# Patient Record
Sex: Male | Born: 1935
Health system: Southern US, Community
[De-identification: ages and names within clinical notes are randomized; demographics above are authoritative.]

## PROBLEM LIST (undated history)

## (undated) DIAGNOSIS — K259 Gastric ulcer, unspecified as acute or chronic, without hemorrhage or perforation: Secondary | ICD-10-CM

## (undated) DIAGNOSIS — K2210 Ulcer of esophagus without bleeding: Secondary | ICD-10-CM

## (undated) DIAGNOSIS — T39395A Adverse effect of other nonsteroidal anti-inflammatory drugs [NSAID], initial encounter: Secondary | ICD-10-CM

## (undated) DIAGNOSIS — K922 Gastrointestinal hemorrhage, unspecified: Secondary | ICD-10-CM

## (undated) DIAGNOSIS — N323 Diverticulum of bladder: Secondary | ICD-10-CM

## (undated) DIAGNOSIS — Z9289 Personal history of other medical treatment: Secondary | ICD-10-CM

## (undated) DIAGNOSIS — K2960 Other gastritis without bleeding: Secondary | ICD-10-CM

## (undated) DIAGNOSIS — D649 Anemia, unspecified: Secondary | ICD-10-CM

## (undated) HISTORY — PX: COLONOSCOPY: SHX174

---

## 2012-03-25 ENCOUNTER — Emergency Department (HOSPITAL_COMMUNITY): Payer: Medicare Other

## 2012-03-25 ENCOUNTER — Emergency Department (HOSPITAL_COMMUNITY)
Admission: EM | Admit: 2012-03-25 | Discharge: 2012-03-25 | Disposition: A | Discharge status: Home / Self Care | Payer: Medicare Other | Attending: Emergency Medicine | Admitting: Emergency Medicine

## 2012-03-25 ENCOUNTER — Encounter (HOSPITAL_COMMUNITY): Payer: Self-pay | Admitting: *Deleted

## 2012-03-25 DIAGNOSIS — R05 Cough: Secondary | ICD-10-CM | POA: Insufficient documentation

## 2012-03-25 DIAGNOSIS — J3489 Other specified disorders of nose and nasal sinuses: Secondary | ICD-10-CM | POA: Insufficient documentation

## 2012-03-25 DIAGNOSIS — J069 Acute upper respiratory infection, unspecified: Secondary | ICD-10-CM

## 2012-03-25 DIAGNOSIS — R059 Cough, unspecified: Secondary | ICD-10-CM | POA: Insufficient documentation

## 2012-03-25 NOTE — ED Provider Notes (Signed)
History     CSN: [id number]  Arrival date & time 03/25/12  1327   First MD Initiated Contact with Patient 03/25/12 1420      Chief Complaint  Patient presents with  . Cough  . Nasal Congestion    (Consider location/radiation/quality/duration/timing/severity/associated sxs/prior treatment) HPI Comments: Patient comes in today with a chief complaint of nasal congestion and productive cough for the past two days.  Patient denies any shortness of breath.  Denies DOE.  He reports that he recently walked all over [place] and did not become SOB.  He denies any CP, PND, orthopnea, or peripheral edema.  No history of CHF.  Patient is a 77 y.o. male presenting with cough. The history is provided by the patient.  Cough This is a new problem. Episode onset: two days ago. The problem has been gradually worsening. The cough is productive of sputum. There has been no fever. Associated symptoms include rhinorrhea. Pertinent negatives include no chest pain, no chills, no sore throat, no shortness of breath and no wheezing. He has tried nothing for the symptoms. He is not a smoker.    History reviewed. No pertinent past medical history.  History reviewed. No pertinent past surgical history.  History reviewed. No pertinent family history.  History  Substance Use Topics  . Smoking status: Never Smoker   . Smokeless tobacco: Not on file  . Alcohol Use: No      Review of Systems  Constitutional: Negative for fever and chills.  HENT: Positive for congestion, rhinorrhea and postnasal drip. Negative for sore throat and sinus pressure.   Respiratory: Positive for cough. Negative for shortness of breath and wheezing.   Cardiovascular: Negative for chest pain, palpitations and leg swelling.  Gastrointestinal: Negative for vomiting.  Neurological: Negative for syncope.    Allergies  Review of patient's allergies indicates no known allergies.  Home Medications   Current Outpatient Rx  Name Route Sig  Dispense Refill  . BC HEADACHE POWDER PO Oral Take 1 packet by mouth daily as needed. For pain.    Marland Kitchen ALKA-SELTZER PLUS COLD PO Oral Take 1 tablet by mouth daily as needed. For cold symptoms.      BP 147/83  Pulse 89  Temp(Src) 98.6 F (37 C) (Oral)  Resp 16  SpO2 96%  Physical Exam  Nursing note and vitals reviewed. Constitutional: He appears well-developed and well-nourished. No distress.  HENT:  Head: Normocephalic and atraumatic.  Right Ear: Hearing, tympanic membrane, external ear and ear canal normal.  Left Ear: Hearing, tympanic membrane, external ear and ear canal normal.  Nose: Rhinorrhea present. Right sinus exhibits no maxillary sinus tenderness and no frontal sinus tenderness. Left sinus exhibits no maxillary sinus tenderness and no frontal sinus tenderness.  Mouth/Throat: Uvula is midline, oropharynx is clear and moist and mucous membranes are normal.  Cardiovascular: Normal rate, regular rhythm and normal heart sounds.        No lower extremity edema  Pulmonary/Chest: Effort normal and breath sounds normal. No respiratory distress. He has no wheezes. He has no rales. He exhibits no tenderness.  Neurological: He is alert.  Skin: Skin is warm and dry. He is not diaphoretic.  Psychiatric: He has a normal mood and affect.    ED Course  Procedures (including critical care time)  Labs Reviewed - No data to display Dg Chest 2 View  03/25/2012  *RADIOLOGY REPORT*  Clinical Data: Cough, nasal congestion  CHEST - 2 VIEW  Comparison: None.  Findings: The lungs  are clear.  Mediastinal contours appear normal. There is mild cardiomegaly present.  There are diffuse degenerative changes throughout the thoracic spine.  IMPRESSION: Cardiomegaly.  No active lung disease.  Original Report Authenticated By: Juline Patch, M.D.     No diagnosis found.  Discussed patient with Dr. Rosalia Hammers who also saw the patient.  MDM  Patient with productive cough and nasal congestion.  Denies CP or SOB.   CXR does not show any active lung disease.  Mild cardiomegaly.  No DOE, PND, orthopnea, peripheral edema.  Lungs CTAB.  Pulse ox 96 on RA.  Therefore, feel that patient can be discharged home with PCP follow up.        Pascal Lux Fowler, PA-C 03/26/12 0210

## 2012-03-25 NOTE — Discharge Instructions (Signed)

## 2012-03-25 NOTE — ED Notes (Signed)
To ed for eval of cough and congestion. Denies cp or sob.

## 2012-03-28 NOTE — ED Provider Notes (Signed)
Patient is 77 y.o. Male with c.o. Uri symptoms and cough.  He does not have fever or chills or dyspnea or chest pain.  NO fever and cxr clear.  Patient with lungs cta.  I agree with Ms. Zenaida Niece Wingen's assessment and plan and consulted and reviewed all informationg with her.   Hilario Quarry, MD 03/28/12 1329

## 2016-03-20 DIAGNOSIS — D649 Anemia, unspecified: Secondary | ICD-10-CM | POA: Diagnosis not present

## 2016-03-20 DIAGNOSIS — N323 Diverticulum of bladder: Secondary | ICD-10-CM | POA: Diagnosis present

## 2016-03-20 DIAGNOSIS — N390 Urinary tract infection, site not specified: Secondary | ICD-10-CM | POA: Diagnosis not present

## 2016-03-20 DIAGNOSIS — N12 Tubulo-interstitial nephritis, not specified as acute or chronic: Secondary | ICD-10-CM | POA: Diagnosis not present

## 2016-03-20 DIAGNOSIS — D62 Acute posthemorrhagic anemia: Secondary | ICD-10-CM | POA: Diagnosis not present

## 2016-03-20 DIAGNOSIS — K5790 Diverticulosis of intestine, part unspecified, without perforation or abscess without bleeding: Secondary | ICD-10-CM | POA: Diagnosis not present

## 2016-03-20 DIAGNOSIS — N329 Bladder disorder, unspecified: Secondary | ICD-10-CM | POA: Diagnosis not present

## 2016-03-20 DIAGNOSIS — K639 Disease of intestine, unspecified: Secondary | ICD-10-CM | POA: Diagnosis present

## 2016-03-20 DIAGNOSIS — R319 Hematuria, unspecified: Secondary | ICD-10-CM | POA: Diagnosis not present

## 2016-03-20 DIAGNOSIS — R309 Painful micturition, unspecified: Secondary | ICD-10-CM | POA: Diagnosis not present

## 2016-03-20 DIAGNOSIS — R195 Other fecal abnormalities: Secondary | ICD-10-CM | POA: Diagnosis not present

## 2016-03-20 DIAGNOSIS — N3289 Other specified disorders of bladder: Secondary | ICD-10-CM | POA: Diagnosis not present

## 2016-03-20 DIAGNOSIS — R310 Gross hematuria: Secondary | ICD-10-CM | POA: Diagnosis not present

## 2016-03-20 DIAGNOSIS — R030 Elevated blood-pressure reading, without diagnosis of hypertension: Secondary | ICD-10-CM | POA: Diagnosis not present

## 2016-03-20 DIAGNOSIS — B964 Proteus (mirabilis) (morganii) as the cause of diseases classified elsewhere: Secondary | ICD-10-CM | POA: Diagnosis not present

## 2016-04-01 DIAGNOSIS — N390 Urinary tract infection, site not specified: Secondary | ICD-10-CM | POA: Diagnosis not present

## 2016-04-01 DIAGNOSIS — N320 Bladder-neck obstruction: Secondary | ICD-10-CM | POA: Diagnosis not present

## 2016-04-01 DIAGNOSIS — N3090 Cystitis, unspecified without hematuria: Secondary | ICD-10-CM | POA: Diagnosis not present

## 2017-07-21 DIAGNOSIS — Z9289 Personal history of other medical treatment: Secondary | ICD-10-CM

## 2017-07-21 HISTORY — DX: Personal history of other medical treatment: Z92.89

## 2017-07-23 ENCOUNTER — Encounter (HOSPITAL_COMMUNITY): Payer: Self-pay | Admitting: Emergency Medicine

## 2017-07-23 ENCOUNTER — Inpatient Hospital Stay (HOSPITAL_COMMUNITY)
Admission: EM | Admit: 2017-07-23 | Discharge: 2017-07-26 | DRG: 378 | Disposition: A | Discharge status: Home / Self Care | Payer: Medicare Other | Attending: Oncology | Admitting: Oncology

## 2017-07-23 DIAGNOSIS — E876 Hypokalemia: Secondary | ICD-10-CM | POA: Diagnosis present

## 2017-07-23 DIAGNOSIS — K2210 Ulcer of esophagus without bleeding: Secondary | ICD-10-CM | POA: Diagnosis not present

## 2017-07-23 DIAGNOSIS — T39015A Adverse effect of aspirin, initial encounter: Secondary | ICD-10-CM | POA: Diagnosis not present

## 2017-07-23 DIAGNOSIS — Z87440 Personal history of urinary (tract) infections: Secondary | ICD-10-CM

## 2017-07-23 DIAGNOSIS — R55 Syncope and collapse: Secondary | ICD-10-CM | POA: Diagnosis not present

## 2017-07-23 DIAGNOSIS — D509 Iron deficiency anemia, unspecified: Secondary | ICD-10-CM

## 2017-07-23 DIAGNOSIS — K259 Gastric ulcer, unspecified as acute or chronic, without hemorrhage or perforation: Secondary | ICD-10-CM

## 2017-07-23 DIAGNOSIS — K250 Acute gastric ulcer with hemorrhage: Secondary | ICD-10-CM | POA: Diagnosis not present

## 2017-07-23 DIAGNOSIS — Z7982 Long term (current) use of aspirin: Secondary | ICD-10-CM

## 2017-07-23 DIAGNOSIS — R404 Transient alteration of awareness: Secondary | ICD-10-CM | POA: Diagnosis not present

## 2017-07-23 DIAGNOSIS — K922 Gastrointestinal hemorrhage, unspecified: Secondary | ICD-10-CM | POA: Diagnosis not present

## 2017-07-23 DIAGNOSIS — D500 Iron deficiency anemia secondary to blood loss (chronic): Secondary | ICD-10-CM | POA: Diagnosis not present

## 2017-07-23 DIAGNOSIS — R531 Weakness: Secondary | ICD-10-CM | POA: Diagnosis not present

## 2017-07-23 HISTORY — DX: Anemia, unspecified: D64.9

## 2017-07-23 LAB — HEPATIC FUNCTION PANEL
ALT: 8 U/L — AB (ref 17–63)
AST: 19 U/L (ref 15–41)
Albumin: 3 g/dL — ABNORMAL LOW (ref 3.5–5.0)
Alkaline Phosphatase: 64 U/L (ref 38–126)
BILIRUBIN INDIRECT: 0.5 mg/dL (ref 0.3–0.9)
BILIRUBIN TOTAL: 0.6 mg/dL (ref 0.3–1.2)
Bilirubin, Direct: 0.1 mg/dL (ref 0.1–0.5)
Total Protein: 7.2 g/dL (ref 6.5–8.1)

## 2017-07-23 LAB — IRON AND TIBC
IRON: 10 ug/dL — AB (ref 45–182)
SATURATION RATIOS: 3 % — AB (ref 17.9–39.5)
TIBC: 326 ug/dL (ref 250–450)
UIBC: 316 ug/dL

## 2017-07-23 LAB — BASIC METABOLIC PANEL
ANION GAP: 7 (ref 5–15)
BUN: 7 mg/dL (ref 6–20)
CHLORIDE: 108 mmol/L (ref 101–111)
CO2: 25 mmol/L (ref 22–32)
Calcium: 8.7 mg/dL — ABNORMAL LOW (ref 8.9–10.3)
Creatinine, Ser: 1.12 mg/dL (ref 0.61–1.24)
GFR calc Af Amer: >60 mL/min (ref >60)
GFR calc non Af Amer: >60 mL/min (ref >60)
GLUCOSE: 125 mg/dL — AB (ref 65–99)
POTASSIUM: 3.3 mmol/L — AB (ref 3.5–5.1)
Sodium: 140 mmol/L (ref 135–145)

## 2017-07-23 LAB — URINALYSIS, ROUTINE W REFLEX MICROSCOPIC
Bilirubin Urine: NEGATIVE
Glucose, UA: NEGATIVE mg/dL
Hgb urine dipstick: NEGATIVE
KETONES UR: NEGATIVE mg/dL
NITRITE: NEGATIVE
PROTEIN: NEGATIVE mg/dL
Specific Gravity, Urine: 1.017 (ref 1.005–1.030)
pH: 5 (ref 5.0–8.0)

## 2017-07-23 LAB — CBC
HEMATOCRIT: 23.1 % — AB (ref 39.0–52.0)
HEMOGLOBIN: 6.2 g/dL — AB (ref 13.0–17.0)
MCH: 16.1 pg — AB (ref 26.0–34.0)
MCHC: 26.8 g/dL — AB (ref 30.0–36.0)
MCV: 59.8 fL — AB (ref 78.0–100.0)
Platelets: 343 10*3/uL (ref 150–400)
RBC: 3.86 MIL/uL — ABNORMAL LOW (ref 4.22–5.81)
RDW: 19.3 % — ABNORMAL HIGH (ref 11.5–15.5)
WBC: 7.6 10*3/uL (ref 4.0–10.5)

## 2017-07-23 LAB — PROTIME-INR
INR: 1.09
Prothrombin Time: 14.1 seconds (ref 11.4–15.2)

## 2017-07-23 LAB — POC OCCULT BLOOD, ED: FECAL OCCULT BLD: POSITIVE — AB

## 2017-07-23 LAB — CBG MONITORING, ED: GLUCOSE-CAPILLARY: 117 mg/dL — AB (ref 65–99)

## 2017-07-23 LAB — PREPARE RBC (CROSSMATCH)

## 2017-07-23 LAB — FERRITIN: FERRITIN: 4 ng/mL — AB (ref 24–336)

## 2017-07-23 LAB — ABO/RH: ABO/RH(D): O POS

## 2017-07-23 LAB — I-STAT TROPONIN, ED: Troponin i, poc: 0.01 ng/mL (ref 0.00–0.08)

## 2017-07-23 LAB — LIPASE, BLOOD: Lipase: 22 U/L (ref 11–51)

## 2017-07-23 LAB — APTT: aPTT: 27 seconds (ref 24–36)

## 2017-07-23 MED ORDER — SODIUM CHLORIDE 0.9 % IV BOLUS (SEPSIS)
1000.0000 mL | Freq: Once | INTRAVENOUS | Status: AC
  Administered 2017-07-23: 1000 mL via INTRAVENOUS

## 2017-07-23 MED ORDER — SODIUM CHLORIDE 0.9% FLUSH
3.0000 mL | Freq: Two times a day (BID) | INTRAVENOUS | Status: DC
  Administered 2017-07-24 – 2017-07-26 (×4): 3 mL via INTRAVENOUS

## 2017-07-23 MED ORDER — SODIUM CHLORIDE 0.9 % IV SOLN
INTRAVENOUS | Status: DC
  Administered 2017-07-24 – 2017-07-25 (×2): via INTRAVENOUS

## 2017-07-23 MED ORDER — SODIUM CHLORIDE 0.9 % IV SOLN
8.0000 mg/h | INTRAVENOUS | Status: DC
  Administered 2017-07-23: 8 mg/h via INTRAVENOUS
  Filled 2017-07-23: qty 80

## 2017-07-23 MED ORDER — PANTOPRAZOLE SODIUM 40 MG IV SOLR
40.0000 mg | Freq: Two times a day (BID) | INTRAVENOUS | Status: AC
  Administered 2017-07-23 – 2017-07-25 (×4): 40 mg via INTRAVENOUS
  Filled 2017-07-23 (×4): qty 40

## 2017-07-23 MED ORDER — SODIUM CHLORIDE 0.9 % IV SOLN: Freq: Once | INTRAVENOUS | Status: DC

## 2017-07-23 MED ORDER — PANTOPRAZOLE SODIUM 40 MG IV SOLR
40.0000 mg | Freq: Once | INTRAVENOUS | Status: AC
  Administered 2017-07-23: 40 mg via INTRAVENOUS
  Filled 2017-07-23: qty 40

## 2017-07-23 NOTE — ED Notes (Signed)
Attempted to call report

## 2017-07-23 NOTE — ED Provider Notes (Signed)
[facility] DEPT Provider Note   CSN: [id number] Arrival date & time: 07/23/17  1317     History   Chief Complaint Chief Complaint  Patient presents with  . Loss of Consciousness    HPI Roberto Owen is a 82 y.o. male who presents emergency Department with chief complaint syncope. The patient states that he was eating with a friend at [employer] today when he started to feel very dizzy. He states that he put his head down on the table and the next thing he knows he ended up on the floor with 3 of the managers standing around him in a crowd of people. He states he has had a syncopal event in the past. He denies racing or skipping in his heart, chest pain, shortness of breath. He is otherwise extremely active, he walks every day to his favorite restaurant for lunch and does yard work and housework. The patient also states that he has a previous history of a GI bleed about 6 years ago in [place]. He was admitted and transfused and had a workup that revealed no area of bleeding in the cut. He states that he took iron and has been well since that time. Patient states he takes no medicine except for Goody's powders which she takes 2 daily every day. He states that his parents lived into their 23s and they did the same and he continues to plan on taking it because it makes him feel better.  HPI  History reviewed. No pertinent past medical history.  There are no active problems to display for this patient.   History reviewed. No pertinent surgical history.     Home Medications    Prior to Admission medications   Medication Sig Start Date End Date Taking? Authorizing Provider  Aspirin-Salicylamide-Caffeine (BC HEADACHE POWDER PO) Take 1 packet by mouth daily as needed. For pain.    [provider]  Chlorphen-Phenyleph-ASA (ALKA-SELTZER PLUS COLD PO) Take 1 tablet by mouth daily as needed. For cold symptoms.    [provider]    Family History No family history  on file.  Social History Social History  Substance Use Topics  . Smoking status: Never Smoker  . Smokeless tobacco: Not on file  . Alcohol use No     Allergies   Patient has no known allergies.   Review of Systems Review of Systems  Ten systems reviewed and are negative for acute change, except as noted in the HPI.   Physical Exam Updated Vital Signs BP 140/73   Pulse 69   Resp 20   SpO2 100%   Physical Exam  Constitutional: He is oriented to person, place, and time. He appears well-developed and well-nourished. No distress.  HENT:  Head: Normocephalic and atraumatic.  Pale conjunctiva  Eyes: Conjunctivae are normal. No scleral icterus.  Neck: Normal range of motion. Neck supple.  Cardiovascular: Normal rate, regular rhythm and normal heart sounds.   Pulmonary/Chest: Effort normal and breath sounds normal. No respiratory distress.  Abdominal: Soft. He exhibits no distension. There is no tenderness.  Musculoskeletal: He exhibits no edema.  Neurological: He is alert and oriented to person, place, and time.  Skin: Skin is warm and dry. He is not diaphoretic.  Psychiatric: His behavior is normal.  Nursing note and vitals reviewed.    ED Treatments / Results  Labs (all labs ordered are listed, but only abnormal results are displayed) Labs Reviewed  BASIC METABOLIC PANEL - Abnormal; Notable for the following:  Result Value   Potassium 3.3 (*)    Glucose, Bld 125 (*)    Calcium 8.7 (*)    All other components within normal limits  CBC - Abnormal; Notable for the following:    RBC 3.86 (*)    Hemoglobin 6.2 (*)    HCT 23.1 (*)    MCV 59.8 (*)    MCH 16.1 (*)    MCHC 26.8 (*)    RDW 19.3 (*)    All other components within normal limits  HEPATIC FUNCTION PANEL - Abnormal; Notable for the following:    Albumin 3.0 (*)    ALT 8 (*)    All other components within normal limits  CBG MONITORING, ED - Abnormal; Notable for the following:    Glucose-Capillary  117 (*)    All other components within normal limits  LIPASE, BLOOD  URINALYSIS, ROUTINE W REFLEX MICROSCOPIC  CBG MONITORING, ED  I-STAT TROPONIN, ED    EKG  EKG Interpretation  Date/Time:  Friday July 23 2017 13:26:47 EDT Ventricular Rate:  80 PR Interval:    QRS Duration: 87 QT Interval:  440 QTC Calculation: 447 R Axis:   13 Text Interpretation:  Atrial-paced complexes Ventricular bigeminy Borderline T abnormalities, inferior leads No STEMI.  Confirmed by Nanda Quinton [phone]) on 07/23/2017 1:32:35 PM Also confirmed by Nanda Quinton [phone]), editor Laurena Spies [phone])  on 07/23/2017 1:42:57 PM       Radiology No results found.  Procedures .Critical Care Performed by: Margarita Mail Authorized by: Margarita Mail   Critical care provider statement:    Critical care time (minutes):  60   Critical care was necessary to treat or prevent imminent or life-threatening deterioration of the following conditions:  Circulatory failure   Critical care was time spent personally by me on the following activities:  Development of treatment plan with patient or surrogate, discussions with consultants, evaluation of patient's response to treatment, examination of patient, interpretation of cardiac output measurements, obtaining history from patient or surrogate, ordering and performing treatments and interventions, ordering and review of laboratory studies, ordering and review of radiographic studies, pulse oximetry and re-evaluation of patient's condition   (including critical care time)  Medications Ordered in ED Medications - No data to display   Initial Impression / Assessment and Plan / ED Course  I have reviewed the triage vital signs and the nursing notes.  Pertinent labs & imaging results that were available during my care of the patient were reviewed by me and considered in my medical decision making (see chart for details).     Patient with active GI bleed, fecal  occult test is positive. Patient begun on Protonix bolus and drip, made nothing by mouth. Type and screen with transfusion ordered. I have placed a consult to gi and patient will need admission. Pt stable in ED with no significant deterioration in condition.   Final Clinical Impressions(s) / ED Diagnoses   Final diagnoses:  Gastrointestinal hemorrhage, unspecified gastrointestinal hemorrhage type  Syncope and collapse    New Prescriptions New Prescriptions   No medications on file     Margarita Mail, PA-C 07/23/17 1716    Margette Fast, MD 07/23/17 [phone]

## 2017-07-23 NOTE — H&P (Signed)
Date: 07/23/2017               Patient Name:  Roberto Owen MRN: [id number]  DOB: 21-May-1935 Age / Sex: 82 y.o., male   PCP: System, Pcp Not In         Medical Service: Internal Medicine Teaching Service         Attending Physician: Dr. Oval Linsey, MD    First Contact: Dr. Johny Chess Pager: [phone]  Second Contact: Dr. Juleen China Pager: [phone]       After Hours (After 5p/  First Contact Pager: [phone]  weekends / holidays): Second Contact Pager: [phone]   Chief Complaint: Syncopal episode   History of Present Illness: Roberto Owen is an 82 yo M with history of anemia who presented to the ED after a syncopal event and found to be anemic.   He was eating at a large meal at a restaurant when, while sitting, he became "woozy and tired", felt light-headed and briefly lost consciousness. He reports waking up without any confusion, had one episode of non-bloody, non-bilious emesis and had resolution of his symptoms. Denies palpitations leading to event, denies sx beginning with a postural change. There were no reported jerking movements by witnesses. Overall he reports being in good health except when he does not eat regularly- he typically only eats out at restaurants or when someone cooks for him. He has not had dark or bloody bowel movements, no hematuria. He states he tries to walk regularly and exercise. Prior to moving to [facility] around Oct/Nov 2017, he was walking around 2 miles per day and playing golf and he notes he has not been as active since his move but attributes this to only when he skips a meal. He takes [employer] Earing Powders twice a day starting around 25 years ago.   Of note, he states around 1 yr ago while living in [place] he was hospitalized for a UTI and had an episode of gross hematuria. He was found to be anemic at that time, did not require transfusion. Per the pt and daughter, he had a negative workup including colonoscopy and other testing that was negative. He was  discharged on a course of Fe and states he was told his anemia had resolved on follow up.   In the ED, HR 83, BP 156/73, 99% on RA. Labs were remarkable for Hgb 6.2, MCV 59.8, WBC 7.6, Plts 343. Na 140, K 3.3, Cl 108, CO2 125, BUN 7, Cr 1.12. Alb 3.0, TP 7.2, AST/ALT wnl. Fecal occult blood test was positive. He was type and screened and transfused 2 U pRBCs. He was started on IV PPI and IVF, GI was consulted and he was admitted for further management.      Meds:  Current Meds  Medication Sig  . Aspirin-Salicylamide-Caffeine (BC HEADACHE POWDER PO) Take 1 packet by mouth 2 (two) times daily. For pain.   . Multiple Vitamins-Minerals (ADULT ONE DAILY GUMMIES) CHEW Chew 2 tablets by mouth daily.     Allergies: Allergies as of 07/23/2017  . (No Known Allergies)   Past Medical History:  Diagnosis Date  . Anemia     Family History:  Family History  Problem Relation Age of Onset  . Arthritis Mother   . Arthritis Father   . Heart disease Neg Hx   . Kidney disease Neg Hx   . Diabetes Neg Hx      Social History:  Social History  Substance Use Topics  . Smoking status:  Never Smoker  . Smokeless tobacco: Not on file  . Alcohol use No     Review of Systems: A complete ROS was negative except as per HPI.   Physical Exam: Blood pressure (!) 140/95, pulse 80, resp. rate (!) 25, SpO2 99 %. Physical Exam  Constitutional: He is oriented to person, place, and time. He appears well-developed and well-nourished.  Elderly gentleman resting in bed in no acute distress   HENT:  Head: Normocephalic and atraumatic.  Mouth/Throat: Oropharynx is clear and moist.  Poor dentition   Eyes: Pupils are equal, round, and reactive to light. EOM are normal.  Pale conjuctiva  Neck: Neck supple. No tracheal deviation present.  Cardiovascular: Normal rate, regular rhythm, normal heart sounds and intact distal pulses.   Pulmonary/Chest: Effort normal and breath sounds normal. No respiratory distress.  He has no wheezes.  Abdominal: Soft. Bowel sounds are normal. He exhibits no distension. There is no tenderness. There is no guarding.  Musculoskeletal: He exhibits no edema.  Lymphadenopathy:    He has no cervical adenopathy.  Neurological: He is alert and oriented to person, place, and time. No cranial nerve deficit. He exhibits normal muscle tone.     EKG: personally reviewed my interpretation is ventricular bigeminy without evidence of ischemia.    Assessment & Plan by Problem:  1.Suspected GI bleed of unknown source with severe anemia Syncopal episode   Pt presenting s/p syncopal episode and found to have severe anemia, Hgb 6.2, and positive occult blood test. He is hemodynamically stable with no specific complaints. He has had a workup for anemia in the past, though details unavailable pt reports normal colonoscopy. At this time, syncopal episode and anemia due to blood loss from a GI source is most likely explanation for his current presentation, especially with hx of significant use of Goody powder. Syncope may have also been vasovagal with preceding prodrome. GI consulted, appreciate their recommendations. Received 2 U pRBCs in ED.  --2 IVs, Type and Cross, transfuse for Hgb <7, monitor vital signs  --IV Pantoprazole 40 mg BID  --IVF --Iron Studies --H&H post transfusion  --NPO midnight, EGD tomorrow  --Orthostatic vitals       Dispo: Admit patient to Observation with expected length of stay less than 2 midnights.  Signed: Tawny Asal, MD 07/23/2017, 6:17 PM  Pager: [phone]

## 2017-07-23 NOTE — ED Triage Notes (Signed)
Pt BIB EMS from [facility] for syncopal episode. Per EMS pt became weak, diaphoretic, and had LOC while sitting at the table; when pt came to, had an episode of vomit and reported lower abd pain. Pt A&Ox4; resp e/u; No injuries and nad at this time.   Pt states he hasn't been eating well or having an appetite.

## 2017-07-24 ENCOUNTER — Encounter (HOSPITAL_COMMUNITY): Payer: Self-pay | Admitting: *Deleted

## 2017-07-24 DIAGNOSIS — K250 Acute gastric ulcer with hemorrhage: Secondary | ICD-10-CM | POA: Diagnosis not present

## 2017-07-24 DIAGNOSIS — D500 Iron deficiency anemia secondary to blood loss (chronic): Secondary | ICD-10-CM | POA: Diagnosis not present

## 2017-07-24 DIAGNOSIS — Z791 Long term (current) use of non-steroidal anti-inflammatories (NSAID): Secondary | ICD-10-CM

## 2017-07-24 DIAGNOSIS — T39015A Adverse effect of aspirin, initial encounter: Secondary | ICD-10-CM | POA: Diagnosis present

## 2017-07-24 DIAGNOSIS — T39395S Adverse effect of other nonsteroidal anti-inflammatory drugs [NSAID], sequela: Secondary | ICD-10-CM | POA: Diagnosis not present

## 2017-07-24 DIAGNOSIS — K228 Other specified diseases of esophagus: Secondary | ICD-10-CM | POA: Diagnosis not present

## 2017-07-24 DIAGNOSIS — R55 Syncope and collapse: Secondary | ICD-10-CM | POA: Diagnosis not present

## 2017-07-24 DIAGNOSIS — Z9889 Other specified postprocedural states: Secondary | ICD-10-CM | POA: Diagnosis not present

## 2017-07-24 DIAGNOSIS — K219 Gastro-esophageal reflux disease without esophagitis: Secondary | ICD-10-CM | POA: Diagnosis not present

## 2017-07-24 DIAGNOSIS — K2951 Unspecified chronic gastritis with bleeding: Secondary | ICD-10-CM | POA: Diagnosis not present

## 2017-07-24 DIAGNOSIS — B9681 Helicobacter pylori [H. pylori] as the cause of diseases classified elsewhere: Secondary | ICD-10-CM | POA: Diagnosis not present

## 2017-07-24 DIAGNOSIS — R195 Other fecal abnormalities: Secondary | ICD-10-CM | POA: Diagnosis not present

## 2017-07-24 DIAGNOSIS — Z87440 Personal history of urinary (tract) infections: Secondary | ICD-10-CM | POA: Diagnosis not present

## 2017-07-24 DIAGNOSIS — K2210 Ulcer of esophagus without bleeding: Secondary | ICD-10-CM | POA: Diagnosis not present

## 2017-07-24 DIAGNOSIS — E876 Hypokalemia: Secondary | ICD-10-CM | POA: Diagnosis not present

## 2017-07-24 DIAGNOSIS — Z7982 Long term (current) use of aspirin: Secondary | ICD-10-CM | POA: Diagnosis not present

## 2017-07-24 DIAGNOSIS — D649 Anemia, unspecified: Secondary | ICD-10-CM | POA: Diagnosis not present

## 2017-07-24 DIAGNOSIS — K259 Gastric ulcer, unspecified as acute or chronic, without hemorrhage or perforation: Secondary | ICD-10-CM | POA: Diagnosis not present

## 2017-07-24 DIAGNOSIS — K922 Gastrointestinal hemorrhage, unspecified: Secondary | ICD-10-CM | POA: Diagnosis not present

## 2017-07-24 LAB — CBC
HCT: 24.3 % — ABNORMAL LOW (ref 39.0–52.0)
HCT: 27.5 % — ABNORMAL LOW (ref 39.0–52.0)
HCT: 25.3 % — ABNORMAL LOW (ref 39.0–52.0)
HEMOGLOBIN: 7.3 g/dL — AB (ref 13.0–17.0)
Hemoglobin: 7 g/dL — ABNORMAL LOW (ref 13.0–17.0)
Hemoglobin: 7.9 g/dL — ABNORMAL LOW (ref 13.0–17.0)
MCH: 18.4 pg — AB (ref 26.0–34.0)
MCH: 18.5 pg — ABNORMAL LOW (ref 26.0–34.0)
MCH: 18.5 pg — AB (ref 26.0–34.0)
MCHC: 28.8 g/dL — AB (ref 30.0–36.0)
MCHC: 28.7 g/dL — AB (ref 30.0–36.0)
MCHC: 28.9 g/dL — ABNORMAL LOW (ref 30.0–36.0)
MCV: 63.9 fL — AB (ref 78.0–100.0)
MCV: 64.3 fL — ABNORMAL LOW (ref 78.0–100.0)
MCV: 64.2 fL — ABNORMAL LOW (ref 78.0–100.0)
PLATELETS: 241 10*3/uL (ref 150–400)
PLATELETS: 258 10*3/uL (ref 150–400)
PLATELETS: 271 10*3/uL (ref 150–400)
RBC: 3.8 MIL/uL — ABNORMAL LOW (ref 4.22–5.81)
RBC: 4.28 MIL/uL (ref 4.22–5.81)
RBC: 3.94 MIL/uL — AB (ref 4.22–5.81)
RDW: 22.8 % — AB (ref 11.5–15.5)
RDW: 22.8 % — AB (ref 11.5–15.5)
RDW: 22.6 % — ABNORMAL HIGH (ref 11.5–15.5)
WBC: 5 10*3/uL (ref 4.0–10.5)
WBC: 4.8 10*3/uL (ref 4.0–10.5)
WBC: 5.2 10*3/uL (ref 4.0–10.5)

## 2017-07-24 LAB — BASIC METABOLIC PANEL
Anion gap: 4 — ABNORMAL LOW (ref 5–15)
BUN: 6 mg/dL (ref 6–20)
CALCIUM: 8 mg/dL — AB (ref 8.9–10.3)
CHLORIDE: 109 mmol/L (ref 101–111)
CO2: 27 mmol/L (ref 22–32)
CREATININE: 0.84 mg/dL (ref 0.61–1.24)
GFR calc non Af Amer: >60 mL/min (ref >60)
Glucose, Bld: 90 mg/dL (ref 65–99)
Potassium: 3.4 mmol/L — ABNORMAL LOW (ref 3.5–5.1)
Sodium: 140 mmol/L (ref 135–145)

## 2017-07-24 LAB — PREPARE RBC (CROSSMATCH)

## 2017-07-24 MED ORDER — SODIUM CHLORIDE 0.9 % IV SOLN: Freq: Once | INTRAVENOUS | Status: DC

## 2017-07-24 MED ORDER — POLYVINYL ALCOHOL 1.4 % OP SOLN
1.0000 [drp] | OPHTHALMIC | Status: DC | PRN
  Administered 2017-07-24 – 2017-07-26 (×3): 1 [drp] via OPHTHALMIC
  Filled 2017-07-24: qty 15

## 2017-07-24 NOTE — Consult Note (Signed)
Attending physician's note   I have taken a history, examined the patient and reviewed the chart. I agree with the Advanced Practitioner's note, impression and recommendations.  82 year old male with history of iron deficiency anemia admitted with presyncope, worsening anemia and heme positive stool. Per patient he had EGD, colonoscopy and extensive evaluation including urologic for iron deficiency anemia. Await reports from [facility].  Patient denies any overt GI bleed. He has history of chronic NSAID use. We will plan for EGD tomorrow morning to exclude gastric/peptic ulcer disease Continue to monitor hemoglobin every 12 hours and transfuse to maintain hemoglobin greater than 7 Clear liquids and nothing by mouth after midnight Continue PPI twice daily  K Denzil Magnuson, MD [phone] Mon-Fri 8a-5p [phone] after 5p, weekends, holidays  Referring Provider: Internal Medicine Teaching Service  Primary Care Physician:  System, Pcp Not In Primary Gastroenterologist:   Unassigned  Reason for Consultation:  Anemia, heme positive stools  ASSESSMENT AND PLAN:    1. 82 yo male with symptomatic iron deficiency anemia, heme positive stools in setting of chronic BID NSAIDs. Hgb 6.2. No overt GI bleeding. No GI symptoms. Sounds like he underwent inpatient anemia workup in [place] 11 months ago. He describes negative EGD and colonoscopy. Records requested. -discussed potential side effects of BC powders with patient and daughter.  -EGD tomorrow. The risks and benefits of EGD were discussed and the patient agrees to proceed.  -Await records from [place]. If colonoscopy was complete with well prepped colon then repeat colonoscopy shouldn't be necessary. He may need small bowel evaluation at some point.  -hgb up less than a gram after 2 units of blood. He may need another unit.    HPI: Roberto Owen is a 82 y.o. male from [place] in [place] visiting daughter. who presented to ED  yesterday afternoon after ? syncopal episode. Patient had eaten a large meal, subsequently felt weak. He put his head down on the table, EMS was called. Patient says he remembers everything and didn't lose consciousness. No SOB or chest pain. In ED he was found to have a hgb of 6.2. FOBT +. Given 2 units of blood.   Patient [place]op[place] in [place]/ hematuria. Found to be anemic at the time. Apparently had a negative colonoscopy and EGD in [place] 11 months while hospitalized with urinary sx. Apparently anemic as well given endoscopic procedures. Patient says he was treated with some anemia pills and at time of check up a few weeks later everything was fine. He has taken [facility] powders twice daily for 25 years. He has not had any overt GI bleeding. No abdominal pain. No nausea / no vomiting. No weight loss.    Past Medical History:  Diagnosis Date  . Anemia     History reviewed. No pertinent surgical history.  Prior to Admission medications   Medication Sig Start Date End Date Taking? Authorizing Provider  Aspirin-Salicylamide-Caffeine (BC HEADACHE POWDER PO) Take 1 packet by mouth 2 (two) times daily. For pain.    Yes [provider]  Multiple Vitamins-Minerals (ADULT ONE DAILY GUMMIES) CHEW Chew 2 tablets by mouth daily.   Yes [provider]    Current Facility-Administered Medications  Medication Dose Route Frequency Provider Last Rate Last Dose  . 0.9 %  sodium chloride infusion   Intravenous Continuous Jule Ser, DO      . 0.9 %  sodium chloride infusion   Intravenous Once Margarita Mail, PA-C      .  pantoprazole (PROTONIX) injection 40 mg  40 mg Intravenous Q12H Jule Ser, DO   40 mg at 07/23/17 2030  . polyvinyl alcohol (LIQUIFILM TEARS) 1.4 % ophthalmic solution 1 drop  1 drop Both Eyes PRN Oval Linsey, MD   1 drop at 07/24/17 0509  . sodium chloride flush (NS) 0.9 % injection 3 mL  3 mL Intravenous Q12H Jule Ser, DO         Allergies as of 07/23/2017  . (No Known Allergies)    Family History  Problem Relation Age of Onset  . Arthritis Mother   . Arthritis Father   . Heart disease Neg Hx   . Kidney disease Neg Hx   . Diabetes Neg Hx     Social History   Social History  . Marital status: Divorced    Spouse name: N/A  . Number of children: N/A  . Years of education: N/A   Occupational History  . Not on file.   Social History Main Topics  . Smoking status: Never Smoker  . Smokeless tobacco: Never Used  . Alcohol use No  . Drug use: No  . Sexual activity: Not on file   Other Topics Concern  . Not on file   Social History Narrative  . No narrative on file    Review of Systems: All systems reviewed and negative except where noted in HPI.  Physical Exam: Vital signs in last 24 hours: Temp:  [98 F (36.7 C)-98.6 F (37 C)] 98.6 F (37 C) (08/04 0506) Pulse Rate:  [37-102] 77 (08/04 0506) Resp:  [15-25] 20 (08/04 0506) BP: (111-150)/(48-95) 134/83 (08/04 0506) SpO2:  [97 %-100 %] 100 % (08/04 0506) Weight:  [223 lb 4.8 oz (101.3 kg)] 223 lb 4.8 oz (101.3 kg) (08/03 1847) Last BM Date: 07/23/17 General:   Alert, well-developed,  Black male in NAD Psych:  Pleasant, cooperative. Normal mood and affect. Eyes:  Pupils equal, sclera clear, no icterus.   Conjunctiva pink. Ears:  Normal auditory acuity. Nose:  No deformity, discharge,  or lesions. Neck:  Supple; no masses Lungs:  Clear throughout to auscultation.   No wheezes, crackles, or rhonchi.  Heart:  Regular rate, irreg rhythm; no murmurs Abdomen:  Soft, non-distended, nontender, BS active, no palp mass    Rectal:  Deferred  Msk:  Symmetrical without gross deformities. . Pulses:  Normal pulses noted. Neurologic:  Alert and  oriented x4;  grossly normal neurologically. Skin:  Intact without significant lesions or rashes..   Intake/Output from previous day: 08/03 0701 - 08/04 0700 In: 2981.7 [I.V.:58.3; Blood:690; IV  Piggyback:2233.3] Out: 400 [Urine:400] Intake/Output this shift: No intake/output data recorded.  Lab Results:  Recent Labs  07/23/17 1328 07/24/17 0711  WBC 7.6 5.0  HGB 6.2* 7.0*  HCT 23.1* 24.3*  PLT 343 241   BMET  Recent Labs  07/23/17 1328 07/24/17 0711  NA 140 140  K 3.3* 3.4*  CL 108 109  CO2 25 27  GLUCOSE 125* 90  BUN 7 6  CREATININE 1.12 0.84  CALCIUM 8.7* 8.0*   LFT  Recent Labs  07/23/17 1356  PROT 7.2  ALBUMIN 3.0*  AST 19  ALT 8*  ALKPHOS 64  BILITOT 0.6  BILIDIR 0.1  IBILI 0.5   PT/INR  Recent Labs  07/23/17 1328  LABPROT 14.1  INR 1.09    Tye Savoy, NP-C @  07/24/2017, 9:29 AM  Pager number [phone]

## 2017-07-24 NOTE — Progress Notes (Signed)
   Subjective: Roberto Owen was admitted yesterday, received 2 U pRBCs overnight, no acute events. He reports feeling well overall with no complaints this morning and notes he has enjoyed the friendly staff.   Objective:  Vital signs in last 24 hours: Vitals:   07/23/17 2138 07/24/17 0109 07/24/17 0143 07/24/17 0506  BP: (!) 139/92 (!) 138/52 (!) 150/61 134/83  Pulse: 80 69 82 77  Resp: 16 18 18 20   Temp: 98.4 F (36.9 C) 98.4 F (36.9 C) 98.1 F (36.7 C) 98.6 F (37 C)  TempSrc: Oral Oral Oral Oral  SpO2: 100% 100% 100% 100%  Weight:      Height:       Physical Exam  Constitutional: He is oriented to person, place, and time. He appears well-developed and well-nourished.  Elderly gentleman comfortably resting in bed in no acute distress   HENT:  Head: Normocephalic and atraumatic.  Cardiovascular: Normal rate and regular rhythm.   Pulmonary/Chest: Effort normal. No respiratory distress.  Abdominal: Soft. He exhibits no distension. There is no tenderness. There is no guarding.  Musculoskeletal: Normal range of motion.  Neurological: He is alert and oriented to person, place, and time.  Skin: Skin is warm and dry.     Assessment/Plan:  1.Suspected GI bleed of unknown source with severe anemia Syncopal episode   Pt presenting s/p syncopal episode and found to have severe anemia, Hgb 6.2, and positive occult blood test with significant Goody powder use. He is hemodynamically stable with no specific complaints. He has had a workup for anemia in the past, though details unavailable pt reports normal colonoscopy. Syncopal episode may have been vasovagal in nature with reported prodrome, following large meal. GI consulted, appreciate their recommendations. Received 2 U pRBCs with increase in Hgb to 7.0. Iron studies consistent with IDA. This is likely a chronic process rather than acute.   --2 IVs, Type and Cross, transfuse for Hgb <7, monitor vital signs  --CBC q8hr  --Continue IV  Pantoprazole 40 mg BID  --Continue IVF 75 ml/hr --F/u EGD results and GI recommendations --Attempt to obtain outside records of prior workup   Dispo: Anticipated discharge in approximately 1-2 day(s).   Tawny Asal, MD 07/24/2017, 10:34 AM Pager: [phone]

## 2017-07-24 NOTE — H&P (Signed)
Internal Medicine Attending Admission Note Date: 07/24/2017  Patient name: Roberto Owen Medical record number: [id number] Date of birth: [birth year] Age: 82 y.o. Gender: male  I saw and evaluated the patient. I reviewed the resident's note and I agree with the resident's findings and plan as documented in the resident's note.  Chief Complaint(s): Syncope.  History - key components related to admission:  Mr. Roberto Owen is an 82 year old man with a previous history of anemia diagnosed one year ago who was in his usual state of health until the evening of admission when, while sitting after eating a big meal at [facility], he felt fatigued, lightheadedness and briefly lost consciousness. Upon awakening he was not confused but did have an episode of nonbloody and non-bilious emesis. Just prior to the event he denies feeling any chest pain, palpitations, shortness of breath, or headaches. In the emergency department he was noted to have a hemoglobin of 6.2 with an MCV of 59.8 and a fecal occult blood test card that was positive. He was therefore admitted to the internal medicine teaching service for further evaluation and care.  When seen on rounds the morning after admission he had absolutely no complaints and stated he felt fine. He did fill in some prior history that approximately one year ago he had a similar episode and was found to be anemic. He apparently underwent a colonoscopy and a cystoscopy, both of which he tells [place] were unremarkable. He was placed on iron supplementation and one month later was told he was fine. Also of historical importance is the fact that he's been taking Goody powders twice daily for nearly 25 years.  Physical Exam - key components related to admission:  Vitals:   07/24/17 0109 07/24/17 0143 07/24/17 0506 07/24/17 1353  BP: (!) 138/52 (!) 150/61 134/83 95/72  Pulse: 69 82 77 66  Resp: 18 18 20 14   Temp: 98.4 F (36.9 C) 98.1 F (36.7 C) 98.6 F (37 C) 97.9 F  (36.6 C)  TempSrc: Oral Oral Oral Oral  SpO2: 100% 100% 100% 99%  Weight:      Height:       Gen.: Well-developed, well-nourished, man lying comfortably in bed in no acute distress. Abdomen: Soft, nontender  Lab results:  Basic Metabolic Panel:  Recent Labs  07/23/17 1328 07/24/17 0711  NA 140 140  K 3.3* 3.4*  CL 108 109  CO2 25 27  GLUCOSE 125* 90  BUN 7 6  CREATININE 1.12 0.84  CALCIUM 8.7* 8.0*   Liver Function Tests:  Recent Labs  07/23/17 1356  AST 19  ALT 8*  ALKPHOS 64  BILITOT 0.6  PROT 7.2  ALBUMIN 3.0*    Recent Labs  07/23/17 1356  LIPASE 22   CBC:  Recent Labs  07/24/17 0711 07/24/17 1310  WBC 5.0 4.8  HGB 7.0* 7.9*  HCT 24.3* 27.5*  MCV 63.9* 64.3*  PLT 241 258   CBG:  Recent Labs  07/23/17 1333  GLUCAP 117*   Anemia Panel:  Recent Labs  07/23/17 1807  FERRITIN 4*  TIBC 326  IRON 10*   Coagulation:  Recent Labs  07/23/17 1328  INR 1.09   Urinalysis:  Clear, yellow, specific gravity 1.017, pH 5.0, negative protein, negative nitrate, large leukocytes, 0-5 red blood cells per high-power field, 6-30 white blood cells per high-power field.  Misc. Labs:  Fecal occult blood test positive  Other results:  PIR:JJOACZYSAY reviewed. Normal sinus rhythm at 80 bpm with ventricular bigeminy, normal axis,  normal intervals, no significant Q waves, no LVH by voltage, good R wave progression, inferior T wave flattening, no comparisons immediately available.  Assessment & Plan by Problem:  Roberto Owen is an 82 year old man with a previous history of anemia diagnosed one year ago who was in his usual state of health until the evening of admission when, while sitting after eating a big meal at [facility], he felt fatigued, lightheadedness and briefly lost consciousness. When seen in the emergency department he was noted to be significantly anemic with a hemoglobin of 6.2 and a positive fecal occult blood test card. He has not  had any signs or symptoms of an acute GI bleed or blood loss elsewhere. Given the iron deficiency, very low MCV and history of anemia approximately one year ago, I suspect this is a chronic process most likely related to a gastritis from the continuous Goody powder use.  1) Symptomatic anemia likely related to aspirin use: He has been transfused and will be followed with serial hemoglobins. The target is to keep the hemoglobin above 7. He is nothing by mouth in preparation for an EGD. He has an active type and cross in the lab and has 2 wide bore IVs. We will continue with the IV PPI therapy.  2) Syncope: Likely related to symptomatic anemia rather than a cardiac or neurologic cause. Therefore, we are pursuing a GI workup at this time.  3) Disposition: I agree he will be ready for discharge home after his EGD in the morning assuming his hemoglobin remains stable. I would not be surprised if the EGD was not necessarily diagnostic and he would therefore require further evaluation which could include a capsule endoscopy if we can get the records of the colonoscopy he had done one year ago to assure it was negative as he says.

## 2017-07-24 NOTE — Progress Notes (Signed)
Internal Medicine Attending  Date: 07/24/2017  Patient name: Roberto Owen Medical record number: [id number] Date of birth: 1935-12-03 Age: 82 y.o. Gender: male  I saw and evaluated the patient. I reviewed the resident's note by Dr. Johny Chess and I agree with the resident's findings and plans as documented in his progress note.  Please see my H&P dated 07/24/2017 for the specifics of my evaluation, assessment, plan from earlier in the day.

## 2017-07-24 NOTE — Progress Notes (Signed)
Nutrition Brief Note  Patient identified on the Malnutrition Screening Tool (MST) Report. Had reported wt loss and decreased appetite.  Wt Readings from Last 15 Encounters:  07/23/17 223 lb 4.8 oz (101.3 kg)   Pt says that a couple weeks back he was not eating well, more so due to a disordered eating pattern than lack of appetite, but this last week he has been eating extremely well. He says he has lost weight, but that was intentional; he wants to fit into clothes better.   At this time, he has no complaints whatsoever and says the only reason he is here is to "see where my blood went"  At baseline he is very active and has no major medical problems.   Body mass index is 25.8 kg/m. Patient meets criteria for overweight based on current BMI.   Current diet order is NPO as he is scheduled to undergo an endoscopy.  No nutrition interventions warranted at this time. If nutrition issues arise, please consult RD.   Burtis Junes RD, LDN, CNSC Clinical Nutrition Pager: [phone] 07/24/2017 12:16 PM

## 2017-07-25 ENCOUNTER — Encounter (HOSPITAL_COMMUNITY): Payer: Self-pay | Admitting: Gastroenterology

## 2017-07-25 ENCOUNTER — Inpatient Hospital Stay (HOSPITAL_COMMUNITY): Payer: Medicare Other | Admitting: Anesthesiology

## 2017-07-25 ENCOUNTER — Encounter (HOSPITAL_COMMUNITY)
Admission: EM | Disposition: A | Discharge status: Home / Self Care | Payer: Self-pay | Source: Home / Self Care | Attending: Internal Medicine

## 2017-07-25 DIAGNOSIS — T39395S Adverse effect of other nonsteroidal anti-inflammatory drugs [NSAID], sequela: Secondary | ICD-10-CM

## 2017-07-25 DIAGNOSIS — B9681 Helicobacter pylori [H. pylori] as the cause of diseases classified elsewhere: Secondary | ICD-10-CM

## 2017-07-25 DIAGNOSIS — K259 Gastric ulcer, unspecified as acute or chronic, without hemorrhage or perforation: Secondary | ICD-10-CM

## 2017-07-25 DIAGNOSIS — K250 Acute gastric ulcer with hemorrhage: Secondary | ICD-10-CM

## 2017-07-25 DIAGNOSIS — K228 Other specified diseases of esophagus: Secondary | ICD-10-CM

## 2017-07-25 DIAGNOSIS — K2210 Ulcer of esophagus without bleeding: Secondary | ICD-10-CM

## 2017-07-25 HISTORY — PX: ESOPHAGOGASTRODUODENOSCOPY (EGD) WITH PROPOFOL: SHX5813

## 2017-07-25 LAB — BASIC METABOLIC PANEL
ANION GAP: 10 (ref 5–15)
BUN: 7 mg/dL (ref 6–20)
CO2: 23 mmol/L (ref 22–32)
Calcium: 8.3 mg/dL — ABNORMAL LOW (ref 8.9–10.3)
Chloride: 106 mmol/L (ref 101–111)
Creatinine, Ser: 0.82 mg/dL (ref 0.61–1.24)
GFR calc non Af Amer: >60 mL/min (ref >60)
GLUCOSE: 89 mg/dL (ref 65–99)
POTASSIUM: 3.4 mmol/L — AB (ref 3.5–5.1)
Sodium: 139 mmol/L (ref 135–145)

## 2017-07-25 LAB — CBC
HEMATOCRIT: 24.8 % — AB (ref 39.0–52.0)
HEMATOCRIT: 26.5 % — AB (ref 39.0–52.0)
HEMOGLOBIN: 7 g/dL — AB (ref 13.0–17.0)
HEMOGLOBIN: 7.5 g/dL — AB (ref 13.0–17.0)
MCH: 17.9 pg — AB (ref 26.0–34.0)
MCH: 18 pg — ABNORMAL LOW (ref 26.0–34.0)
MCHC: 28.2 g/dL — AB (ref 30.0–36.0)
MCHC: 28.3 g/dL — ABNORMAL LOW (ref 30.0–36.0)
MCV: 63.6 fL — ABNORMAL LOW (ref 78.0–100.0)
MCV: 63.7 fL — AB (ref 78.0–100.0)
Platelets: 269 10*3/uL (ref 150–400)
Platelets: 284 10*3/uL (ref 150–400)
RBC: 3.9 MIL/uL — ABNORMAL LOW (ref 4.22–5.81)
RBC: 4.16 MIL/uL — AB (ref 4.22–5.81)
RDW: 22.6 % — ABNORMAL HIGH (ref 11.5–15.5)
RDW: 22.5 % — ABNORMAL HIGH (ref 11.5–15.5)
WBC: 5 10*3/uL (ref 4.0–10.5)
WBC: 5.4 10*3/uL (ref 4.0–10.5)

## 2017-07-25 SURGERY — ESOPHAGOGASTRODUODENOSCOPY (EGD) WITH PROPOFOL
Anesthesia: Monitor Anesthesia Care

## 2017-07-25 MED ORDER — PROPOFOL 10 MG/ML IV BOLUS
INTRAVENOUS | Status: DC | PRN
  Administered 2017-07-25: 120 mg via INTRAVENOUS

## 2017-07-25 MED ORDER — LACTATED RINGERS IV SOLN
INTRAVENOUS | Status: DC | PRN
  Administered 2017-07-25: 11:00:00 via INTRAVENOUS

## 2017-07-25 MED ORDER — POTASSIUM CHLORIDE CRYS ER 20 MEQ PO TBCR
40.0000 meq | EXTENDED_RELEASE_TABLET | Freq: Once | ORAL | Status: AC
  Administered 2017-07-25: 40 meq via ORAL
  Filled 2017-07-25: qty 2

## 2017-07-25 MED ORDER — FERUMOXYTOL INJECTION 510 MG/17 ML
510.0000 mg | Freq: Once | INTRAVENOUS | Status: AC
  Administered 2017-07-25: 510 mg via INTRAVENOUS
  Filled 2017-07-25: qty 17

## 2017-07-25 MED ORDER — SUCRALFATE 1 GM/10ML PO SUSP
1.0000 g | Freq: Three times a day (TID) | ORAL | Status: DC
  Administered 2017-07-25 – 2017-07-26 (×4): 1 g via ORAL
  Filled 2017-07-25 (×4): qty 10

## 2017-07-25 MED ORDER — FERROUS SULFATE 325 (65 FE) MG PO TABS
325.0000 mg | ORAL_TABLET | Freq: Every day | ORAL | Status: DC
  Administered 2017-07-25 – 2017-07-26 (×2): 325 mg via ORAL
  Filled 2017-07-25 (×2): qty 1

## 2017-07-25 MED ORDER — PANTOPRAZOLE SODIUM 40 MG PO TBEC
40.0000 mg | DELAYED_RELEASE_TABLET | Freq: Two times a day (BID) | ORAL | Status: DC
  Administered 2017-07-26: 40 mg via ORAL
  Filled 2017-07-25 (×2): qty 1

## 2017-07-25 SURGICAL SUPPLY — 14 items

## 2017-07-25 NOTE — Transfer of Care (Signed)
Immediate Anesthesia Transfer of Care Note  Patient: Roberto Owen  Procedure(s) Performed: Procedure(s): ESOPHAGOGASTRODUODENOSCOPY (EGD) WITH PROPOFOL (N/A)  Patient Location: PACU  Anesthesia Type:MAC  Level of Consciousness: awake, alert , oriented and patient cooperative  Airway & Oxygen Therapy: Patient Spontanous Breathing and Patient connected to nasal cannula oxygen  Post-op Assessment: Report given to RN, Post -op Vital signs reviewed and stable and Patient moving all extremities  Post vital signs: Reviewed and stable  Last Vitals:  Vitals:   07/25/17 1107 07/25/17 1140  BP: (!) 170/77 139/74  Pulse: 86 71  Resp: 19 20  Temp: 36.5 C     Last Pain:  Vitals:   07/25/17 1107  TempSrc: Oral  PainSc:          Complications: No apparent anesthesia complications

## 2017-07-25 NOTE — H&P (View-Only) (Signed)
Attending physician's note   I have taken a history, examined the patient and reviewed the chart. I agree with the Advanced Practitioner's note, impression and recommendations.  82 year old male with history of iron deficiency anemia admitted with presyncope, worsening anemia and heme positive stool. Per patient he had EGD, colonoscopy and extensive evaluation including urologic for iron deficiency anemia. Await reports from [facility].  Patient denies any overt GI bleed. He has history of chronic NSAID use. We will plan for EGD tomorrow morning to exclude gastric/peptic ulcer disease Continue to monitor hemoglobin every 12 hours and transfuse to maintain hemoglobin greater than 7 Clear liquids and nothing by mouth after midnight Continue PPI twice daily  K Denzil Magnuson, MD [phone] Mon-Fri 8a-5p [phone] after 5p, weekends, holidays  Referring Provider: Internal Medicine Teaching Service  Primary Care Physician:  System, Pcp Not In Primary Gastroenterologist:   Unassigned  Reason for Consultation:  Anemia, heme positive stools  ASSESSMENT AND PLAN:    1. 82 yo male with symptomatic iron deficiency anemia, heme positive stools in setting of chronic BID NSAIDs. Hgb 6.2. No overt GI bleeding. No GI symptoms. Sounds like he underwent inpatient anemia workup in [place] 11 months ago. He describes negative EGD and colonoscopy. Records requested. -discussed potential side effects of BC powders with patient and daughter.  -EGD tomorrow. The risks and benefits of EGD were discussed and the patient agrees to proceed.  -Await records from [place]. If colonoscopy was complete with well prepped colon then repeat colonoscopy shouldn't be necessary. He may need small bowel evaluation at some point.  -hgb up less than a gram after 2 units of blood. He may need another unit.    HPI: Roberto Owen is a 82 y.o. male from [place] in [place] visiting daughter. who presented to ED  yesterday afternoon after ? syncopal episode. Patient had eaten a large meal, subsequently felt weak. He put his head down on the table, EMS was called. Patient says he remembers everything and didn't lose consciousness. No SOB or chest pain. In ED he was found to have a hgb of 6.2. FOBT +. Given 2 units of blood.   Patient hosopitalized a [place] ago in [place] for UTI/ hematuria. Found to be anemic at the time. Apparently had a negative colonoscopy and EGD in [place] 11 months while hospitalized with urinary sx. Apparently anemic as well given endoscopic procedures. Patient says he was treated with some anemia pills and at time of check up a few weeks later everything was fine. He has taken [facility] powders twice daily for 25 years. He has not had any overt GI bleeding. No abdominal pain. No nausea / no vomiting. No weight loss.    Past Medical History:  Diagnosis Date  . Anemia     History reviewed. No pertinent surgical history.  Prior to Admission medications   Medication Sig Start Date End Date Taking? Authorizing Provider  Aspirin-Salicylamide-Caffeine (BC HEADACHE POWDER PO) Take 1 packet by mouth 2 (two) times daily. For pain.    Yes [provider]  Multiple Vitamins-Minerals (ADULT ONE DAILY GUMMIES) CHEW Chew 2 tablets by mouth daily.   Yes [provider]    Current Facility-Administered Medications  Medication Dose Route Frequency Provider Last Rate Last Dose  . 0.9 %  sodium chloride infusion   Intravenous Continuous Jule Ser, DO      . 0.9 %  sodium chloride infusion   Intravenous Once Margarita Mail, PA-C      .  pantoprazole (PROTONIX) injection 40 mg  40 mg Intravenous Q12H Jule Ser, DO   40 mg at 07/23/17 2030  . polyvinyl alcohol (LIQUIFILM TEARS) 1.4 % ophthalmic solution 1 drop  1 drop Both Eyes PRN Oval Linsey, MD   1 drop at 07/24/17 0509  . sodium chloride flush (NS) 0.9 % injection 3 mL  3 mL Intravenous Q12H Jule Ser, DO         Allergies as of 07/23/2017  . (No Known Allergies)    Family History  Problem Relation Age of Onset  . Arthritis Mother   . Arthritis Father   . Heart disease Neg Hx   . Kidney disease Neg Hx   . Diabetes Neg Hx     Social History   Social History  . Marital status: Divorced    Spouse name: N/A  . Number of children: N/A  . Years of education: N/A   Occupational History  . Not on file.   Social History Main Topics  . Smoking status: Never Smoker  . Smokeless tobacco: Never Used  . Alcohol use No  . Drug use: No  . Sexual activity: Not on file   Other Topics Concern  . Not on file   Social History Narrative  . No narrative on file    Review of Systems: All systems reviewed and negative except where noted in HPI.  Physical Exam: Vital signs in last 24 hours: Temp:  [98 F (36.7 C)-98.6 F (37 C)] 98.6 F (37 C) (08/04 0506) Pulse Rate:  [37-102] 77 (08/04 0506) Resp:  [15-25] 20 (08/04 0506) BP: (111-150)/(48-95) 134/83 (08/04 0506) SpO2:  [97 %-100 %] 100 % (08/04 0506) Weight:  [223 lb 4.8 oz (101.3 kg)] 223 lb 4.8 oz (101.3 kg) (08/03 1847) Last BM Date: 07/23/17 General:   Alert, well-developed,  [name] in NAD Psych:  Pleasant, cooperative. Normal mood and affect. Eyes:  Pupils equal, sclera clear, no icterus.   Conjunctiva pink. Ears:  Normal auditory acuity. Nose:  No deformity, discharge,  or lesions. Neck:  Supple; no masses Lungs:  Clear throughout to auscultation.   No wheezes, crackles, or rhonchi.  Heart:  Regular rate, irreg rhythm; no murmurs Abdomen:  Soft, non-distended, nontender, BS active, no palp mass    Rectal:  Deferred  Msk:  Symmetrical without gross deformities. . Pulses:  Normal pulses noted. Neurologic:  Alert and  oriented x4;  grossly normal neurologically. Skin:  Intact without significant lesions or rashes..   Intake/Output from previous day: 08/03 0701 - 08/04 0700 In: 2981.7 [I.V.:58.3; Blood:690; IV  Piggyback:2233.3] Out: 400 [Urine:400] Intake/Output this shift: No intake/output data recorded.  Lab Results:  Recent Labs  07/23/17 1328 07/24/17 0711  WBC 7.6 5.0  HGB 6.2* 7.0*  HCT 23.1* 24.3*  PLT 343 241   BMET  Recent Labs  07/23/17 1328 07/24/17 0711  NA 140 140  K 3.3* 3.4*  CL 108 109  CO2 25 27  GLUCOSE 125* 90  BUN 7 6  CREATININE 1.12 0.84  CALCIUM 8.7* 8.0*   LFT  Recent Labs  07/23/17 1356  PROT 7.2  ALBUMIN 3.0*  AST 19  ALT 8*  ALKPHOS 64  BILITOT 0.6  BILIDIR 0.1  IBILI 0.5   PT/INR  Recent Labs  07/23/17 1328  LABPROT 14.1  INR 1.09    Tye Savoy, NP-C @  07/24/2017, 9:29 AM  Pager number [phone]

## 2017-07-25 NOTE — Progress Notes (Signed)
   Subjective:  Patient seen and examined.  No new complaints.  States he continues to feel great.  Ready for EGD today.  Objective:  Vital signs in last 24 hours: Vitals:   07/24/17 0506 07/24/17 1353 07/24/17 2112 07/25/17 0500  BP: 134/83 95/72 (!) 130/58 135/75  Pulse: 77 66 68 67  Resp: 20 14 18 18   Temp: 98.6 F (37 C) 97.9 F (36.6 C) 98 F (36.7 C) 99.2 F (37.3 C)  TempSrc: Oral Oral Oral Oral  SpO2: 100% 99% 100% 100%  Weight:    219 lb 14.4 oz (99.7 kg)  Height:       General: resting in bed, no distress HEENT: [place]/AT, EOMI, no scleral icterus Cardiac: RRR Pulm: clear to auscultation bilaterally, moving normal volumes of air Abd: soft, nontender, nondistended, BS present Neuro: alert and oriented, cranial nerves II-XII grossly intact   Assessment/Plan:  Severe Anemia with Suspected GI Bleed of Unknown Source Syncopal Event No further bleeding, serial CBCs have resulted in hemoglobin ranging from 7-8 with most recent being 7.  Patient continues to be asymptomatic.  Iron studies consistent with iron deficiency anemia.  Syncopal event felt to be related to his anemia or vagally mediated. - CBC every 12 hours with transfusion threshold of less than 7 - Continue Protonix IV 40mg  BID - IVF at 75 cc/hr - F/u EGD results, if unrevealing he may need capsule study - Awaiting records from [place] assistance with this patient. - Counseled on abstaining from [place] powders - Consider Feraheme infusion prior to DC and oral iron supplementation after  - Has been NPO since [place].  Advance diet pending GI recs from EGD  Dispo: Anticipated discharge in approximately 1-2 day(s).   Jule Ser, DO 07/25/2017, 8:49 AM Pager: [phone]

## 2017-07-25 NOTE — Anesthesia Preprocedure Evaluation (Addendum)
Anesthesia Evaluation  Patient identified by MRN, date of birth, ID band Patient awake    Reviewed: Allergy & Precautions, NPO status , Patient's Chart, lab work & pertinent test results  Airway Mallampati: II  TM Distance: >3 FB     Dental   Pulmonary neg pulmonary ROS,    breath sounds clear to auscultation       Cardiovascular negative cardio ROS   Rhythm:Regular Rate:Normal     Neuro/Psych negative neurological ROS     GI/Hepatic negative GI ROS, Neg liver ROS, PUD,   Endo/Other  negative endocrine ROS  Renal/GU negative Renal ROS     Musculoskeletal   Abdominal   Peds  Hematology  (+) anemia ,   Anesthesia Other Findings   Reproductive/Obstetrics                            Anesthesia Physical Anesthesia Plan  ASA: III  Anesthesia Plan: MAC   Post-op Pain Management:    Induction:   PONV Risk Score and Plan: 1 and Ondansetron and Dexamethasone  Airway Management Planned: Simple Face Mask  Additional Equipment:   Intra-op Plan:   Post-operative Plan:   Informed Consent: I have reviewed the patients History and Physical, chart, labs and discussed the procedure including the risks, benefits and alternatives for the proposed anesthesia with the patient or authorized representative who has indicated his/her understanding and acceptance.   Dental advisory given  Plan Discussed with: CRNA and Anesthesiologist  Anesthesia Plan Comments:         Anesthesia Quick Evaluation

## 2017-07-25 NOTE — Interval H&P Note (Signed)
History and Physical Interval Note:  07/25/2017 9:34 AM  Roberto Owen  has presented today for surgery, with the diagnosis of Anemia, heme positive stool  The various methods of treatment have been discussed with the patient and family. After consideration of risks, benefits and other options for treatment, the patient has consented to  Procedure(s): ESOPHAGOGASTRODUODENOSCOPY (EGD) WITH PROPOFOL (N/A) as a surgical intervention .  The patient's history has been reviewed, patient examined, no change in status, stable for surgery.  I have reviewed the patient's chart and labs.  Questions were answered to the patient's satisfaction.     Kavitha Nandigam

## 2017-07-25 NOTE — Anesthesia Procedure Notes (Signed)
Procedure Name: MAC Date/Time: 07/25/2017 11:25 AM Performed by: Izora Gala Pre-anesthesia Checklist: Patient identified, Emergency Drugs available, Suction available and Patient being monitored Patient Re-evaluated:Patient Re-evaluated prior to induction Oxygen Delivery Method: Nasal cannula Preoxygenation: Pre-oxygenation with 100% oxygen Induction Type: IV induction Placement Confirmation: positive ETCO2

## 2017-07-25 NOTE — Progress Notes (Signed)
Called the team taking care of Roberto Owen and told them that pt's hemoglobin dropped from 7.9 to 7.0 and I was told they will see patient later today. I mistaken ly paged [facility] and Dr Georges Mouse ordered CBC the group taking care of him said they will see him later

## 2017-07-25 NOTE — Op Note (Signed)
[facility] Patient Name: Roberto Owen Procedure Date : 07/25/2017 MRN: [id number] Attending MD: Mauri Pole , MD Date of Birth: March 27, 1935 CSN: [id number] Age: 82 Admit Type: Inpatient Procedure:                Upper GI endoscopy Indications:              Active gastrointestinal bleeding, Suspected upper                            gastrointestinal bleeding Providers:                Mauri Pole, MD, Carolynn Comment, RN,                            Corliss Parish, Technician Referring MD:              Medicines:                Monitored Anesthesia Care Complications:            No immediate complications. Estimated Blood Loss:     Estimated blood loss was minimal. Procedure:                Pre-Anesthesia Assessment:                           - Prior to the procedure, a History and Physical                            was performed, and patient medications and                            allergies were reviewed. The patient's tolerance of                            previous anesthesia was also reviewed. The risks                            and benefits of the procedure and the sedation                            options and risks were discussed with the patient.                            All questions were answered, and informed consent                            was obtained. Prior Anticoagulants: The patient                            last took previous NSAID medication 1 day prior to                            the procedure. ASA Grade Assessment: III - A  patient with severe systemic disease. After                            reviewing the risks and benefits, the patient was                            deemed in satisfactory condition to undergo the                            procedure.                           After obtaining informed consent, the endoscope was                            passed under direct vision. Throughout  the                            procedure, the patient's blood pressure, pulse, and                            oxygen saturations were monitored continuously. The                            EG-[id number] ([id number]) scope was introduced through the                            mouth, and advanced to the second part of duodenum.                            The upper GI endoscopy was accomplished without                            difficulty. The patient tolerated the procedure                            well. Scope In: Scope Out: Findings:      White nummular lesions were noted in the lower third of the esophagus.       Biopsies were taken with a cold forceps for histology.      One linear esophageal ulcer at EG junction extending into gastric cardia       with no bleeding and no stigmata of recent bleeding was found 38 to 40       cm from the incisors. The lesion was one mm by eighteen mm in largest       dimension.      Five non-obstructing non-bleeding cratered gastric ulcers with no       stigmata of bleeding were found at the incisura, in the gastric antrum,       in the prepyloric region of the stomach and at the pylorus. The largest       lesion was 7 mm in largest dimension. There is no evidence of       perforation. Biopsies were taken with a cold forceps for Helicobacter       pylori testing.      The  examined duodenum was normal. Impression:               - White nummular lesions in esophageal mucosa.                            Biopsied.                           - Non-bleeding esophageal ulcer.                           - Non-obstructing non-bleeding gastric ulcers with                            no stigmata of bleeding. NSAID induced etiology.                            There is no evidence of perforation. Biopsied.                           - Normal examined duodenum. Moderate Sedation:      N/A Recommendation:           - Patient has a contact number available for                             emergencies. The signs and symptoms of potential                            delayed complications were discussed with the                            patient. Return to normal activities tomorrow.                            Written discharge instructions were provided to the                            patient.                           - Resume previous diet.                           - Continue present medications.                           - Protonix 40mg  BID X 3 months                           - Carafate 1 gm suspension before meals and at                            bedtime X2 weeks                           - Await pathology results. If posiitve for candida  for esophageal biopsies Fluconazole 100mg  daily X 7                            days and treat H.pylori if positive                           - Avoid NSAID's                           - Monitor Hgb and transfuse as needed Procedure Code(s):        --- Professional ---                           [phone], Esophagogastroduodenoscopy, flexible,                            transoral; with biopsy, single or multiple Diagnosis Code(s):        --- Professional ---                           K22.8, Other specified diseases of esophagus                           K22.10, Ulcer of esophagus without bleeding                           T39.395S, Adverse effect of other nonsteroidal                            anti-inflammatory drugs [NSAID], sequela                           K25.9, Gastric ulcer, unspecified as acute or                            chronic, without hemorrhage or perforation                           K92.2, Gastrointestinal hemorrhage, unspecified CPT copyright 2016 American Medical Association. All rights reserved. The codes documented in this report are preliminary and upon coder review may  be revised to meet current compliance requirements. Mauri Pole, MD 07/25/2017 11:40:48 AM This  report has been signed electronically. Number of Addenda: 0

## 2017-07-26 ENCOUNTER — Telehealth: Payer: Self-pay

## 2017-07-26 ENCOUNTER — Encounter (HOSPITAL_COMMUNITY): Payer: Self-pay | Admitting: Gastroenterology

## 2017-07-26 DIAGNOSIS — R55 Syncope and collapse: Secondary | ICD-10-CM

## 2017-07-26 DIAGNOSIS — E876 Hypokalemia: Secondary | ICD-10-CM

## 2017-07-26 DIAGNOSIS — Z9889 Other specified postprocedural states: Secondary | ICD-10-CM

## 2017-07-26 DIAGNOSIS — K250 Acute gastric ulcer with hemorrhage: Principal | ICD-10-CM

## 2017-07-26 LAB — CBC
HEMATOCRIT: 25.6 % — AB (ref 39.0–52.0)
HEMOGLOBIN: 7.3 g/dL — AB (ref 13.0–17.0)
MCH: 18.3 pg — AB (ref 26.0–34.0)
MCHC: 28.5 g/dL — AB (ref 30.0–36.0)
MCV: 64.3 fL — AB (ref 78.0–100.0)
Platelets: 246 10*3/uL (ref 150–400)
RBC: 3.98 MIL/uL — ABNORMAL LOW (ref 4.22–5.81)
RDW: 23.5 % — AB (ref 11.5–15.5)
WBC: 5.1 10*3/uL (ref 4.0–10.5)

## 2017-07-26 LAB — BASIC METABOLIC PANEL
Anion gap: 7 (ref 5–15)
BUN: 5 mg/dL — ABNORMAL LOW (ref 6–20)
CALCIUM: 8.1 mg/dL — AB (ref 8.9–10.3)
CHLORIDE: 108 mmol/L (ref 101–111)
CO2: 27 mmol/L (ref 22–32)
CREATININE: 0.92 mg/dL (ref 0.61–1.24)
GFR calc non Af Amer: >60 mL/min (ref >60)
GLUCOSE: 95 mg/dL (ref 65–99)
Potassium: 3.9 mmol/L (ref 3.5–5.1)
Sodium: 142 mmol/L (ref 135–145)

## 2017-07-26 MED ORDER — FENTANYL CITRATE (PF) 100 MCG/2ML IJ SOLN: 25.0000 ug | INTRAMUSCULAR | Status: DC | PRN

## 2017-07-26 MED ORDER — FERROUS SULFATE 325 (65 FE) MG PO TABS: 325.0000 mg | ORAL_TABLET | Freq: Every day | ORAL | 5 refills | Status: DC

## 2017-07-26 MED ORDER — PANTOPRAZOLE SODIUM 40 MG PO TBEC: 40.0000 mg | DELAYED_RELEASE_TABLET | Freq: Two times a day (BID) | ORAL | 2 refills | Status: DC

## 2017-07-26 MED ORDER — SUCRALFATE 1 GM/10ML PO SUSP: 1.0000 g | Freq: Three times a day (TID) | ORAL | 0 refills | Status: DC

## 2017-07-26 NOTE — Discharge Summary (Signed)
Name: Roberto Owen MRN: [id number] DOB: [birth year] 82 y.o. PCP: System, Pcp Not In  Date of Admission: 07/23/2017  1:17 PM Date of Discharge: 07/26/2017 Attending Physician: Annia Belt, MD  Discharge Diagnosis: Principal Problem:   GI bleed Active Problems:   Hypokalemia   Syncope   Microcytic anemia   Anemia due to chronic blood loss   Acute gastric ulcer with hemorrhage   Multiple gastric ulcers   Discharge Medications: Allergies as of 07/26/2017   No Known Allergies     Medication List    STOP taking these medications   BC HEADACHE POWDER PO     TAKE these medications   ADULT ONE DAILY GUMMIES Chew Chew 2 tablets by mouth daily.   ferrous sulfate 325 (65 FE) MG tablet Take 1 tablet (325 mg total) by mouth daily with breakfast.   pantoprazole 40 MG tablet Commonly known as:  PROTONIX Take 1 tablet (40 mg total) by mouth 2 (two) times daily.   sucralfate 1 GM/10ML suspension Commonly known as:  CARAFATE Take 10 mLs (1 g total) by mouth 4 (four) times daily -  with meals and at bedtime.       Disposition and follow-up:   [name] was discharged from [facility] in Good condition.  At the hospital follow up visit please address:  1.  --Ensure he is taking Pantoprazole and Carafate and has discontinued using Goody powder  --Re-check CBC  --Has continued to take oral iron   2.  Labs / imaging needed at time of follow-up: CBC  3.  Pending labs/ test needing follow-up: Ulcer biopsy results  Follow-up Appointments: Follow-up Information    [facility] Follow up.   Why:  Appointment made for 08/09/2017 at 10:15 am. It's here in the hospital on the ground floor  Contact information: [address] [place] [facility] Course by problem list:  GI bleed with severe anemia Syncopal episode  Pt presented s/p syncopal episode and found to have severe  anemia with Hgb 6.2, and positive occult blood test in the setting of significant Goody powder use. He was hemodynamically stable and had no complaints during his admission. He has had a workup for anemia in the past including colonoscopy, however we were unable to obtain medical records after contacting the reported outside hospital. His syncopal episode may have been vasovagal in nature with reported prodrome, following large meal with vomiting afterward. He received 2 U pRBCs with increase in Hgb which stayed stable above 7, IV PPI. EGD revealed multiple non-bleeding ulcers, biopsies taken-these may be a source of chronic bleeding. He was discharged on oral PPI BID x 3 months and Carafate with meals and at bedtime x 2 weeks. He will follow up with [facility].     Chronic Iron Deficiency Anemia secondary to blood loss Pt has likely chronically lost blood via GI source as above. His Hgb improved post-transfusion and remained stable. Iron studies revealed Fe 10, Ferritin 4. He received IV Iron and was discharged on Iron sulfate 325 mg daily.    Discharge Vitals:   BP (!) 133/55 (BP Location: Left Arm)   Pulse 73   Temp 98.4 F (36.9 C) (Oral)   Resp 17   Ht 6\' 6"  (1.981 m)   Wt 223 lb 3.2 oz (101.2 kg)   SpO2 99%   BMI 25.79 kg/m   Pertinent Labs,  Studies, and Procedures:  CBC    Component Value Date/Time   WBC 5.1 07/26/2017 0631   RBC 3.98 (L) 07/26/2017 0631   HGB 7.3 (L) 07/26/2017 0631   HCT 25.6 (L) 07/26/2017 0631   PLT 246 07/26/2017 0631   MCV 64.3 (L) 07/26/2017 0631   MCH 18.3 (L) 07/26/2017 0631   MCHC 28.5 (L) 07/26/2017 0631   RDW 23.5 (H) 07/26/2017 0631     Discharge Instructions: Discharge Instructions    Diet - low sodium heart healthy    Complete by:  As directed    Discharge instructions    Complete by:  As directed    --Take Pantoprazole/Protonix twice a day for 3 months  --Take the Sucralfate/Carafate before meals and before bed for 2 weeks --Follow up  with the GI doctors who saw you in the hospital  --Take Iron Pills once a day with breakfast to help build your blood counts back up  --Follow up in the [facility] Center on 08/09/2017 at 10: 15 am to check on your blood count and make sure you're still doing well. They should contact you to make an appointment time. You can also establish care there with a PCP if you'd like to.   --DON'T take any more BC Goody Powders!!      Signed: Tawny Asal, MD 07/26/2017, 12:58 PM   Pager: [phone]

## 2017-07-26 NOTE — Telephone Encounter (Signed)
Hospital TOC per Dr Johny Chess, discharge 07/26/2017, appt 08/09/2017 @ 10:15.

## 2017-07-26 NOTE — Plan of Care (Signed)
Problem: Bowel/Gastric: Goal: Will show no signs and symptoms of gastrointestinal bleeding Outcome: Progressing Pt's signs and symptoms will began to resolve prior to discharge.  Problem: Safety: Goal: Ability to remain free from injury will improve Outcome: Progressing Pt will be free from falls and injuries during this hospitalization.

## 2017-07-26 NOTE — Progress Notes (Signed)
   Subjective: Pt continues to have no complaints and reports he is feeling great. He was counseled on the need to discontinue BC Goody powders and his treatment plan based on EGD results.   Objective:  Vital signs in last 24 hours: Vitals:   07/25/17 1800 07/25/17 2202 07/26/17 0517 07/26/17 0519  BP: (!) 142/67 129/60  (!) 133/55  Pulse: 72 (!) 52  73  Resp: 17 17  17   Temp: 98.1 F (36.7 C) 98.2 F (36.8 C)  98.4 F (36.9 C)  TempSrc: Oral Oral  Oral  SpO2: 100% 95%  99%  Weight:   223 lb 3.2 oz (101.2 kg)   Height:       Physical Exam  Constitutional: He is oriented to person, place, and time. He appears well-developed and well-nourished.  Elderly gentleman comfortably resting in bed in no acute distress   HENT:  Head: Normocephalic and atraumatic.  Cardiovascular: Normal rate and regular rhythm.   Pulmonary/Chest: Effort normal. No respiratory distress.  Abdominal: Soft. He exhibits no distension. There is no tenderness. There is no guarding.  Musculoskeletal: Normal range of motion.  Neurological: He is alert and oriented to person, place, and time.  Skin: Skin is warm and dry.     Assessment/Plan:  1.Suspected GI bleed of unknown source with severe anemia Syncopal episode   Pt presenting s/p syncopal episode and found to have severe anemia, Hgb 6.2, and positive occult blood test with significant Goody powder use. He is hemodynamically stable with no specific complaints. He has had a workup for anemia in the past, though details unavailable pt reports normal colonoscopy. Syncopal episode may have been vasovagal in nature with reported prodrome, following large meal. GI consulted, appreciate their recommendations. Received 2 U pRBCs with increase in Hgb to 7.0. Iron studies consistent with IDA. This is likely a chronic process rather than acute. EGD showed multiple non-bleeding ulcers. Received IV Fe. Hgb currently 7.3  --CBC q12 hrs, transfuse <7 --PO Pantoprazole 40 mg  BID --Sucralfate 1 g TID with meals and bedtime --Fe Sulfate 325 mg daily  --F/u further GI recommendations --Full diet      Dispo: Anticipated discharge today.   Tawny Asal, MD 07/26/2017, 8:23 AM Pager: [phone]

## 2017-07-27 LAB — TYPE AND SCREEN
ABO/RH(D): O POS
Antibody Screen: NEGATIVE
UNIT DIVISION: 0
Unit division: 0
Unit division: 0

## 2017-07-27 LAB — BPAM RBC
BLOOD PRODUCT EXPIRATION DATE: 201809042359
BLOOD PRODUCT EXPIRATION DATE: 201809052359
Blood Product Expiration Date: 201809042359
ISSUE DATE / TIME: 201808032114
ISSUE DATE / TIME: 201808040117
UNIT TYPE AND RH: 5100
Unit Type and Rh: 5100
Unit Type and Rh: 5100

## 2017-07-27 NOTE — Anesthesia Postprocedure Evaluation (Signed)
Anesthesia Post Note  Patient: Roberto Owen  Procedure(s) Performed: Procedure(s) (LRB): ESOPHAGOGASTRODUODENOSCOPY (EGD) WITH PROPOFOL (N/A)     Patient location during evaluation: PACU Anesthesia Type: MAC Level of consciousness: awake Pain management: pain level controlled Vital Signs Assessment: post-procedure vital signs reviewed and stable Respiratory status: spontaneous breathing Cardiovascular status: stable Postop Assessment: no signs of nausea or vomiting Anesthetic complications: no    Last Vitals:  Vitals:   07/25/17 2202 07/26/17 0519  BP: 129/60 (!) 133/55  Pulse: (!) 52 73  Resp: 17 17  Temp: 36.8 C 36.9 C    Last Pain:  Vitals:   07/26/17 0936  TempSrc:   PainSc: 0-No pain                 Shernita Rabinovich

## 2017-07-28 ENCOUNTER — Other Ambulatory Visit: Payer: Self-pay

## 2017-07-28 DIAGNOSIS — K922 Gastrointestinal hemorrhage, unspecified: Secondary | ICD-10-CM

## 2017-07-28 MED ORDER — BIS SUBCIT-METRONID-TETRACYC 140-125-125 MG PO CAPS: 3.0000 | ORAL_CAPSULE | Freq: Three times a day (TID) | ORAL | 0 refills | Status: DC

## 2017-07-29 ENCOUNTER — Telehealth: Payer: Self-pay

## 2017-07-29 ENCOUNTER — Other Ambulatory Visit: Payer: Self-pay

## 2017-07-29 DIAGNOSIS — D62 Acute posthemorrhagic anemia: Secondary | ICD-10-CM

## 2017-07-29 NOTE — Telephone Encounter (Signed)
Pt scheduled to see Tye Savoy NP 08/12/17@1 :30pm. Order in for CBC. Unable to reach pt regarding appt. Spoke with Internal med clinic and they will notify pt of appt when he is seen on 08/09/17.

## 2017-07-29 NOTE — Telephone Encounter (Signed)
Pt needs to start on pylera and we have been unable to get in touch with the patient. Please notify him of his OV with our office when he is seen in the clinic on 08/09/17. He also needs to pick up the script for pylera from our office.

## 2017-08-09 ENCOUNTER — Ambulatory Visit (INDEPENDENT_AMBULATORY_CARE_PROVIDER_SITE_OTHER): Payer: Medicare Other | Admitting: Internal Medicine

## 2017-08-09 VITALS — BP 147/79 | HR 79 | Temp 97.5°F | Ht 78.0 in | Wt 209.7 lb

## 2017-08-09 DIAGNOSIS — K254 Chronic or unspecified gastric ulcer with hemorrhage: Secondary | ICD-10-CM

## 2017-08-09 DIAGNOSIS — Z8719 Personal history of other diseases of the digestive system: Secondary | ICD-10-CM | POA: Diagnosis not present

## 2017-08-09 DIAGNOSIS — Z9889 Other specified postprocedural states: Secondary | ICD-10-CM

## 2017-08-09 DIAGNOSIS — K259 Gastric ulcer, unspecified as acute or chronic, without hemorrhage or perforation: Secondary | ICD-10-CM

## 2017-08-09 DIAGNOSIS — D500 Iron deficiency anemia secondary to blood loss (chronic): Secondary | ICD-10-CM | POA: Diagnosis not present

## 2017-08-09 DIAGNOSIS — K2210 Ulcer of esophagus without bleeding: Secondary | ICD-10-CM | POA: Diagnosis not present

## 2017-08-09 DIAGNOSIS — B9681 Helicobacter pylori [H. pylori] as the cause of diseases classified elsewhere: Secondary | ICD-10-CM

## 2017-08-09 DIAGNOSIS — Z5189 Encounter for other specified aftercare: Secondary | ICD-10-CM | POA: Diagnosis present

## 2017-08-09 NOTE — Progress Notes (Signed)
   CC: For hospital follow-up-recent admission due to upper GI bleed.  HPI:  Roberto Owen is a 82 y.o.gentleman with no significant past medical history came to the clinic for his hospital follow-up.  He was recently discharged from [facility] on 07/26/2017 where he was admitted because of symptomatic anemia due to upper GI bleed secondary to excessive NSAID use. He was found to have multiple non bleeding linear gastric and esophageal ulcer.his biopsy was positive for H. Pylori, he was given a prescription of Pylera, according to patient he was compliant and only a few pills left to complete his 10 day course. He also has a follow-up appointment with [facility] gastroenterology on 08/12/2017. Since discharge patient was feeling better, stating that he is now able to walk long distance without any dyspnea. He stopped taking Goody powder. He just took 1 naproxen for his back pain since his discharge. He do have dark colored stools but he is also on iron supplement.He denies any blood in his stool. His appetite is normal. He has no other complaints.  Past Medical History:  Diagnosis Date  . Anemia    Review of Systems:  As per HPI.  Physical Exam:  Vitals:   08/09/17 1041  BP: (!) 147/79  Pulse: 79  Temp: (!) 97.5 F (36.4 C)  TempSrc: Oral  SpO2: 100%  Weight: 209 lb 11.2 oz (95.1 kg)  Height: 6\' 6"  (1.981 m)    General: Vital signs reviewed.  Patient is well-developed and well-nourished, in no acute distress and cooperative with exam.  Cardiovascular: RRR, S1 normal, S2 normal, no murmurs, gallops, or rubs. Pulmonary/Chest: Clear to auscultation bilaterally, no wheezes, rales, or rhonchi. Abdominal: Soft, non-tender, non-distended, BS +, no masses, organomegaly, or guarding present.  Extremities: No lower extremity edema bilaterally,  pulses symmetric and intact bilaterally. No cyanosis or clubbing. Neurological: A&O x3, Strength is normal and symmetric bilaterally,  cranial nerve II-XII are grossly intact, no focal motor deficit, sensory intact to light touch bilaterally.  Skin: Warm, dry and intact. No rashes or erythema. Psychiatric: Normal mood and affect. speech and behavior is normal. Cognition and memory are normal.  Assessment & Plan:   See Encounters Tab for problem based charting.  Patient discussed with Dr. Angelia Mould.

## 2017-08-09 NOTE — Assessment & Plan Note (Signed)
He came with dizziness due to hemoglobin of 6.2 secondary to upper GI blood loss. He was transfused with 2 units of RBCs in the hospital.  His symptoms of exertional dyspnea and dizziness has been improved since his discharge. According to patient he is able to resume his walk without any difficulty. He was instructed not to take NSAIDs anymore and patient is compliant with that.  -Continue iron supplement. -Follow-up CBC-to be done by gastroenterology on 08/12/2017.

## 2017-08-09 NOTE — Patient Instructions (Signed)
Thank you for visiting clinic today. I'm glad that you are doing very well and taking care of yourself. Please keep up the good work. Please finish all of your medicine called Pylera. You have an appointment with your stomach Dr. On Thursday at 1:30 PM, they will check your blood count in their office. Please follow-up with them as directed.

## 2017-08-09 NOTE — Assessment & Plan Note (Signed)
He was found to have multiple gastric ulcers on endoscopy done because of symptomatic anemia and positive FOBT. Most likely secondary to his excessive NSAID use which include Goody powder with high content of aspirin.  Patient was also found to have positive for H. Pylori. Currently completing his 10 day course of Pylera. He will follow-up with gastroenterology. Repeat CBC was not done today as it was ordered by his gastroenterologist for 08/12/2017. There is a plan to re-check his stool for H. Pylori after 6 weeks.  -Continue taking iron supplement. -Follow up with gastroenterology as scheduled.

## 2017-08-12 ENCOUNTER — Encounter: Payer: Self-pay | Admitting: Nurse Practitioner

## 2017-08-12 ENCOUNTER — Other Ambulatory Visit (INDEPENDENT_AMBULATORY_CARE_PROVIDER_SITE_OTHER): Payer: Medicare Other

## 2017-08-12 ENCOUNTER — Ambulatory Visit (INDEPENDENT_AMBULATORY_CARE_PROVIDER_SITE_OTHER): Payer: Medicare Other | Admitting: Nurse Practitioner

## 2017-08-12 VITALS — BP 120/80 | HR 49 | Ht 78.0 in | Wt 208.0 lb

## 2017-08-12 DIAGNOSIS — K279 Peptic ulcer, site unspecified, unspecified as acute or chronic, without hemorrhage or perforation: Secondary | ICD-10-CM | POA: Diagnosis not present

## 2017-08-12 DIAGNOSIS — B9681 Helicobacter pylori [H. pylori] as the cause of diseases classified elsewhere: Secondary | ICD-10-CM

## 2017-08-12 DIAGNOSIS — D509 Iron deficiency anemia, unspecified: Secondary | ICD-10-CM

## 2017-08-12 DIAGNOSIS — D62 Acute posthemorrhagic anemia: Secondary | ICD-10-CM

## 2017-08-12 LAB — CBC WITH DIFFERENTIAL/PLATELET
BASOS ABS: 0.1 10*3/uL (ref 0.0–0.1)
Basophils Relative: 2.5 % (ref 0.0–3.0)
EOS PCT: 2 % (ref 0.0–5.0)
Eosinophils Absolute: 0.1 10*3/uL (ref 0.0–0.7)
HCT: 30.6 % — ABNORMAL LOW (ref 39.0–52.0)
HEMOGLOBIN: 9 g/dL — AB (ref 13.0–17.0)
LYMPHS ABS: 1.6 10*3/uL (ref 0.7–4.0)
Lymphocytes Relative: 28.2 % (ref 12.0–46.0)
MCV: 70.5 fl — AB (ref 78.0–100.0)
MONO ABS: 0.4 10*3/uL (ref 0.1–1.0)
MONOS PCT: 6.9 % (ref 3.0–12.0)
NEUTROS PCT: 60.4 % (ref 43.0–77.0)
Neutro Abs: 3.4 10*3/uL (ref 1.4–7.7)
Platelets: 573 10*3/uL — ABNORMAL HIGH (ref 150.0–400.0)
RBC: 4.35 Mil/uL (ref 4.22–5.81)
RDW: 32.1 % — ABNORMAL HIGH (ref 11.5–15.5)
WBC: 5.6 10*3/uL (ref 4.0–10.5)

## 2017-08-12 NOTE — Patient Instructions (Signed)
If you are age 80 or older, your body mass index should be between 23-30. Your Body mass index is 24.04 kg/m. If this is out of the aforementioned range listed, please consider follow up with your Primary Care Provider.  If you are age 60 or younger, your body mass index should be between 19-25. Your Body mass index is 24.04 kg/m. If this is out of the aformentioned range listed, please consider follow up with your Primary Care Provider.   Your physician has requested that you go to the basement for the following lab work before leaving today: CBC  H Pylori Special Antigen on 09/09/17  - STOP Pantoprazole 14 days prior to lab (08/25/17)  Continue Pantoprazole twice daily until 08/25/17.  Then decrease to daily for 30 days then STOP.  I will call you with lab results.  Thank you for choosing me and [facility].   Tye Savoy, NP

## 2017-08-12 NOTE — Progress Notes (Signed)
     HPI: Patient is an 82 year old male who we recently saw in the hospital for iron deficiency anemia and Hemoccult-positive stools in the setting of NSAIDs. Patient is visiting from [place] so no baseline labs were available. He presented with a hemoglobin of 6.2. He was given 1 unit of blood with a rise in hemoglobin to 7.3. Discharged home on oral iron. Inpatient EGD revealed a nonbleeding esophageal ulcer and nonbleeding gastric ulcers. Patient had been taking BC powders but additionally, gastric biopsies revealed chronic active gastritis with H. Pylori. He completed pylera 2 days ago. Stools are dark on iron. Has not had BC powders since prior to recent admission. His energy level is great. Patient remains very active. He lives in [place] but will be staying here  through football season to see his grandchildren play high school football. No weakness, shortness of breath, chest pain.  Of note, patient apparently had a colonoscopy in [place] one year ago(possibly for anemia workup). Per patient exam was unremarkable.   Past Medical History:  Diagnosis Date  . Anemia     Patient's surgical history, family medical history, social history, medications and allergies were all reviewed in Epic    Physical Exam: BP 120/80 (BP Location: Left Arm, Patient Position: Sitting)   Pulse (!) 49   Ht 6\' 6"  (1.981 m)   Wt 208 lb (94.3 kg)   SpO2 96%   BMI 24.04 kg/m   GENERAL: tall thin black male in NAD PSYCH: :Pleasant, cooperative, normal affect EENT:  conjunctiva pink, mucous membranes moist, neck supple without masses CARDIAC:  RRR, no peripheral edema PULM: Normal respiratory effort ABDOMEN:  soft, nontender, nondistended, no obvious masses, no hepatomegaly,  normal bowel sounds SKIN:  turgor, no lesions seen Musculoskeletal:  Normal muscle tone, normal strength NEURO: Alert and oriented x 3, no focal neurologic deficits  ASSESSMENT and PLAN:  1. Pleasant 82 year old male with recent  admission for iron deficiency anemia and heme positive stools. Esophageal and gastric ulcer on EGD. Bx + for H.pylori. -he completed Pylera around 8/20. No longer taking BC powders -will need H.pylori stool antigen to check for eradication. This should be done early October (after holding PPI for 14 days) -continue bid ppi until end of month then decrease to daily dosing for another month.  -cbc today. Will call with results and further recommendations regarding oral iron  2. Colon cancer screening.  -Will obtain colonoscopy done a year ago in [place].   Tye Savoy , NP 08/12/2017, 1:40 PM

## 2017-08-15 NOTE — Progress Notes (Signed)
Internal Medicine Clinic Attending  Case discussed with Dr. Amin at the time of the visit.  We reviewed the resident's history and exam and pertinent patient test results.  I agree with the assessment, diagnosis, and plan of care documented in the resident's note.    

## 2017-08-16 NOTE — Progress Notes (Signed)
Reviewed and agree with documentation and assessment and plan. K. Veena Labrian Torregrossa , MD   

## 2017-08-17 LAB — DIFFERENTIAL
BASOS PCT: 1 %
Basophils Absolute: 55 cells/uL (ref 0–200)
EOS PCT: 3 %
Eosinophils Absolute: 165 cells/uL (ref 15–500)
LYMPHS PCT: 26 %
Lymphs Abs: 1430 cells/uL (ref 850–3900)
MONO ABS: 330 {cells}/uL (ref 200–950)
MONOS PCT: 6 %
NEUTROS ABS: 3520 {cells}/uL (ref 1500–7800)
NEUTROS PCT: 64 %

## 2017-09-10 ENCOUNTER — Other Ambulatory Visit: Payer: Self-pay | Admitting: Internal Medicine

## 2017-09-15 ENCOUNTER — Telehealth: Payer: Self-pay

## 2017-09-15 ENCOUNTER — Encounter (HOSPITAL_COMMUNITY): Payer: Self-pay | Admitting: *Deleted

## 2017-09-15 ENCOUNTER — Inpatient Hospital Stay (HOSPITAL_COMMUNITY)
Admission: EM | Admit: 2017-09-15 | Discharge: 2017-09-21 | DRG: 392 | Disposition: A | Discharge status: Home / Self Care | Payer: Medicare Other | Attending: Internal Medicine | Admitting: Internal Medicine

## 2017-09-15 ENCOUNTER — Ambulatory Visit (HOSPITAL_COMMUNITY)
Admission: EM | Admit: 2017-09-15 | Discharge: 2017-09-15 | Disposition: A | Discharge status: Home / Self Care | Payer: Medicare Other | Attending: Family Medicine | Admitting: Family Medicine

## 2017-09-15 ENCOUNTER — Encounter (HOSPITAL_COMMUNITY): Payer: Self-pay | Admitting: Emergency Medicine

## 2017-09-15 ENCOUNTER — Emergency Department (HOSPITAL_COMMUNITY): Payer: Medicare Other

## 2017-09-15 ENCOUNTER — Other Ambulatory Visit: Payer: Self-pay

## 2017-09-15 ENCOUNTER — Telehealth: Payer: Self-pay | Admitting: Nurse Practitioner

## 2017-09-15 DIAGNOSIS — E861 Hypovolemia: Secondary | ICD-10-CM | POA: Diagnosis present

## 2017-09-15 DIAGNOSIS — E876 Hypokalemia: Secondary | ICD-10-CM | POA: Diagnosis not present

## 2017-09-15 DIAGNOSIS — Z79899 Other long term (current) drug therapy: Secondary | ICD-10-CM

## 2017-09-15 DIAGNOSIS — K5720 Diverticulitis of large intestine with perforation and abscess without bleeding: Secondary | ICD-10-CM | POA: Diagnosis not present

## 2017-09-15 DIAGNOSIS — I951 Orthostatic hypotension: Secondary | ICD-10-CM | POA: Diagnosis present

## 2017-09-15 DIAGNOSIS — R1032 Left lower quadrant pain: Secondary | ICD-10-CM

## 2017-09-15 DIAGNOSIS — R10814 Left lower quadrant abdominal tenderness: Secondary | ICD-10-CM | POA: Diagnosis not present

## 2017-09-15 DIAGNOSIS — K5780 Diverticulitis of intestine, part unspecified, with perforation and abscess without bleeding: Secondary | ICD-10-CM | POA: Diagnosis not present

## 2017-09-15 DIAGNOSIS — E871 Hypo-osmolality and hyponatremia: Secondary | ICD-10-CM | POA: Diagnosis not present

## 2017-09-15 DIAGNOSIS — R109 Unspecified abdominal pain: Secondary | ICD-10-CM | POA: Diagnosis not present

## 2017-09-15 DIAGNOSIS — D500 Iron deficiency anemia secondary to blood loss (chronic): Secondary | ICD-10-CM | POA: Diagnosis not present

## 2017-09-15 DIAGNOSIS — N179 Acute kidney failure, unspecified: Secondary | ICD-10-CM | POA: Diagnosis present

## 2017-09-15 DIAGNOSIS — I493 Ventricular premature depolarization: Secondary | ICD-10-CM | POA: Diagnosis not present

## 2017-09-15 DIAGNOSIS — R7989 Other specified abnormal findings of blood chemistry: Secondary | ICD-10-CM | POA: Diagnosis present

## 2017-09-15 DIAGNOSIS — K279 Peptic ulcer, site unspecified, unspecified as acute or chronic, without hemorrhage or perforation: Secondary | ICD-10-CM | POA: Diagnosis present

## 2017-09-15 DIAGNOSIS — D509 Iron deficiency anemia, unspecified: Secondary | ICD-10-CM | POA: Diagnosis present

## 2017-09-15 DIAGNOSIS — Z8711 Personal history of peptic ulcer disease: Secondary | ICD-10-CM | POA: Diagnosis not present

## 2017-09-15 DIAGNOSIS — Z23 Encounter for immunization: Secondary | ICD-10-CM | POA: Diagnosis not present

## 2017-09-15 DIAGNOSIS — R195 Other fecal abnormalities: Secondary | ICD-10-CM

## 2017-09-15 DIAGNOSIS — R630 Anorexia: Secondary | ICD-10-CM

## 2017-09-15 DIAGNOSIS — R9431 Abnormal electrocardiogram [ECG] [EKG]: Secondary | ICD-10-CM | POA: Diagnosis not present

## 2017-09-15 HISTORY — DX: Adverse effect of other nonsteroidal anti-inflammatory drugs (NSAID), initial encounter: T39.395A

## 2017-09-15 HISTORY — DX: Gastrointestinal hemorrhage, unspecified: K92.2

## 2017-09-15 HISTORY — DX: Ulcer of esophagus without bleeding: K22.10

## 2017-09-15 HISTORY — DX: Personal history of other medical treatment: Z92.89

## 2017-09-15 HISTORY — DX: Other gastritis without bleeding: K29.60

## 2017-09-15 HISTORY — DX: Gastric ulcer, unspecified as acute or chronic, without hemorrhage or perforation: K25.9

## 2017-09-15 LAB — CBC
HEMATOCRIT: 29.3 % — AB (ref 39.0–52.0)
Hemoglobin: 9.3 g/dL — ABNORMAL LOW (ref 13.0–17.0)
MCH: 23.4 pg — ABNORMAL LOW (ref 26.0–34.0)
MCHC: 31.7 g/dL (ref 30.0–36.0)
MCV: 73.6 fL — ABNORMAL LOW (ref 78.0–100.0)
Platelets: 602 10*3/uL — ABNORMAL HIGH (ref 150–400)
RBC: 3.98 MIL/uL — ABNORMAL LOW (ref 4.22–5.81)
RDW: 20 % — AB (ref 11.5–15.5)
WBC: 21.3 10*3/uL — AB (ref 4.0–10.5)

## 2017-09-15 LAB — COMPREHENSIVE METABOLIC PANEL
ALK PHOS: 86 U/L (ref 38–126)
ALT: 13 U/L — ABNORMAL LOW (ref 17–63)
ANION GAP: 12 (ref 5–15)
AST: 18 U/L (ref 15–41)
Albumin: 2.5 g/dL — ABNORMAL LOW (ref 3.5–5.0)
BILIRUBIN TOTAL: 0.3 mg/dL (ref 0.3–1.2)
BUN: 11 mg/dL (ref 6–20)
CALCIUM: 8.5 mg/dL — AB (ref 8.9–10.3)
CO2: 23 mmol/L (ref 22–32)
Chloride: 96 mmol/L — ABNORMAL LOW (ref 101–111)
Creatinine, Ser: 1.3 mg/dL — ABNORMAL HIGH (ref 0.61–1.24)
GFR calc Af Amer: 58 mL/min — ABNORMAL LOW (ref >60)
GFR, EST NON AFRICAN AMERICAN: 50 mL/min — AB (ref >60)
Glucose, Bld: 115 mg/dL — ABNORMAL HIGH (ref 65–99)
POTASSIUM: 3.8 mmol/L (ref 3.5–5.1)
Sodium: 131 mmol/L — ABNORMAL LOW (ref 135–145)
TOTAL PROTEIN: 7.3 g/dL (ref 6.5–8.1)

## 2017-09-15 LAB — OCCULT BLOOD, POC DEVICE: Fecal Occult Bld: POSITIVE — AB

## 2017-09-15 LAB — I-STAT CG4 LACTIC ACID, ED: Lactic Acid, Venous: 1.05 mmol/L (ref 0.5–1.9)

## 2017-09-15 LAB — TYPE AND SCREEN
ABO/RH(D): O POS
ANTIBODY SCREEN: NEGATIVE

## 2017-09-15 MED ORDER — SODIUM CHLORIDE 0.9 % IV BOLUS (SEPSIS)
500.0000 mL | Freq: Once | INTRAVENOUS | Status: AC
  Administered 2017-09-15: 500 mL via INTRAVENOUS

## 2017-09-15 MED ORDER — METRONIDAZOLE IN NACL 5-0.79 MG/ML-% IV SOLN
500.0000 mg | Freq: Once | INTRAVENOUS | Status: AC
  Administered 2017-09-16: 500 mg via INTRAVENOUS
  Filled 2017-09-15: qty 100

## 2017-09-15 MED ORDER — IOPAMIDOL (ISOVUE-300) INJECTION 61%
INTRAVENOUS | Status: AC
  Administered 2017-09-15: 100 mL
  Filled 2017-09-15: qty 100

## 2017-09-15 MED ORDER — SODIUM CHLORIDE 0.9 % IV SOLN
Freq: Once | INTRAVENOUS | Status: AC
  Administered 2017-09-15: 22:00:00 via INTRAVENOUS

## 2017-09-15 MED ORDER — CIPROFLOXACIN IN D5W 400 MG/200ML IV SOLN
400.0000 mg | Freq: Once | INTRAVENOUS | Status: AC
  Administered 2017-09-16: 400 mg via INTRAVENOUS
  Filled 2017-09-15: qty 200

## 2017-09-15 NOTE — ED Provider Notes (Signed)
[facility] DEPT Provider Note   CSN: [id number] Arrival date & time: 09/15/17  1616     History   Chief Complaint Chief Complaint  Patient presents with  . Abdominal Pain    HPI Roberto Owen is a 82 y.o. male.  82 year old male history of gastric ulcers and GI bleed who presents with 1 week of worsening LLQ ab pain, decreased appetite, and hiccups. States declining appetite for unknown reasons. Endorses regular daily bowel movements. Denie hematemesis or hematochezia. Notes dark stools after started on iron. Admitted last month for syncope and found to have GI bleeding on EGD from gastric ulcers. Reportedly had colonoscopy in [place] over past year that was normal. Pt states he had improvement after recent discharge while on PPI and carafate before declining this week. Denies fevers, N/V/D.   The history is provided by the patient, medical records and a friend. No language interpreter was used.    Past Medical History:  Diagnosis Date  . Anemia   . GI bleed     Patient Active Problem List   Diagnosis Date Noted  . Acute gastric ulcer with hemorrhage   . Multiple gastric ulcers   . Anemia due to chronic blood loss   . GI bleed 07/23/2017  . Microcytic anemia 07/23/2017    Past Surgical History:  Procedure Laterality Date  . ESOPHAGOGASTRODUODENOSCOPY (EGD) WITH PROPOFOL N/A 07/25/2017   Procedure: ESOPHAGOGASTRODUODENOSCOPY (EGD) WITH PROPOFOL;  Surgeon: Mauri Pole, MD;  Location: [facility];  Service: Endoscopy;  Laterality: N/A;       Home Medications    Prior to Admission medications   Medication Sig Start Date End Date Taking? Authorizing Provider  ferrous sulfate 325 (65 FE) MG tablet Take 1 tablet (325 mg total) by mouth daily with breakfast. 07/27/17  Yes Tawny Asal, MD  Multiple Vitamins-Minerals (ADULT ONE DAILY GUMMIES) CHEW Chew 1 tablet by mouth daily.    Yes [provider]  pantoprazole (PROTONIX) 40 MG tablet TAKE 1 TABLET  BY MOUTH TWICE DAILY 09/10/17  Yes Lorella Nimrod, MD    Family History Family History  Problem Relation Age of Onset  . Arthritis Mother   . Arthritis Father   . Heart disease Neg Hx   . Kidney disease Neg Hx   . Diabetes Neg Hx     Social History Social History  Substance Use Topics  . Smoking status: Never Smoker  . Smokeless tobacco: Never Used  . Alcohol use No     Allergies   Patient has no known allergies.   Review of Systems Review of Systems  Constitutional: Positive for appetite change and fatigue. Negative for chills and fever.  HENT: Negative for ear pain and sore throat.   Eyes: Negative for pain and visual disturbance.  Respiratory: Negative for cough and shortness of breath.   Cardiovascular: Negative for chest pain and palpitations.  Gastrointestinal: Positive for abdominal pain. Negative for constipation and vomiting.  Genitourinary: Negative for dysuria and hematuria.  Musculoskeletal: Negative for arthralgias and back pain.  Skin: Negative for color change and rash.  Neurological: Negative for seizures and syncope.  All other systems reviewed and are negative.    Physical Exam Updated Vital Signs BP 140/82   Pulse 90   Temp 98.1 F (36.7 C) (Oral)   Resp 18   Ht 6\' 6"  (1.981 m)   Wt 95.3 kg (210 lb)   SpO2 (!) 73%   BMI 24.27 kg/m   Physical Exam  Constitutional: He appears  well-developed.  HENT:  Head: Normocephalic and atraumatic.  Eyes: Conjunctivae are normal.  Neck: Neck supple.  Cardiovascular: Normal rate and regular rhythm.   No murmur heard. Pulmonary/Chest: Effort normal and breath sounds normal. No respiratory distress.  Abdominal: Soft. There is tenderness (mild LLQ TTP (pt states improved from earlier)).  Musculoskeletal: He exhibits no edema.  Neurological: He is alert. No cranial nerve deficit. Coordination normal.  5/5 motor strength and intact sensation in all extremities. Intact bilateral finger-to-nose coordination   Skin: Skin is warm and dry.  Psychiatric: He has a normal mood and affect.  Nursing note and vitals reviewed.    ED Treatments / Results  Labs (all labs ordered are listed, but only abnormal results are displayed) Labs Reviewed  COMPREHENSIVE METABOLIC PANEL - Abnormal; Notable for the following:       Result Value   Sodium 131 (*)    Chloride 96 (*)    Glucose, Bld 115 (*)    Creatinine, Ser 1.30 (*)    Calcium 8.5 (*)    Albumin 2.5 (*)    ALT 13 (*)    GFR calc non Af Amer 50 (*)    GFR calc Af Amer 58 (*)    All other components within normal limits  CBC - Abnormal; Notable for the following:    WBC 21.3 (*)    RBC 3.98 (*)    Hemoglobin 9.3 (*)    HCT 29.3 (*)    MCV 73.6 (*)    MCH 23.4 (*)    RDW 20.0 (*)    Platelets 602 (*)    All other components within normal limits  CULTURE, BLOOD (ROUTINE X 2)  CULTURE, BLOOD (ROUTINE X 2)  URINALYSIS, ROUTINE W REFLEX MICROSCOPIC  I-STAT CG4 LACTIC ACID, ED  POC OCCULT BLOOD, ED  TYPE AND SCREEN    EKG  EKG Interpretation None       Radiology Ct Abdomen Pelvis W Contrast  Result Date: 09/15/2017 CLINICAL DATA:  Left-sided abdominal pain. Diverticulitis suspected. EXAM: CT ABDOMEN AND PELVIS WITH CONTRAST TECHNIQUE: Multidetector CT imaging of the abdomen and pelvis was performed using the standard protocol following bolus administration of intravenous contrast. CONTRAST:  135mL ISOVUE-300 IOPAMIDOL (ISOVUE-300) INJECTION 61% COMPARISON:  None. FINDINGS: Lower chest: The lung bases are clear. Small hiatal hernia. Pericardial calcification adjacent to the right atrium. Hepatobiliary: Gallbladder physiologically distended, no calcified stone. No biliary dilatation. Pancreas: No ductal dilatation or inflammation. Spleen: Normal in size without focal abnormality. Adrenals/Urinary Tract: No adrenal nodule. No hydronephrosis. Lobular renal contours with symmetric perinephric edema. Only minimal excretion on delayed phase  imaging. Urinary bladder is physiologically distended. There is a right posterior bladder diverticulum currently measuring 5 cm. Stomach/Bowel: Pericolonic inflammation adjacent to the distal descending colon. There is a large irregular extraluminal collection containing air, small amount of fluid and probable stool with surrounding soft tissue stranding suspicious for pericolonic abscess. The borders are ill-defined, however measures approximately 5.7 x 6.1 x 5.9 cm. Communication with this collection in the descending colon is suspected, best demonstrated on coronal reformat image 60. There is associated colonic wall thickening proximal and distal to the extra colonic collection. No significant diverticular disease is seen elsewhere. Moderate stool in the more proximal colon with liquid stool in the cecum and ascending colon. Normal appendix tentatively identified. There is no small bowel dilatation or inflammation. Small hiatal hernia, stomach is decompressed. Vascular/Lymphatic: No definite pericolonic adenopathy, degree of inflammation partial limits assessment. Normal caliber abdominal aorta  with mild atherosclerosis. No pelvic adenopathy. Reproductive: Normal sized prostate gland. Other: Inflammatory change in the left lower abdomen is soft tissue stranding and trace fluid, no ascites elsewhere. There is no tracking free air. Musculoskeletal: There are no acute or suspicious osseous abnormalities. Multilevel degenerative change in the lumbar spine, degenerative change in both hips. IMPRESSION: 1. Large inflammatory pericolonic ill-defined heterogeneous collection arising from the distal descending colon containing air, fluid and possible stool. Colonic wall thickening just proximal distal. Differential considerations include diverticulitis with abscess versus perforated colonic malignancy. No significant diverticular disease is seen elsewhere in the colon, which increases concern for malignancy. 2.  Perinephric edema and diminished renal excretion on delayed phase imaging suggesting underlying renal disease. 3. Hepatic granuloma.  No focal hepatic mass. 4.  Aortic Atherosclerosis (ICD10-I70.0). These results were called by telephone at the time of interpretation on 09/15/2017 at 11:25 pm to Dr. Thomasene Lot , who verbally acknowledged these results. Electronically Signed   By: Jeb Levering M.D.   On: 09/15/2017 23:26    Procedures Procedures (including critical care time)  Medications Ordered in ED Medications  ciprofloxacin (CIPRO) IVPB 400 mg (400 mg Intravenous New Bag/Given 09/16/17 0025)    And  metroNIDAZOLE (FLAGYL) IVPB 500 mg (500 mg Intravenous New Bag/Given 09/16/17 0030)  sodium chloride 0.9 % bolus 500 mL (0 mLs Intravenous Stopped 09/15/17 2223)  0.9 %  sodium chloride infusion ( Intravenous New Bag/Given 09/15/17 2139)  iopamidol (ISOVUE-300) 61 % injection (100 mLs  Contrast Given 09/15/17 2246)     Initial Impression / Assessment and Plan / ED Course  I have reviewed the triage vital signs and the nursing notes.  Pertinent labs & imaging results that were available during my care of the patient were reviewed by me and considered in my medical decision making (see chart for details).     37 yoM h/o gastric ulcers and anemia who p/w 1 week of worsening LLQ ab pain and decreased appetite. Mild TTP of LLQ (pt states severe TTP during earlier evaluation at [facility]). Lungs CTAB. Labs significant for WBC 21k. CMP notable for mild dehydration and AKI. Gentle IVF given.  CT A/P showing large inflammatory pericolonic ill-defined heterogeneous collection concerning for diverticulitis w/ abscess vs perforated colonic malignancy. Gen surgery consulted and evaluated pt. Cipro/flagyl given.  Pt states improved pain and sx on reassessment. Pt admitted for further management and evaluation. Pt stable at time of transfer.   Pt care d/w Dr. Thomasene Lot  Final Clinical Impressions(s) / ED Diagnoses    Final diagnoses:  Diverticular disease of intestine with perforation and abscess  Abdominal pain, acute, left lower quadrant    New Prescriptions New Prescriptions   No medications on file     Payton Emerald, MD 09/16/17 0153    Macarthur Critchley, MD 09/16/17 [phone]

## 2017-09-15 NOTE — ED Triage Notes (Signed)
Pt sent here from urgent care with c/o gi bleed-- guiac positive -- pt missed 3 days of protonix and carafate-- last week.

## 2017-09-15 NOTE — Telephone Encounter (Signed)
Pls call patient regarding medicine, stomach pain and not eating

## 2017-09-15 NOTE — Telephone Encounter (Signed)
Pls callback medicine, not eating and stomach pain

## 2017-09-15 NOTE — Discharge Instructions (Signed)
To go to the emergency department now for evaluation of abdominal pain, blood in the stool and moderate to severe left lower quadrant abdominal tenderness.

## 2017-09-15 NOTE — Telephone Encounter (Signed)
Called pt - no answer; left message to give [place] a call back if needs assistance.

## 2017-09-15 NOTE — ED Notes (Signed)
Patient transported to CT 

## 2017-09-15 NOTE — Telephone Encounter (Signed)
Left message to call.

## 2017-09-15 NOTE — ED Notes (Signed)
Spoke to son in lobby.  Instructed to take patient to ed now.  Adult son agreeable

## 2017-09-15 NOTE — ED Triage Notes (Signed)
abd  Pain  Nausea          And  Hiccoughs     X       5   Days    Denies           Any  Vomiting  No  Diarrhea

## 2017-09-15 NOTE — ED Provider Notes (Signed)
[facility]    CSN: [id number] Arrival date & time: 09/15/17  1352     History   Chief Complaint Chief Complaint  Patient presents with  . Abdominal Pain    HPI Rontavious Albright is a 82 y.o. male.   82 year old male states that 3-4 days ago he developed left lower abdominal pain that is been increasing in intensity, is constant. He said decrease oral also of appetite for the past 5 days. He is eating very little but is able to drink water. He is feeling fatigued and he has having hiccups. Last month he had an EGD and was found to have esophageal and gastric ulcers. He has a history of chronic gastritis and upper abdominal pain. He has had no bowel movement in the past 3-4 days.      Past Medical History:  Diagnosis Date  . Anemia     Patient Active Problem List   Diagnosis Date Noted  . Acute gastric ulcer with hemorrhage   . Multiple gastric ulcers   . Anemia due to chronic blood loss   . GI bleed 07/23/2017  . Microcytic anemia 07/23/2017    Past Surgical History:  Procedure Laterality Date  . ESOPHAGOGASTRODUODENOSCOPY (EGD) WITH PROPOFOL N/A 07/25/2017   Procedure: ESOPHAGOGASTRODUODENOSCOPY (EGD) WITH PROPOFOL;  Surgeon: Mauri Pole, MD;  Location: [facility];  Service: Endoscopy;  Laterality: N/A;       Home Medications    Prior to Admission medications   Medication Sig Start Date End Date Taking? Authorizing Provider  ferrous sulfate 325 (65 FE) MG tablet Take 1 tablet (325 mg total) by mouth daily with breakfast. 07/27/17   Tawny Asal, MD  Multiple Vitamins-Minerals (ADULT ONE DAILY GUMMIES) CHEW Chew 2 tablets by mouth daily.    [provider]  pantoprazole (PROTONIX) 40 MG tablet TAKE 1 TABLET BY MOUTH TWICE DAILY 09/10/17   Lorella Nimrod, MD  sucralfate (CARAFATE) 1 GM/10ML suspension Take 10 mLs (1 g total) by mouth 4 (four) times daily -  with meals and at bedtime. 07/26/17   Tawny Asal, MD    Family History Family  History  Problem Relation Age of Onset  . Arthritis Mother   . Arthritis Father   . Heart disease Neg Hx   . Kidney disease Neg Hx   . Diabetes Neg Hx     Social History Social History  Substance Use Topics  . Smoking status: Never Smoker  . Smokeless tobacco: Never Used  . Alcohol use No     Allergies   Patient has no known allergies.   Review of Systems Review of Systems  Constitutional: Positive for activity change, appetite change and fatigue. Negative for fever.  HENT: Negative.   Respiratory: Negative.   Cardiovascular: Negative.  Negative for chest pain.  Gastrointestinal: Positive for abdominal pain. Negative for abdominal distention, rectal pain and vomiting.  Genitourinary: Negative.   Musculoskeletal: Negative.   Skin: Negative.   Neurological: Negative.   All other systems reviewed and are negative.    Physical Exam Triage Vital Signs ED Triage Vitals  Enc Vitals Group     BP 09/15/17 1445 117/82     Pulse Rate 09/15/17 1445 78     Resp 09/15/17 1445 18     Temp 09/15/17 1445 98.6 F (37 C)     Temp Source 09/15/17 1445 Oral     SpO2 09/15/17 1445 99 %     Weight --      Height --  Head Circumference --      Peak Flow --      Pain Score 09/15/17 1447 8     Pain Loc --      Pain Edu? --      Excl. in [place]? --    No data found.   Updated Vital Signs BP 117/82 (BP Location: Right Arm)   Pulse 78   Temp 98.6 F (37 C) (Oral)   Resp 18   SpO2 99%   Visual Acuity Right Eye Distance:   Left Eye Distance:   Bilateral Distance:    Right Eye Near:   Left Eye Near:    Bilateral Near:     Physical Exam  Constitutional: He is oriented to person, place, and time. He appears well-developed and well-nourished. No distress.  HENT:  Head: Normocephalic and atraumatic.  Cardiovascular: Normal rate.   Initial cardiac exam while patient was sitting demonstrated a rapid  irregular heartbeat as well as premature beats. A few minutes later when  the patient was supine the heart rhythm was regular with an occasional premature beat.  Pulmonary/Chest: Effort normal and breath sounds normal. No respiratory distress.  Abdominal: Soft. Bowel sounds are normal. There is tenderness.  Positive for moderate to severe left lower quadrant abdominal tenderness with guarding. Remainder the abdomen is soft and nontender.  Genitourinary: Rectal exam shows guaiac positive stool.  Neurological: He is alert and oriented to person, place, and time.  Skin: Skin is warm and dry.  Psychiatric: He has a normal mood and affect.  Nursing note and vitals reviewed.    UC Treatments / Results  Labs (all labs ordered are listed, but only abnormal results are displayed) Labs Reviewed  OCCULT BLOOD, POC DEVICE - Abnormal; Notable for the following:       Result Value   Fecal Occult Bld POSITIVE (*)    All other components within normal limits    EKG  EKG Interpretation None       Radiology No results found.  Procedures Procedures (including critical care time)  Medications Ordered in UC Medications - No data to display   Initial Impression / Assessment and Plan / UC Course  I have reviewed the triage vital signs and the nursing notes.  Pertinent labs & imaging results that were available during my care of the patient were reviewed by me and considered in my medical decision making (see chart for details).    To go to the emergency department now for evaluation of abdominal pain, blood in the stool and moderate to severe left lower quadrant abdominal tenderness.     Final Clinical Impressions(s) / UC Diagnoses   Final diagnoses:  Left lower quadrant pain  Abdominal tenderness of left lower quadrant, rebound tenderness presence not specified  Loss of appetite  Heme positive stool    New Prescriptions New Prescriptions   No medications on file     Controlled Substance Prescriptions  Controlled Substance Registry consulted?  Not Applicable   Janne Napoleon, NP 09/15/17 1546

## 2017-09-16 ENCOUNTER — Encounter (HOSPITAL_COMMUNITY): Payer: Self-pay | Admitting: Family Medicine

## 2017-09-16 ENCOUNTER — Inpatient Hospital Stay (HOSPITAL_COMMUNITY): Payer: Medicare Other

## 2017-09-16 DIAGNOSIS — Z23 Encounter for immunization: Secondary | ICD-10-CM | POA: Diagnosis not present

## 2017-09-16 DIAGNOSIS — I493 Ventricular premature depolarization: Secondary | ICD-10-CM | POA: Diagnosis not present

## 2017-09-16 DIAGNOSIS — D500 Iron deficiency anemia secondary to blood loss (chronic): Secondary | ICD-10-CM | POA: Diagnosis not present

## 2017-09-16 DIAGNOSIS — K5720 Diverticulitis of large intestine with perforation and abscess without bleeding: Principal | ICD-10-CM | POA: Diagnosis present

## 2017-09-16 DIAGNOSIS — E871 Hypo-osmolality and hyponatremia: Secondary | ICD-10-CM | POA: Diagnosis present

## 2017-09-16 DIAGNOSIS — N179 Acute kidney failure, unspecified: Secondary | ICD-10-CM | POA: Diagnosis not present

## 2017-09-16 DIAGNOSIS — K5780 Diverticulitis of intestine, part unspecified, with perforation and abscess without bleeding: Secondary | ICD-10-CM | POA: Diagnosis not present

## 2017-09-16 DIAGNOSIS — N400 Enlarged prostate without lower urinary tract symptoms: Secondary | ICD-10-CM | POA: Diagnosis not present

## 2017-09-16 DIAGNOSIS — K279 Peptic ulcer, site unspecified, unspecified as acute or chronic, without hemorrhage or perforation: Secondary | ICD-10-CM | POA: Diagnosis present

## 2017-09-16 DIAGNOSIS — E861 Hypovolemia: Secondary | ICD-10-CM | POA: Diagnosis present

## 2017-09-16 DIAGNOSIS — R1032 Left lower quadrant pain: Secondary | ICD-10-CM | POA: Diagnosis present

## 2017-09-16 DIAGNOSIS — K5721 Diverticulitis of large intestine with perforation and abscess with bleeding: Secondary | ICD-10-CM

## 2017-09-16 DIAGNOSIS — Z79899 Other long term (current) drug therapy: Secondary | ICD-10-CM | POA: Diagnosis not present

## 2017-09-16 DIAGNOSIS — D509 Iron deficiency anemia, unspecified: Secondary | ICD-10-CM | POA: Diagnosis not present

## 2017-09-16 DIAGNOSIS — Z8711 Personal history of peptic ulcer disease: Secondary | ICD-10-CM | POA: Diagnosis not present

## 2017-09-16 DIAGNOSIS — R7989 Other specified abnormal findings of blood chemistry: Secondary | ICD-10-CM | POA: Diagnosis present

## 2017-09-16 DIAGNOSIS — I951 Orthostatic hypotension: Secondary | ICD-10-CM | POA: Diagnosis present

## 2017-09-16 DIAGNOSIS — E876 Hypokalemia: Secondary | ICD-10-CM | POA: Diagnosis not present

## 2017-09-16 LAB — URINALYSIS, ROUTINE W REFLEX MICROSCOPIC
Bilirubin Urine: NEGATIVE
GLUCOSE, UA: NEGATIVE mg/dL
HGB URINE DIPSTICK: NEGATIVE
KETONES UR: 5 mg/dL — AB
Nitrite: NEGATIVE
PROTEIN: NEGATIVE mg/dL
Specific Gravity, Urine: >1.046 — ABNORMAL HIGH (ref 1.005–1.030)
pH: 5 (ref 5.0–8.0)

## 2017-09-16 LAB — BASIC METABOLIC PANEL
ANION GAP: 9 (ref 5–15)
BUN: 11 mg/dL (ref 6–20)
CALCIUM: 7.8 mg/dL — AB (ref 8.9–10.3)
CO2: 25 mmol/L (ref 22–32)
Chloride: 98 mmol/L — ABNORMAL LOW (ref 101–111)
Creatinine, Ser: 1.02 mg/dL (ref 0.61–1.24)
Glucose, Bld: 103 mg/dL — ABNORMAL HIGH (ref 65–99)
Potassium: 3.5 mmol/L (ref 3.5–5.1)
SODIUM: 132 mmol/L — AB (ref 135–145)

## 2017-09-16 LAB — CBC WITH DIFFERENTIAL/PLATELET
BASOS ABS: 0 10*3/uL (ref 0.0–0.1)
BASOS PCT: 0 %
EOS ABS: 0.1 10*3/uL (ref 0.0–0.7)
Eosinophils Relative: 0 %
HEMATOCRIT: 26.3 % — AB (ref 39.0–52.0)
HEMOGLOBIN: 8.2 g/dL — AB (ref 13.0–17.0)
Lymphocytes Relative: 5 %
Lymphs Abs: 1 10*3/uL (ref 0.7–4.0)
MCH: 22.9 pg — ABNORMAL LOW (ref 26.0–34.0)
MCHC: 31.2 g/dL (ref 30.0–36.0)
MCV: 73.5 fL — ABNORMAL LOW (ref 78.0–100.0)
Monocytes Absolute: 1.2 10*3/uL — ABNORMAL HIGH (ref 0.1–1.0)
Monocytes Relative: 6 %
NEUTROS ABS: 17.4 10*3/uL — AB (ref 1.7–7.7)
Neutrophils Relative %: 89 %
Platelets: 523 10*3/uL — ABNORMAL HIGH (ref 150–400)
RBC: 3.58 MIL/uL — AB (ref 4.22–5.81)
RDW: 19.4 % — AB (ref 11.5–15.5)
WBC: 19.6 10*3/uL — AB (ref 4.0–10.5)

## 2017-09-16 LAB — PROTIME-INR
INR: 1.18
Prothrombin Time: 14.9 seconds (ref 11.4–15.2)

## 2017-09-16 MED ORDER — LIDOCAINE HCL 1 % IJ SOLN
INTRAMUSCULAR | Status: AC
  Filled 2017-09-16: qty 20

## 2017-09-16 MED ORDER — FENTANYL CITRATE (PF) 100 MCG/2ML IJ SOLN
INTRAMUSCULAR | Status: AC
  Filled 2017-09-16: qty 2

## 2017-09-16 MED ORDER — ONDANSETRON HCL 4 MG/2ML IJ SOLN
4.0000 mg | Freq: Four times a day (QID) | INTRAMUSCULAR | Status: DC | PRN
  Administered 2017-09-16: 4 mg via INTRAVENOUS
  Filled 2017-09-16: qty 2

## 2017-09-16 MED ORDER — SODIUM CHLORIDE 0.9 % IV SOLN: INTRAVENOUS | Status: DC

## 2017-09-16 MED ORDER — ACETAMINOPHEN 325 MG PO TABS
650.0000 mg | ORAL_TABLET | Freq: Four times a day (QID) | ORAL | Status: DC | PRN
  Administered 2017-09-16 – 2017-09-21 (×4): 650 mg via ORAL
  Filled 2017-09-16 (×5): qty 2

## 2017-09-16 MED ORDER — MIDAZOLAM HCL 2 MG/2ML IJ SOLN
INTRAMUSCULAR | Status: AC
  Filled 2017-09-16: qty 2

## 2017-09-16 MED ORDER — FENTANYL CITRATE (PF) 100 MCG/2ML IJ SOLN: 25.0000 ug | INTRAMUSCULAR | Status: DC | PRN

## 2017-09-16 MED ORDER — INFLUENZA VAC SPLIT HIGH-DOSE 0.5 ML IM SUSY
0.5000 mL | PREFILLED_SYRINGE | INTRAMUSCULAR | Status: AC
  Administered 2017-09-18: 0.5 mL via INTRAMUSCULAR
  Filled 2017-09-16: qty 0.5

## 2017-09-16 MED ORDER — SODIUM CHLORIDE 0.9 % IV SOLN
INTRAVENOUS | Status: DC
  Administered 2017-09-16 – 2017-09-17 (×2): via INTRAVENOUS

## 2017-09-16 MED ORDER — SODIUM CHLORIDE 0.9 % IV SOLN
INTRAVENOUS | Status: DC
  Administered 2017-09-16: 04:00:00 via INTRAVENOUS

## 2017-09-16 MED ORDER — FENTANYL CITRATE (PF) 100 MCG/2ML IJ SOLN
INTRAMUSCULAR | Status: AC | PRN
  Administered 2017-09-16: 50 ug via INTRAVENOUS

## 2017-09-16 MED ORDER — PANTOPRAZOLE SODIUM 40 MG IV SOLR
40.0000 mg | Freq: Two times a day (BID) | INTRAVENOUS | Status: DC
  Administered 2017-09-16 – 2017-09-18 (×5): 40 mg via INTRAVENOUS
  Filled 2017-09-16 (×5): qty 40

## 2017-09-16 MED ORDER — SODIUM CHLORIDE 0.9% FLUSH
5.0000 mL | Freq: Three times a day (TID) | INTRAVENOUS | Status: DC
  Administered 2017-09-18 – 2017-09-21 (×9): 5 mL via INTRAVENOUS

## 2017-09-16 MED ORDER — ACETAMINOPHEN 650 MG RE SUPP: 650.0000 mg | Freq: Four times a day (QID) | RECTAL | Status: DC | PRN

## 2017-09-16 MED ORDER — MIDAZOLAM HCL 2 MG/2ML IJ SOLN
INTRAMUSCULAR | Status: AC | PRN
  Administered 2017-09-16: 1 mg via INTRAVENOUS

## 2017-09-16 MED ORDER — PIPERACILLIN-TAZOBACTAM 3.375 G IVPB
3.3750 g | Freq: Three times a day (TID) | INTRAVENOUS | Status: DC
  Administered 2017-09-16 – 2017-09-20 (×13): 3.375 g via INTRAVENOUS
  Filled 2017-09-16 (×14): qty 50

## 2017-09-16 MED ORDER — ONDANSETRON HCL 4 MG PO TABS: 4.0000 mg | ORAL_TABLET | Freq: Four times a day (QID) | ORAL | Status: DC | PRN

## 2017-09-16 NOTE — Progress Notes (Signed)
Patient ID: Roberto Owen, male   DOB: 1935-06-10, 82 y.o.   MRN: [id number] Patient was admitted early this morning for diverticulitis with contained perforation and abscess and started on broad-spectrum antibiotics. Surgery has evaluated the patient and has requested IR guided drainage. I reviewed patient's prior medical records and history and physical from this morning. I had seen the patient at bedside and examined him and explained the plan of care. Continue IV antibiotics for now. Await IR guided drainage. Repeat a.m. labs.

## 2017-09-16 NOTE — Progress Notes (Signed)
1715 Received pt back from CT, A&O x4, denies pain. Post CT guided drainage by percutaneous catheter. Cath site with dressing dry and intact, connected to bag placed to gravity.

## 2017-09-16 NOTE — Progress Notes (Signed)
Pharmacy Antibiotic Note  Roberto Owen is a 82 y.o. male admitted on 09/15/2017 with intra-abdominal infection.  Pharmacy has been consulted for Zosyn dosing. WBC elevated. CrCl ~55-60. CT abdomen with the following: Large inflammatory pericolonic ill-defined heterogeneous collection arising from the distal descending colon containing air, fluid and possible stool  Plan: Zosyn 3.375G IV q8h to be infused over 4 hours Trend WBC, temp, renal function  F/U infectious work-up  Height: 6\' 6"  (198.1 cm) Weight: 210 lb (95.3 kg) IBW/kg (Calculated) : 91.4  Temp (24hrs), Avg:98.4 F (36.9 C), Min:98.1 F (36.7 C), Max:98.6 F (37 C)   Recent Labs Lab 09/15/17 1801 09/15/17 2352  WBC 21.3*  --   CREATININE 1.30*  --   LATICACIDVEN  --  1.05    Estimated Creatinine Clearance: 58.6 mL/min (A) (by C-G formula based on SCr of 1.3 mg/dL (H)).    No Known Allergies  Roberto Owen 09/16/2017 2:12 AM

## 2017-09-16 NOTE — Consult Note (Signed)
Chief Complaint: Patient was seen in consultation today for abdominal abscess  Referring Physician(s):  Dr. Fanny Skates  Supervising Physician: Corrie Mckusick  Patient Status: [facility] - In-pt  History of Present Illness: Roberto Owen is a 82 y.o. male with past medical history of peptic ulcer, GI bleed, and anemia presented to [facility] ED with abdominal pain, nausea, and vomiting.    CT Abdomen 09/15/17 showed; 1. Large inflammatory pericolonic ill-defined heterogeneous collection arising from the distal descending colon containing air, fluid and possible stool. Colonic wall thickening just proximal distal. Differential considerations include diverticulitis with abscess versus perforated colonic malignancy. No significant diverticular disease is seen elsewhere in the colon, which increases concern for malignancy. 2. Perinephric edema and diminished renal excretion on delayed phase imaging suggesting underlying renal disease. 3. Hepatic granuloma.  No focal hepatic mass. 4.  Aortic Atherosclerosis (ICD10-I70.0). These results were called by telephone at the time of interpretation on 09/15/2017 at 11:25 pm to Dr. Thomasene Lot , who verbally acknowledged these results.  IR consulted for aspiration and drainage of possible abscess.  Case reviewed and approved by Dr. Earleen Newport.   Patient has been NPO.  He does not take blood thinners.   Past Medical History:  Diagnosis Date  . Anemia   . GI bleed     Past Surgical History:  Procedure Laterality Date  . ESOPHAGOGASTRODUODENOSCOPY (EGD) WITH PROPOFOL N/A 07/25/2017   Procedure: ESOPHAGOGASTRODUODENOSCOPY (EGD) WITH PROPOFOL;  Surgeon: Mauri Pole, MD;  Location: [facility];  Service: Endoscopy;  Laterality: N/A;    Allergies: Patient has no known allergies.  Medications: Prior to Admission medications   Medication Sig Start Date End Date Taking? Authorizing Provider  ferrous sulfate 325 (65 FE) MG tablet Take 1 tablet (325  mg total) by mouth daily with breakfast. 07/27/17  Yes Tawny Asal, MD  Multiple Vitamins-Minerals (ADULT ONE DAILY GUMMIES) CHEW Chew 1 tablet by mouth daily.    Yes [provider]  pantoprazole (PROTONIX) 40 MG tablet TAKE 1 TABLET BY MOUTH TWICE DAILY 09/10/17  Yes Lorella Nimrod, MD     Family History  Problem Relation Age of Onset  . Arthritis Mother   . Arthritis Father   . Heart disease Neg Hx   . Kidney disease Neg Hx   . Diabetes Neg Hx     Social History   Social History  . Marital status: Divorced    Spouse name: N/A  . Number of children: N/A  . Years of education: N/A   Social History Main Topics  . Smoking status: Never Smoker  . Smokeless tobacco: Never Used  . Alcohol use No  . Drug use: No  . Sexual activity: Not Asked   Other Topics Concern  . None   Social History Narrative  . None    Review of Systems  Constitutional: Negative for fatigue and fever.  Respiratory: Negative for cough and shortness of breath.   Cardiovascular: Negative for chest pain.  Gastrointestinal: Positive for abdominal pain and nausea.  Psychiatric/Behavioral: Negative for behavioral problems and confusion.    Vital Signs: BP 103/68 (BP Location: Left Arm)   Pulse 81   Temp 99.6 F (37.6 C) (Oral)   Resp 19   Ht 6\' 6"  (1.981 m)   Wt 202 lb 4.8 oz (91.8 kg)   SpO2 98%   BMI 23.38 kg/m   Physical Exam  Constitutional: He is oriented to person, place, and time. He appears well-developed.  Cardiovascular: Normal rate, regular rhythm and normal  heart sounds.   Pulmonary/Chest: Effort normal and breath sounds normal. No respiratory distress.  Abdominal: Soft. He exhibits no distension. There is tenderness.  Neurological: He is alert and oriented to person, place, and time.  Skin: Skin is warm and dry.  Psychiatric: He has a normal mood and affect. His behavior is normal. Judgment and thought content normal.  Nursing note and vitals reviewed.   Imaging: Ct  Abdomen Pelvis W Contrast  Result Date: 09/15/2017 CLINICAL DATA:  Left-sided abdominal pain. Diverticulitis suspected. EXAM: CT ABDOMEN AND PELVIS WITH CONTRAST TECHNIQUE: Multidetector CT imaging of the abdomen and pelvis was performed using the standard protocol following bolus administration of intravenous contrast. CONTRAST:  170mL ISOVUE-300 IOPAMIDOL (ISOVUE-300) INJECTION 61% COMPARISON:  None. FINDINGS: Lower chest: The lung bases are clear. Small hiatal hernia. Pericardial calcification adjacent to the right atrium. Hepatobiliary: Gallbladder physiologically distended, no calcified stone. No biliary dilatation. Pancreas: No ductal dilatation or inflammation. Spleen: Normal in size without focal abnormality. Adrenals/Urinary Tract: No adrenal nodule. No hydronephrosis. Lobular renal contours with symmetric perinephric edema. Only minimal excretion on delayed phase imaging. Urinary bladder is physiologically distended. There is a right posterior bladder diverticulum currently measuring 5 cm. Stomach/Bowel: Pericolonic inflammation adjacent to the distal descending colon. There is a large irregular extraluminal collection containing air, small amount of fluid and probable stool with surrounding soft tissue stranding suspicious for pericolonic abscess. The borders are ill-defined, however measures approximately 5.7 x 6.1 x 5.9 cm. Communication with this collection in the descending colon is suspected, best demonstrated on coronal reformat image 60. There is associated colonic wall thickening proximal and distal to the extra colonic collection. No significant diverticular disease is seen elsewhere. Moderate stool in the more proximal colon with liquid stool in the cecum and ascending colon. Normal appendix tentatively identified. There is no small bowel dilatation or inflammation. Small hiatal hernia, stomach is decompressed. Vascular/Lymphatic: No definite pericolonic adenopathy, degree of inflammation  partial limits assessment. Normal caliber abdominal aorta with mild atherosclerosis. No pelvic adenopathy. Reproductive: Normal sized prostate gland. Other: Inflammatory change in the left lower abdomen is soft tissue stranding and trace fluid, no ascites elsewhere. There is no tracking free air. Musculoskeletal: There are no acute or suspicious osseous abnormalities. Multilevel degenerative change in the lumbar spine, degenerative change in both hips. IMPRESSION: 1. Large inflammatory pericolonic ill-defined heterogeneous collection arising from the distal descending colon containing air, fluid and possible stool. Colonic wall thickening just proximal distal. Differential considerations include diverticulitis with abscess versus perforated colonic malignancy. No significant diverticular disease is seen elsewhere in the colon, which increases concern for malignancy. 2. Perinephric edema and diminished renal excretion on delayed phase imaging suggesting underlying renal disease. 3. Hepatic granuloma.  No focal hepatic mass. 4.  Aortic Atherosclerosis (ICD10-I70.0). These results were called by telephone at the time of interpretation on 09/15/2017 at 11:25 pm to Dr. Thomasene Lot , who verbally acknowledged these results. Electronically Signed   By: Jeb Levering M.D.   On: 09/15/2017 23:26    Labs:  CBC:  Recent Labs  07/26/17 0631 08/12/17 1427 09/15/17 1801 09/16/17 0509  WBC 5.1 5.6 21.3* 19.6*  HGB 7.3* 9.0* 9.3* 8.2*  HCT 25.6* 30.6* 29.3* 26.3*  PLT 246 573.0* 602* 523*    COAGS:  Recent Labs  07/23/17 1328 09/16/17 0736  INR 1.09 1.18  APTT 27  --     BMP:  Recent Labs  07/25/17 1040 07/26/17 0631 09/15/17 1801 09/16/17 0509  NA 139 142 131* 132*  K 3.4* 3.9 3.8 3.5  CL 106 108 96* 98*  CO2 23 27 23 25   GLUCOSE 89 95 115* 103*  BUN 7 5* 11 11  CALCIUM 8.3* 8.1* 8.5* 7.8*  CREATININE 0.82 0.92 1.30* 1.02  GFRNONAA >60 >60 50* >60  GFRAA >60 >60 58* >60    LIVER  FUNCTION TESTS:  Recent Labs  07/23/17 1356 09/15/17 1801  BILITOT 0.6 0.3  AST 19 18  ALT 8* 13*  ALKPHOS 64 86  PROT 7.2 7.3  ALBUMIN 3.0* 2.5*    TUMOR MARKERS: No results for input(s): AFPTM, CEA, CA199, CHROMGRNA in the last 8760 hours.  Assessment and Plan: Abdominal fluid collection Patient admitted with abdominal pain, nausea, and vomiting.  CT imaging show possible abscess.  IR consulted for aspiration and drainage of fluid collection.  Case reviewed and approved by Dr. Earleen Newport.  Patient currently NPO.  He does not take blood thinners.  Risks and benefits discussed with the patient including bleeding, infection, damage to adjacent structures, bowel perforation/fistula connection, and sepsis. All of the patient's questions were answered, patient is agreeable to proceed. Consent signed and in chart.  Thank you for this interesting consult.  I greatly enjoyed meeting Roberto Owen and look forward to participating in their care.  A copy of this report was sent to the requesting provider on this date.  Electronically Signed: Docia Barrier, PA 09/16/2017, 9:33 AM   I spent a total of 40 Minutes    in face to face in clinical consultation, greater than 50% of which was counseling/coordinating care for abdominal fluid collection.

## 2017-09-16 NOTE — Sedation Documentation (Signed)
Patient is resting comfortably. 

## 2017-09-16 NOTE — H&P (Signed)
History and Physical    Roberto Owen RCV:[id number] DOB: [birth year] DOA: 09/15/2017  PCP: System, Pcp Not In; PCP in [place], pt is here visiting   Patient coming from: Home  Chief Complaint: Abd pain, nausea, loss of appetite  HPI: Roberto Owen is a 82 y.o. male with medical history significant for microcytic anemia and peptic ulcer disease, now presented to the emergency department for evaluation of 5 days of abdominal pain with nausea and loss of appetite. Patient was admitted to the hospital early last month with heme positive stool and microcytic anemia. He underwent EGD and was found to have nonbleeding esophageal ulcer and nonbleeding gastric ulcers. This was attributed to NSAID use. He has since stopped the NSAIDs, started Protonix, Carafate, and iron, and his hemoglobin has improved. He was doing well until 5 days ago when he noted the insidious development of severe pain in the left lower quadrant. There was associated nausea, but no vomiting or diarrhea. He has had loss of appetite over the same interval. Has not had a bowel movement in the last 3-4 days. He went to an urgent care for evaluation of this and was directed to the ED. He reports undergoing a colonoscopy 1 year ago and was told that it was normal.  ED Course: Upon arrival to the ED, patient is found to be afebrile, saturating well on room air, and with vitals otherwise stable. There was a 31 mmHg drop in systolic blood pressure from lying to standing. EKG features a sinus rhythm with PVCs. Chemistry panel reveals a sodium of 131 and creatinine 1.30, up from 0.9 last month. CBC is notable for a new leukocytosis to 21,300 and a stable microcytic anemia with hemoglobin of 9.3. Fecal occult blood testing is positive. Lactic acid is reassuring at 1.05. CT of the abdomen and pelvis was performed and reveals a large inflammatory pericolonic ill-defined heterogenous collection arising from the distal descending colon and containing  air, fluid, and possible stool. Colonic wall thickening is noted just proximal and distal to this lesion. Surgery was consulted by the ED physician, evaluated patient in the emergency department, and recommended a medical admission with IV antibiotics and consultation with IR for percutaneous drainage. Patient remained hemodynamically stable in the ED, has not been in any apparent respiratory distress, and will be admitted to the medical surgical unit for ongoing evaluation and management of complicated diverticulitis.  Review of Systems:  All other systems reviewed and apart from HPI, are negative.  Past Medical History:  Diagnosis Date  . Anemia   . GI bleed     Past Surgical History:  Procedure Laterality Date  . ESOPHAGOGASTRODUODENOSCOPY (EGD) WITH PROPOFOL N/A 07/25/2017   Procedure: ESOPHAGOGASTRODUODENOSCOPY (EGD) WITH PROPOFOL;  Surgeon: Mauri Pole, MD;  Location: [facility];  Service: Endoscopy;  Laterality: N/A;     reports that he has never smoked. He has never used smokeless tobacco. He reports that he does not drink alcohol or use drugs.  No Known Allergies  Family History  Problem Relation Age of Onset  . Arthritis Mother   . Arthritis Father   . Heart disease Neg Hx   . Kidney disease Neg Hx   . Diabetes Neg Hx      Prior to Admission medications   Medication Sig Start Date End Date Taking? Authorizing Provider  ferrous sulfate 325 (65 FE) MG tablet Take 1 tablet (325 mg total) by mouth daily with breakfast. 07/27/17  Yes Tawny Asal, MD  Multiple Vitamins-Minerals (ADULT  ONE DAILY GUMMIES) CHEW Chew 1 tablet by mouth daily.    Yes [provider]  pantoprazole (PROTONIX) 40 MG tablet TAKE 1 TABLET BY MOUTH TWICE DAILY 09/10/17  Yes Lorella Nimrod, MD    Physical Exam: Vitals:   09/15/17 1734 09/15/17 1735 09/15/17 2118 09/16/17 0027  BP: 138/63  (!) 144/82 140/82  Pulse: 84  92 90  Resp: 18  18 18   Temp: 98.1 F (36.7 C)     TempSrc: Oral      SpO2: 100%  96% (!) 73%  Weight:  95.3 kg (210 lb)    Height:  6\' 6"  (1.981 m)        Constitutional: NAD, calm, appears uncomfortable Eyes: PERTLA, lids and conjunctivae normal ENMT: Mucous membranes are dry. Posterior pharynx clear of any exudate or lesions.   Neck: normal, supple, no masses, no thyromegaly Respiratory: clear to auscultation bilaterally, no wheezing, no crackles. Normal respiratory effort.  Cardiovascular: S1 & S2 heard, regular rate and rhythm. No extremity edema. No significant JVD. Abdomen: No distension, soft, tender in lower quadrants without rebound pain or guarding. Bowel sounds appreciated.  Musculoskeletal: no clubbing / cyanosis. No joint deformity upper and lower extremities.   Skin: no significant rashes, lesions, ulcers. Poor turgor. Neurologic: CN 2-12 grossly intact. Sensation intact. Strength 5/5 in all 4 limbs.  Psychiatric: Alert and oriented x 3. Pleasant, cooperative.     Labs on Admission: I have personally reviewed following labs and imaging studies  CBC:  Recent Labs Lab 09/15/17 1801  WBC 21.3*  HGB 9.3*  HCT 29.3*  MCV 73.6*  PLT 174*   Basic Metabolic Panel:  Recent Labs Lab 09/15/17 1801  NA 131*  K 3.8  CL 96*  CO2 23  GLUCOSE 115*  BUN 11  CREATININE 1.30*  CALCIUM 8.5*   GFR: Estimated Creatinine Clearance: 58.6 mL/min (A) (by C-G formula based on SCr of 1.3 mg/dL (H)). Liver Function Tests:  Recent Labs Lab 09/15/17 1801  AST 18  ALT 13*  ALKPHOS 86  BILITOT 0.3  PROT 7.3  ALBUMIN 2.5*   No results for input(s): LIPASE, AMYLASE in the last 168 hours. No results for input(s): AMMONIA in the last 168 hours. Coagulation Profile: No results for input(s): INR, PROTIME in the last 168 hours. Cardiac Enzymes: No results for input(s): CKTOTAL, CKMB, CKMBINDEX, TROPONINI in the last 168 hours. BNP (last 3 results) No results for input(s): PROBNP in the last 8760 hours. HbA1C: No results for input(s):  HGBA1C in the last 72 hours. CBG: No results for input(s): GLUCAP in the last 168 hours. Lipid Profile: No results for input(s): CHOL, HDL, LDLCALC, TRIG, CHOLHDL, LDLDIRECT in the last 72 hours. Thyroid Function Tests: No results for input(s): TSH, T4TOTAL, FREET4, T3FREE, THYROIDAB in the last 72 hours. Anemia Panel: No results for input(s): VITAMINB12, FOLATE, FERRITIN, TIBC, IRON, RETICCTPCT in the last 72 hours. Urine analysis:    Component Value Date/Time   COLORURINE YELLOW 07/23/2017 2236   APPEARANCEUR CLEAR 07/23/2017 2236   LABSPEC 1.017 07/23/2017 2236   PHURINE 5.0 07/23/2017 2236   GLUCOSEU NEGATIVE 07/23/2017 2236   HGBUR NEGATIVE 07/23/2017 2236   BILIRUBINUR NEGATIVE 07/23/2017 2236   KETONESUR NEGATIVE 07/23/2017 2236   PROTEINUR NEGATIVE 07/23/2017 2236   NITRITE NEGATIVE 07/23/2017 2236   LEUKOCYTESUR LARGE (A) 07/23/2017 2236   Sepsis Labs: @LABRCNTIP (procalcitonin:4,lacticidven:4) )No results found for this or any previous visit (from the past 240 hour(s)).   Radiological Exams on Admission: Ct Abdomen  Pelvis W Contrast  Result Date: 09/15/2017 CLINICAL DATA:  Left-sided abdominal pain. Diverticulitis suspected. EXAM: CT ABDOMEN AND PELVIS WITH CONTRAST TECHNIQUE: Multidetector CT imaging of the abdomen and pelvis was performed using the standard protocol following bolus administration of intravenous contrast. CONTRAST:  151mL ISOVUE-300 IOPAMIDOL (ISOVUE-300) INJECTION 61% COMPARISON:  None. FINDINGS: Lower chest: The lung bases are clear. Small hiatal hernia. Pericardial calcification adjacent to the right atrium. Hepatobiliary: Gallbladder physiologically distended, no calcified stone. No biliary dilatation. Pancreas: No ductal dilatation or inflammation. Spleen: Normal in size without focal abnormality. Adrenals/Urinary Tract: No adrenal nodule. No hydronephrosis. Lobular renal contours with symmetric perinephric edema. Only minimal excretion on delayed phase  imaging. Urinary bladder is physiologically distended. There is a right posterior bladder diverticulum currently measuring 5 cm. Stomach/Bowel: Pericolonic inflammation adjacent to the distal descending colon. There is a large irregular extraluminal collection containing air, small amount of fluid and probable stool with surrounding soft tissue stranding suspicious for pericolonic abscess. The borders are ill-defined, however measures approximately 5.7 x 6.1 x 5.9 cm. Communication with this collection in the descending colon is suspected, best demonstrated on coronal reformat image 60. There is associated colonic wall thickening proximal and distal to the extra colonic collection. No significant diverticular disease is seen elsewhere. Moderate stool in the more proximal colon with liquid stool in the cecum and ascending colon. Normal appendix tentatively identified. There is no small bowel dilatation or inflammation. Small hiatal hernia, stomach is decompressed. Vascular/Lymphatic: No definite pericolonic adenopathy, degree of inflammation partial limits assessment. Normal caliber abdominal aorta with mild atherosclerosis. No pelvic adenopathy. Reproductive: Normal sized prostate gland. Other: Inflammatory change in the left lower abdomen is soft tissue stranding and trace fluid, no ascites elsewhere. There is no tracking free air. Musculoskeletal: There are no acute or suspicious osseous abnormalities. Multilevel degenerative change in the lumbar spine, degenerative change in both hips. IMPRESSION: 1. Large inflammatory pericolonic ill-defined heterogeneous collection arising from the distal descending colon containing air, fluid and possible stool. Colonic wall thickening just proximal distal. Differential considerations include diverticulitis with abscess versus perforated colonic malignancy. No significant diverticular disease is seen elsewhere in the colon, which increases concern for malignancy. 2.  Perinephric edema and diminished renal excretion on delayed phase imaging suggesting underlying renal disease. 3. Hepatic granuloma.  No focal hepatic mass. 4.  Aortic Atherosclerosis (ICD10-I70.0). These results were called by telephone at the time of interpretation on 09/15/2017 at 11:25 pm to Dr. Thomasene Lot , who verbally acknowledged these results. Electronically Signed   By: Jeb Levering M.D.   On: 09/15/2017 23:26    EKG: Independently reviewed. Sinus rhythm, PVC's.   Assessment/Plan  1. Diverticulitis with contained perforation and abscess  - Pt presents with abdominal pain, loss of appetite, and nausea without vomiting  - Noted to have marked leukocytosis with normal lactate, no fever, and hemodynamic stability  - CT reveals inflammatory collection at the distal descending colon with air and fluid, concerning for diverticulitis with contained perforation and abscess, or possibly a perforated malignancy - Pt reports a normal colonoscopy about 1 year ago, done out of town and without records immediately available, but would seem to make a malignancy highly unlikely  - Gen surgery evaluated the patient in ED and is much appreciated, recommending IV abx and consultation with IR for percutaneous drainage  - Continue empiric abx with Zosyn monotherapy, consult IR for drainage, continue supportive care with IVF, prn analgesia    2. Acute kidney injury  - SCr is  1.30 on admission, up from 0.9 last month  - Likely a prerenal azotemia given his recent anorexia and hypovolemia  - He was given 500 cc NS in ED and will be continued on NS infusion  - Repeat chem panel in am   3. PUD  - Pt admitted last month with microcytic anemia and heme-positive stools  - EGD revealed non-bleeding ulcers in esophagus and stomach  - He was discharged with instructions to stop NSAID, start BID Protonix   - Continue PPI, will use IV while on bowel-rest   4. Microcytic anemia  - Hgb is 9.3 on admission with MCV  of 73.6  - Secondary to chronic GI blood-loss, has since stopped NSAID's and started PPI and iron  - Both Hgb and MCV improving with treatment   5. Hyponatremia - Serum sodium is 131 on admission in setting of hypovolemia  - Treated with 500 cc NS in ED and will be continued on NS infusion  - Repeat chem panel in am    6. Orthostasis  - Pt had a 31 mmHg drop in SBP going from lying to standing  - Likely secondary to hypovolemia in setting of 5 days of anorexia  - Continue IVF hydration    DVT prophylaxis: SCD's  Code Status: Full  Family Communication: Discussed with patient Disposition Plan: Admit to med-surg Consults called: Surgery Admission status: Inpatient    Vianne Bulls, MD [facility] Pager [phone]  If 7PM-7AM, please contact night-coverage [email or web address] Password [email or web address]  09/16/2017, 1:41 AM

## 2017-09-16 NOTE — Progress Notes (Signed)
Subjective: Alert.  Stable.  Minimal distress.  Good historian. NPO On Cipro and Flagyl Waiting IR drainage of pericolonic abscess left lower quadrant  Interestingly, he reports normal colonoscopy in [place] one year ago. He was admitted 1 month ago for anemia and blood transfusion.  Upper endoscopy  By Dr. Silverio Decamp was remarkable for superficial, nonbleeding gastric ulcers without any stigmata of bleeding.Marland Kitchen He has lots of family here but his home is [place].  Objective: Vital signs in last 24 hours: Temp:  [98.1 F (36.7 C)-99.6 F (37.6 C)] 99.6 F (37.6 C) (09/27 0315) Pulse Rate:  [76-92] 81 (09/27 0315) Resp:  [18-24] 19 (09/27 0315) BP: (103-146)/(54-83) 103/68 (09/27 0315) SpO2:  [73 %-100 %] 98 % (09/27 0315) Weight:  [91.8 kg (202 lb 4.8 oz)-95.3 kg (210 lb)] 91.8 kg (202 lb 4.8 oz) (09/27 0315) Last BM Date: 09/15/17  Intake/Output from previous day: 09/26 0701 - 09/27 0700 In: 500 [IV Piggyback:500] Out: 200 [Urine:200] Intake/Output this shift: Total I/O In: 500 [IV Piggyback:500] Out: 200 [Urine:200]  General appearance: alert.  Cooperative.  Mental status normal.  No obvious distress Resp: clear to auscultation bilaterally GI: soft.  Nondistended.  Localized tenderness left lower quadrant.  No peritoneal signs.  Lab Results:   Recent Labs  09/15/17 1801  WBC 21.3*  HGB 9.3*  HCT 29.3*  PLT 602*   BMET  Recent Labs  09/15/17 1801  NA 131*  K 3.8  CL 96*  CO2 23  GLUCOSE 115*  BUN 11  CREATININE 1.30*  CALCIUM 8.5*   PT/INR No results for input(s): LABPROT, INR in the last 72 hours. ABG No results for input(s): PHART, HCO3 in the last 72 hours.  Invalid input(s): PCO2, PO2  Studies/Results: Ct Abdomen Pelvis W Contrast  Result Date: 09/15/2017 CLINICAL DATA:  Left-sided abdominal pain. Diverticulitis suspected. EXAM: CT ABDOMEN AND PELVIS WITH CONTRAST TECHNIQUE: Multidetector CT imaging of the abdomen and pelvis was  performed using the standard protocol following bolus administration of intravenous contrast. CONTRAST:  142mL ISOVUE-300 IOPAMIDOL (ISOVUE-300) INJECTION 61% COMPARISON:  None. FINDINGS: Lower chest: The lung bases are clear. Small hiatal hernia. Pericardial calcification adjacent to the right atrium. Hepatobiliary: Gallbladder physiologically distended, no calcified stone. No biliary dilatation. Pancreas: No ductal dilatation or inflammation. Spleen: Normal in size without focal abnormality. Adrenals/Urinary Tract: No adrenal nodule. No hydronephrosis. Lobular renal contours with symmetric perinephric edema. Only minimal excretion on delayed phase imaging. Urinary bladder is physiologically distended. There is a right posterior bladder diverticulum currently measuring 5 cm. Stomach/Bowel: Pericolonic inflammation adjacent to the distal descending colon. There is a large irregular extraluminal collection containing air, small amount of fluid and probable stool with surrounding soft tissue stranding suspicious for pericolonic abscess. The borders are ill-defined, however measures approximately 5.7 x 6.1 x 5.9 cm. Communication with this collection in the descending colon is suspected, best demonstrated on coronal reformat image 60. There is associated colonic wall thickening proximal and distal to the extra colonic collection. No significant diverticular disease is seen elsewhere. Moderate stool in the more proximal colon with liquid stool in the cecum and ascending colon. Normal appendix tentatively identified. There is no small bowel dilatation or inflammation. Small hiatal hernia, stomach is decompressed. Vascular/Lymphatic: No definite pericolonic adenopathy, degree of inflammation partial limits assessment. Normal caliber abdominal aorta with mild atherosclerosis. No pelvic adenopathy. Reproductive: Normal sized prostate gland. Other: Inflammatory change in the left lower abdomen is soft tissue stranding and  trace fluid, no ascites elsewhere. There  is no tracking free air. Musculoskeletal: There are no acute or suspicious osseous abnormalities. Multilevel degenerative change in the lumbar spine, degenerative change in both hips. IMPRESSION: 1. Large inflammatory pericolonic ill-defined heterogeneous collection arising from the distal descending colon containing air, fluid and possible stool. Colonic wall thickening just proximal distal. Differential considerations include diverticulitis with abscess versus perforated colonic malignancy. No significant diverticular disease is seen elsewhere in the colon, which increases concern for malignancy. 2. Perinephric edema and diminished renal excretion on delayed phase imaging suggesting underlying renal disease. 3. Hepatic granuloma.  No focal hepatic mass. 4.  Aortic Atherosclerosis (ICD10-I70.0). These results were called by telephone at the time of interpretation on 09/15/2017 at 11:25 pm to Dr. Thomasene Lot , who verbally acknowledged these results. Electronically Signed   By: Jeb Levering M.D.   On: 09/15/2017 23:26    Anti-infectives: Anti-infectives    Start     Dose/Rate Route Frequency Ordered Stop   09/16/17 0600  piperacillin-tazobactam (ZOSYN) IVPB 3.375 g     3.375 g 12.5 mL/hr over 240 Minutes Intravenous Every 8 hours 09/16/17 0215     09/15/17 2345  ciprofloxacin (CIPRO) IVPB 400 mg     400 mg 200 mL/hr over 60 Minutes Intravenous  Once 09/15/17 2336 09/16/17 0150   09/15/17 2345  metroNIDAZOLE (FLAGYL) IVPB 500 mg     500 mg 100 mL/hr over 60 Minutes Intravenous  Once 09/15/17 2336 09/16/17 0150      Assessment/Plan:   Acute diverticulitis with abscess.  Less likely perforated cancer    Proceed with IR drainage today    Continue broad-spectrum antibiotics     He is aware that if he does not respond percutaneous drainage she may need colectomy and colostomy, hopefully he will respond allowing elective outpatient workup including  colonoscopy  Recent EGD Recent blood transfusion for anemia Normal colonoscopy 1 year ago in [place] by history.    LOS: 0 days    Chessie Neuharth M 09/16/2017

## 2017-09-16 NOTE — Consult Note (Signed)
Reason for Consult:abdominal pain Referring Physician: Thomasene Lot MD  Roberto Owen is an 82 y.o. male.  HPI: Asked to see patient at the request of EDP for 5 day hx of left lower quadrant abdominal pain.  Sharp in nature with no radiation.  The pain is currently better.  Had EGD last month and showed PUD.  No report of colonoscopy.  CT shows diverticulitis with abscess.    Past Medical History:  Diagnosis Date  . Anemia   . GI bleed     Past Surgical History:  Procedure Laterality Date  . ESOPHAGOGASTRODUODENOSCOPY (EGD) WITH PROPOFOL N/A 07/25/2017   Procedure: ESOPHAGOGASTRODUODENOSCOPY (EGD) WITH PROPOFOL;  Surgeon: Roberto Pole, MD;  Location: [facility];  Service: Endoscopy;  Laterality: N/A;    Family History  Problem Relation Age of Onset  . Arthritis Mother   . Arthritis Father   . Heart disease Neg Hx   . Kidney disease Neg Hx   . Diabetes Neg Hx     Social History:  reports that he has never smoked. He has never used smokeless tobacco. He reports that he does not drink alcohol or use drugs.  Allergies: No Known Allergies  Medications: I have reviewed the patient's current medications.  Results for orders placed or performed during the hospital encounter of 09/15/17 (from the past 48 hour(s))  Type and screen [facility]     Status: None   Collection Time: 09/15/17  5:41 PM  Result Value Ref Range   ABO/RH(D) O POS    Antibody Screen NEG    Sample Expiration 09/18/2017   Comprehensive metabolic panel     Status: Abnormal   Collection Time: 09/15/17  6:01 PM  Result Value Ref Range   Sodium 131 (L) 135 - 145 mmol/L   Potassium 3.8 3.5 - 5.1 mmol/L   Chloride 96 (L) 101 - 111 mmol/L   CO2 23 22 - 32 mmol/L   Glucose, Bld 115 (H) 65 - 99 mg/dL   BUN 11 6 - 20 mg/dL   Creatinine, Ser 1.30 (H) 0.61 - 1.24 mg/dL   Calcium 8.5 (L) 8.9 - 10.3 mg/dL   Total Protein 7.3 6.5 - 8.1 g/dL   Albumin 2.5 (L) 3.5 - 5.0 g/dL   AST 18 15 - 41 U/L    ALT 13 (L) 17 - 63 U/L   Alkaline Phosphatase 86 38 - 126 U/L   Total Bilirubin 0.3 0.3 - 1.2 mg/dL   GFR calc non Af Amer 50 (L) >60 mL/min   GFR calc Af Amer 58 (L) >60 mL/min    Comment: (NOTE) The eGFR has been calculated using the CKD EPI equation. This calculation has not been validated in all clinical situations. eGFR's persistently <60 mL/min signify possible Chronic Kidney Disease.    Anion gap 12 5 - 15  CBC     Status: Abnormal   Collection Time: 09/15/17  6:01 PM  Result Value Ref Range   WBC 21.3 (H) 4.0 - 10.5 K/uL   RBC 3.98 (L) 4.22 - 5.81 MIL/uL   Hemoglobin 9.3 (L) 13.0 - 17.0 g/dL   HCT 29.3 (L) 39.0 - 52.0 %   MCV 73.6 (L) 78.0 - 100.0 fL   MCH 23.4 (L) 26.0 - 34.0 pg   MCHC 31.7 30.0 - 36.0 g/dL   RDW 20.0 (H) 11.5 - 15.5 %   Platelets 602 (H) 150 - 400 K/uL  I-Stat CG4 Lactic Acid, ED     Status: None  Collection Time: 09/15/17 11:52 PM  Result Value Ref Range   Lactic Acid, Venous 1.05 0.5 - 1.9 mmol/L    Ct Abdomen Pelvis W Contrast  Result Date: 09/15/2017 CLINICAL DATA:  Left-sided abdominal pain. Diverticulitis suspected. EXAM: CT ABDOMEN AND PELVIS WITH CONTRAST TECHNIQUE: Multidetector CT imaging of the abdomen and pelvis was performed using the standard protocol following bolus administration of intravenous contrast. CONTRAST:  135m ISOVUE-300 IOPAMIDOL (ISOVUE-300) INJECTION 61% COMPARISON:  None. FINDINGS: Lower chest: The lung bases are clear. Small hiatal hernia. Pericardial calcification adjacent to the right atrium. Hepatobiliary: Gallbladder physiologically distended, no calcified stone. No biliary dilatation. Pancreas: No ductal dilatation or inflammation. Spleen: Normal in size without focal abnormality. Adrenals/Urinary Tract: No adrenal nodule. No hydronephrosis. Lobular renal contours with symmetric perinephric edema. Only minimal excretion on delayed phase imaging. Urinary bladder is physiologically distended. There is a right posterior  bladder diverticulum currently measuring 5 cm. Stomach/Bowel: Pericolonic inflammation adjacent to the distal descending colon. There is a large irregular extraluminal collection containing air, small amount of fluid and probable stool with surrounding soft tissue stranding suspicious for pericolonic abscess. The borders are ill-defined, however measures approximately 5.7 x 6.1 x 5.9 cm. Communication with this collection in the descending colon is suspected, best demonstrated on coronal reformat image 60. There is associated colonic wall thickening proximal and distal to the extra colonic collection. No significant diverticular disease is seen elsewhere. Moderate stool in the more proximal colon with liquid stool in the cecum and ascending colon. Normal appendix tentatively identified. There is no small bowel dilatation or inflammation. Small hiatal hernia, stomach is decompressed. Vascular/Lymphatic: No definite pericolonic adenopathy, degree of inflammation partial limits assessment. Normal caliber abdominal aorta with mild atherosclerosis. No pelvic adenopathy. Reproductive: Normal sized prostate gland. Other: Inflammatory change in the left lower abdomen is soft tissue stranding and trace fluid, no ascites elsewhere. There is no tracking free air. Musculoskeletal: There are no acute or suspicious osseous abnormalities. Multilevel degenerative change in the lumbar spine, degenerative change in both hips. IMPRESSION: 1. Large inflammatory pericolonic ill-defined heterogeneous collection arising from the distal descending colon containing air, fluid and possible stool. Colonic wall thickening just proximal distal. Differential considerations include diverticulitis with abscess versus perforated colonic malignancy. No significant diverticular disease is seen elsewhere in the colon, which increases concern for malignancy. 2. Perinephric edema and diminished renal excretion on delayed phase imaging suggesting  underlying renal disease. 3. Hepatic granuloma.  No focal hepatic mass. 4.  Aortic Atherosclerosis (ICD10-I70.0). These results were called by telephone at the time of interpretation on 09/15/2017 at 11:25 pm to Dr. MThomasene Owen, who verbally acknowledged these results. Electronically Signed   By: MJeb LeveringM.D.   On: 09/15/2017 23:26    Review of Systems  Constitutional: Negative for chills and fever.  HENT: Negative for hearing loss and tinnitus.   Eyes: Negative for blurred vision and double vision.  Respiratory: Negative for cough and hemoptysis.   Cardiovascular: Negative for chest pain and palpitations.  Gastrointestinal: Positive for abdominal pain and blood in stool. Negative for heartburn.  Genitourinary: Negative for dysuria and urgency.  Skin: Negative for rash.  Neurological: Positive for dizziness. Negative for headaches.  Psychiatric/Behavioral: Negative for depression and suicidal ideas.   Blood pressure (!) 144/82, pulse 92, temperature 98.1 F (36.7 C), temperature source Oral, resp. rate 18, height '6\' 6"'  (1.981 m), weight 95.3 kg (210 lb), SpO2 96 %. Physical Exam  Constitutional: He is oriented to person, place, and time. He  appears well-developed and well-nourished.  HENT:  Head: Normocephalic and atraumatic.  Eyes: Pupils are equal, round, and reactive to light. EOM are normal.  Neck: Normal range of motion. Neck supple.  Cardiovascular: Normal rate.   Respiratory: Effort normal and breath sounds normal.  GI: Soft. He exhibits no mass. There is tenderness in the left lower quadrant. There is no rebound and no guarding.  Musculoskeletal: Normal range of motion.  Neurological: He is alert and oriented to person, place, and time.  Skin: Skin is warm and dry.    Assessment/Plan: Acute diverticulitis with abscess  IV ABX Recommend IR consult for drainge May need colonoscopy since malignancy cannot be excluded   Roberto Owen A. 09/16/2017, 12:09 AM

## 2017-09-16 NOTE — Care Management Note (Addendum)
Case Management Note  Patient Details  Name: Roberto Owen MRN: [id number] Date of Birth: 06-Mar-1935  Subjective/Objective:                    Action/Plan:  Will await PT recommendations   Update: no PT follow up , no DME recommendations. Bedside nurse teaching drain care.   No discharge needs identified at this time .  Patient here visiting from [place] Expected Discharge Date:                  Expected Discharge Plan:  [place]  In-House Referral:     Discharge planning Services  CM Consult  Post Acute Care Choice:  Home Health, Durable Medical Equipment Choice offered to:     DME Arranged:    DME Agency:     HH Arranged:    [place] Agency:     Status of Service:  In process, will continue to follow  If discussed at Long Length of Stay Meetings, dates discussed:    Additional Comments:  Roberto Favre, RN 09/16/2017, 3:34 PM

## 2017-09-16 NOTE — Procedures (Signed)
Interventional Radiology Procedure Note  Procedure:  CT guided abscess drain next to the sigmoid colon.  7F drain placed to gravity.  ~50cc drained.   Complications: None  Recommendations:  - Drain to gravity.  - Do not submerge  - Routine drain care - Routine wound care   Signed,  Dulcy Fanny. Earleen Newport, DO

## 2017-09-17 DIAGNOSIS — D509 Iron deficiency anemia, unspecified: Secondary | ICD-10-CM

## 2017-09-17 DIAGNOSIS — N179 Acute kidney failure, unspecified: Secondary | ICD-10-CM

## 2017-09-17 DIAGNOSIS — E871 Hypo-osmolality and hyponatremia: Secondary | ICD-10-CM

## 2017-09-17 LAB — CBC WITH DIFFERENTIAL/PLATELET
BASOS ABS: 0 10*3/uL (ref 0.0–0.1)
BASOS PCT: 0 %
EOS ABS: 0.2 10*3/uL (ref 0.0–0.7)
Eosinophils Relative: 2 %
HCT: 27.1 % — ABNORMAL LOW (ref 39.0–52.0)
HEMOGLOBIN: 8.3 g/dL — AB (ref 13.0–17.0)
LYMPHS PCT: 9 %
Lymphs Abs: 1.1 10*3/uL (ref 0.7–4.0)
MCH: 22.7 pg — ABNORMAL LOW (ref 26.0–34.0)
MCHC: 30.6 g/dL (ref 30.0–36.0)
MCV: 74.2 fL — ABNORMAL LOW (ref 78.0–100.0)
MONOS PCT: 7 %
Monocytes Absolute: 0.9 10*3/uL (ref 0.1–1.0)
NEUTROS PCT: 82 %
Neutro Abs: 10 10*3/uL — ABNORMAL HIGH (ref 1.7–7.7)
Platelets: 468 10*3/uL — ABNORMAL HIGH (ref 150–400)
RBC: 3.65 MIL/uL — ABNORMAL LOW (ref 4.22–5.81)
RDW: 19.5 % — ABNORMAL HIGH (ref 11.5–15.5)
WBC: 12.2 10*3/uL — ABNORMAL HIGH (ref 4.0–10.5)

## 2017-09-17 LAB — COMPREHENSIVE METABOLIC PANEL
ALBUMIN: 1.9 g/dL — AB (ref 3.5–5.0)
ALK PHOS: 61 U/L (ref 38–126)
ALT: 10 U/L — AB (ref 17–63)
AST: 19 U/L (ref 15–41)
Anion gap: 6 (ref 5–15)
BUN: 8 mg/dL (ref 6–20)
CALCIUM: 7.9 mg/dL — AB (ref 8.9–10.3)
CO2: 25 mmol/L (ref 22–32)
Chloride: 106 mmol/L (ref 101–111)
Creatinine, Ser: 0.86 mg/dL (ref 0.61–1.24)
GFR calc Af Amer: >60 mL/min (ref >60)
GFR calc non Af Amer: >60 mL/min (ref >60)
Glucose, Bld: 87 mg/dL (ref 65–99)
Potassium: 3.9 mmol/L (ref 3.5–5.1)
SODIUM: 137 mmol/L (ref 135–145)
Total Bilirubin: 0.6 mg/dL (ref 0.3–1.2)
Total Protein: 5.7 g/dL — ABNORMAL LOW (ref 6.5–8.1)

## 2017-09-17 LAB — MAGNESIUM: Magnesium: 1.8 mg/dL (ref 1.7–2.4)

## 2017-09-17 MED ORDER — DOCUSATE SODIUM 100 MG PO CAPS
200.0000 mg | ORAL_CAPSULE | Freq: Two times a day (BID) | ORAL | Status: DC
  Administered 2017-09-17 – 2017-09-21 (×6): 200 mg via ORAL
  Filled 2017-09-17 (×9): qty 2

## 2017-09-17 NOTE — Progress Notes (Signed)
Patient ID: Roberto Owen, male   DOB: 12/03/1935, 82 y.o.   MRN: [id number]  PROGRESS NOTE    Greg Cratty  WER:[id number] DOB: [birth year] DOA: 09/15/2017 PCP: System, Pcp Not In   Brief Narrative:  82 year old male with history of microcytic anemia and peptic ulcer disease presented on 09/15/2017 with abdominal pain and was found to have diverticulitis with probable abscess. IV antibiotics were started and general surgery was consulted. Patient underwent IR guided drain placement on 09/16/2017.   Assessment & Plan:   Principal Problem:   Diverticulitis of large intestine with abscess Active Problems:   Microcytic anemia   Anemia due to GI blood loss   Hyponatremia   PUD (peptic ulcer disease)   AKI (acute kidney injury) ([place])   Diverticulitis of intestine with abscess   Orthostasis   Diverticular disease of intestine with perforation and abscess   1. Acute diverticulitis with abscess with probable contained perforation - General surgery following. Status post IR guided drain placement on 09/16/2017. Follow cultures  - Continue Zosyn  - Patient might need outpatient GI evaluation for probable colonoscopy after completion of antibiotic treatment  - Diet advancement as per general surgery   2. Acute kidney injury  - Resolved   3. PUD  - Pt admitted last month with microcytic anemia and heme-positive stools  - EGD revealed non-bleeding ulcers in esophagus and stomach  -Continue Protonix twice a day  4. Microcytic anemia  - Hemoglobin stable. Outpatient follow-up with GI  5. Hyponatremia - Probably from hypovolemia. Resolved    6. Leukocytosis - Improving. Repeat a.m. Labs  7. Thrombocytosis - Probably reactive. Repeat a.m. labs   DVT prophylaxis: SCDs Code Status:  Full Family Communication: None at bedside Disposition Plan: Home in 2-3 days once cleared by surgery  Consultants: Gen. surgery and IR  Procedures: IR guided percutaneous drainage on  09/16/2017  Antimicrobials: Zosyn from 09/15/2017    Subjective: Patient seen and examined at bedside. He feels much better. His abdominal pain is improving. No overnight fever or vomiting  Objective: Vitals:   09/16/17 2032 09/17/17 0100 09/17/17 0440 09/17/17 1030  BP: (!) 111/56 (!) 118/58 120/63 127/63  Pulse: 73 74 66 94  Resp: 18 18  18   Temp: 98.8 F (37.1 C) 98.8 F (37.1 C) 98.2 F (36.8 C) 98.8 F (37.1 C)  TempSrc: Oral Oral Oral Oral  SpO2: 99% 97% 100% 100%  Weight:      Height:        Intake/Output Summary (Last 24 hours) at 09/17/17 1356 Last data filed at 09/17/17 0950  Gross per 24 hour  Intake          2018.75 ml  Output              950 ml  Net          1068.75 ml   Filed Weights   09/15/17 1735 09/16/17 0315  Weight: 95.3 kg (210 lb) 91.8 kg (202 lb 4.8 oz)    Examination:  General exam: Appears calm and comfortable  Respiratory system: Bilateral decreased breath sound at bases Cardiovascular system: S1 & S2 heard, Rate controlled.  Gastrointestinal system: Abdomen is nondistended, soft and mild left lower quadrant tenderness with drain present.  Extremities: No cyanosis, clubbing, edema    Data Reviewed: I have personally reviewed following labs and imaging studies  CBC:  Recent Labs Lab 09/15/17 1801 09/16/17 0509 09/17/17 0500  WBC 21.3* 19.6* 12.2*  NEUTROABS  --  17.4* 10.0*  HGB 9.3* 8.2* 8.3*  HCT 29.3* 26.3* 27.1*  MCV 73.6* 73.5* 74.2*  PLT 602* 523* 606*   Basic Metabolic Panel:  Recent Labs Lab 09/15/17 1801 09/16/17 0509 09/17/17 0500  NA 131* 132* 137  K 3.8 3.5 3.9  CL 96* 98* 106  CO2 23 25 25   GLUCOSE 115* 103* 87  BUN 11 11 8   CREATININE 1.30* 1.02 0.86  CALCIUM 8.5* 7.8* 7.9*  MG  --   --  1.8   GFR: Estimated Creatinine Clearance: 88.6 mL/min (by C-G formula based on SCr of 0.86 mg/dL). Liver Function Tests:  Recent Labs Lab 09/15/17 1801 09/17/17 0500  AST 18 19  ALT 13* 10*  ALKPHOS 86 61   BILITOT 0.3 0.6  PROT 7.3 5.7*  ALBUMIN 2.5* 1.9*   No results for input(s): LIPASE, AMYLASE in the last 168 hours. No results for input(s): AMMONIA in the last 168 hours. Coagulation Profile:  Recent Labs Lab 09/16/17 0736  INR 1.18   Cardiac Enzymes: No results for input(s): CKTOTAL, CKMB, CKMBINDEX, TROPONINI in the last 168 hours. BNP (last 3 results) No results for input(s): PROBNP in the last 8760 hours. HbA1C: No results for input(s): HGBA1C in the last 72 hours. CBG: No results for input(s): GLUCAP in the last 168 hours. Lipid Profile: No results for input(s): CHOL, HDL, LDLCALC, TRIG, CHOLHDL, LDLDIRECT in the last 72 hours. Thyroid Function Tests: No results for input(s): TSH, T4TOTAL, FREET4, T3FREE, THYROIDAB in the last 72 hours. Anemia Panel: No results for input(s): VITAMINB12, FOLATE, FERRITIN, TIBC, IRON, RETICCTPCT in the last 72 hours. Sepsis Labs:  Recent Labs Lab 09/15/17 2352  LATICACIDVEN 1.05    Recent Results (from the past 240 hour(s))  Blood culture (routine x 2)     Status: None (Preliminary result)   Collection Time: 09/15/17 11:40 PM  Result Value Ref Range Status   Specimen Description BLOOD RIGHT HAND  Final   Special Requests IN PEDIATRIC BOTTLE Blood Culture adequate volume  Final   Culture NO GROWTH 1 DAY  Final   Report Status PENDING  Incomplete  Blood culture (routine x 2)     Status: None (Preliminary result)   Collection Time: 09/15/17 11:45 PM  Result Value Ref Range Status   Specimen Description BLOOD LEFT ANTECUBITAL  Final   Special Requests   Final    BOTTLES DRAWN AEROBIC AND ANAEROBIC Blood Culture adequate volume   Culture NO GROWTH 1 DAY  Final   Report Status PENDING  Incomplete  Aerobic/Anaerobic Culture (surgical/deep wound)     Status: None (Preliminary result)   Collection Time: 09/16/17  5:16 PM  Result Value Ref Range Status   Specimen Description ABSCESS  Final   Special Requests COLONIC DIVERTICULAR   Final   Gram Stain   Final    ABUNDANT WBC PRESENT, PREDOMINANTLY PMN ABUNDANT GRAM NEGATIVE RODS ABUNDANT GRAM POSITIVE RODS FEW GRAM POSITIVE COCCI IN PAIRS IN CLUSTERS    Culture PENDING  Incomplete   Report Status PENDING  Incomplete         Radiology Studies: Ct Abdomen Pelvis W Contrast  Result Date: 09/15/2017 CLINICAL DATA:  Left-sided abdominal pain. Diverticulitis suspected. EXAM: CT ABDOMEN AND PELVIS WITH CONTRAST TECHNIQUE: Multidetector CT imaging of the abdomen and pelvis was performed using the standard protocol following bolus administration of intravenous contrast. CONTRAST:  175mL ISOVUE-300 IOPAMIDOL (ISOVUE-300) INJECTION 61% COMPARISON:  None. FINDINGS: Lower chest: The lung bases are clear. Small hiatal hernia. Pericardial calcification adjacent to  the right atrium. Hepatobiliary: Gallbladder physiologically distended, no calcified stone. No biliary dilatation. Pancreas: No ductal dilatation or inflammation. Spleen: Normal in size without focal abnormality. Adrenals/Urinary Tract: No adrenal nodule. No hydronephrosis. Lobular renal contours with symmetric perinephric edema. Only minimal excretion on delayed phase imaging. Urinary bladder is physiologically distended. There is a right posterior bladder diverticulum currently measuring 5 cm. Stomach/Bowel: Pericolonic inflammation adjacent to the distal descending colon. There is a large irregular extraluminal collection containing air, small amount of fluid and probable stool with surrounding soft tissue stranding suspicious for pericolonic abscess. The borders are ill-defined, however measures approximately 5.7 x 6.1 x 5.9 cm. Communication with this collection in the descending colon is suspected, best demonstrated on coronal reformat image 60. There is associated colonic wall thickening proximal and distal to the extra colonic collection. No significant diverticular disease is seen elsewhere. Moderate stool in the more  proximal colon with liquid stool in the cecum and ascending colon. Normal appendix tentatively identified. There is no small bowel dilatation or inflammation. Small hiatal hernia, stomach is decompressed. Vascular/Lymphatic: No definite pericolonic adenopathy, degree of inflammation partial limits assessment. Normal caliber abdominal aorta with mild atherosclerosis. No pelvic adenopathy. Reproductive: Normal sized prostate gland. Other: Inflammatory change in the left lower abdomen is soft tissue stranding and trace fluid, no ascites elsewhere. There is no tracking free air. Musculoskeletal: There are no acute or suspicious osseous abnormalities. Multilevel degenerative change in the lumbar spine, degenerative change in both hips. IMPRESSION: 1. Large inflammatory pericolonic ill-defined heterogeneous collection arising from the distal descending colon containing air, fluid and possible stool. Colonic wall thickening just proximal distal. Differential considerations include diverticulitis with abscess versus perforated colonic malignancy. No significant diverticular disease is seen elsewhere in the colon, which increases concern for malignancy. 2. Perinephric edema and diminished renal excretion on delayed phase imaging suggesting underlying renal disease. 3. Hepatic granuloma.  No focal hepatic mass. 4.  Aortic Atherosclerosis (ICD10-I70.0). These results were called by telephone at the time of interpretation on 09/15/2017 at 11:25 pm to Dr. Thomasene Lot , who verbally acknowledged these results. Electronically Signed   By: Jeb Levering M.D.   On: 09/15/2017 23:26   Ct Image Guided Drainage By Percutaneous Catheter  Result Date: 09/16/2017 INDICATION: 82 year old male with a history of diverticular abscess EXAM: CT GUIDED DRAINAGE OF ABDOMINAL ABSCESS MEDICATIONS: The patient is currently admitted to the hospital and receiving intravenous antibiotics. The antibiotics were administered within an appropriate time  frame prior to the initiation of the procedure. ANESTHESIA/SEDATION: 1.0 mg IV Versed 50 mcg IV Fentanyl Moderate Sedation Time:  10 minutes The patient was continuously monitored during the procedure by the interventional radiology nurse under my direct supervision. COMPLICATIONS: None TECHNIQUE: Informed written consent was obtained from the patient after a thorough discussion of the procedural risks, benefits and alternatives. All questions were addressed. Maximal Sterile Barrier Technique was utilized including caps, mask, sterile gowns, sterile gloves, sterile drape, hand hygiene and skin antiseptic. A timeout was performed prior to the initiation of the procedure. PROCEDURE: The left lower abdomen was prepped with chlorhexidine in a sterile fashion, and a sterile drape was applied covering the operative field. A sterile gown and sterile gloves were used for the procedure. Local anesthesia was provided with 1% Lidocaine. After a scout CT, the patient is prepped and draped in the usual sterile fashion. 1% lidocaine was used for local anesthesia. A small stab incision was made, and using CT guidance, trocar needle was advanced into the fluid  and gas collection in the left abdomen. Once we confirmed needle tip position, modified Seldinger technique was used to place a 12 [place] drain into the collection. Frankly purulent material was aspirated. Pigtail drain was sutured in position and attached to gravity drainage. Approximately 50 cc of fluid removed. Sample was sent to the lab for analysis. Patient tolerated the procedure well and remained hemodynamically stable throughout. No complications were encountered and no significant blood loss. FINDINGS: Fluid and gas collection the left lower quadrant adjacent to the sigmoid colon similar to the comparison CT. Final image demonstrates placement of pigtail catheter into the collection. Approximately 50 cc of frankly purulent material aspirated. IMPRESSION: Status post  CT-guided drainage of left lower quadrant diverticular abscess. Signed, Dulcy Fanny. Earleen Newport, DO [facility] Electronically Signed   By: Corrie Mckusick D.O.   On: 09/16/2017 17:27        Scheduled Meds: . docusate sodium  200 mg Oral BID  . Influenza vac split quadrivalent PF  0.5 mL Intramuscular Tomorrow-1000  . pantoprazole (PROTONIX) IV  40 mg Intravenous Q12H  . sodium chloride flush  5 mL Intravenous Q8H   Continuous Infusions: . sodium chloride 75 mL/hr at 09/17/17 0446  . piperacillin-tazobactam (ZOSYN)  IV Stopped (09/17/17 0840)     LOS: 1 day        Aline August, MD [facility] Pager [phone]  If 7PM-7AM, please contact night-coverage [email or web address] Password [email or web address] 09/17/2017, 1:56 PM

## 2017-09-17 NOTE — Evaluation (Signed)
Physical Therapy Evaluation Patient Details Name: Roberto Owen MRN: [id number] DOB: [birth year] Today's Date: 09/17/2017   History of Present Illness  Pt is a 82 y.o. male with PMH significant for microcytic anemia and peptic ulcer disease, now presented to ED on 09/15/17 for evaluation of 5 days of abdominal pain with nausea and loss of appetite; determined to have diverticulitis with contained perforation and abscess and started on broad-spectrum antibiotics. S/p image guided percutaneous drainage of L lower quadrant abscess on 9/27.     Clinical Impression  Patient evaluated by Physical Therapy with no further acute PT needs identified. PTA, pt mod indep with intermittent use of SPC, is very active and walks regularly for exercise; currently visiting family in the area, and plans to d/c home with them. Mod indep with SPC for amb 600' and ascend/descending steps with rail. All education has been completed and the patient/family has no further questions. PT is signing off. Thank you for this referral.   Follow Up Recommendations No PT follow up    Equipment Recommendations  None recommended by PT    Recommendations for Other Services       Precautions / Restrictions Precautions Precautions: None Restrictions Weight Bearing Restrictions: No      Mobility  Bed Mobility Overal bed mobility: Independent             General bed mobility comments: Received sitting in chair. Pt and family report indep with bed mob  Transfers Overall transfer level: Modified independent Equipment used: Straight cane;None                Ambulation/Gait Ambulation/Gait assistance: Modified independent (Device/Increase time) Ambulation Distance (Feet): 600 Feet Assistive device: Straight cane Gait Pattern/deviations: Step-through pattern;Decreased stride length;Trunk flexed Gait velocity: Decreased Gait velocity interpretation: <1.8 ft/sec, indicative of risk for recurrent falls General  Gait Details: Mod indep with SPC, switching from R to L hand intermittently. 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   Stairs Stairs: Yes Stairs assistance: Modified independent (Device/Increase time) Stair Management: One rail Left;Alternating pattern;Forwards Number of Stairs: 5 General stair comments: Educ on technique for safety  Wheelchair Mobility    Modified Rankin (Stroke Patients Only)       Balance Overall balance assessment: Needs assistance Sitting-balance support: No upper extremity supported;Feet supported Sitting balance-Leahy Scale: Good     Standing balance support: No upper extremity supported;Single extremity supported;During functional activity Standing balance-Leahy Scale: Good               High level balance activites: Side stepping;Backward walking;Direction changes;Turns;Sudden stops               Pertinent Vitals/Pain Pain Assessment: Faces Pain Score: 0-No pain    Home Living Family/patient expects to be discharged to:: Private residence Living Arrangements: Children Available Help at Discharge: Family;Available 24 hours/day Type of Home: House Home Access: Stairs to enter Entrance Stairs-Rails: Can reach both Entrance Stairs-Number of Steps: 5 Home Layout: One level Home Equipment: Cane - single point;Walker - 2 wheels Additional Comments: Pt from [place], but currently visiting family in the area. Plans to d/c home with family    Prior Function Level of Independence: Independent with assistive device(s)         Comments: Mod indep with intermittent use of SPC     Hand Dominance  Extremity/Trunk Assessment   Upper Extremity Assessment Upper Extremity Assessment: Overall WFL for tasks assessed    Lower Extremity Assessment Lower Extremity Assessment: Overall  WFL for tasks assessed    Cervical / Trunk Assessment Cervical / Trunk Assessment: Kyphotic  Communication   Communication: No difficulties  Cognition Arousal/Alertness: Awake/alert Behavior During Therapy: WFL for tasks assessed/performed Overall Cognitive Status: Within Functional Limits for tasks assessed                                        General Comments General comments (skin integrity, edema, etc.): Family present during session and very supportive    Exercises     Assessment/Plan    PT Assessment Patent does not need any further PT services  PT Problem List         PT Treatment Interventions      PT Goals (Current goals can be found in the Care Plan section)  Acute Rehab PT Goals Patient Stated Goal: Return home PT Goal Formulation: With patient Time For Goal Achievement: 10/01/17 Potential to Achieve Goals: Good    Frequency     Barriers to discharge        Co-evaluation               AM-PAC PT "6 Clicks" Daily Activity  Outcome Measure Difficulty turning over in bed (including adjusting bedclothes, sheets and blankets)?: None Difficulty moving from lying on back to sitting on the side of the bed? : None Difficulty sitting down on and standing up from a chair with arms (e.g., wheelchair, bedside commode, etc,.)?: None Help needed moving to and from a bed to chair (including a wheelchair)?: None Help needed walking in hospital room?: None Help needed climbing 3-5 steps with a railing? : None 6 Click Score: 24    End of Session Equipment Utilized During Treatment: Gait belt Activity Tolerance: Patient tolerated treatment well Patient left: in chair;with call bell/phone within reach;with family/visitor present Nurse Communication: Mobility status PT Visit Diagnosis: Other abnormalities of gait and mobility (R26.89)    Time: 4401-0272 PT Time Calculation (min) (ACUTE ONLY): 25 min   Charges:   PT Evaluation $PT Eval Low  Complexity: 1 Low PT Treatments $Gait Training: 8-22 mins   PT G Codes:       Mabeline Caras, PT, DPT Acute Rehab Services  Pager: [place] 09/17/2017, 1:32 PM

## 2017-09-17 NOTE — Telephone Encounter (Signed)
Patient presented to Urgent Care, sent to the ED and subsequently admitted

## 2017-09-17 NOTE — Progress Notes (Signed)
Subjective: Alert.  Stable.  Says he feels better and pain is less.  Wants to eat. Underwent image guided percutaneous drainage of his left lower quadrant abscess yesterday. Purulent fluid returned here he has drained 75 mL overnight. WBC down to 12,200.  Hemoglobin 8.3, stable. Cmet  looks good.  Albumin 1.9, however.  Objective: Vital signs in last 24 hours: Temp:  [98 F (36.7 C)-99.8 F (37.7 C)] 98.2 F (36.8 C) (09/28 0440) Pulse Rate:  [66-113] 66 (09/28 0440) Resp:  [16-24] 18 (09/28 0100) BP: (106-122)/(47-63) 120/63 (09/28 0440) SpO2:  [91 %-100 %] 100 % (09/28 0440) Last BM Date: 09/15/17  Intake/Output from previous day: 09/27 0701 - 09/28 0700 In: 1898.8 [P.O.:30; I.V.:1708.8; IV Piggyback:150] Out: 790 [Urine:800; Drains:75] Intake/Output this shift: Total I/O In: 998.8 [P.O.:30; I.V.:858.8; Other:10; IV Piggyback:100] Out: 625 [Urine:550; Drains:75]  General appearance: alert.  Talkative.  Friendly.  No distress. Resp: clear to auscultation bilaterally GI: soft.  Nondistended.  Minimal tenderness around left lower quadradrain.  A little drainage.  75 mL overnight no blood.  Does not look enteric Extremities: no edema, redness or tenderness in the calves or thighs  Lab Results:   Recent Labs  09/16/17 0509 09/17/17 0500  WBC 19.6* 12.2*  HGB 8.2* 8.3*  HCT 26.3* 27.1*  PLT 523* 468*   BMET  Recent Labs  09/16/17 0509 09/17/17 0500  NA 132* 137  K 3.5 3.9  CL 98* 106  CO2 25 25  GLUCOSE 103* 87  BUN 11 8  CREATININE 1.02 0.86  CALCIUM 7.8* 7.9*   PT/INR  Recent Labs  09/16/17 0736  LABPROT 14.9  INR 1.18   ABG No results for input(s): PHART, HCO3 in the last 72 hours.  Invalid input(s): PCO2, PO2  Studies/Results: Ct Abdomen Pelvis W Contrast  Result Date: 09/15/2017 CLINICAL DATA:  Left-sided abdominal pain. Diverticulitis suspected. EXAM: CT ABDOMEN AND PELVIS WITH CONTRAST TECHNIQUE: Multidetector CT imaging of the abdomen  and pelvis was performed using the standard protocol following bolus administration of intravenous contrast. CONTRAST:  167mL ISOVUE-300 IOPAMIDOL (ISOVUE-300) INJECTION 61% COMPARISON:  None. FINDINGS: Lower chest: The lung bases are clear. Small hiatal hernia. Pericardial calcification adjacent to the right atrium. Hepatobiliary: Gallbladder physiologically distended, no calcified stone. No biliary dilatation. Pancreas: No ductal dilatation or inflammation. Spleen: Normal in size without focal abnormality. Adrenals/Urinary Tract: No adrenal nodule. No hydronephrosis. Lobular renal contours with symmetric perinephric edema. Only minimal excretion on delayed phase imaging. Urinary bladder is physiologically distended. There is a right posterior bladder diverticulum currently measuring 5 cm. Stomach/Bowel: Pericolonic inflammation adjacent to the distal descending colon. There is a large irregular extraluminal collection containing air, small amount of fluid and probable stool with surrounding soft tissue stranding suspicious for pericolonic abscess. The borders are ill-defined, however measures approximately 5.7 x 6.1 x 5.9 cm. Communication with this collection in the descending colon is suspected, best demonstrated on coronal reformat image 60. There is associated colonic wall thickening proximal and distal to the extra colonic collection. No significant diverticular disease is seen elsewhere. Moderate stool in the more proximal colon with liquid stool in the cecum and ascending colon. Normal appendix tentatively identified. There is no small bowel dilatation or inflammation. Small hiatal hernia, stomach is decompressed. Vascular/Lymphatic: No definite pericolonic adenopathy, degree of inflammation partial limits assessment. Normal caliber abdominal aorta with mild atherosclerosis. No pelvic adenopathy. Reproductive: Normal sized prostate gland. Other: Inflammatory change in the left lower abdomen is soft tissue  stranding and trace  fluid, no ascites elsewhere. There is no tracking free air. Musculoskeletal: There are no acute or suspicious osseous abnormalities. Multilevel degenerative change in the lumbar spine, degenerative change in both hips. IMPRESSION: 1. Large inflammatory pericolonic ill-defined heterogeneous collection arising from the distal descending colon containing air, fluid and possible stool. Colonic wall thickening just proximal distal. Differential considerations include diverticulitis with abscess versus perforated colonic malignancy. No significant diverticular disease is seen elsewhere in the colon, which increases concern for malignancy. 2. Perinephric edema and diminished renal excretion on delayed phase imaging suggesting underlying renal disease. 3. Hepatic granuloma.  No focal hepatic mass. 4.  Aortic Atherosclerosis (ICD10-I70.0). These results were called by telephone at the time of interpretation on 09/15/2017 at 11:25 pm to Dr. Thomasene Lot , who verbally acknowledged these results. Electronically Signed   By: Jeb Levering M.D.   On: 09/15/2017 23:26   Ct Image Guided Drainage By Percutaneous Catheter  Result Date: 09/16/2017 INDICATION: 82 year old male with a history of diverticular abscess EXAM: CT GUIDED DRAINAGE OF ABDOMINAL ABSCESS MEDICATIONS: The patient is currently admitted to the hospital and receiving intravenous antibiotics. The antibiotics were administered within an appropriate time frame prior to the initiation of the procedure. ANESTHESIA/SEDATION: 1.0 mg IV Versed 50 mcg IV Fentanyl Moderate Sedation Time:  10 minutes The patient was continuously monitored during the procedure by the interventional radiology nurse under my direct supervision. COMPLICATIONS: None TECHNIQUE: Informed written consent was obtained from the patient after a thorough discussion of the procedural risks, benefits and alternatives. All questions were addressed. Maximal Sterile Barrier Technique was  utilized including caps, mask, sterile gowns, sterile gloves, sterile drape, hand hygiene and skin antiseptic. A timeout was performed prior to the initiation of the procedure. PROCEDURE: The left lower abdomen was prepped with chlorhexidine in a sterile fashion, and a sterile drape was applied covering the operative field. A sterile gown and sterile gloves were used for the procedure. Local anesthesia was provided with 1% Lidocaine. After a scout CT, the patient is prepped and draped in the usual sterile fashion. 1% lidocaine was used for local anesthesia. A small stab incision was made, and using CT guidance, trocar needle was advanced into the fluid and gas collection in the left abdomen. Once we confirmed needle tip position, modified Seldinger technique was used to place a 12 [place] drain into the collection. Frankly purulent material was aspirated. Pigtail drain was sutured in position and attached to gravity drainage. Approximately 50 cc of fluid removed. Sample was sent to the lab for analysis. Patient tolerated the procedure well and remained hemodynamically stable throughout. No complications were encountered and no significant blood loss. FINDINGS: Fluid and gas collection the left lower quadrant adjacent to the sigmoid colon similar to the comparison CT. Final image demonstrates placement of pigtail catheter into the collection. Approximately 50 cc of frankly purulent material aspirated. IMPRESSION: Status post CT-guided drainage of left lower quadrant diverticular abscess. Signed, Dulcy Fanny. Earleen Newport, DO [facility] Electronically Signed   By: Corrie Mckusick D.O.   On: 09/16/2017 17:27    Anti-infectives: Anti-infectives    Start     Dose/Rate Route Frequency Ordered Stop   09/16/17 0600  piperacillin-tazobactam (ZOSYN) IVPB 3.375 g     3.375 g 12.5 mL/hr over 240 Minutes Intravenous Every 8 hours 09/16/17 0215     09/15/17 2345   ciprofloxacin (CIPRO) IVPB 400 mg     400 mg 200 mL/hr over 60 Minutes Intravenous  Once 09/15/17  2336 09/16/17 0150   09/15/17 2345  metroNIDAZOLE (FLAGYL) IVPB 500 mg     500 mg 100 mL/hr over 60 Minutes Intravenous  Once 09/15/17 2336 09/16/17 0150      Assessment/Plan:   Acute diverticulitis with abscess.  Less likely perforated cancer    IR drainage successful 9/27    Continue broad-spectrum antibiotics     He is aware that if he does not respond percutaneous drainage he may need colectomy and colostomy, hopefully he will respond allowing elective outpatient workup including colonoscopy  Recent EGD Recent blood transfusion for anemia Normal colonoscopy 1 year ago in [place] by history. Anemia-suspect GI blood loss.    LOS: 1 day    Rogers Ditter M 09/17/2017

## 2017-09-18 DIAGNOSIS — K279 Peptic ulcer, site unspecified, unspecified as acute or chronic, without hemorrhage or perforation: Secondary | ICD-10-CM

## 2017-09-18 LAB — BASIC METABOLIC PANEL
Anion gap: 5 (ref 5–15)
BUN: 6 mg/dL (ref 6–20)
CALCIUM: 7.6 mg/dL — AB (ref 8.9–10.3)
CO2: 27 mmol/L (ref 22–32)
CREATININE: 0.78 mg/dL (ref 0.61–1.24)
Chloride: 104 mmol/L (ref 101–111)
GFR calc Af Amer: >60 mL/min (ref >60)
GLUCOSE: 96 mg/dL (ref 65–99)
POTASSIUM: 3.6 mmol/L (ref 3.5–5.1)
SODIUM: 136 mmol/L (ref 135–145)

## 2017-09-18 LAB — CBC
HCT: 26 % — ABNORMAL LOW (ref 39.0–52.0)
Hemoglobin: 8.1 g/dL — ABNORMAL LOW (ref 13.0–17.0)
MCH: 23 pg — AB (ref 26.0–34.0)
MCHC: 31.2 g/dL (ref 30.0–36.0)
MCV: 73.9 fL — AB (ref 78.0–100.0)
PLATELETS: 538 10*3/uL — AB (ref 150–400)
RBC: 3.52 MIL/uL — AB (ref 4.22–5.81)
RDW: 20 % — AB (ref 11.5–15.5)
WBC: 6.2 10*3/uL (ref 4.0–10.5)

## 2017-09-18 LAB — AEROBIC/ANAEROBIC CULTURE W GRAM STAIN (SURGICAL/DEEP WOUND)

## 2017-09-18 LAB — MAGNESIUM: MAGNESIUM: 1.8 mg/dL (ref 1.7–2.4)

## 2017-09-18 LAB — AEROBIC/ANAEROBIC CULTURE (SURGICAL/DEEP WOUND)

## 2017-09-18 MED ORDER — PANTOPRAZOLE SODIUM 40 MG PO TBEC
40.0000 mg | DELAYED_RELEASE_TABLET | Freq: Two times a day (BID) | ORAL | Status: DC
  Administered 2017-09-18 – 2017-09-21 (×7): 40 mg via ORAL
  Filled 2017-09-18 (×6): qty 1

## 2017-09-18 NOTE — Progress Notes (Signed)
Patient ID: Roberto Owen, male   DOB: 1935-10-11, 82 y.o.   MRN: [id number]  PROGRESS NOTE    Charli Liberatore  JOA:[id number] DOB: [birth year] DOA: 09/15/2017 PCP: System, Pcp Not In   Brief Narrative:  82 year old male with history of microcytic anemia and peptic ulcer disease presented on 09/15/2017 with abdominal pain and was found to have diverticulitis with probable abscess. IV antibiotics were started and general surgery was consulted. Patient underwent IR guided drain placement on 09/16/2017.   Assessment & Plan:   Principal Problem:   Diverticulitis of large intestine with abscess Active Problems:   Microcytic anemia   Anemia due to GI blood loss   Hyponatremia   PUD (peptic ulcer disease)   AKI (acute kidney injury) ([place])   Diverticulitis of intestine with abscess   Orthostasis   Diverticular disease of intestine with perforation and abscess   1. Acute diverticulitis with abscess with probable contained perforation - General surgery following. Status post IR guided drain placement on 09/16/2017. culture still pending  - Continue Zosyn - Patient might need outpatient GI evaluation for probable colonoscopy after completion of antibiotic treatment  - Diet advancement as per general surgery  - abdominal pain has much improved  2. Acute kidney injury  - Resolved   3. PUD  - Pt admitted last month with microcytic anemia and heme-positive stools  - EGD revealed non-bleeding ulcers in esophagus and stomach  -Continue Protonix twice a day  4. Microcytic anemia  - Hemoglobin stable. Outpatient follow-up with GI  5. Hyponatremia - Probably from hypovolemia. Resolved    6. Leukocytosis - resolved.   7. Thrombocytosis - Probably reactive. Repeat a.m. labs   DVT prophylaxis: SCDs Code Status:  Full Family Communication: None at bedside Disposition Plan: Home in 2-3 days once cleared by surgery  Consultants: Gen. surgery and IR  Procedures: IR guided  percutaneous drainage on 09/16/2017  Antimicrobials: Zosyn from 09/15/2017    Subjective: Patient seen and examined at bedside. He feels much better. His abdominal pain is improving. No overnight fever or vomiting. He is tolerating clear liquid diet  Objective: Vitals:   09/18/17 0200 09/18/17 0500 09/18/17 0553 09/18/17 0950  BP: (!) 119/54  (!) 153/82 139/68  Pulse: 72  85 82  Resp: 18  20 20   Temp: 98.5 F (36.9 C)  98 F (36.7 C) 97.6 F (36.4 C)  TempSrc: Oral  Oral Oral  SpO2: 99%  100% 100%  Weight:  91.5 kg (201 lb 11.5 oz)    Height:        Intake/Output Summary (Last 24 hours) at 09/18/17 1034 Last data filed at 09/18/17 0948  Gross per 24 hour  Intake             1010 ml  Output              825 ml  Net              185 ml   Filed Weights   09/15/17 1735 09/16/17 0315 09/18/17 0500  Weight: 95.3 kg (210 lb) 91.8 kg (202 lb 4.8 oz) 91.5 kg (201 lb 11.5 oz)    Examination:  General exam: Appears calm and comfortable  Respiratory system: Bilateral decreased breath sound at bases Cardiovascular system: S1 & S2 heard, Rate controlled.  Gastrointestinal system: Abdomen is nondistended, soft and nontender with left-sided dressing and left lower quadrant drain present Extremities: No cyanosis, clubbing, edema    Data Reviewed: I have personally reviewed following  labs and imaging studies  CBC:  Recent Labs Lab 09/15/17 1801 09/16/17 0509 09/17/17 0500 09/18/17 0422  WBC 21.3* 19.6* 12.2* 6.2  NEUTROABS  --  17.4* 10.0*  --   HGB 9.3* 8.2* 8.3* 8.1*  HCT 29.3* 26.3* 27.1* 26.0*  MCV 73.6* 73.5* 74.2* 73.9*  PLT 602* 523* 468* 448*   Basic Metabolic Panel:  Recent Labs Lab 09/15/17 1801 09/16/17 0509 09/17/17 0500 09/18/17 0422  NA 131* 132* 137 136  K 3.8 3.5 3.9 3.6  CL 96* 98* 106 104  CO2 23 25 25 27   GLUCOSE 115* 103* 87 96  BUN 11 11 8 6   CREATININE 1.30* 1.02 0.86 0.78  CALCIUM 8.5* 7.8* 7.9* 7.6*  MG  --   --  1.8 1.8    GFR: Estimated Creatinine Clearance: 95.2 mL/min (by C-G formula based on SCr of 0.78 mg/dL). Liver Function Tests:  Recent Labs Lab 09/15/17 1801 09/17/17 0500  AST 18 19  ALT 13* 10*  ALKPHOS 86 61  BILITOT 0.3 0.6  PROT 7.3 5.7*  ALBUMIN 2.5* 1.9*   No results for input(s): LIPASE, AMYLASE in the last 168 hours. No results for input(s): AMMONIA in the last 168 hours. Coagulation Profile:  Recent Labs Lab 09/16/17 0736  INR 1.18   Cardiac Enzymes: No results for input(s): CKTOTAL, CKMB, CKMBINDEX, TROPONINI in the last 168 hours. BNP (last 3 results) No results for input(s): PROBNP in the last 8760 hours. HbA1C: No results for input(s): HGBA1C in the last 72 hours. CBG: No results for input(s): GLUCAP in the last 168 hours. Lipid Profile: No results for input(s): CHOL, HDL, LDLCALC, TRIG, CHOLHDL, LDLDIRECT in the last 72 hours. Thyroid Function Tests: No results for input(s): TSH, T4TOTAL, FREET4, T3FREE, THYROIDAB in the last 72 hours. Anemia Panel: No results for input(s): VITAMINB12, FOLATE, FERRITIN, TIBC, IRON, RETICCTPCT in the last 72 hours. Sepsis Labs:  Recent Labs Lab 09/15/17 2352  LATICACIDVEN 1.05    Recent Results (from the past 240 hour(s))  Blood culture (routine x 2)     Status: None (Preliminary result)   Collection Time: 09/15/17 11:40 PM  Result Value Ref Range Status   Specimen Description BLOOD RIGHT HAND  Final   Special Requests IN PEDIATRIC BOTTLE Blood Culture adequate volume  Final   Culture NO GROWTH 1 DAY  Final   Report Status PENDING  Incomplete  Blood culture (routine x 2)     Status: None (Preliminary result)   Collection Time: 09/15/17 11:45 PM  Result Value Ref Range Status   Specimen Description BLOOD LEFT ANTECUBITAL  Final   Special Requests   Final    BOTTLES DRAWN AEROBIC AND ANAEROBIC Blood Culture adequate volume   Culture NO GROWTH 1 DAY  Final   Report Status PENDING  Incomplete  Aerobic/Anaerobic Culture  (surgical/deep wound)     Status: None (Preliminary result)   Collection Time: 09/16/17  5:16 PM  Result Value Ref Range Status   Specimen Description ABSCESS  Final   Special Requests COLONIC DIVERTICULAR  Final   Gram Stain   Final    ABUNDANT WBC PRESENT, PREDOMINANTLY PMN ABUNDANT GRAM NEGATIVE RODS ABUNDANT GRAM POSITIVE RODS FEW GRAM POSITIVE COCCI IN PAIRS IN CLUSTERS    Culture CULTURE REINCUBATED FOR BETTER GROWTH  Final   Report Status PENDING  Incomplete         Radiology Studies: Ct Image Guided Drainage By Percutaneous Catheter  Result Date: 09/16/2017 INDICATION: 82 year old male with a history  of diverticular abscess EXAM: CT GUIDED DRAINAGE OF ABDOMINAL ABSCESS MEDICATIONS: The patient is currently admitted to the hospital and receiving intravenous antibiotics. The antibiotics were administered within an appropriate time frame prior to the initiation of the procedure. ANESTHESIA/SEDATION: 1.0 mg IV Versed 50 mcg IV Fentanyl Moderate Sedation Time:  10 minutes The patient was continuously monitored during the procedure by the interventional radiology nurse under my direct supervision. COMPLICATIONS: None TECHNIQUE: Informed written consent was obtained from the patient after a thorough discussion of the procedural risks, benefits and alternatives. All questions were addressed. Maximal Sterile Barrier Technique was utilized including caps, mask, sterile gowns, sterile gloves, sterile drape, hand hygiene and skin antiseptic. A timeout was performed prior to the initiation of the procedure. PROCEDURE: The left lower abdomen was prepped with chlorhexidine in a sterile fashion, and a sterile drape was applied covering the operative field. A sterile gown and sterile gloves were used for the procedure. Local anesthesia was provided with 1% Lidocaine. After a scout CT, the patient is prepped and draped in the usual sterile fashion. 1% lidocaine was used for local anesthesia. A small stab  incision was made, and using CT guidance, trocar needle was advanced into the fluid and gas collection in the left abdomen. Once we confirmed needle tip position, modified Seldinger technique was used to place a 12 [place] drain into the collection. Frankly purulent material was aspirated. Pigtail drain was sutured in position and attached to gravity drainage. Approximately 50 cc of fluid removed. Sample was sent to the lab for analysis. Patient tolerated the procedure well and remained hemodynamically stable throughout. No complications were encountered and no significant blood loss. FINDINGS: Fluid and gas collection the left lower quadrant adjacent to the sigmoid colon similar to the comparison CT. Final image demonstrates placement of pigtail catheter into the collection. Approximately 50 cc of frankly purulent material aspirated. IMPRESSION: Status post CT-guided drainage of left lower quadrant diverticular abscess. Signed, Dulcy Fanny. Earleen Newport, DO [facility] Rad[facility] Electronically Signed   By: Corrie Mckusick D.O.   On: 09/16/2017 17:27        Scheduled Meds: . docusate sodium  200 mg Oral BID  . pantoprazole (PROTONIX) IV  40 mg Intravenous Q12H  . sodium chloride flush  5 mL Intravenous Q8H   Continuous Infusions: . sodium chloride 50 mL/hr at 09/17/17 1447  . piperacillin-tazobactam (ZOSYN)  IV Stopped (09/18/17 0900)     LOS: 2 days        Aline August, MD [facility] Pager [phone]  If 7PM-7AM, please contact night-coverage [email or web address] Password [facility] 09/18/2017, 10:34 AM

## 2017-09-18 NOTE — Progress Notes (Signed)
    IO:[id number] pain  Subjective: Making good progress, tolerating clears well. Drainage is still serous and slightly cloudy.  Objective: Vital signs in last 24 hours: Temp:  [98 F (36.7 C)-98.8 F (37.1 C)] 98 F (36.7 C) (09/29 0553) Pulse Rate:  [72-94] 85 (09/29 0553) Resp:  [18-20] 20 (09/29 0553) BP: (119-153)/(54-82) 153/82 (09/29 0553) SpO2:  [99 %-100 %] 100 % (09/29 0553) Weight:  [91.5 kg (201 lb 11.5 oz)] 91.5 kg (201 lb 11.5 oz) (09/29 0500) Last BM Date: 09/15/17 520PO Urine 700 Drain 100 Afebrile vital signs are stable BMP okay;  WBC 6.2   H/H:  8.1/26 CT 926/IR drain 9/27  Intake/Output from previous day: 09/28 0701 - 09/29 0700 In: 1130 [P.O.:520; I.V.:550; IV Piggyback:50] Out: 800 [Urine:700; Drains:100] Intake/Output this shift: No intake/output data recorded.  General appearance: alert, cooperative and no distress Resp: clear to auscultation bilaterally GI: soft, much less tender, drainage isa still somewhat cloudy serous fluid  Lab Results:   Recent Labs  09/17/17 0500 09/18/17 0422  WBC 12.2* 6.2  HGB 8.3* 8.1*  HCT 27.1* 26.0*  PLT 468* 538*    BMET  Recent Labs  09/17/17 0500 09/18/17 0422  NA 137 136  K 3.9 3.6  CL 106 104  CO2 25 27  GLUCOSE 87 96  BUN 8 6  CREATININE 0.86 0.78  CALCIUM 7.9* 7.6*   PT/INR  Recent Labs  09/16/17 0736  LABPROT 14.9  INR 1.18     Recent Labs Lab 09/15/17 1801 09/17/17 0500  AST 18 19  ALT 13* 10*  ALKPHOS 86 61  BILITOT 0.3 0.6  PROT 7.3 5.7*  ALBUMIN 2.5* 1.9*     Lipase     Component Value Date/Time   LIPASE 22 07/23/2017 1356     Medications: . docusate sodium  200 mg Oral BID  . Influenza vac split quadrivalent PF  0.5 mL Intramuscular Tomorrow-1000  . pantoprazole (PROTONIX) IV  40 mg Intravenous Q12H  . sodium chloride flush  5 mL Intravenous Q8H   . sodium chloride 50 mL/hr at 09/17/17 1447  . piperacillin-tazobactam (ZOSYN)  IV Stopped (09/18/17  0900)   Anti-infectives    Start     Dose/Rate Route Frequency Ordered Stop   09/16/17 0600  piperacillin-tazobactam (ZOSYN) IVPB 3.375 g     3.375 g 12.5 mL/hr over 240 Minutes Intravenous Every 8 hours 09/16/17 0215     09/15/17 2345  ciprofloxacin (CIPRO) IVPB 400 mg     400 mg 200 mL/hr over 60 Minutes Intravenous  Once 09/15/17 2336 09/16/17 0150   09/15/17 2345  metroNIDAZOLE (FLAGYL) IVPB 500 mg     500 mg 100 mL/hr over 60 Minutes Intravenous  Once 09/15/17 2336 09/16/17 0150      Assessment/Plan Acute diverticulitis with abscess. Less likely perforated cancer   IR drainage successful 9/27 Continue broad-spectrum antibiotics He is aware that if he does not respond percutaneous drainage he may need colectomy and colostomy, hopefully he will respond allowing elective outpatient workup including colonoscopy  Recent EGD Recent blood transfusion for anemia Normal colonoscopy 1 year ago in [place] by history. Anemia-suspect GI blood loss. AKI FEN:  IV fluids/clear liquids ID:  Cipro-Flagyl 9/26; Zosyn 09/16/17 =>> day 3 DVT:  SCD; he can have anticoagulants from our standpoint for DVT prophylaxis   plan: Advance to full liquids continue antibiotics   LOS: 2 days    Alycia Cooperwood 09/18/2017 [phone]

## 2017-09-19 DIAGNOSIS — E876 Hypokalemia: Secondary | ICD-10-CM

## 2017-09-19 LAB — CBC WITH DIFFERENTIAL/PLATELET
BASOS ABS: 0 10*3/uL (ref 0.0–0.1)
Basophils Relative: 1 %
EOS ABS: 0.4 10*3/uL (ref 0.0–0.7)
EOS PCT: 6 %
HCT: 28.4 % — ABNORMAL LOW (ref 39.0–52.0)
Hemoglobin: 8.6 g/dL — ABNORMAL LOW (ref 13.0–17.0)
LYMPHS PCT: 19 %
Lymphs Abs: 1.1 10*3/uL (ref 0.7–4.0)
MCH: 22.8 pg — ABNORMAL LOW (ref 26.0–34.0)
MCHC: 30.3 g/dL (ref 30.0–36.0)
MCV: 75.3 fL — ABNORMAL LOW (ref 78.0–100.0)
Monocytes Absolute: 0.5 10*3/uL (ref 0.1–1.0)
Monocytes Relative: 8 %
Neutro Abs: 3.7 10*3/uL (ref 1.7–7.7)
Neutrophils Relative %: 66 %
Platelets: 536 10*3/uL — ABNORMAL HIGH (ref 150–400)
RBC: 3.77 MIL/uL — AB (ref 4.22–5.81)
RDW: 20.1 % — ABNORMAL HIGH (ref 11.5–15.5)
WBC: 5.7 10*3/uL (ref 4.0–10.5)

## 2017-09-19 LAB — BASIC METABOLIC PANEL
ANION GAP: 3 — AB (ref 5–15)
BUN: <5 mg/dL — ABNORMAL LOW (ref 6–20)
CO2: 28 mmol/L (ref 22–32)
Calcium: 7.8 mg/dL — ABNORMAL LOW (ref 8.9–10.3)
Chloride: 105 mmol/L (ref 101–111)
Creatinine, Ser: 0.75 mg/dL (ref 0.61–1.24)
GFR calc Af Amer: >60 mL/min (ref >60)
GLUCOSE: 96 mg/dL (ref 65–99)
POTASSIUM: 3.3 mmol/L — AB (ref 3.5–5.1)
Sodium: 136 mmol/L (ref 135–145)

## 2017-09-19 LAB — MAGNESIUM: Magnesium: 1.6 mg/dL — ABNORMAL LOW (ref 1.7–2.4)

## 2017-09-19 MED ORDER — CHLORPROMAZINE HCL 25 MG PO TABS
25.0000 mg | ORAL_TABLET | Freq: Once | ORAL | Status: AC
  Administered 2017-09-19: 25 mg via ORAL
  Filled 2017-09-19: qty 1

## 2017-09-19 MED ORDER — MAGNESIUM SULFATE 2 GM/50ML IV SOLN
2.0000 g | Freq: Once | INTRAVENOUS | Status: AC
  Administered 2017-09-19: 2 g via INTRAVENOUS
  Filled 2017-09-19: qty 50

## 2017-09-19 MED ORDER — POTASSIUM CHLORIDE CRYS ER 20 MEQ PO TBCR
60.0000 meq | EXTENDED_RELEASE_TABLET | Freq: Once | ORAL | Status: AC
  Administered 2017-09-19: 60 meq via ORAL
  Filled 2017-09-19: qty 3

## 2017-09-19 NOTE — Progress Notes (Signed)
CC: abdominal pain, hiccups and unable to funciton  Subjective: He looks great his only complaint is he still has hiccups.he rates his pain is only one and points to his drain. Drainage is clear serosanguineous fluid.  Objective: Vital signs in last 24 hours: Temp:  [97.6 F (36.4 C)-98.6 F (37 C)] 98.6 F (37 C) (09/30 0615) Pulse Rate:  [64-82] 64 (09/30 0615) Resp:  [20] 20 (09/30 0615) BP: (132-139)/(62-75) 132/75 (09/30 0615) SpO2:  [98 %-100 %] 100 % (09/30 0615) Weight:  [91.5 kg (201 lb 11.5 oz)] 91.5 kg (201 lb 11.5 oz) (09/30 0500) Last BM Date: 09/15/17 240 PO 100 IV Urine 450 Drain 50 Stool x 2 Afebrile, VSS K+ 3.3/Mag 1.6 - being replaced Anemia stable WBC is normal  Intake/Output from previous day: 09/29 0701 - 09/30 0700 In: 345 [P.O.:240; IV Piggyback:100] Out: 500 [Urine:450; Drains:50] Intake/Output this shift: No intake/output data recorded.  General appearance: alert, cooperative and no distress GI: soft, non-tender; bowel sounds normal; no masses,  no organomegaly and 's little tender over the drain site. Drainage is serosanguineous mostly clear.  Lab Results:   Recent Labs  09/18/17 0422 09/19/17 0332  WBC 6.2 5.7  HGB 8.1* 8.6*  HCT 26.0* 28.4*  PLT 538* 536*    BMET  Recent Labs  09/18/17 0422 09/19/17 0332  NA 136 136  K 3.6 3.3*  CL 104 105  CO2 27 28  GLUCOSE 96 96  BUN 6 <5*  CREATININE 0.78 0.75  CALCIUM 7.6* 7.8*   PT/INR No results for input(s): LABPROT, INR in the last 72 hours.   Recent Labs Lab 09/15/17 1801 09/17/17 0500  AST 18 19  ALT 13* 10*  ALKPHOS 86 61  BILITOT 0.3 0.6  PROT 7.3 5.7*  ALBUMIN 2.5* 1.9*     Lipase     Component Value Date/Time   LIPASE 22 07/23/2017 1356     Medications: . docusate sodium  200 mg Oral BID  . pantoprazole  40 mg Oral BID AC  . potassium chloride  60 mEq Oral Once  . sodium chloride flush  5 mL Intravenous Q8H   . magnesium sulfate 1 - 4 g bolus  IVPB    . piperacillin-tazobactam (ZOSYN)  IV Stopped (09/19/17 0821)   Anti-infectives    Start     Dose/Rate Route Frequency Ordered Stop   09/16/17 0600  piperacillin-tazobactam (ZOSYN) IVPB 3.375 g     3.375 g 12.5 mL/hr over 240 Minutes Intravenous Every 8 hours 09/16/17 0215     09/15/17 2345  ciprofloxacin (CIPRO) IVPB 400 mg     400 mg 200 mL/hr over 60 Minutes Intravenous  Once 09/15/17 2336 09/16/17 0150   09/15/17 2345  metroNIDAZOLE (FLAGYL) IVPB 500 mg     500 mg 100 mL/hr over 60 Minutes Intravenous  Once 09/15/17 2336 09/16/17 0150      Assessment/Plan Acute diverticulitis with abscess. Less likely perforated cancer IR drainage successful 9/27 Continue broad-spectrum antibiotics He is aware that if he does not respond percutaneous drainage he may need colectomy and colostomy, hopefully he will respond allowing elective outpatient workup including colonoscopy  Recent EGD Recent blood transfusion for anemia Normal colonoscopy 1 year ago in [place] by history. Anemia-suspect GI blood loss. Hypokalemia/hypomagnesemia   - being replaced FEN:  IV fluids/full liquids ID:  Cipro-Flagyl 9/26; Zosyn 09/16/17 =>> day 4 DVT:  SCD; he can have anticoagulants from our standpoint for DVT prophylaxis  Plan: Advance his  diet. Discussed timing for repeat CT scan, and conversion to oral antibiotics.  CT 9/26 /Drain 9/27       LOS: 3 days    Roberto Owen 09/19/2017 [phone]

## 2017-09-19 NOTE — Progress Notes (Signed)
Patient ID: Roberto Owen, male   DOB: 1935/06/07, 82 y.o.   MRN: [id number]  PROGRESS NOTE    Roberto Owen  DPO:[id number] DOB: [birth year] DOA: 09/15/2017 PCP: System, Pcp Not In   Brief Narrative:  82 year old male with history of microcytic anemia and peptic ulcer disease presented on 09/15/2017 with abdominal pain and was found to have diverticulitis with probable abscess. IV antibiotics were started and general surgery was consulted. Patient underwent IR guided drain placement on 09/16/2017.   Assessment & Plan:   Principal Problem:   Diverticulitis of large intestine with abscess Active Problems:   Microcytic anemia   Anemia due to GI blood loss   Hyponatremia   PUD (peptic ulcer disease)   AKI (acute kidney injury) ([place])   Diverticulitis of intestine with abscess   Orthostasis   Diverticular disease of intestine with perforation and abscess   1. Acute diverticulitis with abscess with probable contained perforation - General surgery following. Status post IR guided drain placement on 09/16/2017. culture inconclusive. - Continue Zosyn - Patient might need outpatient GI evaluation for probable colonoscopy after completion of antibiotic treatment  - Diet advancement as per general surgery. Timing of repeat CAT scan will be decided by general surgery. - abdominal pain has much improved  2. Acute kidney injury  - Resolved   3. PUD  - Pt admitted last month with microcytic anemia and heme-positive stools  - EGD revealed non-bleeding ulcers in esophagus and stomach  -Continue Protonix twice a day  4. Microcytic anemia  - Hemoglobin stable. Outpatient follow-up with GI  5. Hyponatremia - Probably from hypovolemia. Resolved    6. Leukocytosis - resolved.   7. Thrombocytosis - Probably reactive. Repeat a.m. Labs  8. Hypokalemia - Replace, repeat a.m. Labs  9. Hypomagnesemia -Replace. Repeat a.m. labs   DVT prophylaxis: SCDs Code Status:  Full Family  Communication: None at bedside Disposition Plan: Home in 1-3 days once cleared by surgery  Consultants: Gen. surgery and IR  Procedures: IR guided percutaneous drainage on 09/16/2017  Antimicrobials: Zosyn from 09/15/2017    Subjective: Patient seen and examined at bedside. He feels much better. His abdominal pain is improving. No overnight fever or vomiting. He is tolerating diet  Objective: Vitals:   09/18/17 1807 09/18/17 2100 09/19/17 0500 09/19/17 0615  BP: 136/74 137/62  132/75  Pulse: 82 69  64  Resp: 20 20  20   Temp: 98.3 F (36.8 C) 98.1 F (36.7 C)  98.6 F (37 C)  TempSrc: Oral Oral  Oral  SpO2: 100% 98%  100%  Weight:   91.5 kg (201 lb 11.5 oz)   Height:        Intake/Output Summary (Last 24 hours) at 09/19/17 1358 Last data filed at 09/19/17 0615  Gross per 24 hour  Intake              345 ml  Output              300 ml  Net               45 ml   Filed Weights   09/16/17 0315 09/18/17 0500 09/19/17 0500  Weight: 91.8 kg (202 lb 4.8 oz) 91.5 kg (201 lb 11.5 oz) 91.5 kg (201 lb 11.5 oz)    Examination:  General exam: No acute distress Respiratory system: Bilateral decreased breath sound at bases Cardiovascular system: S1 & S2 heard, Rate controlled.  Gastrointestinal system: Abdomen is nondistended, soft and nontender with left-sided dressing  and left lower quadrant drain present Extremities: No cyanosis, clubbing, edema    Data Reviewed: I have personally reviewed following labs and imaging studies  CBC:  Recent Labs Lab 09/15/17 1801 09/16/17 0509 09/17/17 0500 09/18/17 0422 09/19/17 0332  WBC 21.3* 19.6* 12.2* 6.2 5.7  NEUTROABS  --  17.4* 10.0*  --  3.7  HGB 9.3* 8.2* 8.3* 8.1* 8.6*  HCT 29.3* 26.3* 27.1* 26.0* 28.4*  MCV 73.6* 73.5* 74.2* 73.9* 75.3*  PLT 602* 523* 468* 538* 836*   Basic Metabolic Panel:  Recent Labs Lab 09/15/17 1801 09/16/17 0509 09/17/17 0500 09/18/17 0422 09/19/17 0332  NA 131* 132* 137 136 136  K 3.8  3.5 3.9 3.6 3.3*  CL 96* 98* 106 104 105  CO2 23 25 25 27 28   GLUCOSE 115* 103* 87 96 96  BUN 11 11 8 6  <5*  CREATININE 1.30* 1.02 0.86 0.78 0.75  CALCIUM 8.5* 7.8* 7.9* 7.6* 7.8*  MG  --   --  1.8 1.8 1.6*   GFR: Estimated Creatinine Clearance: 95.2 mL/min (by C-G formula based on SCr of 0.75 mg/dL). Liver Function Tests:  Recent Labs Lab 09/15/17 1801 09/17/17 0500  AST 18 19  ALT 13* 10*  ALKPHOS 86 61  BILITOT 0.3 0.6  PROT 7.3 5.7*  ALBUMIN 2.5* 1.9*   No results for input(s): LIPASE, AMYLASE in the last 168 hours. No results for input(s): AMMONIA in the last 168 hours. Coagulation Profile:  Recent Labs Lab 09/16/17 0736  INR 1.18   Cardiac Enzymes: No results for input(s): CKTOTAL, CKMB, CKMBINDEX, TROPONINI in the last 168 hours. BNP (last 3 results) No results for input(s): PROBNP in the last 8760 hours. HbA1C: No results for input(s): HGBA1C in the last 72 hours. CBG: No results for input(s): GLUCAP in the last 168 hours. Lipid Profile: No results for input(s): CHOL, HDL, LDLCALC, TRIG, CHOLHDL, LDLDIRECT in the last 72 hours. Thyroid Function Tests: No results for input(s): TSH, T4TOTAL, FREET4, T3FREE, THYROIDAB in the last 72 hours. Anemia Panel: No results for input(s): VITAMINB12, FOLATE, FERRITIN, TIBC, IRON, RETICCTPCT in the last 72 hours. Sepsis Labs:  Recent Labs Lab 09/15/17 2352  LATICACIDVEN 1.05    Recent Results (from the past 240 hour(s))  Blood culture (routine x 2)     Status: None (Preliminary result)   Collection Time: 09/15/17 11:40 PM  Result Value Ref Range Status   Specimen Description BLOOD RIGHT HAND  Final   Special Requests IN PEDIATRIC BOTTLE Blood Culture adequate volume  Final   Culture NO GROWTH 2 DAYS  Final   Report Status PENDING  Incomplete  Blood culture (routine x 2)     Status: None (Preliminary result)   Collection Time: 09/15/17 11:45 PM  Result Value Ref Range Status   Specimen Description BLOOD LEFT  ANTECUBITAL  Final   Special Requests   Final    BOTTLES DRAWN AEROBIC AND ANAEROBIC Blood Culture adequate volume   Culture NO GROWTH 2 DAYS  Final   Report Status PENDING  Incomplete  Aerobic/Anaerobic Culture (surgical/deep wound)     Status: Abnormal   Collection Time: 09/16/17  5:16 PM  Result Value Ref Range Status   Specimen Description ABSCESS  Final   Special Requests COLONIC DIVERTICULAR  Final   Gram Stain   Final    ABUNDANT WBC PRESENT, PREDOMINANTLY PMN ABUNDANT GRAM NEGATIVE RODS ABUNDANT GRAM POSITIVE RODS FEW GRAM POSITIVE COCCI IN PAIRS IN CLUSTERS    Culture (A)  Final  MULTIPLE ORGANISMS PRESENT, NONE PREDOMINANT MIXED ANAEROBIC FLORA PRESENT.  CALL LAB IF FURTHER IID REQUIRED.    Report Status 09/18/2017 FINAL  Final         Radiology Studies: No results found.      Scheduled Meds: . docusate sodium  200 mg Oral BID  . pantoprazole  40 mg Oral BID AC  . sodium chloride flush  5 mL Intravenous Q8H   Continuous Infusions: . piperacillin-tazobactam (ZOSYN)  IV Stopped (09/19/17 6222)     LOS: 3 days        Aline August, MD [facility] Pager [phone]  If 7PM-7AM, please contact night-coverage [email or web address] Password [email or web address] 09/19/2017, 1:58 PM

## 2017-09-20 ENCOUNTER — Inpatient Hospital Stay (HOSPITAL_COMMUNITY): Payer: Medicare Other

## 2017-09-20 DIAGNOSIS — D500 Iron deficiency anemia secondary to blood loss (chronic): Secondary | ICD-10-CM

## 2017-09-20 LAB — CBC WITH DIFFERENTIAL/PLATELET
Basophils Absolute: 0 10*3/uL (ref 0.0–0.1)
Basophils Relative: 0 %
Eosinophils Absolute: 0.3 10*3/uL (ref 0.0–0.7)
Eosinophils Relative: 7 %
HCT: 27 % — ABNORMAL LOW (ref 39.0–52.0)
Hemoglobin: 8.2 g/dL — ABNORMAL LOW (ref 13.0–17.0)
Lymphocytes Relative: 21 %
Lymphs Abs: 0.9 10*3/uL (ref 0.7–4.0)
MCH: 22.7 pg — ABNORMAL LOW (ref 26.0–34.0)
MCHC: 30.4 g/dL (ref 30.0–36.0)
MCV: 74.6 fL — ABNORMAL LOW (ref 78.0–100.0)
Monocytes Absolute: 0.5 10*3/uL (ref 0.1–1.0)
Monocytes Relative: 10 %
Neutro Abs: 2.8 10*3/uL (ref 1.7–7.7)
Neutrophils Relative %: 62 %
Platelets: 502 10*3/uL — ABNORMAL HIGH (ref 150–400)
RBC: 3.62 MIL/uL — ABNORMAL LOW (ref 4.22–5.81)
RDW: 19.4 % — ABNORMAL HIGH (ref 11.5–15.5)
WBC: 4.5 10*3/uL (ref 4.0–10.5)

## 2017-09-20 LAB — BASIC METABOLIC PANEL
Anion gap: 4 — ABNORMAL LOW (ref 5–15)
BUN: <5 mg/dL — ABNORMAL LOW (ref 6–20)
CO2: 26 mmol/L (ref 22–32)
Calcium: 7.8 mg/dL — ABNORMAL LOW (ref 8.9–10.3)
Chloride: 108 mmol/L (ref 101–111)
Creatinine, Ser: 0.8 mg/dL (ref 0.61–1.24)
GFR calc Af Amer: >60 mL/min (ref >60)
GFR calc non Af Amer: >60 mL/min (ref >60)
Glucose, Bld: 93 mg/dL (ref 65–99)
Potassium: 3.8 mmol/L (ref 3.5–5.1)
Sodium: 138 mmol/L (ref 135–145)

## 2017-09-20 LAB — MAGNESIUM: MAGNESIUM: 1.8 mg/dL (ref 1.7–2.4)

## 2017-09-20 MED ORDER — AMOXICILLIN-POT CLAVULANATE 875-125 MG PO TABS
1.0000 | ORAL_TABLET | Freq: Two times a day (BID) | ORAL | Status: DC
  Administered 2017-09-20 – 2017-09-21 (×3): 1 via ORAL
  Filled 2017-09-20 (×3): qty 1

## 2017-09-20 MED ORDER — IOPAMIDOL (ISOVUE-300) INJECTION 61%
INTRAVENOUS | Status: AC
  Administered 2017-09-20: 100 mL via INTRAVENOUS
  Filled 2017-09-20: qty 100

## 2017-09-20 NOTE — Progress Notes (Signed)
Patient ID: Roberto Owen, male   DOB: Feb 03, 1935, 82 y.o.   MRN: [id number]  PROGRESS NOTE    Roberto Owen  LGX:[id number] DOB: 01/15/1935 DOA: 09/15/2017 PCP: System, Pcp Not In   Brief Narrative:  82 year old male with history of microcytic anemia and peptic ulcer disease presented on 09/15/2017 with abdominal pain and was found to have diverticulitis with probable abscess. IV antibiotics were started and general surgery was consulted. Patient underwent IR guided drain placement on 09/16/2017.   Assessment & Plan:   Principal Problem:   Diverticulitis of large intestine with abscess Active Problems:   Microcytic anemia   Anemia due to GI blood loss   Hyponatremia   PUD (peptic ulcer disease)   AKI (acute kidney injury) ([place])   Diverticulitis of intestine with abscess   Orthostasis   Diverticular disease of intestine with perforation and abscess   1. Acute diverticulitis with abscess with probable contained perforation - General surgery following. Status post IR guided drain placement on 09/16/2017. culture inconclusive. - Zosyn has been changed to Augmentin by general surgery today.We will follow up with further recommendations from general surgery and IR regarding the drain. Patient wants to go home without the drain - Patient might need outpatient GI evaluation for probable colonoscopy after completion of antibiotic treatment  - Diet as per general surgery. Timing of repeat CAT scan will be decided by general surgery/IR - abdominal pain has much improved  2. Acute kidney injury  - Resolved   3. PUD  - Pt admitted last month with microcytic anemia and heme-positive stools  - EGD revealed non-bleeding ulcers in esophagus and stomach  -Continue Protonix twice a day  4. Microcytic anemia  - Hemoglobin stable. Outpatient follow-up with GI  5. Hyponatremia - Probably from hypovolemia. Resolved    6. Leukocytosis - resolved.   7. Thrombocytosis - Probably  reactive. Repeat a.m. Labs  8. Hypokalemia -improved  9. Hypomagnesemia -Improved   DVT prophylaxis: SCDs Code Status:  Full Family Communication: None at bedside Disposition Plan: Home in 1-2 days once cleared by surgery/IR  Consultants: Gen. surgery and IR  Procedures: IR guided percutaneous drainage on 09/16/2017  Antimicrobials: Zosyn from 09/15/2017    Subjective: Patient seen and examined at bedside. He feels much better. No overnight fever or vomiting. He is tolerating diet  Objective: Vitals:   09/19/17 0615 09/19/17 1441 09/19/17 2030 09/20/17 0537  BP: 132/75 123/77 136/78 118/62  Pulse: 64 96 75 (!) 50  Resp: 20 20 20 20   Temp: 98.6 F (37 C) 98.1 F (36.7 C) 98.4 F (36.9 C) 98.2 F (36.8 C)  TempSrc: Oral Oral Oral Oral  SpO2: 100% 100% 100% 100%  Weight:      Height:        Intake/Output Summary (Last 24 hours) at 09/20/17 1200 Last data filed at 09/20/17 1028  Gross per 24 hour  Intake              595 ml  Output             1005 ml  Net             -410 ml   Filed Weights   09/16/17 0315 09/18/17 0500 09/19/17 0500  Weight: 91.8 kg (202 lb 4.8 oz) 91.5 kg (201 lb 11.5 oz) 91.5 kg (201 lb 11.5 oz)    Examination:  General exam: No acute distress Respiratory system: Bilateral decreased breath sound at bases Cardiovascular system: S1 & S2 heard, Rate  controlled.  Gastrointestinal system: Abdomen is nondistended, soft and nontender with left-sided dressing and left lower quadrant drain present Extremities: No cyanosis, clubbing, edema    Data Reviewed: I have personally reviewed following labs and imaging studies  CBC:  Recent Labs Lab 09/16/17 0509 09/17/17 0500 09/18/17 0422 09/19/17 0332 09/20/17 0440  WBC 19.6* 12.2* 6.2 5.7 4.5  NEUTROABS 17.4* 10.0*  --  3.7 2.8  HGB 8.2* 8.3* 8.1* 8.6* 8.2*  HCT 26.3* 27.1* 26.0* 28.4* 27.0*  MCV 73.5* 74.2* 73.9* 75.3* 74.6*  PLT 523* 468* 538* 536* 026*   Basic Metabolic  Panel:  Recent Labs Lab 09/16/17 0509 09/17/17 0500 09/18/17 0422 09/19/17 0332 09/20/17 0440  NA 132* 137 136 136 138  K 3.5 3.9 3.6 3.3* 3.8  CL 98* 106 104 105 108  CO2 25 25 27 28 26   GLUCOSE 103* 87 96 96 93  BUN 11 8 6  <5* <5*  CREATININE 1.02 0.86 0.78 0.75 0.80  CALCIUM 7.8* 7.9* 7.6* 7.8* 7.8*  MG  --  1.8 1.8 1.6* 1.8   GFR: Estimated Creatinine Clearance: 95.2 mL/min (by C-G formula based on SCr of 0.8 mg/dL). Liver Function Tests:  Recent Labs Lab 09/15/17 1801 09/17/17 0500  AST 18 19  ALT 13* 10*  ALKPHOS 86 61  BILITOT 0.3 0.6  PROT 7.3 5.7*  ALBUMIN 2.5* 1.9*   No results for input(s): LIPASE, AMYLASE in the last 168 hours. No results for input(s): AMMONIA in the last 168 hours. Coagulation Profile:  Recent Labs Lab 09/16/17 0736  INR 1.18   Cardiac Enzymes: No results for input(s): CKTOTAL, CKMB, CKMBINDEX, TROPONINI in the last 168 hours. BNP (last 3 results) No results for input(s): PROBNP in the last 8760 hours. HbA1C: No results for input(s): HGBA1C in the last 72 hours. CBG: No results for input(s): GLUCAP in the last 168 hours. Lipid Profile: No results for input(s): CHOL, HDL, LDLCALC, TRIG, CHOLHDL, LDLDIRECT in the last 72 hours. Thyroid Function Tests: No results for input(s): TSH, T4TOTAL, FREET4, T3FREE, THYROIDAB in the last 72 hours. Anemia Panel: No results for input(s): VITAMINB12, FOLATE, FERRITIN, TIBC, IRON, RETICCTPCT in the last 72 hours. Sepsis Labs:  Recent Labs Lab 09/15/17 2352  LATICACIDVEN 1.05    Recent Results (from the past 240 hour(s))  Blood culture (routine x 2)     Status: None (Preliminary result)   Collection Time: 09/15/17 11:40 PM  Result Value Ref Range Status   Specimen Description BLOOD RIGHT HAND  Final   Special Requests IN PEDIATRIC BOTTLE Blood Culture adequate volume  Final   Culture NO GROWTH 4 DAYS  Final   Report Status PENDING  Incomplete  Blood culture (routine x 2)     Status:  None (Preliminary result)   Collection Time: 09/15/17 11:45 PM  Result Value Ref Range Status   Specimen Description BLOOD LEFT ANTECUBITAL  Final   Special Requests   Final    BOTTLES DRAWN AEROBIC AND ANAEROBIC Blood Culture adequate volume   Culture NO GROWTH 4 DAYS  Final   Report Status PENDING  Incomplete  Aerobic/Anaerobic Culture (surgical/deep wound)     Status: Abnormal   Collection Time: 09/16/17  5:16 PM  Result Value Ref Range Status   Specimen Description ABSCESS  Final   Special Requests COLONIC DIVERTICULAR  Final   Gram Stain   Final    ABUNDANT WBC PRESENT, PREDOMINANTLY PMN ABUNDANT GRAM NEGATIVE RODS ABUNDANT GRAM POSITIVE RODS FEW GRAM POSITIVE COCCI IN PAIRS  IN CLUSTERS    Culture (A)  Final    MULTIPLE ORGANISMS PRESENT, NONE PREDOMINANT MIXED ANAEROBIC FLORA PRESENT.  CALL LAB IF FURTHER IID REQUIRED.    Report Status 09/18/2017 FINAL  Final         Radiology Studies: No results found.      Scheduled Meds: . amoxicillin-clavulanate  1 tablet Oral Q12H  . docusate sodium  200 mg Oral BID  . pantoprazole  40 mg Oral BID AC  . sodium chloride flush  5 mL Intravenous Q8H   Continuous Infusions:    LOS: 4 days        Aline August, MD [facility] Pager [phone]  If 7PM-7AM, please contact night-coverage [email or web address] Password [email or web address] 09/20/2017, 12:00 PM

## 2017-09-20 NOTE — Discharge Instructions (Signed)

## 2017-09-20 NOTE — Progress Notes (Signed)
Patient ID: Roberto Owen, male   DOB: 09/05/1935, 82 y.o.   MRN: [id number]    Referring Physician(s): Dr. Fanny Skates  Supervising Physician: Marybelle Killings  Patient Status: [facility] - In-pt  Chief Complaint: Diverticular abscess  Subjective: Patient wanting to go home, but really wants to go home without his drain in place.  He denies any pain.  Allergies: Patient has no known allergies.  Medications: Prior to Admission medications   Medication Sig Start Date End Date Taking? Authorizing Provider  ferrous sulfate 325 (65 FE) MG tablet Take 1 tablet (325 mg total) by mouth daily with breakfast. 07/27/17  Yes Tawny Asal, MD  Multiple Vitamins-Minerals (ADULT ONE DAILY GUMMIES) CHEW Chew 1 tablet by mouth daily.    Yes [provider]  pantoprazole (PROTONIX) 40 MG tablet TAKE 1 TABLET BY MOUTH TWICE DAILY 09/10/17  Yes Lorella Nimrod, MD    Vital Signs: BP 118/62 (BP Location: Left Arm)   Pulse (!) 50   Temp 98.2 F (36.8 C) (Oral)   Resp 20   Ht 6\' 6"  (1.981 m)   Wt 201 lb 11.5 oz (91.5 kg)   SpO2 100%   BMI 23.31 kg/m   Physical Exam: Abd: soft, NT, ND, +BS, drain with minimal output currently.  Flushes well.  30cc documented yesterday.  Imaging: Ct Image Guided Drainage By Percutaneous Catheter  Result Date: 09/16/2017 INDICATION: 82 year old male with a history of diverticular abscess EXAM: CT GUIDED DRAINAGE OF ABDOMINAL ABSCESS MEDICATIONS: The patient is currently admitted to the hospital and receiving intravenous antibiotics. The antibiotics were administered within an appropriate time frame prior to the initiation of the procedure. ANESTHESIA/SEDATION: 1.0 mg IV Versed 50 mcg IV Fentanyl Moderate Sedation Time:  10 minutes The patient was continuously monitored during the procedure by the interventional radiology nurse under my direct supervision. COMPLICATIONS: None TECHNIQUE: Informed written consent was obtained from the patient after a thorough  discussion of the procedural risks, benefits and alternatives. All questions were addressed. Maximal Sterile Barrier Technique was utilized including caps, mask, sterile gowns, sterile gloves, sterile drape, hand hygiene and skin antiseptic. A timeout was performed prior to the initiation of the procedure. PROCEDURE: The left lower abdomen was prepped with chlorhexidine in a sterile fashion, and a sterile drape was applied covering the operative field. A sterile gown and sterile gloves were used for the procedure. Local anesthesia was provided with 1% Lidocaine. After a scout CT, the patient is prepped and draped in the usual sterile fashion. 1% lidocaine was used for local anesthesia. A small stab incision was made, and using CT guidance, trocar needle was advanced into the fluid and gas collection in the left abdomen. Once we confirmed needle tip position, modified Seldinger technique was used to place a 12 [place] drain into the collection. Frankly purulent material was aspirated. Pigtail drain was sutured in position and attached to gravity drainage. Approximately 50 cc of fluid removed. Sample was sent to the lab for analysis. Patient tolerated the procedure well and remained hemodynamically stable throughout. No complications were encountered and no significant blood loss. FINDINGS: Fluid and gas collection the left lower quadrant adjacent to the sigmoid colon similar to the comparison CT. Final image demonstrates placement of pigtail catheter into the collection. Approximately 50 cc of frankly purulent material aspirated. IMPRESSION: Status post CT-guided drainage of left lower quadrant diverticular abscess. Signed, Dulcy Fanny. Earleen Newport, DO [facility] Electronically Signed   By: Corrie Mckusick D.O.  On: 09/16/2017 17:27    Labs:  CBC:  Recent Labs  09/17/17 0500 09/18/17 0422 09/19/17 0332 09/20/17 0440  WBC 12.2* 6.2 5.7 4.5  HGB 8.3* 8.1*  8.6* 8.2*  HCT 27.1* 26.0* 28.4* 27.0*  PLT 468* 538* 536* 502*    COAGS:  Recent Labs  07/23/17 1328 09/16/17 0736  INR 1.09 1.18  APTT 27  --     BMP:  Recent Labs  09/17/17 0500 09/18/17 0422 09/19/17 0332 09/20/17 0440  NA 137 136 136 138  K 3.9 3.6 3.3* 3.8  CL 106 104 105 108  CO2 25 27 28 26   GLUCOSE 87 96 96 93  BUN 8 6 <5* <5*  CALCIUM 7.9* 7.6* 7.8* 7.8*  CREATININE 0.86 0.78 0.75 0.80  GFRNONAA >60 >60 >60 >60  GFRAA >60 >60 >60 >60    LIVER FUNCTION TESTS:  Recent Labs  07/23/17 1356 09/15/17 1801 09/17/17 0500  BILITOT 0.6 0.3 0.6  AST 19 18 19   ALT 8* 13* 10*  ALKPHOS 64 86 61  PROT 7.2 7.3 5.7*  ALBUMIN 3.0* 2.5* 1.9*    Assessment and Plan: 1. Diverticular abscess, s/p perc drain  Patient doing well with his drain and having no pain. Drain with minimal output today.  30cc documented yesterday. Patient really wants drain out prior to being discharged, but he just had this placed 4 days ago.  I will d/w Dr. Barbie Banner to determine if we should rescan him prior to discharge.  Otherwise he can follow up in our drain clinic later this week for a repeat scan and drain injection.  Electronically Signed: Henreitta Cea 09/20/2017, 11:34 AM   I spent a total of 15 Minutes at the the patient's bedside AND on the patient's hospital floor or unit, greater than 50% of which was counseling/coordinating care for diverticular abscess

## 2017-09-20 NOTE — Progress Notes (Signed)
Patient ID: Roberto Owen, male   DOB: 1935-02-10, 82 y.o.   MRN: [id number]  [facility] Surgery Progress Note     Subjective: CC- diverticulitis Sitting up in chair. No complaints this morning. Tolerating soft diet. Denies any abdominal pain or n/v. Having loose/soft BMs.  WBC is WNL.  Objective: Vital signs in last 24 hours: Temp:  [98.1 F (36.7 C)-98.4 F (36.9 C)] 98.2 F (36.8 C) (10/01 0537) Pulse Rate:  [50-96] 50 (10/01 0537) Resp:  [20] 20 (10/01 0537) BP: (118-136)/(62-78) 118/62 (10/01 0537) SpO2:  [100 %] 100 % (10/01 0537) Last BM Date: 09/19/17  Intake/Output from previous day: 09/30 0701 - 10/01 0700 In: 355 [P.O.:240; I.V.:10; IV Piggyback:100] Out: 730 [Urine:700; Drains:30] Intake/Output this shift: No intake/output data recorded.  PE: Gen:  Alert, NAD, pleasant HEENT: EOM's intact, pupils equal and round Pulm:  CTAB, no W/R/R, effort normal Abd: Soft, ND, +BS, drain with no fluid in bag, no abdominal tenderness Psych: A&Ox3  Skin: no rashes noted, warm and dry  Lab Results:   Recent Labs  09/19/17 0332 09/20/17 0440  WBC 5.7 4.5  HGB 8.6* 8.2*  HCT 28.4* 27.0*  PLT 536* 502*   BMET  Recent Labs  09/19/17 0332 09/20/17 0440  NA 136 138  K 3.3* 3.8  CL 105 108  CO2 28 26  GLUCOSE 96 93  BUN <5* <5*  CREATININE 0.75 0.80  CALCIUM 7.8* 7.8*   PT/INR No results for input(s): LABPROT, INR in the last 72 hours. CMP     Component Value Date/Time   NA 138 09/20/2017 0440   K 3.8 09/20/2017 0440   CL 108 09/20/2017 0440   CO2 26 09/20/2017 0440   GLUCOSE 93 09/20/2017 0440   BUN <5 (L) 09/20/2017 0440   CREATININE 0.80 09/20/2017 0440   CALCIUM 7.8 (L) 09/20/2017 0440   PROT 5.7 (L) 09/17/2017 0500   ALBUMIN 1.9 (L) 09/17/2017 0500   AST 19 09/17/2017 0500   ALT 10 (L) 09/17/2017 0500   ALKPHOS 61 09/17/2017 0500   BILITOT 0.6 09/17/2017 0500   GFRNONAA >60 09/20/2017 0440   GFRAA >60 09/20/2017 0440   Lipase      Component Value Date/Time   LIPASE 22 07/23/2017 1356       Studies/Results: No results found.  Anti-infectives: Anti-infectives    Start     Dose/Rate Route Frequency Ordered Stop   09/16/17 0600  piperacillin-tazobactam (ZOSYN) IVPB 3.375 g     3.375 g 12.5 mL/hr over 240 Minutes Intravenous Every 8 hours 09/16/17 0215     09/15/17 2345  ciprofloxacin (CIPRO) IVPB 400 mg     400 mg 200 mL/hr over 60 Minutes Intravenous  Once 09/15/17 2336 09/16/17 0150   09/15/17 2345  metroNIDAZOLE (FLAGYL) IVPB 500 mg     500 mg 100 mL/hr over 60 Minutes Intravenous  Once 09/15/17 2336 09/16/17 0150       Assessment/Plan Recent EGD PUD - protonix BID Recent blood transfusion for anemia Normal colonoscopy 1 year ago in [place] by history. Anemia-suspect GI blood loss. Hg 8.2, stable AKI - resolved  Acute diverticulitis with abscess. Less likely perforated cancer - s/p IR drainage 9/27 - culture growing MULTIPLE ORGANISMS PRESENT, NONE PREDOMINANT  MIXED ANAEROBIC FLORA PRESENT - drain with 30cc/24hr  - He is aware that if he does not respond percutaneous drainage he may need colectomy and colostomy, hopefully he will respond allowing elective outpatient workup including colonoscopy  FEN:  Soft  diet ID:  Cipro-Flagyl 9/26; Zosyn 09/16/17>>10/1. Augmentin 10/1>> DVT:  SCD; ok for anticoagulants from our standpoint for DVT prophylaxis  Plan: transition to oral antibiotics (augmentin). Will discuss repeating CT drain injection with IR (which he may do while inpatient or after discharge). Otherwise patient is ready for discharge from our standpoint. He may follow up with Dr. Dalbert Batman in about 2 weeks.   LOS: 4 days    [place] , [facility] Surgery 09/20/2017, 8:40 AM Pager: [phone] Consults: [phone] Mon-Fri 7:00 am-4:30 pm Sat-Sun 7:00 am-11:30 am

## 2017-09-21 LAB — CBC WITH DIFFERENTIAL/PLATELET
BASOS ABS: 0 10*3/uL (ref 0.0–0.1)
BASOS PCT: 1 %
Eosinophils Absolute: 0.3 10*3/uL (ref 0.0–0.7)
Eosinophils Relative: 6 %
HEMATOCRIT: 26.8 % — AB (ref 39.0–52.0)
HEMOGLOBIN: 8.1 g/dL — AB (ref 13.0–17.0)
LYMPHS PCT: 24 %
Lymphs Abs: 1.1 10*3/uL (ref 0.7–4.0)
MCH: 22.7 pg — ABNORMAL LOW (ref 26.0–34.0)
MCHC: 30.2 g/dL (ref 30.0–36.0)
MCV: 75.1 fL — AB (ref 78.0–100.0)
MONO ABS: 0.3 10*3/uL (ref 0.1–1.0)
Monocytes Relative: 8 %
NEUTROS ABS: 2.7 10*3/uL (ref 1.7–7.7)
NEUTROS PCT: 61 %
Platelets: 478 10*3/uL — ABNORMAL HIGH (ref 150–400)
RBC: 3.57 MIL/uL — AB (ref 4.22–5.81)
RDW: 19.7 % — AB (ref 11.5–15.5)
WBC: 4.4 10*3/uL (ref 4.0–10.5)

## 2017-09-21 LAB — CULTURE, BLOOD (ROUTINE X 2)
Culture: NO GROWTH
Culture: NO GROWTH
Special Requests: ADEQUATE
Special Requests: ADEQUATE

## 2017-09-21 LAB — BASIC METABOLIC PANEL
ANION GAP: 6 (ref 5–15)
BUN: <5 mg/dL — ABNORMAL LOW (ref 6–20)
CALCIUM: 7.8 mg/dL — AB (ref 8.9–10.3)
CHLORIDE: 103 mmol/L (ref 101–111)
CO2: 28 mmol/L (ref 22–32)
Creatinine, Ser: 0.81 mg/dL (ref 0.61–1.24)
GFR calc non Af Amer: >60 mL/min (ref >60)
GLUCOSE: 98 mg/dL (ref 65–99)
POTASSIUM: 3.5 mmol/L (ref 3.5–5.1)
Sodium: 137 mmol/L (ref 135–145)

## 2017-09-21 LAB — MAGNESIUM: Magnesium: 1.7 mg/dL (ref 1.7–2.4)

## 2017-09-21 MED ORDER — TRAMADOL HCL 50 MG PO TABS: 50.0000 mg | ORAL_TABLET | Freq: Four times a day (QID) | ORAL | 0 refills | Status: DC | PRN

## 2017-09-21 MED ORDER — DOCUSATE SODIUM 100 MG PO CAPS: 100.0000 mg | ORAL_CAPSULE | Freq: Two times a day (BID) | ORAL | 0 refills | Status: AC

## 2017-09-21 MED ORDER — AMOXICILLIN-POT CLAVULANATE 875-125 MG PO TABS: 1.0000 | ORAL_TABLET | Freq: Two times a day (BID) | ORAL | 0 refills | Status: AC

## 2017-09-21 NOTE — Discharge Summary (Signed)
Physician Discharge Summary  Chadley Dziedzic FIE:[id number] DOB: May 27, 1935 DOA: 09/15/2017  PCP: System, Pcp Not In  Admit date: 09/15/2017 Discharge date: 09/21/2017  Admitted From: home Disposition:  home  Recommendations for Outpatient Follow-up:  1. Follow up with PCP in 1-2 weeks 2. Please obtain BMP/CBC in one week 3. Follow-up with general surgery as scheduled 4. Follow up with IR/drain clinic as scheduled. Drain care as per IR recommendations  Home Health: no  Equipment/Devices: abdominal drain  Discharge Condition: stable  CODE STATUS: full  Diet recommendation: soft diet  Brief/Interim Summary: 82 year old male with history of microcytic anemia and peptic ulcer disease presented on 09/15/2017 with abdominal pain and was found to have diverticulitis with probable abscess. IV antibiotics were started and general surgery was consulted. Patient underwent IR guided drain placement on 09/16/2017. Cultures were inconclusive. Patient has been afebrile and tolerating diet. Repeat CAT scan from 09/20/2017 showed significant improvement. He'll be discharged home on oral antibiotics and abdominal drain to follow-up with general surgery and IR/drain clinic.   Discharge Diagnoses:  Principal Problem:   Diverticulitis of large intestine with abscess Active Problems:   Microcytic anemia   Anemia due to GI blood loss   Hyponatremia   PUD (peptic ulcer disease)   AKI (acute kidney injury) ([place])   Diverticulitis of intestine with abscess   Orthostasis   Diverticular disease of intestine with perforation and abscess  1. Acute diverticulitis with abscess with probable contained perforation - Status post IR guided drain placement on 09/16/2017. culture inconclusive. - initially started on Zosyn which was changed to Augmentin by general surgery on 09/20/2017. Tolerating diet. Afebrile. White count normal. Repeat CAT scan from 09/20/2017 shows much improvement of the collection. Cleared by  general surgery and IR for discharge with the abdominal drain. Continue Augmentin to finish 2 weeks course of antibiotics. Outpatient follow-up with general surgery and IR/drain clinic. - Patient might need outpatient GI evaluation for probable colonoscopy after completion of antibiotic treatment in 4-6 weeks' time. Patient states that he will follow up with his primary care provider and gastroenterologist in [place] where he is from originally. - abdominal pain has resolved  2. Acute kidney injury  - Resolved   3. PUD  - Pt admitted last month with microcytic anemia and heme-positive stools  - EGD revealed non-bleeding ulcers in esophagus and stomach  -Continue Protonix twice a day. outpatient follow-up with GI  4. Microcytic anemia  - Hemoglobin stable. Outpatient follow-up with GI  5. Hyponatremia - Probably from hypovolemia. Resolved   6. Leukocytosis - resolved.   7. Thrombocytosis - Probably reactive. Outpatient follow-up  8. Hypokalemia -improved  9. Hypomagnesemia -Improved  Discharge Instructions  Discharge Instructions    Call MD for:  difficulty breathing, headache or visual disturbances    Complete by:  As directed    Call MD for:  extreme fatigue    Complete by:  As directed    Call MD for:  hives    Complete by:  As directed    Call MD for:  persistant dizziness or light-headedness    Complete by:  As directed    Call MD for:  persistant nausea and vomiting    Complete by:  As directed    Call MD for:  severe uncontrolled pain    Complete by:  As directed    Call MD for:  temperature >100.4    Complete by:  As directed    Diet - low sodium heart healthy  Complete by:  As directed    Discharge instructions    Complete by:  As directed    Soft diet   Increase activity slowly    Complete by:  As directed      Allergies as of 09/21/2017   No Known Allergies     Medication List    TAKE these medications   ADULT ONE DAILY GUMMIES  Chew Chew 1 tablet by mouth daily.   amoxicillin-clavulanate 875-125 MG tablet Commonly known as:  AUGMENTIN Take 1 tablet by mouth every 12 (twelve) hours.   docusate sodium 100 MG capsule Commonly known as:  COLACE Take 1 capsule (100 mg total) by mouth 2 (two) times daily.   ferrous sulfate 325 (65 FE) MG tablet Take 1 tablet (325 mg total) by mouth daily with breakfast.   pantoprazole 40 MG tablet Commonly known as:  PROTONIX TAKE 1 TABLET BY MOUTH TWICE DAILY   traMADol 50 MG tablet Commonly known as:  ULTRAM Take 1 tablet (50 mg total) by mouth every 6 (six) hours as needed for moderate pain or severe pain.      Follow-up Information    Fanny Skates, MD. Go on 10/07/2017.   Specialty:  General Surgery Why:  Your appointment is 10/07/2017 at 11:15AM. Please arrive 30 minutes prior to your appointment to check in and fill out paperwork. Contact information: [address] [place] [address] [phone]        Corrie Mckusick, DO Follow up in 1 week(s).   Specialty:  Interventional Radiology Why:  follow up with Dr Earleen Newport 1 week---Drain clinic will call pt with time and date; call [phone] if questions or concerns Contact information: [address] [place] [address] [phone]          No Known Allergies  Consultations:  General surgery and IR   Procedures/Studies: Ct Abdomen Pelvis W Contrast  Result Date: 09/20/2017 CLINICAL DATA:  Abdominal pain with fever EXAM: CT ABDOMEN AND PELVIS WITH CONTRAST TECHNIQUE: Multidetector CT imaging of the abdomen and pelvis was performed using the standard protocol following bolus administration of intravenous contrast. CONTRAST:  167mL ISOVUE-300 IOPAMIDOL (ISOVUE-300) INJECTION 61% COMPARISON:  09/16/2017, 09/15/2017 FINDINGS: Lower chest: Lung bases demonstrate no acute consolidation or pleural effusion. Normal heart size. Coarse pericardial calcification at the base of the  heart. Hepatobiliary: Calcified granuloma. No calcified gallstones or biliary dilatation Pancreas: Unremarkable. No pancreatic ductal dilatation or surrounding inflammatory changes. Spleen: Normal in size without focal abnormality. Adrenals/Urinary Tract: Adrenal glands are within normal limits. Early excretion of contrast into the collecting systems. No hydronephrosis. Right posterior bladder diverticulum Stomach/Bowel: Moderate debris in the stomach. No dilated small bowel. Moderate stool in the colon. Residual wall thickening of the distal descending/ sigmoid colon. Diverticula are present. Normal appendix Vascular/Lymphatic: Aortic atherosclerosis. Small retroperitoneal lymph nodes. Reproductive: Enlarged prostate with mass effect on the posterior bladder Other: Interim placement of left lower quadrant percutaneous drain with substantial decrease in size of previously noted heterogenous gas and fluid collection adjacent to the colon. Moderate residual inflammatory change. No significant residual abscess fluid at this time. Musculoskeletal: Degenerative changes. No acute or suspicious lesion. IMPRESSION: 1. Interval placement of left lower quadrant percutaneous drainage catheter with significant decrease in size of previously noted irregular gas and fluid collection adjacent to the sigmoid colon. No significant residual abscess collection at this time. Moderate residual inflammatory process in the left lower quadrant with residual colon wall thickening observed. Colonoscopy follow-up after  resolution of acute episode should be considered. 2. Coarse calcification at the base of the heart, likely pericardial, suggesting prior pericarditis 3. Enlarged prostate with mass effect on posterior bladder. Moderate right posterior bladder diverticulum containing layering density. Electronically Signed   By: Donavan Foil M.D.   On: 09/20/2017 22:01   Ct Abdomen Pelvis W Contrast  Result Date: 09/15/2017 CLINICAL DATA:   Left-sided abdominal pain. Diverticulitis suspected. EXAM: CT ABDOMEN AND PELVIS WITH CONTRAST TECHNIQUE: Multidetector CT imaging of the abdomen and pelvis was performed using the standard protocol following bolus administration of intravenous contrast. CONTRAST:  128mL ISOVUE-300 IOPAMIDOL (ISOVUE-300) INJECTION 61% COMPARISON:  None. FINDINGS: Lower chest: The lung bases are clear. Small hiatal hernia. Pericardial calcification adjacent to the right atrium. Hepatobiliary: Gallbladder physiologically distended, no calcified stone. No biliary dilatation. Pancreas: No ductal dilatation or inflammation. Spleen: Normal in size without focal abnormality. Adrenals/Urinary Tract: No adrenal nodule. No hydronephrosis. Lobular renal contours with symmetric perinephric edema. Only minimal excretion on delayed phase imaging. Urinary bladder is physiologically distended. There is a right posterior bladder diverticulum currently measuring 5 cm. Stomach/Bowel: Pericolonic inflammation adjacent to the distal descending colon. There is a large irregular extraluminal collection containing air, small amount of fluid and probable stool with surrounding soft tissue stranding suspicious for pericolonic abscess. The borders are ill-defined, however measures approximately 5.7 x 6.1 x 5.9 cm. Communication with this collection in the descending colon is suspected, best demonstrated on coronal reformat image 60. There is associated colonic wall thickening proximal and distal to the extra colonic collection. No significant diverticular disease is seen elsewhere. Moderate stool in the more proximal colon with liquid stool in the cecum and ascending colon. Normal appendix tentatively identified. There is no small bowel dilatation or inflammation. Small hiatal hernia, stomach is decompressed. Vascular/Lymphatic: No definite pericolonic adenopathy, degree of inflammation partial limits assessment. Normal caliber abdominal aorta with mild  atherosclerosis. No pelvic adenopathy. Reproductive: Normal sized prostate gland. Other: Inflammatory change in the left lower abdomen is soft tissue stranding and trace fluid, no ascites elsewhere. There is no tracking free air. Musculoskeletal: There are no acute or suspicious osseous abnormalities. Multilevel degenerative change in the lumbar spine, degenerative change in both hips. IMPRESSION: 1. Large inflammatory pericolonic ill-defined heterogeneous collection arising from the distal descending colon containing air, fluid and possible stool. Colonic wall thickening just proximal distal. Differential considerations include diverticulitis with abscess versus perforated colonic malignancy. No significant diverticular disease is seen elsewhere in the colon, which increases concern for malignancy. 2. Perinephric edema and diminished renal excretion on delayed phase imaging suggesting underlying renal disease. 3. Hepatic granuloma.  No focal hepatic mass. 4.  Aortic Atherosclerosis (ICD10-I70.0). These results were called by telephone at the time of interpretation on 09/15/2017 at 11:25 pm to Dr. Thomasene Lot , who verbally acknowledged these results. Electronically Signed   By: Jeb Levering M.D.   On: 09/15/2017 23:26   Ct Image Guided Drainage By Percutaneous Catheter  Result Date: 09/16/2017 INDICATION: 82 year old male with a history of diverticular abscess EXAM: CT GUIDED DRAINAGE OF ABDOMINAL ABSCESS MEDICATIONS: The patient is currently admitted to the hospital and receiving intravenous antibiotics. The antibiotics were administered within an appropriate time frame prior to the initiation of the procedure. ANESTHESIA/SEDATION: 1.0 mg IV Versed 50 mcg IV Fentanyl Moderate Sedation Time:  10 minutes The patient was continuously monitored during the procedure by the interventional radiology nurse under my direct supervision. COMPLICATIONS: None TECHNIQUE: Informed written consent was obtained from the patient  after a thorough discussion of the procedural risks, benefits and alternatives. All questions were addressed. Maximal Sterile Barrier Technique was utilized including caps, mask, sterile gowns, sterile gloves, sterile drape, hand hygiene and skin antiseptic. A timeout was performed prior to the initiation of the procedure. PROCEDURE: The left lower abdomen was prepped with chlorhexidine in a sterile fashion, and a sterile drape was applied covering the operative field. A sterile gown and sterile gloves were used for the procedure. Local anesthesia was provided with 1% Lidocaine. After a scout CT, the patient is prepped and draped in the usual sterile fashion. 1% lidocaine was used for local anesthesia. A small stab incision was made, and using CT guidance, trocar needle was advanced into the fluid and gas collection in the left abdomen. Once we confirmed needle tip position, modified Seldinger technique was used to place a 12 [place] drain into the collection. Frankly purulent material was aspirated. Pigtail drain was sutured in position and attached to gravity drainage. Approximately 50 cc of fluid removed. Sample was sent to the lab for analysis. Patient tolerated the procedure well and remained hemodynamically stable throughout. No complications were encountered and no significant blood loss. FINDINGS: Fluid and gas collection the left lower quadrant adjacent to the sigmoid colon similar to the comparison CT. Final image demonstrates placement of pigtail catheter into the collection. Approximately 50 cc of frankly purulent material aspirated. IMPRESSION: Status post CT-guided drainage of left lower quadrant diverticular abscess. Signed, Dulcy Fanny. Earleen Newport, DO [facility] Radiology Electronically Signed   By: Corrie Mckusick D.O.   On: 09/16/2017 17:27    IR guided percutaneous drainage on 09/16/2017   Subjective: Patient seen and examined at bedside. He feels much  better. He is tolerating diet. He wants to go home. No overnight fever or vomiting.  Discharge Exam: Vitals:   09/20/17 2111 09/21/17 0537  BP: (!) 144/83 (!) 154/71  Pulse: 81 80  Resp: 18 18  Temp: 98.2 F (36.8 C) 97.6 F (36.4 C)  SpO2: 100% 100%   Vitals:   09/20/17 1553 09/20/17 2111 09/21/17 0500 09/21/17 0537  BP: 107/89 (!) 144/83  (!) 154/71  Pulse: 82 81  80  Resp: 19 18  18   Temp: 98.1 F (36.7 C) 98.2 F (36.8 C)  97.6 F (36.4 C)  TempSrc: Oral Oral  Oral  SpO2: 100% 100%  100%  Weight:   94.6 kg (208 lb 9.6 oz)   Height:        General: Pt is alert, awake, not in acute distress Cardiovascular: rate controlled, S1/S2 + Respiratory: bilateral decreased breath sounds at bases Abdominal: Soft, NT, ND, bowel sounds +; left-sided dressing with left lower quadrant drain present Extremities: no edema, no cyanosis    The results of significant diagnostics from this hospitalization (including imaging, microbiology, ancillary and laboratory) are listed below for reference.     Microbiology: Recent Results (from the past 240 hour(s))  Blood culture (routine x 2)     Status: None (Preliminary result)   Collection Time: 09/15/17 11:40 PM  Result Value Ref Range Status   Specimen Description BLOOD RIGHT HAND  Final   Special Requests IN PEDIATRIC BOTTLE Blood Culture adequate volume  Final   Culture NO GROWTH 4 DAYS  Final   Report Status PENDING  Incomplete  Blood culture (routine x 2)     Status: None (Preliminary result)   Collection Time: 09/15/17 11:45 PM  Result Value Ref Range Status  Specimen Description BLOOD LEFT ANTECUBITAL  Final   Special Requests   Final    BOTTLES DRAWN AEROBIC AND ANAEROBIC Blood Culture adequate volume   Culture NO GROWTH 4 DAYS  Final   Report Status PENDING  Incomplete  Aerobic/Anaerobic Culture (surgical/deep wound)     Status: Abnormal   Collection Time: 09/16/17  5:16 PM  Result Value Ref Range Status   Specimen  Description ABSCESS  Final   Special Requests COLONIC DIVERTICULAR  Final   Gram Stain   Final    ABUNDANT WBC PRESENT, PREDOMINANTLY PMN ABUNDANT GRAM NEGATIVE RODS ABUNDANT GRAM POSITIVE RODS FEW GRAM POSITIVE COCCI IN PAIRS IN CLUSTERS    Culture (A)  Final    MULTIPLE ORGANISMS PRESENT, NONE PREDOMINANT MIXED ANAEROBIC FLORA PRESENT.  CALL LAB IF FURTHER IID REQUIRED.    Report Status 09/18/2017 FINAL  Final     Labs: BNP (last 3 results) No results for input(s): BNP in the last 8760 hours. Basic Metabolic Panel:  Recent Labs Lab 09/17/17 0500 09/18/17 0422 09/19/17 0332 09/20/17 0440 09/21/17 0400  NA 137 136 136 138 137  K 3.9 3.6 3.3* 3.8 3.5  CL 106 104 105 108 103  CO2 25 27 28 26 28   GLUCOSE 87 96 96 93 98  BUN 8 6 <5* <5* <5*  CREATININE 0.86 0.78 0.75 0.80 0.81  CALCIUM 7.9* 7.6* 7.8* 7.8* 7.8*  MG 1.8 1.8 1.6* 1.8 1.7   Liver Function Tests:  Recent Labs Lab 09/15/17 1801 09/17/17 0500  AST 18 19  ALT 13* 10*  ALKPHOS 86 61  BILITOT 0.3 0.6  PROT 7.3 5.7*  ALBUMIN 2.5* 1.9*   No results for input(s): LIPASE, AMYLASE in the last 168 hours. No results for input(s): AMMONIA in the last 168 hours. CBC:  Recent Labs Lab 09/16/17 0509 09/17/17 0500 09/18/17 0422 09/19/17 0332 09/20/17 0440 09/21/17 0400  WBC 19.6* 12.2* 6.2 5.7 4.5 4.4  NEUTROABS 17.4* 10.0*  --  3.7 2.8 2.7  HGB 8.2* 8.3* 8.1* 8.6* 8.2* 8.1*  HCT 26.3* 27.1* 26.0* 28.4* 27.0* 26.8*  MCV 73.5* 74.2* 73.9* 75.3* 74.6* 75.1*  PLT 523* 468* 538* 536* 502* 478*   Cardiac Enzymes: No results for input(s): CKTOTAL, CKMB, CKMBINDEX, TROPONINI in the last 168 hours. BNP: Invalid input(s): POCBNP CBG: No results for input(s): GLUCAP in the last 168 hours. D-Dimer No results for input(s): DDIMER in the last 72 hours. Hgb A1c No results for input(s): HGBA1C in the last 72 hours. Lipid Profile No results for input(s): CHOL, HDL, LDLCALC, TRIG, CHOLHDL, LDLDIRECT in the last  72 hours. Thyroid function studies No results for input(s): TSH, T4TOTAL, T3FREE, THYROIDAB in the last 72 hours.  Invalid input(s): FREET3 Anemia work up No results for input(s): VITAMINB12, FOLATE, FERRITIN, TIBC, IRON, RETICCTPCT in the last 72 hours. Urinalysis    Component Value Date/Time   COLORURINE YELLOW 09/15/2017 0327   APPEARANCEUR HAZY (A) 09/15/2017 0327   LABSPEC >1.046 (H) 09/15/2017 0327   PHURINE 5.0 09/15/2017 0327   GLUCOSEU NEGATIVE 09/15/2017 0327   HGBUR NEGATIVE 09/15/2017 0327   BILIRUBINUR NEGATIVE 09/15/2017 0327   KETONESUR 5 (A) 09/15/2017 0327   PROTEINUR NEGATIVE 09/15/2017 0327   NITRITE NEGATIVE 09/15/2017 0327   LEUKOCYTESUR LARGE (A) 09/15/2017 0327   Sepsis Labs Invalid input(s): PROCALCITONIN,  WBC,  LACTICIDVEN Microbiology Recent Results (from the past 240 hour(s))  Blood culture (routine x 2)     Status: None (Preliminary result)   Collection Time:  09/15/17 11:40 PM  Result Value Ref Range Status   Specimen Description BLOOD RIGHT HAND  Final   Special Requests IN PEDIATRIC BOTTLE Blood Culture adequate volume  Final   Culture NO GROWTH 4 DAYS  Final   Report Status PENDING  Incomplete  Blood culture (routine x 2)     Status: None (Preliminary result)   Collection Time: 09/15/17 11:45 PM  Result Value Ref Range Status   Specimen Description BLOOD LEFT ANTECUBITAL  Final   Special Requests   Final    BOTTLES DRAWN AEROBIC AND ANAEROBIC Blood Culture adequate volume   Culture NO GROWTH 4 DAYS  Final   Report Status PENDING  Incomplete  Aerobic/Anaerobic Culture (surgical/deep wound)     Status: Abnormal   Collection Time: 09/16/17  5:16 PM  Result Value Ref Range Status   Specimen Description ABSCESS  Final   Special Requests COLONIC DIVERTICULAR  Final   Gram Stain   Final    ABUNDANT WBC PRESENT, PREDOMINANTLY PMN ABUNDANT GRAM NEGATIVE RODS ABUNDANT GRAM POSITIVE RODS FEW GRAM POSITIVE COCCI IN PAIRS IN CLUSTERS    Culture  (A)  Final    MULTIPLE ORGANISMS PRESENT, NONE PREDOMINANT MIXED ANAEROBIC FLORA PRESENT.  CALL LAB IF FURTHER IID REQUIRED.    Report Status 09/18/2017 FINAL  Final     Time coordinating discharge: 35 minutes  SIGNED:   Aline August, MD  [facility] 09/21/2017, 10:55 AM Pager: [phone]  If 7PM-7AM, please contact night-coverage [email or web address] Password [email or web address]

## 2017-09-21 NOTE — Progress Notes (Signed)
Referring Physician(s): Dr Leane Para  Supervising Physician: Daryll Brod  Patient Status:  Roberto Owen - In-pt  Chief Complaint:  Diverticulitis abscess  Subjective:  Drain placed 9/27 OP minimal Wbc wnl Afeb On Augmentin CT yesterday: IMPRESSION: 1. Interval placement of left lower quadrant percutaneous drainage catheter with significant decrease in size of previously noted irregular gas and fluid collection adjacent to the sigmoid colon. No significant residual abscess collection at this time. Moderate residual inflammatory process in the left lower quadrant with residual colon wall thickening observed. Colonoscopy follow-up after resolution of acute episode should be considered. 2. Coarse calcification at the base of the heart, likely pericardial, suggesting prior pericarditis 3. Enlarged prostate with mass effect on posterior bladder. Moderate right posterior bladder diverticulum containing layering density.  Allergies: Patient has no known allergies.  Medications: Prior to Admission medications   Medication Sig Start Date End Date Taking? Authorizing Provider  ferrous sulfate 325 (65 FE) MG tablet Take 1 tablet (325 mg total) by mouth daily with breakfast. 07/27/17  Yes Tawny Asal, MD  Multiple Vitamins-Minerals (ADULT ONE DAILY GUMMIES) CHEW Chew 1 tablet by mouth daily.    Yes [provider]  pantoprazole (PROTONIX) 40 MG tablet TAKE 1 TABLET BY MOUTH TWICE DAILY 09/10/17  Yes Lorella Nimrod, MD     Vital Signs: BP (!) 154/71 (BP Location: Left Arm)   Pulse 80   Temp 97.6 F (36.4 C) (Oral)   Resp 18   Ht 6\' 6"  (1.981 m)   Wt 208 lb 9.6 oz (94.6 kg)   SpO2 100%   BMI 24.11 kg/m   Physical Exam  Constitutional: He is oriented to person, place, and time.  Abdominal: Soft.  Musculoskeletal: Normal range of motion.  Neurological: He is alert and oriented to person, place, and time.  Skin: Skin is warm and dry.  Site clean and dry OP minimal  in bag Scant milky brown OP + Mult organisms   Nursing note and vitals reviewed.   Imaging: Ct Abdomen Pelvis W Contrast  Result Date: 09/20/2017 CLINICAL DATA:  Abdominal pain with fever EXAM: CT ABDOMEN AND PELVIS WITH CONTRAST TECHNIQUE: Multidetector CT imaging of the abdomen and pelvis was performed using the standard protocol following bolus administration of intravenous contrast. CONTRAST:  129mL ISOVUE-300 IOPAMIDOL (ISOVUE-300) INJECTION 61% COMPARISON:  09/16/2017, 09/15/2017 FINDINGS: Lower chest: Lung bases demonstrate no acute consolidation or pleural effusion. Normal heart size. Coarse pericardial calcification at the base of the heart. Hepatobiliary: Calcified granuloma. No calcified gallstones or biliary dilatation Pancreas: Unremarkable. No pancreatic ductal dilatation or surrounding inflammatory changes. Spleen: Normal in size without focal abnormality. Adrenals/Urinary Tract: Adrenal glands are within normal limits. Early excretion of contrast into the collecting systems. No hydronephrosis. Right posterior bladder diverticulum Stomach/Bowel: Moderate debris in the stomach. No dilated small bowel. Moderate stool in the colon. Residual wall thickening of the distal descending/ sigmoid colon. Diverticula are present. Normal appendix Vascular/Lymphatic: Aortic atherosclerosis. Small retroperitoneal lymph nodes. Reproductive: Enlarged prostate with mass effect on the posterior bladder Other: Interim placement of left lower quadrant percutaneous drain with substantial decrease in size of previously noted heterogenous gas and fluid collection adjacent to the colon. Moderate residual inflammatory change. No significant residual abscess fluid at this time. Musculoskeletal: Degenerative changes. No acute or suspicious lesion. IMPRESSION: 1. Interval placement of left lower quadrant percutaneous drainage catheter with significant decrease in size of previously noted irregular gas and fluid  collection adjacent to the sigmoid colon. No significant residual abscess collection  at this time. Moderate residual inflammatory process in the left lower quadrant with residual colon wall thickening observed. Colonoscopy follow-up after resolution of acute episode should be considered. 2. Coarse calcification at the base of the heart, likely pericardial, suggesting prior pericarditis 3. Enlarged prostate with mass effect on posterior bladder. Moderate right posterior bladder diverticulum containing layering density. Electronically Signed   By: Donavan Foil M.D.   On: 09/20/2017 22:01    Labs:  CBC:  Recent Labs  09/18/17 0422 09/19/17 0332 09/20/17 0440 09/21/17 0400  WBC 6.2 5.7 4.5 4.4  HGB 8.1* 8.6* 8.2* 8.1*  HCT 26.0* 28.4* 27.0* 26.8*  PLT 538* 536* 502* 478*    COAGS:  Recent Labs  07/23/17 1328 09/16/17 0736  INR 1.09 1.18  APTT 27  --     BMP:  Recent Labs  09/18/17 0422 09/19/17 0332 09/20/17 0440 09/21/17 0400  NA 136 136 138 137  K 3.6 3.3* 3.8 3.5  CL 104 105 108 103  CO2 27 28 26 28   GLUCOSE 96 96 93 98  BUN 6 <5* <5* <5*  CALCIUM 7.6* 7.8* 7.8* 7.8*  CREATININE 0.78 0.75 0.80 0.81  GFRNONAA >60 >60 >60 >60  GFRAA >60 >60 >60 >60    LIVER FUNCTION TESTS:  Recent Labs  07/23/17 1356 09/15/17 1801 09/17/17 0500  BILITOT 0.6 0.3 0.6  AST 19 18 19   ALT 8* 13* 10*  ALKPHOS 64 86 61  PROT 7.2 7.3 5.7*  ALBUMIN 3.0* 2.5* 1.9*    Assessment and Plan:  For DC with drain per [facility] Need flushed at home 5 cc sterile saline daily IR OP [facility] will call pt with time and date for 1 week follow up Orders in place Pt is aware and agreeable  Electronically Signed: Ram Haugan A, PA-C 09/21/2017, 8:56 AM   I spent a total of 15 Minutes at the the patient's bedside AND on the patient's hospital floor or unit, greater than 50% of which was counseling/coordinating care for abscess drain

## 2017-09-21 NOTE — Progress Notes (Signed)
Pt discharged assisted by Nurse tech via wheelchair.

## 2017-09-21 NOTE — Progress Notes (Signed)
Pt for discharge today going home, health teachings, next appointment, personal belongings given, removed the peripheral IV line, given prescriptions for tramadol, no complain of pain, waiting for his daughter to pick him up.

## 2017-09-21 NOTE — Progress Notes (Signed)
Patient ID: Roberto Owen, male   DOB: Oct 07, 1935, 82 y.o.   MRN: [id number]  [facility] Surgery Progress Note     Subjective: CC- diverticulitis No complaints this morning. Denies abdominal pain. Denies n/v. Tolerating diet. Had a normal BM this morning.  Objective: Vital signs in last 24 hours: Temp:  [97.6 F (36.4 C)-98.2 F (36.8 C)] 97.6 F (36.4 C) (10/02 0537) Pulse Rate:  [80-82] 80 (10/02 0537) Resp:  [18-19] 18 (10/02 0537) BP: (107-154)/(71-89) 154/71 (10/02 0537) SpO2:  [100 %] 100 % (10/02 0537) Weight:  [208 lb 9.6 oz (94.6 kg)] 208 lb 9.6 oz (94.6 kg) (10/02 0500) Last BM Date: 09/20/17  Intake/Output from previous day: 10/01 0701 - 10/02 0700 In: 1000 [P.O.:990] Out: 500 [Urine:475; Drains:25] Intake/Output this shift: No intake/output data recorded.  PE: Gen:  Alert, NAD, pleasant HEENT: EOM's intact, pupils equal and round Pulm:  CTAB, no W/R/R, effort normal Abd: Soft, ND, +BS, drain with no fluid in bag, no abdominal tenderness Psych: A&Ox3  Skin: no rashes noted, warm and dry  Lab Results:   Recent Labs  09/20/17 0440 09/21/17 0400  WBC 4.5 4.4  HGB 8.2* 8.1*  HCT 27.0* 26.8*  PLT 502* 478*   BMET  Recent Labs  09/20/17 0440 09/21/17 0400  NA 138 137  K 3.8 3.5  CL 108 103  CO2 26 28  GLUCOSE 93 98  BUN <5* <5*  CREATININE 0.80 0.81  CALCIUM 7.8* 7.8*   PT/INR No results for input(s): LABPROT, INR in the last 72 hours. CMP     Component Value Date/Time   NA 137 09/21/2017 0400   K 3.5 09/21/2017 0400   CL 103 09/21/2017 0400   CO2 28 09/21/2017 0400   GLUCOSE 98 09/21/2017 0400   BUN <5 (L) 09/21/2017 0400   CREATININE 0.81 09/21/2017 0400   CALCIUM 7.8 (L) 09/21/2017 0400   PROT 5.7 (L) 09/17/2017 0500   ALBUMIN 1.9 (L) 09/17/2017 0500   AST 19 09/17/2017 0500   ALT 10 (L) 09/17/2017 0500   ALKPHOS 61 09/17/2017 0500   BILITOT 0.6 09/17/2017 0500   GFRNONAA >60 09/21/2017 0400   GFRAA >60 09/21/2017 0400    Lipase     Component Value Date/Time   LIPASE 22 07/23/2017 1356       Studies/Results: Ct Abdomen Pelvis W Contrast  Result Date: 09/20/2017 CLINICAL DATA:  Abdominal pain with fever EXAM: CT ABDOMEN AND PELVIS WITH CONTRAST TECHNIQUE: Multidetector CT imaging of the abdomen and pelvis was performed using the standard protocol following bolus administration of intravenous contrast. CONTRAST:  145mL ISOVUE-300 IOPAMIDOL (ISOVUE-300) INJECTION 61% COMPARISON:  09/16/2017, 09/15/2017 FINDINGS: Lower chest: Lung bases demonstrate no acute consolidation or pleural effusion. Normal heart size. Coarse pericardial calcification at the base of the heart. Hepatobiliary: Calcified granuloma. No calcified gallstones or biliary dilatation Pancreas: Unremarkable. No pancreatic ductal dilatation or surrounding inflammatory changes. Spleen: Normal in size without focal abnormality. Adrenals/Urinary Tract: Adrenal glands are within normal limits. Early excretion of contrast into the collecting systems. No hydronephrosis. Right posterior bladder diverticulum Stomach/Bowel: Moderate debris in the stomach. No dilated small bowel. Moderate stool in the colon. Residual wall thickening of the distal descending/ sigmoid colon. Diverticula are present. Normal appendix Vascular/Lymphatic: Aortic atherosclerosis. Small retroperitoneal lymph nodes. Reproductive: Enlarged prostate with mass effect on the posterior bladder Other: Interim placement of left lower quadrant percutaneous drain with substantial decrease in size of previously noted heterogenous gas and fluid collection adjacent to the colon.  Moderate residual inflammatory change. No significant residual abscess fluid at this time. Musculoskeletal: Degenerative changes. No acute or suspicious lesion. IMPRESSION: 1. Interval placement of left lower quadrant percutaneous drainage catheter with significant decrease in size of previously noted irregular gas and fluid  collection adjacent to the sigmoid colon. No significant residual abscess collection at this time. Moderate residual inflammatory process in the left lower quadrant with residual colon wall thickening observed. Colonoscopy follow-up after resolution of acute episode should be considered. 2. Coarse calcification at the base of the heart, likely pericardial, suggesting prior pericarditis 3. Enlarged prostate with mass effect on posterior bladder. Moderate right posterior bladder diverticulum containing layering density. Electronically Signed   By: Donavan Foil M.D.   On: 09/20/2017 22:01    Anti-infectives: Anti-infectives    Start     Dose/Rate Route Frequency Ordered Stop   09/20/17 1000  amoxicillin-clavulanate (AUGMENTIN) 875-125 MG per tablet 1 tablet     1 tablet Oral Every 12 hours 09/20/17 0937     09/16/17 0600  piperacillin-tazobactam (ZOSYN) IVPB 3.375 g  Status:  Discontinued     3.375 g 12.5 mL/hr over 240 Minutes Intravenous Every 8 hours 09/16/17 0215 09/20/17 0937   09/15/17 2345  ciprofloxacin (CIPRO) IVPB 400 mg     400 mg 200 mL/hr over 60 Minutes Intravenous  Once 09/15/17 2336 09/16/17 0150   09/15/17 2345  metroNIDAZOLE (FLAGYL) IVPB 500 mg     500 mg 100 mL/hr over 60 Minutes Intravenous  Once 09/15/17 2336 09/16/17 0150       Assessment/Plan Recent EGD PUD - protonix BID Recent blood transfusion for anemia Normal colonoscopy 1 year ago in [place] by history. Anemia-suspect GI blood loss. Hg 8.1, stable AKI - resolved  Acute diverticulitis with abscess. Less likely perforated cancer - s/p IR drainage 9/27 - culture growing MULTIPLE ORGANISMS PRESENT, NONE PREDOMINANT  MIXED ANAEROBIC FLORA PRESENT - drain with 25cc/24hr  - CT yesterday showed decrease in size of abscess adjacent to the sigmoid colon, moderate residual inflammatory process in the left lower quadrant with residual colon wall thickening  FEN: Soft diet ID: Cipro-Flagyl 9/26; Zosyn  09/16/17>>10/1. Augmentin 10/1>>day#2 DVT: SCD; ok for anticoagulants from our standpoint for DVT prophylaxis  Plan: Patient is ready for discharge from our standpoint. He should go home on augmentin and follow up in drain clinic and with Dr. Dalbert Batman (appointment in [place]).    LOS: 5 days    [place] , [facility] 09/21/2017, 8:02 AM Pager: [phone] Consults: [phone] Mon-Fri 7:00 am-4:30 pm Sat-Sun 7:00 am-11:30 am

## 2017-09-21 NOTE — Care Management Important Message (Signed)
Important Message  Patient Details  Name: Roberto Owen MRN: [id number] Date of Birth: [birth year]   Medicare Important Message Given:  Yes    [name] 09/21/2017, 8:38 AM

## 2017-09-22 ENCOUNTER — Other Ambulatory Visit: Payer: Self-pay | Admitting: General Surgery

## 2017-09-22 DIAGNOSIS — K5720 Diverticulitis of large intestine with perforation and abscess without bleeding: Secondary | ICD-10-CM

## 2017-09-23 ENCOUNTER — Other Ambulatory Visit: Payer: Self-pay | Admitting: Radiology

## 2017-09-23 DIAGNOSIS — K5720 Diverticulitis of large intestine with perforation and abscess without bleeding: Secondary | ICD-10-CM

## 2017-09-23 MED ORDER — SODIUM CHLORIDE FLUSH 0.9 % IV SOLN: 10.0000 mL | Freq: Every day | INTRAVENOUS | 0 refills | Status: AC

## 2017-09-23 MED FILL — NORMAL SALINE FLUSH SYRINGE: 0.9 | 8 days supply | Qty: 80 | Fill #0

## 2017-09-30 ENCOUNTER — Other Ambulatory Visit: Payer: Self-pay | Admitting: General Surgery

## 2017-09-30 ENCOUNTER — Ambulatory Visit
Admission: RE | Admit: 2017-09-30 | Discharge: 2017-09-30 | Disposition: A | Discharge status: Home / Self Care | Payer: Medicare Other | Source: Ambulatory Visit | Attending: General Surgery | Admitting: General Surgery

## 2017-09-30 ENCOUNTER — Ambulatory Visit
Admission: RE | Admit: 2017-09-30 | Discharge: 2017-09-30 | Disposition: A | Discharge status: Home / Self Care | Payer: Medicare Other | Source: Ambulatory Visit | Attending: Radiology | Admitting: Radiology

## 2017-09-30 DIAGNOSIS — K5720 Diverticulitis of large intestine with perforation and abscess without bleeding: Secondary | ICD-10-CM

## 2017-09-30 DIAGNOSIS — K5781 Diverticulitis of intestine, part unspecified, with perforation and abscess with bleeding: Secondary | ICD-10-CM | POA: Diagnosis not present

## 2017-09-30 DIAGNOSIS — K5792 Diverticulitis of intestine, part unspecified, without perforation or abscess without bleeding: Secondary | ICD-10-CM | POA: Diagnosis not present

## 2017-09-30 DIAGNOSIS — K632 Fistula of intestine: Secondary | ICD-10-CM | POA: Diagnosis present

## 2017-09-30 DIAGNOSIS — Z438 Encounter for attention to other artificial openings: Secondary | ICD-10-CM | POA: Diagnosis not present

## 2017-09-30 DIAGNOSIS — K651 Peritoneal abscess: Secondary | ICD-10-CM | POA: Diagnosis not present

## 2017-09-30 HISTORY — PX: IR RADIOLOGIST EVAL & MGMT: IMG5224

## 2017-09-30 MED ORDER — IOPAMIDOL (ISOVUE-300) INJECTION 61%
100.0000 mL | Freq: Once | INTRAVENOUS | Status: AC | PRN
Start: 2017-09-30 — End: 2017-09-30
  Administered 2017-09-30: 100 mL via INTRAVENOUS

## 2017-09-30 NOTE — Progress Notes (Signed)
Referring Physician(s): Ingram,H  Chief Complaint: The patient is seen in follow up today s/p CT-guided drainage of a left lower quadrant diverticular abscess on 09/16/17  History of present illness: Mr. Roberto Owen is an 82 year old male who is status post CT-guided drainage of a left lower quadrant diverticular abscess on 09/16/17. He presents today for follow-up CT scan and drain assessment. Since discharge from the hospital he has done remarkably well. His only complaint is some intermittent mild left lower quadrant discomfort. He currently denies fever, chills, headache, respiratory difficulties, nausea, vomiting or bleeding. He is eating without difficulty and ambulates a great deal. Drain fluid cultures yielded multiple organisms.He has completed OP Augmentin therapy. He irrigates his drain twice daily. Output has been around 5-10 mL per day for the last several days.   Past Medical History:  Diagnosis Date  . Anemia   . Esophageal ulcer    hx/notes 09/15/2017  . Gastric ulcer    hx/notes 09/15/2017  . GI bleed   . History of blood transfusion 07/2017  . NSAID induced gastritis     Past Surgical History:  Procedure Laterality Date  . COLONOSCOPY  ~ 05/2016  . ESOPHAGOGASTRODUODENOSCOPY (EGD) WITH PROPOFOL N/A 07/25/2017   Procedure: ESOPHAGOGASTRODUODENOSCOPY (EGD) WITH PROPOFOL;  Surgeon: Mauri Pole, MD;  Location: [facility];  Service: Endoscopy;  Laterality: N/A;    Allergies: Patient has no known allergies.  Medications: Prior to Admission medications   Medication Sig Start Date End Date Taking? Authorizing Provider  amoxicillin-clavulanate (AUGMENTIN) 875-125 MG tablet Take 1 tablet by mouth every 12 (twelve) hours. 09/21/17 10/01/17  Aline August, MD  docusate sodium (COLACE) 100 MG capsule Take 1 capsule (100 mg total) by mouth 2 (two) times daily. 09/21/17 10/01/17  Aline August, MD  ferrous sulfate 325 (65 FE) MG tablet Take 1 tablet (325 mg total) by mouth  daily with breakfast. 07/27/17   Tawny Asal, MD  Multiple Vitamins-Minerals (ADULT ONE DAILY GUMMIES) CHEW Chew 1 tablet by mouth daily.     [provider]  pantoprazole (PROTONIX) 40 MG tablet TAKE 1 TABLET BY MOUTH TWICE DAILY 09/10/17   Lorella Nimrod, MD  sodium chloride flush 0.9 % SOLN injection Place 10 mLs into feeding tube daily. Use daily as directed to flush abscess drainage catheter. 09/23/17 09/30/17  Corrie Mckusick, DO  traMADol (ULTRAM) 50 MG tablet Take 1 tablet (50 mg total) by mouth every 6 (six) hours as needed for moderate pain or severe pain. 09/21/17   Aline August, MD     Family History  Problem Relation Age of Onset  . Arthritis Mother   . Arthritis Father   . Heart disease Neg Hx   . Kidney disease Neg Hx   . Diabetes Neg Hx     Social History   Social History  . Marital status: Divorced    Spouse name: N/A  . Number of children: N/A  . Years of education: N/A   Social History Main Topics  . Smoking status: Never Smoker  . Smokeless tobacco: Never Used  . Alcohol use No  . Drug use: No  . Sexual activity: Not on file   Other Topics Concern  . Not on file   Social History Narrative  . No narrative on file     Vital Signs: There were no vitals taken for this visit.  Physical Exam awake/alert; LLQ drain intact, insertion site ok, minimal tenderness, abd soft,ND; 5-10 cc turbid, beige fluid in bag  Imaging: No results  found.  Labs:  CBC:  Recent Labs  09/18/17 0422 09/19/17 0332 09/20/17 0440 09/21/17 0400  WBC 6.2 5.7 4.5 4.4  HGB 8.1* 8.6* 8.2* 8.1*  HCT 26.0* 28.4* 27.0* 26.8*  PLT 538* 536* 502* 478*    COAGS:  Recent Labs  07/23/17 1328 09/16/17 0736  INR 1.09 1.18  APTT 27  --     BMP:  Recent Labs  09/18/17 0422 09/19/17 0332 09/20/17 0440 09/21/17 0400  NA 136 136 138 137  K 3.6 3.3* 3.8 3.5  CL 104 105 108 103  CO2 27 28 26 28   GLUCOSE 96 96 93 98  BUN 6 <5* <5* <5*  CALCIUM 7.6* 7.8* 7.8*  7.8*  CREATININE 0.78 0.75 0.80 0.81  GFRNONAA >60 >60 >60 >60  GFRAA >60 >60 >60 >60    LIVER FUNCTION TESTS:  Recent Labs  07/23/17 1356 09/15/17 1801 09/17/17 0500  BILITOT 0.6 0.3 0.6  AST 19 18 19   ALT 8* 13* 10*  ALKPHOS 64 86 61  PROT 7.2 7.3 5.7*  ALBUMIN 3.0* 2.5* 1.9*    Assessment: Patient with history of CT-guided drainage of a left lower quadrant diverticular abscess on 09/16/17. Patient has done well since discharge. He reports no fevers. He has completed outpatient antibiotic therapy. Drain output has been minimal. Follow-up CT today shows near resolution of previous left lower quadrant abscess. No new abscesses. 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 Patient is scheduled to follow up with Dr. Dalbert Batman of [place] on 10/18. He will be scheduled for follow drain injection in 2 weeks. He is scheduled to go back home to [place] soon and may need to arrange OP f/u as well with primary MD in [place] (Dr. Candyce Churn).  Signed: D. Rowe Robert, PA-C 09/30/2017, 11:23 AM   Please refer to Dr. Margaretmary Dys attestation of this note for management and plan.      Patient ID: Roberto Owen, male   DOB: [birth year], 82 y.o.   MRN: [id number]

## 2017-10-07 DIAGNOSIS — K259 Gastric ulcer, unspecified as acute or chronic, without hemorrhage or perforation: Secondary | ICD-10-CM | POA: Diagnosis not present

## 2017-10-07 DIAGNOSIS — K5720 Diverticulitis of large intestine with perforation and abscess without bleeding: Secondary | ICD-10-CM | POA: Diagnosis not present

## 2017-10-19 ENCOUNTER — Ambulatory Visit
Admission: RE | Admit: 2017-10-19 | Discharge: 2017-10-19 | Disposition: A | Discharge status: Home / Self Care | Payer: Medicare Other | Source: Ambulatory Visit | Attending: General Surgery | Admitting: General Surgery

## 2017-10-19 DIAGNOSIS — K5720 Diverticulitis of large intestine with perforation and abscess without bleeding: Secondary | ICD-10-CM | POA: Diagnosis not present

## 2017-10-19 DIAGNOSIS — Z4659 Encounter for fitting and adjustment of other gastrointestinal appliance and device: Secondary | ICD-10-CM | POA: Diagnosis not present

## 2017-10-19 HISTORY — PX: IR RADIOLOGIST EVAL & MGMT: IMG5224

## 2017-10-19 NOTE — Progress Notes (Signed)
Chief Complaint: Follow up of diverticular abscess drain and fistula  History of Present Illness: Roberto Owen is a 82 y.o. male who is status post percutaneous drainage of a diverticular abscess on 09/16/2017. Injection of the drainage catheter on 09/30/2017 demonstrated a complex fistula to the adjacent colon at the juncture of the lower descending and proximal sigmoid colon with what appeared to be 2 separate fistulas. Since that time, the drain has been left to gravity bag drainage and is not being flushed. Output has now turned to a dark brown fluid. He does have some occasional deep pain near the drainage catheter site. He denies fever. He is having normal bowel movements. His appetite has been normal. He did follow-up with Dr. Dalbert Batman after the first drainage catheter injection and states that he has a follow-up appointment with him again in December.  Past Medical History:  Diagnosis Date  . Anemia   . Esophageal ulcer    hx/notes 09/15/2017  . Gastric ulcer    hx/notes 09/15/2017  . GI bleed   . History of blood transfusion 07/2017  . NSAID induced gastritis     Past Surgical History:  Procedure Laterality Date  . COLONOSCOPY  ~ 05/2016  . ESOPHAGOGASTRODUODENOSCOPY (EGD) WITH PROPOFOL N/A 07/25/2017   Procedure: ESOPHAGOGASTRODUODENOSCOPY (EGD) WITH PROPOFOL;  Surgeon: Mauri Pole, MD;  Location: [facility];  Service: Endoscopy;  Laterality: N/A;    Allergies: Patient has no known allergies.  Medications: Prior to Admission medications   Medication Sig Start Date End Date Taking? Authorizing Provider  ferrous sulfate 325 (65 FE) MG tablet Take 1 tablet (325 mg total) by mouth daily with breakfast. 07/27/17   Tawny Asal, MD  Multiple Vitamins-Minerals (ADULT ONE DAILY GUMMIES) CHEW Chew 1 tablet by mouth daily.     [provider]  pantoprazole (PROTONIX) 40 MG tablet TAKE 1 TABLET BY MOUTH TWICE DAILY 09/10/17   Lorella Nimrod, MD  traMADol (ULTRAM)  50 MG tablet Take 1 tablet (50 mg total) by mouth every 6 (six) hours as needed for moderate pain or severe pain. 09/21/17   Aline August, MD     Family History  Problem Relation Age of Onset  . Arthritis Mother   . Arthritis Father   . Heart disease Neg Hx   . Kidney disease Neg Hx   . Diabetes Neg Hx     Social History   Social History  . Marital status: Divorced    Spouse name: N/A  . Number of children: N/A  . Years of education: N/A   Social History Main Topics  . Smoking status: Never Smoker  . Smokeless tobacco: Never Used  . Alcohol use No  . Drug use: No  . Sexual activity: Not on file   Other Topics Concern  . Not on file   Social History Narrative  . No narrative on file    Review of Systems: A 12 point ROS discussed and pertinent positives are indicated in the HPI above.  All other systems are negative.  Review of Systems  Constitutional: Negative.   Respiratory: Negative.   Cardiovascular: Negative.   Gastrointestinal: Positive for abdominal pain. Negative for abdominal distention, anal bleeding, blood in stool, constipation, diarrhea and nausea.       Some abdominal pain near drain site.  Genitourinary: Negative.   Musculoskeletal: Negative.   Neurological: Negative.     Vital Signs: BP (!) 127/53   Pulse 79   SpO2 93%   Physical Exam  Constitutional: He is oriented to person, place, and time.  Abdominal: Soft. He exhibits no distension. There is no tenderness. There is no guarding.  Neurological: He is alert and oriented to person, place, and time.  Vitals reviewed.   Imaging: Ct Abdomen Pelvis W Contrast  Result Date: 09/30/2017 CLINICAL DATA:  Diverticulitis and status post percutaneous catheter drainage of diverticular abscess on 09/16/2017. There is very little current drainage from the abscess drain. EXAM: CT ABDOMEN AND PELVIS WITH CONTRAST TECHNIQUE: Multidetector CT imaging of the abdomen and pelvis was performed using the  standard protocol following bolus administration of intravenous contrast. CONTRAST:  12mL ISOVUE-300 IOPAMIDOL (ISOVUE-300) INJECTION 61% COMPARISON:  09/20/2017 and 09/15/2017 FINDINGS: Lower chest: No acute abnormality. Hepatobiliary: No focal liver abnormality is seen. No gallstones, gallbladder wall thickening, or biliary dilatation. Pancreas: Unremarkable. No pancreatic ductal dilatation or surrounding inflammatory changes. Spleen: Normal in size without focal abnormality. Adrenals/Urinary Tract: Adrenal glands are unremarkable. Kidneys are normal, without renal calculi, focal lesion, or hydronephrosis. Bladder shows stable right posterior diverticulum. Stomach/Bowel: No evidence of bowel obstruction, ileus or free air. Decrease in inflammation at the level of the lower descending/ proximal sigmoid colon. Residual wall thickening is present in a segment of the colon at this level. Percutaneous drainage catheter remains in place with no evidence of residual abscess around the drain. No new abscess is identified. Vascular/Lymphatic: No significant vascular findings are present. No enlarged abdominal or pelvic lymph nodes. Reproductive: Prostate is unremarkable. Other: No abdominal wall hernia or abnormality. No abdominopelvic ascites. Musculoskeletal: No acute or significant osseous findings. IMPRESSION: Resolved diverticular abscess after percutaneous catheter drainage. Inflammation has also decreased with residual inflammation remaining adjacent thickening of the lower descending/proximal sigmoid colon. A fluoroscopic contrast injection of the abscess drainage catheter was performed to assess for fistula following the CT procedure and will be dictated separately. Electronically Signed   By: Aletta Edouard M.D.   On: 09/30/2017 12:44   Ct Abdomen Pelvis W Contrast  Result Date: 09/20/2017 CLINICAL DATA:  Abdominal pain with fever EXAM: CT ABDOMEN AND PELVIS WITH CONTRAST TECHNIQUE: Multidetector CT imaging  of the abdomen and pelvis was performed using the standard protocol following bolus administration of intravenous contrast. CONTRAST:  185mL ISOVUE-300 IOPAMIDOL (ISOVUE-300) INJECTION 61% COMPARISON:  09/16/2017, 09/15/2017 FINDINGS: Lower chest: Lung bases demonstrate no acute consolidation or pleural effusion. Normal heart size. Coarse pericardial calcification at the base of the heart. Hepatobiliary: Calcified granuloma. No calcified gallstones or biliary dilatation Pancreas: Unremarkable. No pancreatic ductal dilatation or surrounding inflammatory changes. Spleen: Normal in size without focal abnormality. Adrenals/Urinary Tract: Adrenal glands are within normal limits. Early excretion of contrast into the collecting systems. No hydronephrosis. Right posterior bladder diverticulum Stomach/Bowel: Moderate debris in the stomach. No dilated small bowel. Moderate stool in the colon. Residual wall thickening of the distal descending/ sigmoid colon. Diverticula are present. Normal appendix Vascular/Lymphatic: Aortic atherosclerosis. Small retroperitoneal lymph nodes. Reproductive: Enlarged prostate with mass effect on the posterior bladder Other: Interim placement of left lower quadrant percutaneous drain with substantial decrease in size of previously noted heterogenous gas and fluid collection adjacent to the colon. Moderate residual inflammatory change. No significant residual abscess fluid at this time. Musculoskeletal: Degenerative changes. No acute or suspicious lesion. IMPRESSION: 1. Interval placement of left lower quadrant percutaneous drainage catheter with significant decrease in size of previously noted irregular gas and fluid collection adjacent to the sigmoid colon. No significant residual abscess collection at this time. Moderate residual inflammatory process in the left lower  quadrant with residual colon wall thickening observed. Colonoscopy follow-up after resolution of acute episode should be  considered. 2. Coarse calcification at the base of the heart, likely pericardial, suggesting prior pericarditis 3. Enlarged prostate with mass effect on posterior bladder. Moderate right posterior bladder diverticulum containing layering density. Electronically Signed   By: Donavan Foil M.D.   On: 09/20/2017 22:01   Dg Sinus/fist Tube Chk-non Gi  Result Date: 10/19/2017 INDICATION: Status post percutaneous drainage of diverticular abscess on 09/16/2017. Communicating fistula to the adjacent colon at the descending/ sigmoid junction demonstrated by prior drain injection on 09/30/2017. Repeat injection is now performed to assess for persistent fistula. EXAM: INJECTION OF INDWELLING ABSCESS DRAINAGE CATHETER UNDER FLUOROSCOPY CONTRAST:  20 mL Omnipaque 300 FLUOROSCOPY TIME:  1 minute and 12 seconds.  11 mGy. MEDICATIONS: None ANESTHESIA/SEDATION: None COMPLICATIONS: None immediate. PROCEDURE: After initial fluoroscopy of the drainage catheter, contrast injection was performed via the catheter. Fluoroscopic cine loop and spot images were saved. The catheter was then reconnected to a new gravity drainage bag. FINDINGS: Contrast injection shows initial opacification of a decompressed abscess cavity medial to the colon at the juncture of the lower descending and proximal sigmoid colon. There is almost immediate filling of a fistula communicating directly with the colonic lumen. There appears to be a single fistula demonstrated at this time. Injected contrast primarily reflux is superiorly into the descending colon with visible narrowing of the proximal sigmoid lumen present. IMPRESSION: Persistent fistula from the abscess cavity to adjacent colon with immediate filling of a fistula from a decompressed abscess cavity into the colonic lumen at the juncture of the lower descending and proximal sigmoid colon. There is visible luminal narrowing of the sigmoid colon below the level of the fistula with predominant reflux of  contrast superiorly into the descending colon. Electronically Signed   By: Aletta Edouard M.D.   On: 10/19/2017 11:29   Dg Sinus/fist Tube Chk-non Gi  Result Date: 09/30/2017 INDICATION: Diverticulitis with abscess at the level of the lower descending/ proximal sigmoid colon and status post percutaneous catheter drainage of diverticular abscess on 09/16/2017. EXAM: INJECTION OF INDWELLING ABSCESS DRAINAGE CATHETER UNDER FLUOROSCOPY MEDICATIONS: No medications administered. ANESTHESIA/SEDATION: No sedation administered. COMPLICATIONS: None immediate. CONTRAST:  15 mL Omnipaque 300 FLUOROSCOPY TIME:  18 seconds.  31 mGy. PROCEDURE: Initial fluoroscopy was performed of catheter position prior to injection of contrast. Contrast injection was then performed via the indwelling drainage catheter. Fluoroscopic cine loop and spot images were saved. The catheter was then reconnected to a new gravity drainage bag. FINDINGS: With initial injection of the drain, there is filling of a collapsed, small irregular abscess cavity. There then is filling of both superior and inferior fistulas at the juncture of the lower descending and proximal sigmoid colon which opacify the colonic lumen. The more superior fistula appears larger. At the level of the fistulas, there is visible narrowing of the colonic lumen. The stricture is not obstructive, however. IMPRESSION: Abscess catheter injection demonstrates communicating fistulas to the adjacent colon at the level of the lower descending/sigmoid junction. Two separate fistulas are defined with a larger superior and smaller inferior fistula bracketing a segment of narrowing of the colon. Although this may be reflective of diverticular disease, perforated neoplasm of the colon is not excluded based on this appearance. The drainage catheter will be left in place to gravity drainage. Electronically Signed   By: Aletta Edouard M.D.   On: 09/30/2017 13:04    Labs:  CBC:  Recent  Labs  09/18/17 0422 09/19/17 0332 09/20/17 0440 09/21/17 0400  WBC 6.2 5.7 4.5 4.4  HGB 8.1* 8.6* 8.2* 8.1*  HCT 26.0* 28.4* 27.0* 26.8*  PLT 538* 536* 502* 478*    COAGS:  Recent Labs  07/23/17 1328 09/16/17 0736  INR 1.09 1.18  APTT 27  --     BMP:  Recent Labs  09/18/17 0422 09/19/17 0332 09/20/17 0440 09/21/17 0400  NA 136 136 138 137  K 3.6 3.3* 3.8 3.5  CL 104 105 108 103  CO2 27 28 26 28   GLUCOSE 96 96 93 98  BUN 6 <5* <5* <5*  CALCIUM 7.6* 7.8* 7.8* 7.8*  CREATININE 0.78 0.75 0.80 0.81  GFRNONAA >60 >60 >60 >60  GFRAA >60 >60 >60 >60    LIVER FUNCTION TESTS:  Recent Labs  07/23/17 1356 09/15/17 1801 09/17/17 0500  BILITOT 0.6 0.3 0.6  AST 19 18 19   ALT 8* 13* 10*  ALKPHOS 64 86 61  PROT 7.2 7.3 5.7*  ALBUMIN 3.0* 2.5* 1.9*    Assessment and Plan:  Output from the percutaneous drain has turned more overtly feculent over the last two weeks and the patient is not flushing the catheter.  He has had some periodic pain but denies fever.  He states he is having normal bowel movements.  Injection of the drain with contrast today under fluoroscopy demonstrates a persistent fistula to the adjacent colon. Contrast preferentially refluxes superiorly into the descending colon and there is visible luminal narrowing of the proximal sigmoid colon distal to the fistula.   The drainage catheter appears appropriately positioned and does not need to be repositioned. The drain was reconnected to a gravity drainage bag. The patient states that he will be staying with his daughter in [place] rather than returning home to [place] in order to take care of his diverticulitis. I told him that we would hold off on repeating a drain injection currently unless Dr. Dalbert Batman would need a repeat injection prior to potential elective surgery. He apparently has a follow-up appointment with Dr. Dalbert Batman in December. We will check with [facility] to determine if that  follow up should be scheduled any sooner.  Electronically SignedAletta Edouard T 10/19/2017, 11:09 AM   I spent a total of 15 Minutes in face to face in clinical consultation, greater than 50% of which was counseling/coordinating care for diverticular abscess drain.

## 2017-10-20 ENCOUNTER — Encounter: Payer: Self-pay | Admitting: Radiology

## 2017-10-23 ENCOUNTER — Emergency Department (HOSPITAL_COMMUNITY)
Admission: EM | Admit: 2017-10-23 | Discharge: 2017-10-23 | Disposition: A | Discharge status: Home / Self Care | Payer: Medicare Other | Attending: Emergency Medicine | Admitting: Emergency Medicine

## 2017-10-23 ENCOUNTER — Encounter (HOSPITAL_COMMUNITY): Payer: Self-pay

## 2017-10-23 DIAGNOSIS — Z4803 Encounter for change or removal of drains: Secondary | ICD-10-CM | POA: Diagnosis present

## 2017-10-23 DIAGNOSIS — Z79899 Other long term (current) drug therapy: Secondary | ICD-10-CM | POA: Diagnosis not present

## 2017-10-23 DIAGNOSIS — Z9889 Other specified postprocedural states: Secondary | ICD-10-CM | POA: Diagnosis not present

## 2017-10-23 DIAGNOSIS — K651 Peritoneal abscess: Secondary | ICD-10-CM | POA: Diagnosis not present

## 2017-10-23 LAB — CBC WITH DIFFERENTIAL/PLATELET
BASOS ABS: 0 10*3/uL (ref 0.0–0.1)
BASOS PCT: 0 %
Eosinophils Absolute: 0.2 10*3/uL (ref 0.0–0.7)
Eosinophils Relative: 2 %
HEMATOCRIT: 30 % — AB (ref 39.0–52.0)
HEMOGLOBIN: 9.8 g/dL — AB (ref 13.0–17.0)
LYMPHS PCT: 12 %
Lymphs Abs: 1.4 10*3/uL (ref 0.7–4.0)
MCH: 24.7 pg — ABNORMAL LOW (ref 26.0–34.0)
MCHC: 32.7 g/dL (ref 30.0–36.0)
MCV: 75.6 fL — ABNORMAL LOW (ref 78.0–100.0)
MONOS PCT: 7 %
Monocytes Absolute: 0.8 10*3/uL (ref 0.1–1.0)
NEUTROS ABS: 8.9 10*3/uL — AB (ref 1.7–7.7)
Neutrophils Relative %: 79 %
Platelets: 375 10*3/uL (ref 150–400)
RBC: 3.97 MIL/uL — ABNORMAL LOW (ref 4.22–5.81)
RDW: 17.1 % — ABNORMAL HIGH (ref 11.5–15.5)
WBC: 11.3 10*3/uL — ABNORMAL HIGH (ref 4.0–10.5)

## 2017-10-23 LAB — COMPREHENSIVE METABOLIC PANEL
ALBUMIN: 2.5 g/dL — AB (ref 3.5–5.0)
ALK PHOS: 102 U/L (ref 38–126)
ALT: 9 U/L — AB (ref 17–63)
ANION GAP: 10 (ref 5–15)
AST: 23 U/L (ref 15–41)
BILIRUBIN TOTAL: 0.4 mg/dL (ref 0.3–1.2)
BUN: 13 mg/dL (ref 6–20)
CALCIUM: 8.2 mg/dL — AB (ref 8.9–10.3)
CO2: 23 mmol/L (ref 22–32)
CREATININE: 1.04 mg/dL (ref 0.61–1.24)
Chloride: 101 mmol/L (ref 101–111)
GFR calc Af Amer: >60 mL/min (ref >60)
GFR calc non Af Amer: >60 mL/min (ref >60)
GLUCOSE: 79 mg/dL (ref 65–99)
Potassium: 4.3 mmol/L (ref 3.5–5.1)
Sodium: 134 mmol/L — ABNORMAL LOW (ref 135–145)
TOTAL PROTEIN: 7.5 g/dL (ref 6.5–8.1)

## 2017-10-23 NOTE — ED Provider Notes (Signed)
[facility] EMERGENCY DEPARTMENT Provider Note  CSN: [id number] Arrival date & time: 10/23/17 1311  Chief Complaint(s) percutaneous drain problem  HPI Roberto Owen is a 82 y.o. male with a history of diverticular abscess with fistula status post percutaneous drainage in September presents to the emergency department with 1 week of dark discharge from the [facility] drain and around the [facility] drain itself.  Patient was recently evaluated for repeat imaging to assess likelihood of removal however has not followed up with interventional radiology yet.  He is currently denying any abdominal pain, fevers, chills, nausea, vomiting, change in stools.  No other associated symptoms.  He denies any alleviating or aggravating factors.  HPI   Past Medical History Past Medical History:  Diagnosis Date  . Anemia   . Esophageal ulcer    hx/notes 09/15/2017  . Gastric ulcer    hx/notes 09/15/2017  . GI bleed   . History of blood transfusion 07/2017  . NSAID induced gastritis    Patient Active Problem List   Diagnosis Date Noted  . Hyponatremia 09/16/2017  . Diverticulitis of large intestine with abscess 09/16/2017  . PUD (peptic ulcer disease) 09/16/2017  . AKI (acute kidney injury) ([place]) 09/16/2017  . Diverticulitis of intestine with abscess 09/16/2017  . Orthostasis 09/16/2017  . Diverticular disease of intestine with perforation and abscess   . Acute gastric ulcer with hemorrhage   . Multiple gastric ulcers   . Anemia due to GI blood loss   . GI bleed 07/23/2017  . Microcytic anemia 07/23/2017   Home Medication(s) Prior to Admission medications   Medication Sig Start Date End Date Taking? Authorizing Provider  acetaminophen (TYLENOL) 500 MG tablet Take 1,000 mg by mouth every 6 (six) hours as needed for mild pain.   Yes [provider]  ferrous sulfate 325 (65 FE) MG tablet Take 1 tablet (325 mg total) by mouth daily with breakfast. 07/27/17  Yes Tawny Asal, MD    Multiple Vitamins-Minerals (ADULT ONE DAILY GUMMIES) CHEW Chew 1 tablet by mouth daily.    Yes [provider]  pantoprazole (PROTONIX) 40 MG tablet TAKE 1 TABLET BY MOUTH TWICE DAILY 09/10/17  Yes Lorella Nimrod, MD  traMADol (ULTRAM) 50 MG tablet Take 1 tablet (50 mg total) by mouth every 6 (six) hours as needed for moderate pain or severe pain. Patient not taking: Reported on 10/23/2017 09/21/17   Aline August, MD                                                                                                                                    Past Surgical History Past Surgical History:  Procedure Laterality Date  . COLONOSCOPY  ~ 05/2016  . ESOPHAGOGASTRODUODENOSCOPY (EGD) WITH PROPOFOL N/A 07/25/2017   Procedure: ESOPHAGOGASTRODUODENOSCOPY (EGD) WITH PROPOFOL;  Surgeon: Mauri Pole, MD;  Location: [facility];  Service: Endoscopy;  Laterality: N/A;  . IR RADIOLOGIST EVAL & MGMT  09/30/2017   Family History Family History  Problem Relation Age of Onset  . Arthritis Mother   . Arthritis Father   . Heart disease Neg Hx   . Kidney disease Neg Hx   . Diabetes Neg Hx     Social History Social History  Substance Use Topics  . Smoking status: Never Smoker  . Smokeless tobacco: Never Used  . Alcohol use No   Allergies Patient has no known allergies.  Review of Systems Review of Systems All other systems are reviewed and are negative for acute change except as noted in the HPI  Physical Exam Vital Signs  I have reviewed the triage vital signs BP (!) 130/59 (BP Location: Right Arm)   Pulse (!) 37   Temp 98.9 F (37.2 C) (Oral)   Resp 18   SpO2 96%   Physical Exam  Constitutional: He is oriented to person, place, and time. He appears well-developed and well-nourished. No distress.  HENT:  Head: Normocephalic and atraumatic.  Nose: Nose normal.  Eyes: Pupils are equal, round, and reactive to light. Conjunctivae and EOM are normal. Right eye exhibits no  discharge. Left eye exhibits no discharge. No scleral icterus.  Neck: Normal range of motion. Neck supple.  Cardiovascular: Normal rate and regular rhythm.  Exam reveals no gallop and no friction rub.   No murmur heard. Pulmonary/Chest: Effort normal and breath sounds normal. No stridor. No respiratory distress. He has no rales.  Abdominal: Soft. He exhibits no distension. There is no tenderness. There is no rigidity, no rebound, no guarding and no CVA tenderness.    Musculoskeletal: He exhibits no edema or tenderness.  Neurological: He is alert and oriented to person, place, and time.  Skin: Skin is warm and dry. No rash noted. He is not diaphoretic. No erythema.  Psychiatric: He has a normal mood and affect.  Vitals reviewed.   ED Results and Treatments Labs (all labs ordered are listed, but only abnormal results are displayed) Labs Reviewed  CBC WITH DIFFERENTIAL/PLATELET - Abnormal; Notable for the following:       Result Value   RBC 3.97 (*)    Hemoglobin 9.8 (*)    HCT 30.0 (*)    MCV 75.6 (*)    MCH 24.7 (*)    RDW 17.1 (*)    All other components within normal limits  COMPREHENSIVE METABOLIC PANEL - Abnormal; Notable for the following:    Sodium 134 (*)    Calcium 8.2 (*)    Albumin 2.5 (*)    ALT 9 (*)    All other components within normal limits                                                                                                                         EKG  EKG Interpretation  Date/Time:  Saturday October 23 2017 13:23:47 EDT Ventricular Rate:  95 PR Interval:  162 QRS Duration: 70 QT Interval:  350 QTC Calculation: 439 R  Axis:   48 Text Interpretation:  Sinus rhythm with sinus arrhythmia with frequent and consecutive Premature ventricular complexes Abnormal ECG Otherwise no significant change Confirmed by Addison Lank [id number]) on 10/23/2017 4:11:50 PM      Radiology No results found. Pertinent labs & imaging results that were available during  my care of the patient were reviewed by me and considered in my medical decision making (see chart for details).  Medications Ordered in ED Medications - No data to display                                                                                                                                  Procedures Procedures  (including critical care time)  Medical Decision Making / ED Course I have reviewed the nursing notes for this encounter and the patient's prior records (if available in EHR or on provided paperwork).    Discussed case with Dr. Barbie Banner who is the physician on-call for interventional radiology.  He evaluated the patient's films from October 30 and recommended placement of a larger percutaneous drain.  They will follow-up with the patient and schedule close follow-up in the clinic and set him up for procedure and drain replacement.  The patient is safe for discharge with strict return precautions.   Final Clinical Impression(s) / ED Diagnoses Final diagnoses:  H/O drainage of abscess    Disposition: Discharge  Condition: Good  I have discussed the results, Dx and Tx plan with the patient and family who expressed understanding and agree(s) with the plan. Discharge instructions discussed at great length. The patient and family were given strict return precautions who verbalized understanding of the instructions. No further questions at time of discharge.    New Prescriptions   No medications on file    Follow Up: Marybelle Killings, [address] [place] [address] [phone]  Call  If you not hear from the office by Monday afternoon     This chart was dictated using voice recognition software.  Despite best efforts to proofread,  errors can occur which can change the documentation meaning.   Fatima Blank, MD 10/23/17 [phone]

## 2017-10-23 NOTE — ED Notes (Signed)
EKG ran for HR reading 37.  EKG showed SR with PVC's.  EKG HR 95.

## 2017-10-23 NOTE — ED Triage Notes (Signed)
Onset today percutaneous drain  leaking around insertion site.  Draining to leg drain. Pt ate breakfast this morning with no problems.  No fever, abd pain, or vomiting.

## 2017-10-25 ENCOUNTER — Other Ambulatory Visit (HOSPITAL_COMMUNITY): Payer: Self-pay | Admitting: Interventional Radiology

## 2017-10-25 ENCOUNTER — Telehealth: Payer: Self-pay | Admitting: Nurse Practitioner

## 2017-10-25 DIAGNOSIS — K5720 Diverticulitis of large intestine with perforation and abscess without bleeding: Secondary | ICD-10-CM

## 2017-10-25 NOTE — Telephone Encounter (Signed)
Spoke with [name]. She is coordinating care for her father. Her brother had brought the patient in to be seen in the ER. Patient has a drain and was given bandages to use for the leakage around the drain. She reports he has pain if he is up moving around. Ambulating is uncomfortable, but with advanced age, she knows he needs to be mobile as much as possible.  Reviewed the ED note. Contact information given to [facility] for Interventional Radiology. Note indicates contact from IR is planned for this afternoon. Unclear what phone number has been given to IR. She will call to be certain the correct contact information is available.

## 2017-10-28 ENCOUNTER — Ambulatory Visit (HOSPITAL_COMMUNITY)
Admission: RE | Admit: 2017-10-28 | Discharge: 2017-10-28 | Disposition: A | Discharge status: Home / Self Care | Payer: Medicare Other | Source: Ambulatory Visit | Attending: Interventional Radiology | Admitting: Interventional Radiology

## 2017-10-28 ENCOUNTER — Encounter (HOSPITAL_COMMUNITY): Payer: Self-pay | Admitting: Interventional Radiology

## 2017-10-28 DIAGNOSIS — T859XXA Unspecified complication of internal prosthetic device, implant and graft, initial encounter: Secondary | ICD-10-CM | POA: Insufficient documentation

## 2017-10-28 DIAGNOSIS — T85898A Other specified complication of other internal prosthetic devices, implants and grafts, initial encounter: Secondary | ICD-10-CM | POA: Diagnosis not present

## 2017-10-28 DIAGNOSIS — Y742 Prosthetic and other implants, materials and accessory general hospital and personal-use devices associated with adverse incidents: Secondary | ICD-10-CM | POA: Diagnosis not present

## 2017-10-28 DIAGNOSIS — K5780 Diverticulitis of intestine, part unspecified, with perforation and abscess without bleeding: Secondary | ICD-10-CM | POA: Diagnosis not present

## 2017-10-28 DIAGNOSIS — K5720 Diverticulitis of large intestine with perforation and abscess without bleeding: Secondary | ICD-10-CM

## 2017-10-28 HISTORY — PX: IR CATHETER TUBE CHANGE: IMG717

## 2017-10-28 MED ORDER — LIDOCAINE HCL 1 % IJ SOLN
INTRAMUSCULAR | Status: DC | PRN
  Administered 2017-10-28: 10 mL

## 2017-10-28 MED ORDER — LIDOCAINE HCL 1 % IJ SOLN
INTRAMUSCULAR | Status: AC
  Filled 2017-10-28: qty 20

## 2017-10-28 MED ORDER — IOPAMIDOL (ISOVUE-300) INJECTION 61%
INTRAVENOUS | Status: AC
  Administered 2017-10-28: 10 mL
  Filled 2017-10-28: qty 50

## 2017-10-28 NOTE — Procedures (Signed)
Interventional Radiology Procedure Note  Procedure:  Fluoro guided left abdominal drain exchange.  New 29F placed after confirming obstruction of the current 36F drain.   Fistula to the colon persists.    Complications: None  Recommendations:  - OK for DC home - To gravity drain - At least every other day flushes with sterile saline.  - Do not submerge - Routine drain care   Signed,  Dulcy Fanny. Earleen Newport, DO

## 2017-11-01 ENCOUNTER — Telehealth (HOSPITAL_COMMUNITY): Payer: Self-pay

## 2017-11-01 ENCOUNTER — Other Ambulatory Visit: Payer: Self-pay | Admitting: General Surgery

## 2017-11-01 DIAGNOSIS — K5720 Diverticulitis of large intestine with perforation and abscess without bleeding: Secondary | ICD-10-CM

## 2017-11-01 NOTE — Telephone Encounter (Signed)
Pt called concerning his drain. He just had his drain upsized on 11/8 and wanted to know if some leakage was normal. I spoke with the PA Ascencion Dike and he stated that this was normal since he just had the drain upsized. If the leakage continues or he starts to have pain then please call to schedule an evaluation. Informed the pt and he voiced his understanding and agreed to call back if he continues to have problems or starts to have pain. Pt also wanted to know about getting some saline flushes ordered. I called Vickie at the clinic and she will have Mallie Mussel order some at [facility] and call the pt to schedule him for his next f/u.

## 2017-11-02 ENCOUNTER — Other Ambulatory Visit: Payer: Self-pay | Admitting: Radiology

## 2017-11-02 MED ORDER — SODIUM CHLORIDE 0.9% FLUSH: 10.0000 mL | INTRAVENOUS | Status: DC

## 2017-11-02 MED FILL — NORMAL SALINE FLUSH SYRINGE: 0.9 | 12 days supply | Qty: 60 | Fill #0

## 2017-11-05 ENCOUNTER — Telehealth: Payer: Self-pay | Admitting: Nurse Practitioner

## 2017-11-05 ENCOUNTER — Other Ambulatory Visit: Payer: Self-pay

## 2017-11-05 DIAGNOSIS — K279 Peptic ulcer, site unspecified, unspecified as acute or chronic, without hemorrhage or perforation: Secondary | ICD-10-CM

## 2017-11-05 DIAGNOSIS — R066 Hiccough: Secondary | ICD-10-CM

## 2017-11-05 NOTE — Telephone Encounter (Signed)
Please contact IR and request drain check to see it is in correct position given recent change in symptoms. He had perforated diverticulitis with diverticular abscess.

## 2017-11-05 NOTE — Telephone Encounter (Signed)
Call from [facility] in [place]. Reviewed by PA Claiborne Billings. Not likely the issues are caused by the drain, but if the patient continues to have issues before his appointment on 11/09/17 he could be evaluated in the ER. Information relayed to the daughter. She calls back to report he is actually doing better.

## 2017-11-05 NOTE — Telephone Encounter (Signed)
Spoke with the patient's daughter. The patient has had procedure through IR. Has done well until 2 days ago. He developed hiccups. Describes this as "deep" hiccups that occur with very little PO intake. He will "hiccups about two or three times and then everything comes back up."

## 2017-11-05 NOTE — Telephone Encounter (Signed)
Patient is taking Pantoprazole as ordered. He is reported to be alert, afebrile and functioning at his baseline.Daughter will try Gas-X or simethicone for the hiccups. Keep the upcoming appointment.

## 2017-11-08 ENCOUNTER — Emergency Department (HOSPITAL_COMMUNITY): Payer: Medicare Other

## 2017-11-08 ENCOUNTER — Inpatient Hospital Stay (HOSPITAL_COMMUNITY)
Admission: EM | Admit: 2017-11-08 | Discharge: 2017-11-26 | DRG: 853 | Disposition: A | Discharge status: Skilled Nursing Facility | Payer: Medicare Other | Attending: Family Medicine | Admitting: Family Medicine

## 2017-11-08 ENCOUNTER — Encounter (HOSPITAL_COMMUNITY): Payer: Self-pay

## 2017-11-08 ENCOUNTER — Other Ambulatory Visit: Payer: Self-pay

## 2017-11-08 DIAGNOSIS — J189 Pneumonia, unspecified organism: Secondary | ICD-10-CM | POA: Diagnosis present

## 2017-11-08 DIAGNOSIS — C187 Malignant neoplasm of sigmoid colon: Secondary | ICD-10-CM | POA: Diagnosis not present

## 2017-11-08 DIAGNOSIS — J9600 Acute respiratory failure, unspecified whether with hypoxia or hypercapnia: Secondary | ICD-10-CM | POA: Diagnosis not present

## 2017-11-08 DIAGNOSIS — R1110 Vomiting, unspecified: Secondary | ICD-10-CM | POA: Diagnosis not present

## 2017-11-08 DIAGNOSIS — S065X9A Traumatic subdural hemorrhage with loss of consciousness of unspecified duration, initial encounter: Secondary | ICD-10-CM | POA: Diagnosis not present

## 2017-11-08 DIAGNOSIS — E871 Hypo-osmolality and hyponatremia: Secondary | ICD-10-CM | POA: Diagnosis present

## 2017-11-08 DIAGNOSIS — K219 Gastro-esophageal reflux disease without esophagitis: Secondary | ICD-10-CM | POA: Diagnosis not present

## 2017-11-08 DIAGNOSIS — D011 Carcinoma in situ of rectosigmoid junction: Secondary | ICD-10-CM | POA: Diagnosis not present

## 2017-11-08 DIAGNOSIS — R3129 Other microscopic hematuria: Secondary | ICD-10-CM | POA: Diagnosis present

## 2017-11-08 DIAGNOSIS — M6281 Muscle weakness (generalized): Secondary | ICD-10-CM | POA: Diagnosis not present

## 2017-11-08 DIAGNOSIS — D649 Anemia, unspecified: Secondary | ICD-10-CM | POA: Diagnosis not present

## 2017-11-08 DIAGNOSIS — R402434 Glasgow coma scale score 3-8, 24 hours or more after hospital admission: Secondary | ICD-10-CM | POA: Diagnosis not present

## 2017-11-08 DIAGNOSIS — I509 Heart failure, unspecified: Secondary | ICD-10-CM | POA: Diagnosis present

## 2017-11-08 DIAGNOSIS — C772 Secondary and unspecified malignant neoplasm of intra-abdominal lymph nodes: Secondary | ICD-10-CM | POA: Diagnosis not present

## 2017-11-08 DIAGNOSIS — D75839 Thrombocytosis, unspecified: Secondary | ICD-10-CM | POA: Diagnosis present

## 2017-11-08 DIAGNOSIS — E0781 Sick-euthyroid syndrome: Secondary | ICD-10-CM | POA: Diagnosis present

## 2017-11-08 DIAGNOSIS — N179 Acute kidney failure, unspecified: Secondary | ICD-10-CM | POA: Diagnosis not present

## 2017-11-08 DIAGNOSIS — D509 Iron deficiency anemia, unspecified: Secondary | ICD-10-CM | POA: Diagnosis not present

## 2017-11-08 DIAGNOSIS — I493 Ventricular premature depolarization: Secondary | ICD-10-CM | POA: Diagnosis not present

## 2017-11-08 DIAGNOSIS — C19 Malignant neoplasm of rectosigmoid junction: Secondary | ICD-10-CM | POA: Diagnosis present

## 2017-11-08 DIAGNOSIS — I10 Essential (primary) hypertension: Secondary | ICD-10-CM | POA: Diagnosis not present

## 2017-11-08 DIAGNOSIS — E162 Hypoglycemia, unspecified: Secondary | ICD-10-CM | POA: Diagnosis present

## 2017-11-08 DIAGNOSIS — S065X0A Traumatic subdural hemorrhage without loss of consciousness, initial encounter: Secondary | ICD-10-CM | POA: Diagnosis not present

## 2017-11-08 DIAGNOSIS — I480 Paroxysmal atrial fibrillation: Secondary | ICD-10-CM | POA: Diagnosis not present

## 2017-11-08 DIAGNOSIS — E870 Hyperosmolality and hypernatremia: Secondary | ICD-10-CM | POA: Diagnosis not present

## 2017-11-08 DIAGNOSIS — I130 Hypertensive heart and chronic kidney disease with heart failure and stage 1 through stage 4 chronic kidney disease, or unspecified chronic kidney disease: Secondary | ICD-10-CM | POA: Diagnosis present

## 2017-11-08 DIAGNOSIS — K668 Other specified disorders of peritoneum: Secondary | ICD-10-CM | POA: Diagnosis not present

## 2017-11-08 DIAGNOSIS — E876 Hypokalemia: Secondary | ICD-10-CM | POA: Diagnosis present

## 2017-11-08 DIAGNOSIS — R100 Acute abdomen: Secondary | ICD-10-CM | POA: Diagnosis not present

## 2017-11-08 DIAGNOSIS — G9341 Metabolic encephalopathy: Secondary | ICD-10-CM | POA: Diagnosis present

## 2017-11-08 DIAGNOSIS — I4891 Unspecified atrial fibrillation: Secondary | ICD-10-CM | POA: Diagnosis not present

## 2017-11-08 DIAGNOSIS — K5792 Diverticulitis of intestine, part unspecified, without perforation or abscess without bleeding: Secondary | ICD-10-CM | POA: Diagnosis not present

## 2017-11-08 DIAGNOSIS — A419 Sepsis, unspecified organism: Principal | ICD-10-CM | POA: Diagnosis present

## 2017-11-08 DIAGNOSIS — R109 Unspecified abdominal pain: Secondary | ICD-10-CM

## 2017-11-08 DIAGNOSIS — K5701 Diverticulitis of small intestine with perforation and abscess with bleeding: Secondary | ICD-10-CM | POA: Diagnosis not present

## 2017-11-08 DIAGNOSIS — K632 Fistula of intestine: Secondary | ICD-10-CM | POA: Diagnosis present

## 2017-11-08 DIAGNOSIS — K5780 Diverticulitis of intestine, part unspecified, with perforation and abscess without bleeding: Secondary | ICD-10-CM | POA: Diagnosis present

## 2017-11-08 DIAGNOSIS — E46 Unspecified protein-calorie malnutrition: Secondary | ICD-10-CM | POA: Diagnosis present

## 2017-11-08 DIAGNOSIS — K5720 Diverticulitis of large intestine with perforation and abscess without bleeding: Secondary | ICD-10-CM | POA: Diagnosis present

## 2017-11-08 DIAGNOSIS — K56600 Partial intestinal obstruction, unspecified as to cause: Secondary | ICD-10-CM | POA: Diagnosis present

## 2017-11-08 DIAGNOSIS — K651 Peritoneal abscess: Secondary | ICD-10-CM | POA: Diagnosis not present

## 2017-11-08 DIAGNOSIS — N184 Chronic kidney disease, stage 4 (severe): Secondary | ICD-10-CM | POA: Diagnosis present

## 2017-11-08 DIAGNOSIS — D473 Essential (hemorrhagic) thrombocythemia: Secondary | ICD-10-CM | POA: Diagnosis present

## 2017-11-08 DIAGNOSIS — Z6822 Body mass index (BMI) 22.0-22.9, adult: Secondary | ICD-10-CM

## 2017-11-08 DIAGNOSIS — K650 Generalized (acute) peritonitis: Secondary | ICD-10-CM | POA: Diagnosis present

## 2017-11-08 DIAGNOSIS — K56609 Unspecified intestinal obstruction, unspecified as to partial versus complete obstruction: Secondary | ICD-10-CM

## 2017-11-08 DIAGNOSIS — E43 Unspecified severe protein-calorie malnutrition: Secondary | ICD-10-CM | POA: Diagnosis present

## 2017-11-08 DIAGNOSIS — E860 Dehydration: Secondary | ICD-10-CM | POA: Diagnosis not present

## 2017-11-08 DIAGNOSIS — I4892 Unspecified atrial flutter: Secondary | ICD-10-CM | POA: Diagnosis present

## 2017-11-08 DIAGNOSIS — Z23 Encounter for immunization: Secondary | ICD-10-CM | POA: Diagnosis not present

## 2017-11-08 DIAGNOSIS — R402414 Glasgow coma scale score 13-15, 24 hours or more after hospital admission: Secondary | ICD-10-CM | POA: Diagnosis not present

## 2017-11-08 DIAGNOSIS — K631 Perforation of intestine (nontraumatic): Secondary | ICD-10-CM | POA: Diagnosis not present

## 2017-11-08 DIAGNOSIS — S3092XA Unspecified superficial injury of abdominal wall, initial encounter: Secondary | ICD-10-CM | POA: Diagnosis not present

## 2017-11-08 DIAGNOSIS — R4182 Altered mental status, unspecified: Secondary | ICD-10-CM | POA: Diagnosis not present

## 2017-11-08 DIAGNOSIS — C779 Secondary and unspecified malignant neoplasm of lymph node, unspecified: Secondary | ICD-10-CM | POA: Diagnosis not present

## 2017-11-08 DIAGNOSIS — Z452 Encounter for adjustment and management of vascular access device: Secondary | ICD-10-CM | POA: Diagnosis not present

## 2017-11-08 DIAGNOSIS — K5730 Diverticulosis of large intestine without perforation or abscess without bleeding: Secondary | ICD-10-CM | POA: Diagnosis not present

## 2017-11-08 DIAGNOSIS — Z978 Presence of other specified devices: Secondary | ICD-10-CM

## 2017-11-08 DIAGNOSIS — J9811 Atelectasis: Secondary | ICD-10-CM | POA: Diagnosis not present

## 2017-11-08 DIAGNOSIS — Z8719 Personal history of other diseases of the digestive system: Secondary | ICD-10-CM

## 2017-11-08 DIAGNOSIS — K9420 Gastrostomy complication, unspecified: Secondary | ICD-10-CM | POA: Diagnosis not present

## 2017-11-08 DIAGNOSIS — Z79899 Other long term (current) drug therapy: Secondary | ICD-10-CM

## 2017-11-08 DIAGNOSIS — R000 Tachycardia, unspecified: Secondary | ICD-10-CM | POA: Diagnosis not present

## 2017-11-08 DIAGNOSIS — K5721 Diverticulitis of large intestine with perforation and abscess with bleeding: Secondary | ICD-10-CM | POA: Diagnosis not present

## 2017-11-08 DIAGNOSIS — J90 Pleural effusion, not elsewhere classified: Secondary | ICD-10-CM | POA: Diagnosis not present

## 2017-11-08 DIAGNOSIS — D500 Iron deficiency anemia secondary to blood loss (chronic): Secondary | ICD-10-CM | POA: Diagnosis not present

## 2017-11-08 DIAGNOSIS — C188 Malignant neoplasm of overlapping sites of colon: Secondary | ICD-10-CM | POA: Diagnosis not present

## 2017-11-08 DIAGNOSIS — I6200 Nontraumatic subdural hemorrhage, unspecified: Secondary | ICD-10-CM | POA: Diagnosis present

## 2017-11-08 DIAGNOSIS — N200 Calculus of kidney: Secondary | ICD-10-CM | POA: Diagnosis not present

## 2017-11-08 DIAGNOSIS — R066 Hiccough: Secondary | ICD-10-CM | POA: Diagnosis present

## 2017-11-08 DIAGNOSIS — Z8711 Personal history of peptic ulcer disease: Secondary | ICD-10-CM

## 2017-11-08 DIAGNOSIS — E8809 Other disorders of plasma-protein metabolism, not elsewhere classified: Secondary | ICD-10-CM | POA: Diagnosis present

## 2017-11-08 DIAGNOSIS — R1312 Dysphagia, oropharyngeal phase: Secondary | ICD-10-CM | POA: Diagnosis not present

## 2017-11-08 HISTORY — DX: Diverticulum of bladder: N32.3

## 2017-11-08 LAB — URINALYSIS, ROUTINE W REFLEX MICROSCOPIC
BACTERIA UA: NONE SEEN
Bilirubin Urine: NEGATIVE
GLUCOSE, UA: NEGATIVE mg/dL
KETONES UR: 5 mg/dL — AB
Leukocytes, UA: NEGATIVE
NITRITE: NEGATIVE
PROTEIN: NEGATIVE mg/dL
Specific Gravity, Urine: 1.036 — ABNORMAL HIGH (ref 1.005–1.030)
pH: 5 (ref 5.0–8.0)

## 2017-11-08 LAB — CBC
HEMATOCRIT: 27.5 % — AB (ref 39.0–52.0)
HEMOGLOBIN: 9 g/dL — AB (ref 13.0–17.0)
MCH: 23.6 pg — AB (ref 26.0–34.0)
MCHC: 32.7 g/dL (ref 30.0–36.0)
MCV: 72 fL — ABNORMAL LOW (ref 78.0–100.0)
Platelets: 670 10*3/uL — ABNORMAL HIGH (ref 150–400)
RBC: 3.82 MIL/uL — AB (ref 4.22–5.81)
RDW: 15.3 % (ref 11.5–15.5)
WBC: 15.3 10*3/uL — ABNORMAL HIGH (ref 4.0–10.5)

## 2017-11-08 LAB — COMPREHENSIVE METABOLIC PANEL
ALBUMIN: 1.7 g/dL — AB (ref 3.5–5.0)
ALK PHOS: 92 U/L (ref 38–126)
ALT: 14 U/L — ABNORMAL LOW (ref 17–63)
ANION GAP: 9 (ref 5–15)
AST: 27 U/L (ref 15–41)
BUN: 39 mg/dL — ABNORMAL HIGH (ref 6–20)
CALCIUM: 8 mg/dL — AB (ref 8.9–10.3)
CO2: 24 mmol/L (ref 22–32)
Chloride: 96 mmol/L — ABNORMAL LOW (ref 101–111)
Creatinine, Ser: 1.33 mg/dL — ABNORMAL HIGH (ref 0.61–1.24)
GFR calc non Af Amer: 49 mL/min — ABNORMAL LOW (ref >60)
GFR, EST AFRICAN AMERICAN: 57 mL/min — AB (ref >60)
GLUCOSE: 108 mg/dL — AB (ref 65–99)
POTASSIUM: 4.2 mmol/L (ref 3.5–5.1)
SODIUM: 129 mmol/L — AB (ref 135–145)
TOTAL PROTEIN: 7.2 g/dL (ref 6.5–8.1)
Total Bilirubin: 0.5 mg/dL (ref 0.3–1.2)

## 2017-11-08 LAB — I-STAT CG4 LACTIC ACID, ED
LACTIC ACID, VENOUS: 1.52 mmol/L (ref 0.5–1.9)
LACTIC ACID, VENOUS: 1.15 mmol/L (ref 0.5–1.9)

## 2017-11-08 LAB — PROTIME-INR
INR: 1.11
PROTHROMBIN TIME: 14.2 s (ref 11.4–15.2)

## 2017-11-08 LAB — MAGNESIUM: Magnesium: 2.2 mg/dL (ref 1.7–2.4)

## 2017-11-08 LAB — I-STAT TROPONIN, ED: TROPONIN I, POC: 0.01 ng/mL (ref 0.00–0.08)

## 2017-11-08 LAB — APTT: APTT: 33 s (ref 24–36)

## 2017-11-08 LAB — LIPASE, BLOOD: Lipase: 19 U/L (ref 11–51)

## 2017-11-08 LAB — TSH: TSH: 0.348 u[IU]/mL — ABNORMAL LOW (ref 0.350–4.500)

## 2017-11-08 LAB — TROPONIN I: Troponin I: 0.04 ng/mL (ref <0.03)

## 2017-11-08 MED ORDER — DILTIAZEM HCL 100 MG IV SOLR
5.0000 mg/h | Freq: Once | INTRAVENOUS | Status: AC
  Administered 2017-11-08: 5 mg/h via INTRAVENOUS
  Filled 2017-11-08 (×2): qty 100

## 2017-11-08 MED ORDER — VANCOMYCIN HCL 10 G IV SOLR
2000.0000 mg | Freq: Once | INTRAVENOUS | Status: AC
  Administered 2017-11-08: 2000 mg via INTRAVENOUS
  Filled 2017-11-08: qty 2000

## 2017-11-08 MED ORDER — ACETAMINOPHEN 325 MG PO TABS
650.0000 mg | ORAL_TABLET | ORAL | Status: DC | PRN
  Administered 2017-11-12: 650 mg via ORAL
  Filled 2017-11-08: qty 2

## 2017-11-08 MED ORDER — VANCOMYCIN HCL IN DEXTROSE 750-5 MG/150ML-% IV SOLN
750.0000 mg | Freq: Two times a day (BID) | INTRAVENOUS | Status: DC
  Filled 2017-11-08 (×2): qty 150

## 2017-11-08 MED ORDER — PIPERACILLIN-TAZOBACTAM 3.375 G IVPB 30 MIN
3.3750 g | Freq: Once | INTRAVENOUS | Status: AC
  Administered 2017-11-08: 3.375 g via INTRAVENOUS
  Filled 2017-11-08: qty 50

## 2017-11-08 MED ORDER — AMIODARONE HCL IN DEXTROSE 360-4.14 MG/200ML-% IV SOLN
60.0000 mg/h | INTRAVENOUS | Status: AC
  Administered 2017-11-08: 60 mg/h via INTRAVENOUS
  Filled 2017-11-08: qty 200

## 2017-11-08 MED ORDER — SODIUM CHLORIDE 0.9 % IV BOLUS (SEPSIS)
1000.0000 mL | Freq: Once | INTRAVENOUS | Status: AC
  Administered 2017-11-08: 1000 mL via INTRAVENOUS

## 2017-11-08 MED ORDER — HEPARIN BOLUS VIA INFUSION
2000.0000 [IU] | Freq: Once | INTRAVENOUS | Status: AC
  Administered 2017-11-08: 2000 [IU] via INTRAVENOUS
  Filled 2017-11-08: qty 2000

## 2017-11-08 MED ORDER — PIPERACILLIN-TAZOBACTAM 3.375 G IVPB 30 MIN: 3.3750 g | Freq: Once | INTRAVENOUS | Status: DC

## 2017-11-08 MED ORDER — PANTOPRAZOLE SODIUM 40 MG PO TBEC
40.0000 mg | DELAYED_RELEASE_TABLET | Freq: Two times a day (BID) | ORAL | Status: DC
  Administered 2017-11-09 – 2017-11-14 (×13): 40 mg via ORAL
  Filled 2017-11-08 (×13): qty 1

## 2017-11-08 MED ORDER — AMIODARONE LOAD VIA INFUSION
150.0000 mg | Freq: Once | INTRAVENOUS | Status: AC
  Administered 2017-11-08: 150 mg via INTRAVENOUS
  Filled 2017-11-08: qty 83.34

## 2017-11-08 MED ORDER — AMIODARONE HCL IN DEXTROSE 360-4.14 MG/200ML-% IV SOLN
30.0000 mg/h | INTRAVENOUS | Status: DC
  Administered 2017-11-09 – 2017-11-10 (×4): 30 mg/h via INTRAVENOUS
  Filled 2017-11-08 (×4): qty 200

## 2017-11-08 MED ORDER — HEPARIN (PORCINE) IN NACL 100-0.45 UNIT/ML-% IJ SOLN
1600.0000 [IU]/h | INTRAMUSCULAR | Status: AC
  Administered 2017-11-08: 1150 [IU]/h via INTRAVENOUS
  Administered 2017-11-09 – 2017-11-11 (×4): 1600 [IU]/h via INTRAVENOUS
  Filled 2017-11-08 (×6): qty 250

## 2017-11-08 MED ORDER — PIPERACILLIN-TAZOBACTAM 3.375 G IVPB
3.3750 g | Freq: Three times a day (TID) | INTRAVENOUS | Status: DC
  Administered 2017-11-09 – 2017-11-23 (×41): 3.375 g via INTRAVENOUS
  Filled 2017-11-08 (×47): qty 50

## 2017-11-08 MED ORDER — ONDANSETRON HCL 4 MG/2ML IJ SOLN: 4.0000 mg | Freq: Four times a day (QID) | INTRAMUSCULAR | Status: DC | PRN

## 2017-11-08 MED ORDER — BACLOFEN 5 MG HALF TABLET
5.0000 mg | ORAL_TABLET | Freq: Three times a day (TID) | ORAL | Status: DC
  Filled 2017-11-08: qty 1

## 2017-11-08 MED ORDER — IOPAMIDOL (ISOVUE-300) INJECTION 61%
INTRAVENOUS | Status: AC
  Administered 2017-11-08: 100 mL
  Filled 2017-11-08: qty 100

## 2017-11-08 MED ORDER — SODIUM CHLORIDE 0.9 % IV SOLN
INTRAVENOUS | Status: DC
  Administered 2017-11-08: 21:00:00 via INTRAVENOUS
  Filled 2017-11-08 (×2): qty 1000

## 2017-11-08 NOTE — Consult Note (Signed)
Reason for Consult: New onset Atrial fib/ flutter with RVR Referring Physician: [facility] hospitalist  Roberto Owen is an 82 y.o. male.  HPI: Patient is 82 year old male with past medical history significant for episodic hypertension on no medications, history of GI bleed and recent past, history of NSAIDs use, acute blood loss anemia status post blood transfusion recently, history of syncope, history of diverticulitis with abscess recently discharged from the hospital came to ER because of vague abdominal pain associated with poor appetite and vomiting. Patient had an abdominal drain for his abscess which has stopped draining recently. Cardiologic consultation is called as patient was noted to be in A. fib/flutter with RVR received IV Cardizem bolus and was started on the drip with no significant rate control. Patient denies any palpitations. Denies any dizziness lightheadedness or syncope. Denies any chest pains. Denies any thyroid problems. 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 Denies any black tarry stools recently. States had hematuria in the past workup was negative old records not available.  Past Medical History:  Diagnosis Date  . Anemia   . Esophageal ulcer    hx/notes 09/15/2017  . Gastric ulcer    hx/notes 09/15/2017  . GI bleed   . History of blood transfusion 07/2017  . NSAID induced gastritis     Past Surgical History:  Procedure Laterality Date  . COLONOSCOPY  ~ 05/2016  . ESOPHAGOGASTRODUODENOSCOPY (EGD) WITH PROPOFOL N/A 07/25/2017   Performed by Mauri Pole, MD at [facility]  . IR CATHETER TUBE CHANGE  10/28/2017  . IR RADIOLOGIST EVAL & MGMT  09/30/2017    Family History  Problem Relation Age of Onset  . Arthritis Mother   . Arthritis Father   . Heart disease Neg Hx   . Kidney disease Neg Hx   . Diabetes Neg Hx     Social History:  reports that  has never smoked. he has never used smokeless tobacco. He reports that he does not  drink alcohol or use drugs.  Allergies: No Known Allergies  Medications: I have reviewed the patient's current medications.  Results for orders placed or performed during the hospital encounter of 11/08/17 (from the past 48 hour(s))  Lipase, blood     Status: None   Collection Time: 11/08/17 10:41 AM  Result Value Ref Range   Lipase 19 11 - 51 U/L  Comprehensive metabolic panel     Status: Abnormal   Collection Time: 11/08/17 10:41 AM  Result Value Ref Range   Sodium 129 (L) 135 - 145 mmol/L   Potassium 4.2 3.5 - 5.1 mmol/L   Chloride 96 (L) 101 - 111 mmol/L   CO2 24 22 - 32 mmol/L   Glucose, Bld 108 (H) 65 - 99 mg/dL   BUN 39 (H) 6 - 20 mg/dL   Creatinine, Ser 1.33 (H) 0.61 - 1.24 mg/dL   Calcium 8.0 (L) 8.9 - 10.3 mg/dL   Total Protein 7.2 6.5 - 8.1 g/dL   Albumin 1.7 (L) 3.5 - 5.0 g/dL   AST 27 15 - 41 U/L   ALT 14 (L) 17 - 63 U/L   Alkaline Phosphatase 92 38 - 126 U/L   Total Bilirubin 0.5 0.3 - 1.2 mg/dL   GFR calc non Af Amer 49 (L) >60 mL/min   GFR calc Af Amer 57 (L) >60 mL/min    Comment: (NOTE) The eGFR has been calculated using the CKD EPI equation. This calculation has not been validated in all  clinical situations. eGFR's persistently <60 mL/min signify possible Chronic Kidney Disease.    Anion gap 9 5 - 15  CBC     Status: Abnormal   Collection Time: 11/08/17 10:41 AM  Result Value Ref Range   WBC 15.3 (H) 4.0 - 10.5 K/uL   RBC 3.82 (L) 4.22 - 5.81 MIL/uL   Hemoglobin 9.0 (L) 13.0 - 17.0 g/dL   HCT 27.5 (L) 39.0 - 52.0 %   MCV 72.0 (L) 78.0 - 100.0 fL   MCH 23.6 (L) 26.0 - 34.0 pg   MCHC 32.7 30.0 - 36.0 g/dL   RDW 15.3 11.5 - 15.5 %   Platelets 670 (H) 150 - 400 K/uL  I-Stat CG4 Lactic Acid, ED     Status: None   Collection Time: 11/08/17 11:16 AM  Result Value Ref Range   Lactic Acid, Venous 1.52 0.5 - 1.9 mmol/L    Dg Chest 2 View  Result Date: 11/08/2017 CLINICAL DATA:  Pleural effusion. EXAM: CHEST  2 VIEW COMPARISON:  Radiographs of March 25, 2012. FINDINGS: Stable cardiomediastinal silhouette. No pneumothorax is noted. Right lung is clear. Mild left basilar atelectasis or infiltrate is noted. Bony thorax is unremarkable. IMPRESSION: Mild left basilar subsegmental atelectasis or infiltrate is noted. Followup PA and lateral chest X-ray is recommended in 3-4 weeks following trial of antibiotic therapy to ensure resolution and exclude underlying malignancy. Electronically Signed   By: Marijo Conception, M.D.   On: 11/08/2017 16:46   Ct Abdomen Pelvis W Contrast  Result Date: 11/08/2017 CLINICAL DATA:  82 year old male. Left lower quadrant abscess secondary to diverticular disease in September with percutaneous drain placement. Subsequent feculent drainage, with fluoro guided injection demonstrating fistula to the colon. Drain occlusion on 10/28/2017. Up size an exchange for a new 14 [place] drain at that time. 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 Recent decreased drainage volume from catheter. Loss of appetite for 2 days with vomiting. EXAM: CT ABDOMEN AND PELVIS WITH CONTRAST TECHNIQUE: Multidetector CT imaging of the abdomen and pelvis was performed using the standard protocol following bolus administration of intravenous contrast. CONTRAST:  163m ISOVUE-300 IOPAMIDOL (ISOVUE-300) INJECTION 61% COMPARISON:  Drain replacement images [id number] CT Abdomen and Pelvis 09/30/2017. FINDINGS: Lower chest: New loculated appearing small to moderate size left pleural effusion with simple fluid density (series 3, image 6). Associated left lung base compressive atelectasis. No definite lower lobe or lingula consolidation. No pericardial effusion.  Stable and negative right lung base. Hepatobiliary: Stable and negative, scattered punctate calcified granulomas. Pancreas: Negative. Spleen: Negative. Adrenals/Urinary Tract: Normal adrenal glands. Bilateral renal enhancement and contrast excretion is symmetric and within normal limits. No hydronephrosis or  hydroureter. 5 cm posterior right urinary bladder diverticulum appears unchanged. Stable and negative urinary bladder otherwise. Stomach/Bowel: Stable and negative rectum. Redundant sigmoid colon with persistent circumferential wall thickening in the proximal sigmoid just distal to the left abdominal percutaneous pigtail drain site (series 3, image 56). Adjacent left gutter/mesenteric stranding or trace fluid has not resolved since October and may be mildly increased (series 3, image 58). No extraluminal gas identified. The distal sigmoid appears stable and negative. The descending colon demonstrates increased distention with low-density stool to the splenic flexure. The lumen of the descending colon becomes narrow at the level of the pigtail drain and sigmoid wall thickening. See coronal images 57 52 through 58. Distal descending colon wall thickening. Redundant transverse colon. No retained stool at the splenic flexure. Small volume of fluid in the right colon.  Respiratory motion artifact in the right lower quadrant. The cecum is on a lax mesentery. The appendix is not identified today. Nondilated distal small bowel. No dilated small bowel loops. Decompressed stomach and duodenum. No other abdominal free fluid. Vascular/Lymphatic: Mild for age atherosclerosis. Major arterial structures are patent. Portal venous system appears patent. Stable lymph nodes. No lymphadenopathy. Reproductive: Negative. Other: No pelvic free fluid. Musculoskeletal: Stable.  No acute osseous abnormality identified. IMPRESSION: 1. The position of the left abdominal percutaneous pigtail catheter appears stable since October. No residual abscess but continued and perhaps progressive circumferential colonic wall thickening and mesenteric inflammation near the catheter. 2. Increased low-density stool distending the upstream left colon and splenic flexure raising the possibility of partial obstruction. 3. Consider Colonoscopy: The persistent  colonic wall thickening in #1 may be inflammatory but colonic tumor is difficult to exclude. 4. New since October small to moderate size loculated left pleural effusion. Associated lung base atelectasis without definite pneumonia. Electronically Signed   By: Roberto Owen M.D.   On: 11/08/2017 15:30    Review of Systems  Constitutional: Positive for malaise/fatigue.  Eyes: Negative for blurred vision.  Respiratory: Negative for shortness of breath.   Cardiovascular: Negative for chest pain, palpitations, orthopnea and leg swelling.  Gastrointestinal: Positive for abdominal pain and vomiting.  Genitourinary: Negative for dysuria.  Neurological: Positive for weakness. Negative for dizziness.   Blood pressure 118/69, pulse (!) 156, temperature (!) 97.5 F (36.4 C), temperature source Oral, resp. rate 17, height _0  (1.981 m), weight 93.4 kg (206 lb), SpO2 99 %. Physical Exam  Constitutional: He is oriented to person, place, and time.  Eyes: Conjunctivae are normal. Left eye exhibits no discharge. No scleral icterus.  Neck: Normal range of motion. Neck supple. No JVD present. No tracheal deviation present.  Cardiovascular:  Tachycardic. Irregularly irregular S1 and S2 soft  Respiratory:  Decreased breath sound at bases  GI:  Soft bowel sounds faint left lower quadrant abdominal drain and surgical dressing noted mild generalized tenderness noted. No guarding  Musculoskeletal: He exhibits no edema or tenderness.  Neurological: He is alert and oriented to person, place, and time.    Assessment/Plan: New onset A. fib/flutter with RVR chads score of 2 Hypertension Colonic diverticulitis and partial colon obstruction History of peptic ulcer disease secondary to NSAIDs History of GI bleed in the past History of hematuria in the past Chronic anemia secondary to above Plan Start heparin per pharmacy protocol Start amiodarone as per orders Continue Cardizem drip for now Check TSH Check 2-D  echo Discussed with daughter regarding rate control versus rhythm control using synchronized TEE assisted cardioversion, wanted to be treated medically for now. Charolette Forward 11/08/2017, 7:39 PM

## 2017-11-08 NOTE — ED Notes (Signed)
IV team at bedside 

## 2017-11-08 NOTE — H&P (Signed)
[facility] Admission History and Physical Service Pager: [phone]  Patient name: Roberto Owen Medical record number: [id number] Date of birth: 01/02/1935 Age: 82 y.o. Gender: male  Primary Care Provider: Patient, No Pcp Per Consultants: Surgery, IR, Cardiology Code Status: FULL  Chief Complaint: Lack of appetite with abdominal spasms  Assessment and Plan: Roberto Owen is a 82 y.o. male presenting with an in place abdominal drain due to previous episode of diverticulitis with abscess of the sigmoid colon and subsequent development of colonic fistula. PMH is significant for Peptic ulcer disease, history of GI bleed and previous episode of diverticulosis.  New Onset Afib: New onset. He has no previous cardiac history.  Possibly precipitated by infection.  Electrolytes are normal.  No chest pain.  Cardizem drip started in the ED.  Chads vasc score of 2.  Hasbled score of 1.  Has a history of peptic ulcer disease.  Will defer to cardiology for anticoagulation.  Blood pressure stable.  - Admit to stepdown, attending Dr. Mingo Amber - TSH - Trend Troponin - Cardiology consulted, appreciate recs  - Cardizem drip - Echo - Magnesium level  Loculated Left Pleural Effusion: Found on CT Abd and suspicous for PNA. Patient is afebrile but has productive cough with whitish phlegm. No difficulty breathing except when having GI spasms but is having desats into the mid 80s while in the ED. Currently still on room air. - Add Vancomycin, already on Zosyn for suspected diverticulitis with perforated abscess - Continuous pulse ox - Spiometry q1hr  Loss of Appetite/GI spasms: Differential includes diverticulitis vs abscess vs perforation vs obstruction. I would expect a perforated colon to be more painful on exam and patient does not have an acute abdomen on exam. Diverticulitis recurrence with/without perforated abscess is most likely but still I would expect more positive  findings on physical exam. Obstruction is unlikely given he has been having regular bowel movements and no vomiting.  CT abdomen and pelvis today shows persistent colonic wall thickening. - NPO bowel rest, sips with meds and ice chips - CT Abd/Pelvis scan in am - Zosyn dosed per pharmacy - Gripe water to help with GI spasms - Surgery consulted and has seen. Recommended bowel rest and awaiting results of CT scan. Appreciate recs. - IR consulted and will reassess abdominal drain on patient.  AKI: Creatinine 1.33 on admission.  Baseline creatinine around 0.8.  BUN creatinine is greater than 20 indicating prerenal.  Likely due to decreased oral intake.  1 L normal saline bolus in the ED - Normal saline at 175 cc/h -A.m. BMP  Hyponatremia: Sodium of 129.  Likely due to dehydration. -IV fluids as noted above  Thrombocytosis: Platelets of 670.  It is not chronic.  Likely due to acute infection. -Monitor   Anemia: Hemoglobin 9 which is at baseline.  No signs of overt bleeding. -Monitor with labs   FEN/GI: Protonix, NPO sips with meds and ice chips  Prophylaxis: Heparin  Disposition: admit to Telemetry  History of Present Illness:  Roberto Owen is a 82 y.o. male presenting with 4 days of loss of appetite, GI spasms, and cough with spitting up whitish mucous material. The spitup usually associated with hiccups or spasms  Per daughter he has been unable to tolerate solid foods (even soft solids like yogurt) but he can tolerate liquids. Per daughter Karena Addison has helped his spasms. She also noticed the area around his drain was warm today. The drain was placed in September due to diverticulitis  due to abscess. Then catheter was replaced because the original was not draining anymore. He only has pain when he has belly spasm or with hiccups.  Yesterday morning, he was complaining of burning sensation in chest that was resolved with Protonix.   He currently lives in [place]. Here visiting with  his daughter   Review Of Systems: Per HPI with the following additions: Denies recent fever, chills, abdominal pain, SOB, diarrhea, constipation, or blood in the stool.  ROS  Patient Active Problem List   Diagnosis Date Noted  . Hyponatremia 09/16/2017  . Diverticulitis of large intestine with abscess 09/16/2017  . PUD (peptic ulcer disease) 09/16/2017  . AKI (acute kidney injury) ([place]) 09/16/2017  . Diverticulitis of intestine with abscess 09/16/2017  . Orthostasis 09/16/2017  . Diverticular disease of intestine with perforation and abscess   . Acute gastric ulcer with hemorrhage   . Multiple gastric ulcers   . Anemia due to GI blood loss   . GI bleed 07/23/2017  . Microcytic anemia 07/23/2017    Past Medical History: Past Medical History:  Diagnosis Date  . Anemia   . Esophageal ulcer    hx/notes 09/15/2017  . Gastric ulcer    hx/notes 09/15/2017  . GI bleed   . History of blood transfusion 07/2017  . NSAID induced gastritis     Past Surgical History: Past Surgical History:  Procedure Laterality Date  . COLONOSCOPY  ~ 05/2016  . ESOPHAGOGASTRODUODENOSCOPY (EGD) WITH PROPOFOL N/A 07/25/2017   Performed by Mauri Pole, MD at [facility]  . IR CATHETER TUBE CHANGE  10/28/2017  . IR RADIOLOGIST EVAL & MGMT  09/30/2017    Social History: Social History   Tobacco Use  . Smoking status: Never Smoker  . Smokeless tobacco: Never Used  Substance Use Topics  . Alcohol use: No  . Drug use: No   Additional social history: See HPI Please also refer to relevant sections of EMR.  Family History: Family History  Problem Relation Age of Onset  . Arthritis Mother   . Arthritis Father   . Heart disease Neg Hx   . Kidney disease Neg Hx   . Diabetes Neg Hx     Allergies and Medications: No Known Allergies Current Facility-Administered Medications on File Prior to Encounter  Medication Dose Route Frequency Provider Last Rate Last Dose  . sodium chloride flush  (NS) 0.9 % injection 10 mL  10 mL Intracatheter Claudina Lick, MD       Current Outpatient Medications on File Prior to Encounter  Medication Sig Dispense Refill  . acetaminophen (TYLENOL) 500 MG tablet Take 1,000 mg by mouth every 6 (six) hours as needed for mild pain.    . ferrous sulfate 325 (65 FE) MG tablet Take 1 tablet (325 mg total) by mouth daily with breakfast. 30 tablet 5  . Multiple Vitamins-Minerals (ADULT ONE DAILY GUMMIES) CHEW Chew 1 tablet by mouth daily.     . pantoprazole (PROTONIX) 40 MG tablet TAKE 1 TABLET BY MOUTH TWICE DAILY 180 tablet 2  . Simethicone 180 MG CAPS Take 1 capsule every 4 (four) hours as needed by mouth (for gas, bloating).    . Sod Bicarb-Ginger-Fennel-Cham (GRIPE WATER) LIQD Take 30 mLs every 4 (four) hours as needed by mouth (for gas).     . traMADol (ULTRAM) 50 MG tablet Take 1 tablet (50 mg total) by mouth every 6 (six) hours as needed for moderate pain or severe pain. (Patient not taking: Reported on  10/23/2017) 20 tablet 0    Objective: BP 115/75   Pulse (!) 156   Temp (!) 97.5 F (36.4 C) (Oral)   Resp (!) 23   Ht 6\' 6"  (1.981 m)   Wt 206 lb (93.4 kg)   SpO2 100%   BMI 23.81 kg/m  Exam: Gen: Alert and Oriented x 3, NAD HEENT: Normocephalic, atraumatic, PERRLA Neck: trachea midline, no thyroidmegaly, no LAD CV: RRR, no murmurs, normal S1, S2 split, +2 pulses dorsalis pedis bilaterally Resp: decreased breath sounds, CTAB, no wheezing, rales, or rhonchi, comfortable work of breathing Abd: non-distended, non-tender, soft, +bs in all four quadrants, no hepatomegaly. Drain in place at the left lower quadrant in the abdomen with brownish-green discharge leaking for drain insertion site. Ext: no clubbing, cyanosis, or edema Neuro: CN II-XII intact, no focal or gross deficits Skin: warm, dry, intact, no rashes  Labs and Imaging: CBC BMET  Recent Labs  Lab 11/08/17 1041  WBC 15.3*  HGB 9.0*  HCT 27.5*  PLT 670*   Recent Labs  Lab  11/08/17 1041  NA 129*  K 4.2  CL 96*  CO2 24  BUN 39*  CREATININE 1.33*  GLUCOSE 108*  CALCIUM 8.0*     11/19 - CT Abd/Pelvis: IMPRESSION: 1. The position of the left abdominal percutaneous pigtail catheter appears stable since October. No residual abscess but continued and perhaps progressive circumferential colonic wall thickening and mesenteric inflammation near the catheter. 2. Increased low-density stool distending the upstream left colon and splenic flexure raising the possibility of partial obstruction.  3. Consider Colonoscopy: The persistent colonic wall thickening in #1 may be inflammatory but colonic tumor is difficult to exclude. 4. New since October small to moderate size loculated left pleural effusion. Associated lung base atelectasis without definite Pneumonia.  11/19 - CXR: IMPRESSION: Mild left basilar subsegmental atelectasis or infiltrate is noted. Followup PA and lateral chest X-ray is recommended in 3-4 weeks following trial of antibiotic therapy to ensure resolution and exclude underlying malignancy.  EKG: atrial fibrillation with RVR  Nuala Alpha, DO 11/08/2017, 5:59 PM PGY-1, [facility] Intern pager: [phone], text pages welcome  UPPER LEVEL ADDENDUM  I have read the above note and made revisions highlighted in blue.  Smiley Houseman, MD PGY-3 [facility] Pager [phone]

## 2017-11-08 NOTE — Progress Notes (Signed)
  Amiodarone Drug - Drug Interaction Consult Note  Recommendations: Current and home medications reviewed for DI No changes needed at this time  Amiodarone is metabolized by the cytochrome P450 system and therefore has the potential to cause many drug interactions. Amiodarone has an average plasma half-life of 50 days (range 20 to 100 days).   There is potential for drug interactions to occur several weeks or months after stopping treatment and the onset of drug interactions may be slow after initiating amiodarone.   []  Statins: Increased risk of myopathy. Simvastatin- restrict dose to 20mg  daily. Other statins: counsel patients to report any muscle pain or weakness immediately.  []  Anticoagulants: Amiodarone can increase anticoagulant effect. Consider warfarin dose reduction. Patients should be monitored closely and the dose of anticoagulant altered accordingly, remembering that amiodarone levels take several weeks to stabilize.  []  Antiepileptics: Amiodarone can increase plasma concentration of phenytoin, the dose should be reduced. Note that small changes in phenytoin dose can result in large changes in levels. Monitor patient and counsel on signs of toxicity.  []  Beta blockers: increased risk of bradycardia, AV block and myocardial depression. Sotalol - avoid concomitant use.  []   Calcium channel blockers (diltiazem and verapamil): increased risk of bradycardia, AV block and myocardial depression.  []   Cyclosporine: Amiodarone increases levels of cyclosporine. Reduced dose of cyclosporine is recommended.  []  Digoxin dose should be halved when amiodarone is started.  []  Diuretics: increased risk of cardiotoxicity if hypokalemia occurs.  []  Oral hypoglycemic agents (glyburide, glipizide, glimepiride): increased risk of hypoglycemia. Patient's glucose levels should be monitored closely when initiating amiodarone therapy.   []  Drugs that prolong the QT interval:  Torsades de pointes risk  may be increased with concurrent use - avoid if possible.  Monitor QTc, also keep magnesium/potassium WNL if concurrent therapy can't be avoided. Marland Kitchen Antibiotics: e.g. fluoroquinolones, erythromycin. . Antiarrhythmics: e.g. quinidine, procainamide, disopyramide, sotalol. . Antipsychotics: e.g. phenothiazines, haloperidol.  . Lithium, tricyclic antidepressants, and methadone. Thank You,   Bonnita Nasuti Pharm.D. CPP, BCPS Clinical Pharmacist [phone] 11/08/2017 8:25 PM

## 2017-11-08 NOTE — ED Notes (Signed)
EKG given to EDP.  

## 2017-11-08 NOTE — Progress Notes (Addendum)
Pharmacy Antibiotic Note  Roberto Owen is a 82 y.o. male admitted on 11/08/2017 with intra-abdominal infection.  Pharmacy has been consulted for Zosyn and vancomycin dosing. History of acute sigmoid diverticulitis with abscess, WBC 15.3, afebrile, sCr 1.3  Plan: Vancomycin 2000mg  IV once then 750mg  IV every 12 hours Zosyn 3.375g IV q8h (4 hour infusion).  Height: 6\' 6"  (198.1 cm) Weight: 206 lb (93.4 kg) IBW/kg (Calculated) : 91.4  Temp (24hrs), Avg:97.5 F (36.4 C), Min:97.5 F (36.4 C), Max:97.5 F (36.4 C)  Recent Labs  Lab 11/08/17 1041 11/08/17 1116  WBC 15.3*  --   CREATININE 1.33*  --   LATICACIDVEN  --  1.52    Estimated Creatinine Clearance: 57.3 mL/min (A) (by C-G formula based on SCr of 1.33 mg/dL (H)).    No Known Allergies  Thank you for allowing pharmacy to be a part of this patient's care.  Jodean Lima Elese Rane 11/08/2017 6:22 PM

## 2017-11-08 NOTE — Consult Note (Signed)
Reason for Consult: Abdominal pain  Referring Physician: EDP  Roberto Owen is an 82 y.o. male.  He was recently hospitalized from 09/16/2017 and discharged on 09/21/2017.  He presented with acute sigmoid diverticulitis with abscess.  Possibility of neoplasia was entertained but felt to be less likely.  He underwent successful drainage of his abscess by interventional radiology on September 27.  He responded quite well.  He was managed by the [facility] with [place] as consultants.    Past history significant for peptic ulcer disease documented by EGD in August  Dr. Silverio Decamp is his gastroenterologist..  No report of colonoscopy.  Also recent blood transfusion for anemia.  He reports normal colonoscopy 1 year ago and is in [place] but we have no records.     He did well following discharge.  Resume normal diet and had normal bowel movements.  Drain had some enteric drainage.  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  He continued to do well.  He returned to interventional radiology about 10 days ago because there was more drainage on the bandage and they thought the tube was obstructed.  They upsized drain and send him home.  He did well for 2 days but then lost his appetite and then developed some pickups and reported left lower quadrant pain with pickups or coughing.  He remains anorexic but has not vomited.  He says he continues to have regular daily bowel movements.    He was scheduled for follow-up in interventional radiology tomorrow    Lab today shows hemoglobin 9.0.  WBC 15,300.  Sodium 139.  Creatinine 1.33.  Lipase 19.  Lactic acid 1.52.     CT abdomen and pelvis today shows the percutaneous catheter to be in stable position.  No residual abscess but continued and perhaps progressive circumferential colonic wall thickening and mesenteric inflammation near the catheter.  Low-density stool in the upstream left:Marland Kitchen  Slight colonic  distention proximally.  Colonoscopy was recommended to rule out cancer.      In the emergency department he has developed tachycardia of about 145-150.  He is been placed on Cardizem drip.       He will be admitted to the [facility] and we will follow as consultants.  HPI:   Past Medical History:  Diagnosis Date  . Anemia   . Esophageal ulcer    hx/notes 09/15/2017  . Gastric ulcer    hx/notes 09/15/2017  . GI bleed   . History of blood transfusion 07/2017  . NSAID induced gastritis     Past Surgical History:  Procedure Laterality Date  . COLONOSCOPY  ~ 05/2016  . ESOPHAGOGASTRODUODENOSCOPY (EGD) WITH PROPOFOL N/A 07/25/2017   Performed by Mauri Pole, MD at [facility] [place] [place]  . IR CATHETER TUBE CHANGE  10/28/2017  . IR RADIOLOGIST EVAL & MGMT  09/30/2017    Family History  Problem Relation Age of Onset  . Arthritis Mother   . Arthritis Father   . Heart disease Neg Hx   . Kidney disease Neg Hx   . Diabetes Neg Hx     Social History:  reports that  has never smoked. he has never used smokeless tobacco. He reports that he does not drink alcohol or use drugs.  Allergies: No Known Allergies  Medications: I have reviewed the patient's current medications.  Results for orders placed or performed during the hospital encounter of 11/08/17 (from the past 48 hour(s))  Lipase, blood     Status: None   Collection Time: 11/08/17 10:41 AM  Result Value Ref Range   Lipase 19 11 - 51 U/L  Comprehensive metabolic panel     Status: Abnormal   Collection Time: 11/08/17 10:41 AM  Result Value Ref Range   Sodium 129 (L) 135 - 145 mmol/L   Potassium 4.2 3.5 - 5.1 mmol/L   Chloride 96 (L) 101 - 111 mmol/L   CO2 24 22 - 32 mmol/L   Glucose, Bld 108 (H) 65 - 99 mg/dL   BUN 39 (H) 6 - 20 mg/dL   Creatinine, Ser 1.33 (H) 0.61 - 1.24 mg/dL   Calcium 8.0 (L) 8.9 - 10.3 mg/dL   Total Protein 7.2 6.5 - 8.1 g/dL   Albumin 1.7 (L) 3.5 - 5.0 g/dL   AST 27 15 - 41 U/L   ALT 14 (L) 17 -  63 U/L   Alkaline Phosphatase 92 38 - 126 U/L   Total Bilirubin 0.5 0.3 - 1.2 mg/dL   GFR calc non Af Amer 49 (L) >60 mL/min   GFR calc Af Amer 57 (L) >60 mL/min    Comment: (NOTE) The eGFR has been calculated using the CKD EPI equation. This calculation has not been validated in all clinical situations. eGFR's persistently <60 mL/min signify possible Chronic Kidney Disease.    Anion gap 9 5 - 15  CBC     Status: Abnormal   Collection Time: 11/08/17 10:41 AM  Result Value Ref Range   WBC 15.3 (H) 4.0 - 10.5 K/uL   RBC 3.82 (L) 4.22 - 5.81 MIL/uL   Hemoglobin 9.0 (L) 13.0 - 17.0 g/dL   HCT 27.5 (L) 39.0 - 52.0 %   MCV 72.0 (L) 78.0 - 100.0 fL   MCH 23.6 (L) 26.0 - 34.0 pg   MCHC 32.7 30.0 - 36.0 g/dL   RDW 15.3 11.5 - 15.5 %   Platelets 670 (H) 150 - 400 K/uL  I-Stat CG4 Lactic Acid, ED     Status: None   Collection Time: 11/08/17 11:16 AM  Result Value Ref Range   Lactic Acid, Venous 1.52 0.5 - 1.9 mmol/L    Dg Chest 2 View  Result Date: 11/08/2017 CLINICAL DATA:  Pleural effusion. EXAM: CHEST  2 VIEW COMPARISON:  Radiographs of March 25, 2012. FINDINGS: Stable cardiomediastinal silhouette. No pneumothorax is noted. Right lung is clear. Mild left basilar atelectasis or infiltrate is noted. Bony thorax is unremarkable. IMPRESSION: Mild left basilar subsegmental atelectasis or infiltrate is noted. Followup PA and lateral chest X-ray is recommended in 3-4 weeks following trial of antibiotic therapy to ensure resolution and exclude underlying malignancy. Electronically Signed   By: Marijo Conception, M.D.   On: 11/08/2017 16:46   Ct Abdomen Pelvis W Contrast  Result Date: 11/08/2017 CLINICAL DATA:  82 year old male. Left lower quadrant abscess secondary to diverticular disease in September with percutaneous drain placement. Subsequent feculent drainage, with fluoro guided injection demonstrating fistula to the colon. Drain occlusion on 10/28/2017. Up size an exchange for a new 14  [place] drain at that time. 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 Recent decreased drainage volume from catheter. Loss of appetite for 2 days with vomiting. EXAM: CT ABDOMEN AND PELVIS WITH CONTRAST TECHNIQUE: Multidetector CT imaging of the abdomen and pelvis was performed using the standard protocol following bolus administration of intravenous contrast. CONTRAST:  138m ISOVUE-300 IOPAMIDOL (ISOVUE-300) INJECTION 61% COMPARISON:  Drain replacement images [id number] CT Abdomen and  Pelvis 09/30/2017. FINDINGS: Lower chest: New loculated appearing small to moderate size left pleural effusion with simple fluid density (series 3, image 6). Associated left lung base compressive atelectasis. No definite lower lobe or lingula consolidation. No pericardial effusion.  Stable and negative right lung base. Hepatobiliary: Stable and negative, scattered punctate calcified granulomas. Pancreas: Negative. Spleen: Negative. Adrenals/Urinary Tract: Normal adrenal glands. Bilateral renal enhancement and contrast excretion is symmetric and within normal limits. No hydronephrosis or hydroureter. 5 cm posterior right urinary bladder diverticulum appears unchanged. Stable and negative urinary bladder otherwise. Stomach/Bowel: Stable and negative rectum. Redundant sigmoid colon with persistent circumferential wall thickening in the proximal sigmoid just distal to the left abdominal percutaneous pigtail drain site (series 3, image 56). Adjacent left gutter/mesenteric stranding or trace fluid has not resolved since October and may be mildly increased (series 3, image 58). No extraluminal gas identified. The distal sigmoid appears stable and negative. The descending colon demonstrates increased distention with low-density stool to the splenic flexure. The lumen of the descending colon becomes narrow at the level of the pigtail drain and sigmoid wall thickening. See coronal images 57 52 through 58. Distal descending colon wall  thickening. Redundant transverse colon. No retained stool at the splenic flexure. Small volume of fluid in the right colon. Respiratory motion artifact in the right lower quadrant. The cecum is on a lax mesentery. The appendix is not identified today. Nondilated distal small bowel. No dilated small bowel loops. Decompressed stomach and duodenum. No other abdominal free fluid. Vascular/Lymphatic: Mild for age atherosclerosis. Major arterial structures are patent. Portal venous system appears patent. Stable lymph nodes. No lymphadenopathy. Reproductive: Negative. Other: No pelvic free fluid. Musculoskeletal: Stable.  No acute osseous abnormality identified. IMPRESSION: 1. The position of the left abdominal percutaneous pigtail catheter appears stable since October. No residual abscess but continued and perhaps progressive circumferential colonic wall thickening and mesenteric inflammation near the catheter. 2. Increased low-density stool distending the upstream left colon and splenic flexure raising the possibility of partial obstruction. 3. Consider Colonoscopy: The persistent colonic wall thickening in #1 may be inflammatory but colonic tumor is difficult to exclude. 4. New since October small to moderate size loculated left pleural effusion. Associated lung base atelectasis without definite pneumonia. Electronically Signed   By: Genevie Ann M.D.   On: 11/08/2017 15:30   Physical exam Blood pressure 130/77, pulse (!) 119, temperature (!) 97.5 F (36.4 C), temperature source Oral, resp. rate 16, height _0  (1.981 m), weight 93.4 kg (206 lb), SpO2 100 %. General: Alert.  Cooperative.  Does not appear toxic.  Family member in room. Neck: Supple.  Nontender.  No swelling.  No JVD.  No adenopathy Eyes: Sclera clear Lungs: Clear to auscultation Cardiovascular:  regular tachycardia.  Femoral pulses intact.  No gross edema Abdomen: Abdomen is soft.  Clearly not distended.  No mass.  Drain left lower quadrant with  scant amount of dark drainage in tube and bag but not much.  A little bit of tenderness around the drain but nothing dramatic.  Hypoactive bowel sounds.  No hernia noted.  No rebound and no guarding. Musculoskeletal: Normal range of motion.  Neurological: He is alert and oriented to person, place, and time.  Skin: Skin is warm and dry.   Assessment/Plan:  Sigmoid colon thickening and inflammation.  It is presumed that this is acute diverticulitis with perforation.  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  Radiology is concerned about the thickening  in the colon and the possibility of neoplasia as a secondary possibility. -Admit to internal medicine because of tachycardia....EDP to contact [facility]. -IV antibiotics. -Bowel rest.  Nothing by mouth except meds and ice chips -Consult IR tomorrow.  Not sure whether upsizing the drain has any relationship to his new symptoms, although temporarily they are related -Ideally, the inflammation will settle down, the fistula will close, allowing outpatient workup with colonoscopy and elective one stage resection -If he does not respond he may require laparotomy, sigmoid colectomy and colostomy.  He and his family are aware of this possibility  Tachycardia.  On Cardizem drip.  Etiology unclear.  Needs evaluation by medicine and possibly cardiology.  Telemetry and/or stepdown unit.  History peptic ulcer disease by endoscopy History anemia requiring transfusion     Verlyn Lambert M. Dalbert Batman, M.D., [facility] General and Minimally invasive Surgery Breast and Colorectal Surgery Office:   [phone] Pager:   [phone] 5:44 PM

## 2017-11-08 NOTE — ED Notes (Signed)
Spoke with provider cancel second Lactic acid.

## 2017-11-08 NOTE — Progress Notes (Signed)
ANTICOAGULATION CONSULT NOTE - Initial Consult  Pharmacy Consult for Heparin  Indication: atrial fibrillation  No Known Allergies  Patient Measurements: Height: 6\' 6"  (198.1 cm) Weight: 206 lb (93.4 kg) IBW/kg (Calculated) : 91.4    Vital Signs: Temp: 97.5 F (36.4 C) (11/19 1034) Temp Source: Oral (11/19 1034) BP: 107/60 (11/19 2000) Pulse Rate: 156 (11/19 1730)  Labs: Recent Labs    11/08/17 1041  HGB 9.0*  HCT 27.5*  PLT 670*  CREATININE 1.33*    Estimated Creatinine Clearance: 57.3 mL/min (A) (by C-G formula based on SCr of 1.33 mg/dL (H)).   Medical History: Past Medical History:  Diagnosis Date  . Anemia   . Esophageal ulcer    hx/notes 09/15/2017  . Gastric ulcer    hx/notes 09/15/2017  . GI bleed   . History of blood transfusion 07/2017  . NSAID induced gastritis       Assessment: 82yom admitted with abdominal pain found to be in Afib RVR 150s.  Diltiazem and amiodarone used for rate control.  Begin heparin for anticoagulation.  H/H low stable, PLTC 600, no bleeding noted, but with Hx GIB will dose conservatively.    Goal of Therapy:  Heparin level 0.3-0.7 units/ml Monitor platelets by anticoagulation protocol: Yes   Plan:  Heparin bolus 2000 uts IV x1 Heparin drip 1150 uts/hr Daily HL, CBC   Bonnita Nasuti Pharm.D. CPP, BCPS Clinical Pharmacist [phone] 11/08/2017 8:22 PM

## 2017-11-08 NOTE — ED Provider Notes (Signed)
[facility] EMERGENCY DEPARTMENT Provider Note   CSN: [id number] Arrival date & time: 11/08/17  1027     History   Chief Complaint Chief Complaint  Patient presents with  . loss of appetite/vomiting    HPI Roberto Owen is a 82 y.o. male who presents with abdominal pain. PMH significant for diverticulitis of large intestine with abscess, hx of GIB, PUD, and anemia. The patients daughter is at bedside who has been caring for him and provides most of history. The patient was originally diagnosed with a diverticular abscess on 9/26. On 9/27 he underwent CT guided abscess drain next to the sigmoid colon and was discharged on 10/2. His daughter states that initially he did very well. She has been providing local wound care over the area and the drain bag was emptied twice a day. On 10/11 another CT was done to assess progress and it was found that he developed 2 complex fistulas. On 10/30 he was seen by IR again and fistula again was seen. It was determined the drainage catheter was appropriately positioned. He presented to the ED on 11/3 because of fecal drainage around the catheter site. IR was consulted who recommended placement of a larger PERC drain which was done on 11/8. He did well after the drain was replaced for the first couple days. About  5 days ago the patient started to lose his appetite, having hiccups which have caused him to not tolerated solid foods, vomiting, and spasms over the area of the drain. He tried Gas-X and a PPI with minimal relief. This has continued to the point where he is only eating chicken broth and pedialyte per his daughter. They also note that they have not had to empty the drainage bag since the last cathter was placed. The patient denies fever, chills, chest pain, SOB, constant abdominal pain, blood in the stool, decreased urine.  HPI  Past Medical History:  Diagnosis Date  . Anemia   . Esophageal ulcer    hx/notes 09/15/2017  .  Gastric ulcer    hx/notes 09/15/2017  . GI bleed   . History of blood transfusion 07/2017  . NSAID induced gastritis     Patient Active Problem List   Diagnosis Date Noted  . Hyponatremia 09/16/2017  . Diverticulitis of large intestine with abscess 09/16/2017  . PUD (peptic ulcer disease) 09/16/2017  . AKI (acute kidney injury) ([place]) 09/16/2017  . Diverticulitis of intestine with abscess 09/16/2017  . Orthostasis 09/16/2017  . Diverticular disease of intestine with perforation and abscess   . Acute gastric ulcer with hemorrhage   . Multiple gastric ulcers   . Anemia due to GI blood loss   . GI bleed 07/23/2017  . Microcytic anemia 07/23/2017    Past Surgical History:  Procedure Laterality Date  . COLONOSCOPY  ~ 05/2016  . ESOPHAGOGASTRODUODENOSCOPY (EGD) WITH PROPOFOL N/A 07/25/2017   Performed by Mauri Pole, MD at [place]  . IR CATHETER TUBE CHANGE  10/28/2017  . IR RADIOLOGIST EVAL & MGMT  09/30/2017       Home Medications    Prior to Admission medications   Medication Sig Start Date End Date Taking? Authorizing Provider  acetaminophen (TYLENOL) 500 MG tablet Take 1,000 mg by mouth every 6 (six) hours as needed for mild pain.    [provider]  ferrous sulfate 325 (65 FE) MG tablet Take 1 tablet (325 mg total) by mouth daily with breakfast. 07/27/17   Tawny Asal, MD  Multiple Vitamins-Minerals (ADULT ONE DAILY GUMMIES) CHEW Chew 1 tablet by mouth daily.     [provider]  pantoprazole (PROTONIX) 40 MG tablet TAKE 1 TABLET BY MOUTH TWICE DAILY 09/10/17   Lorella Nimrod, MD  traMADol (ULTRAM) 50 MG tablet Take 1 tablet (50 mg total) by mouth every 6 (six) hours as needed for moderate pain or severe pain. Patient not taking: Reported on 10/23/2017 09/21/17   Aline August, MD    Family History Family History  Problem Relation Age of Onset  . Arthritis Mother   . Arthritis Father   . Heart disease Neg Hx   . Kidney disease Neg Hx   .  Diabetes Neg Hx     Social History Social History   Tobacco Use  . Smoking status: Never Smoker  . Smokeless tobacco: Never Used  Substance Use Topics  . Alcohol use: No  . Drug use: No     Allergies   Patient has no known allergies.   Review of Systems Review of Systems  Constitutional: Positive for appetite change. Negative for chills and fever.  Respiratory: Positive for cough. Negative for shortness of breath.   Cardiovascular: Negative for chest pain.  Gastrointestinal: Positive for abdominal pain, nausea and vomiting. Negative for blood in stool.  Genitourinary: Negative for difficulty urinating.  All other systems reviewed and are negative.    Physical Exam Updated Vital Signs BP 121/66 (BP Location: Right Arm)   Pulse 78   Temp (!) 97.5 F (36.4 C) (Oral)   Resp 16   Ht 6\' 6"  (1.981 m)   Wt 93.4 kg (206 lb)   SpO2 100%   BMI 23.81 kg/m   Physical Exam  Constitutional: He is oriented to person, place, and time. He appears well-developed and well-nourished. No distress.  Thin, NAD. Hiccups frequently  HENT:  Head: Normocephalic and atraumatic.  Eyes: Conjunctivae are normal. Pupils are equal, round, and reactive to light. Right eye exhibits no discharge. Left eye exhibits no discharge. No scleral icterus.  Neck: Normal range of motion.  Cardiovascular: An irregularly irregular rhythm present. Tachycardia present. Exam reveals no gallop and no friction rub.  No murmur heard. Pulmonary/Chest: Effort normal and breath sounds normal. No stridor. No respiratory distress. He has no wheezes. He has no rales. He exhibits no tenderness.  Abdominal: Soft. Bowel sounds are normal. He exhibits no distension and no mass. There is no tenderness. There is no rebound and no guarding. No hernia.  Drain over left abdomen with fecal matter coming out. Drainage bag is empty  Visible spasms of the abdomen that occur ~30sec-20min  Neurological: He is alert and oriented to  person, place, and time.  Skin: Skin is warm and dry.  Psychiatric: He has a normal mood and affect. His behavior is normal.  Nursing note and vitals reviewed.    ED Treatments / Results  Labs (all labs ordered are listed, but only abnormal results are displayed) Labs Reviewed  COMPREHENSIVE METABOLIC PANEL - Abnormal; Notable for the following components:      Result Value   Sodium 129 (*)    Chloride 96 (*)    Glucose, Bld 108 (*)    BUN 39 (*)    Creatinine, Ser 1.33 (*)    Calcium 8.0 (*)    Albumin 1.7 (*)    ALT 14 (*)    GFR calc non Af Amer 49 (*)    GFR calc Af Amer 57 (*)    All other  components within normal limits  CBC - Abnormal; Notable for the following components:   WBC 15.3 (*)    RBC 3.82 (*)    Hemoglobin 9.0 (*)    HCT 27.5 (*)    MCV 72.0 (*)    MCH 23.6 (*)    Platelets 670 (*)    All other components within normal limits  LIPASE, BLOOD  URINALYSIS, ROUTINE W REFLEX MICROSCOPIC  I-STAT CG4 LACTIC ACID, ED  I-STAT TROPONIN, ED    EKG  EKG Interpretation  Date/Time:  Monday November 08 2017 13:39:47 EST Ventricular Rate:  146 PR Interval:    QRS Duration: 74 QT Interval:  296 QTC Calculation: 462 R Axis:   13 Text Interpretation:  Atrial fibrillation with rapid V-rate Borderline low voltage, extremity leads Abnormal R-wave progression, early transition Repolarization abnormality, prob rate related Baseline wander in lead(s) V1 V3 Since previous tracing afib is new, rate increased Confirmed by Alfonzo Beers [phone]) on 11/08/2017 1:43:01 PM       Radiology Dg Chest 2 View  Result Date: 11/08/2017 CLINICAL DATA:  Pleural effusion. EXAM: CHEST  2 VIEW COMPARISON:  Radiographs of March 25, 2012. FINDINGS: Stable cardiomediastinal silhouette. No pneumothorax is noted. Right lung is clear. Mild left basilar atelectasis or infiltrate is noted. Bony thorax is unremarkable. IMPRESSION: Mild left basilar subsegmental atelectasis or infiltrate is  noted. Followup PA and lateral chest X-ray is recommended in 3-4 weeks following trial of antibiotic therapy to ensure resolution and exclude underlying malignancy. Electronically Signed   By: Marijo Conception, M.D.   On: 11/08/2017 16:46   Ct Abdomen Pelvis W Contrast  Result Date: 11/08/2017 CLINICAL DATA:  82 year old male. Left lower quadrant abscess secondary to diverticular disease in September with percutaneous drain placement. Subsequent feculent drainage, with fluoro guided injection demonstrating fistula to the colon. Drain occlusion on 10/28/2017. Up size an exchange for a new 14 [place] drain at that time. 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 Recent decreased drainage volume from catheter. Loss of appetite for 2 days with vomiting. EXAM: CT ABDOMEN AND PELVIS WITH CONTRAST TECHNIQUE: Multidetector CT imaging of the abdomen and pelvis was performed using the standard protocol following bolus administration of intravenous contrast. CONTRAST:  177mL ISOVUE-300 IOPAMIDOL (ISOVUE-300) INJECTION 61% COMPARISON:  Drain replacement images [id number]. CT Abdomen and Pelvis 09/30/2017. FINDINGS: Lower chest: New loculated appearing small to moderate size left pleural effusion with simple fluid density (series 3, image 6). Associated left lung base compressive atelectasis. No definite lower lobe or lingula consolidation. No pericardial effusion.  Stable and negative right lung base. Hepatobiliary: Stable and negative, scattered punctate calcified granulomas. Pancreas: Negative. Spleen: Negative. Adrenals/Urinary Tract: Normal adrenal glands. Bilateral renal enhancement and contrast excretion is symmetric and within normal limits. No hydronephrosis or hydroureter. 5 cm posterior right urinary bladder diverticulum appears unchanged. Stable and negative urinary bladder otherwise. Stomach/Bowel: Stable and negative rectum. Redundant sigmoid colon with persistent circumferential wall thickening in the proximal  sigmoid just distal to the left abdominal percutaneous pigtail drain site (series 3, image 56). Adjacent left gutter/mesenteric stranding or trace fluid has not resolved since October and may be mildly increased (series 3, image 58). No extraluminal gas identified. The distal sigmoid appears stable and negative. The descending colon demonstrates increased distention with low-density stool to the splenic flexure. The lumen of the descending colon becomes narrow at the level of the pigtail drain and sigmoid wall thickening. See coronal images 57 52 through 58. Distal descending colon wall  thickening. Redundant transverse colon. No retained stool at the splenic flexure. Small volume of fluid in the right colon. Respiratory motion artifact in the right lower quadrant. The cecum is on a lax mesentery. The appendix is not identified today. Nondilated distal small bowel. No dilated small bowel loops. Decompressed stomach and duodenum. No other abdominal free fluid. Vascular/Lymphatic: Mild for age atherosclerosis. Major arterial structures are patent. Portal venous system appears patent. Stable lymph nodes. No lymphadenopathy. Reproductive: Negative. Other: No pelvic free fluid. Musculoskeletal: Stable.  No acute osseous abnormality identified. IMPRESSION: 1. The position of the left abdominal percutaneous pigtail catheter appears stable since October. No residual abscess but continued and perhaps progressive circumferential colonic wall thickening and mesenteric inflammation near the catheter. 2. Increased low-density stool distending the upstream left colon and splenic flexure raising the possibility of partial obstruction. 3. Consider Colonoscopy: The persistent colonic wall thickening in #1 may be inflammatory but colonic tumor is difficult to exclude. 4. New since October small to moderate size loculated left pleural effusion. Associated lung base atelectasis without definite pneumonia. Electronically Signed   By: Genevie Ann M.D.   On: 11/08/2017 15:30    Procedures Procedures (including critical care time)  CRITICAL CARE Performed by: Recardo Evangelist   Total critical care time: 40 minutes  Critical care time was exclusive of separately billable procedures and treating other patients.  Critical care was necessary to treat or prevent imminent or life-threatening deterioration.  Critical care was time spent personally by me on the following activities: development of treatment plan with patient and/or surrogate as well as nursing, discussions with consultants, evaluation of patient's response to treatment, examination of patient, obtaining history from patient or surrogate, ordering and performing treatments and interventions, ordering and review of laboratory studies, ordering and review of radiographic studies, pulse oximetry and re-evaluation of patient's condition.   Medications Ordered in ED Medications  sodium chloride 0.9 % 1,000 mL infusion (not administered)  piperacillin-tazobactam (ZOSYN) IVPB 3.375 g (not administered)  piperacillin-tazobactam (ZOSYN) IVPB 3.375 g (not administered)  sodium chloride 0.9 % bolus 1,000 mL (0 mLs Intravenous Stopped 11/08/17 1455)  iopamidol (ISOVUE-300) 61 % injection (100 mLs  Contrast Given 11/08/17 1500)  diltiazem (CARDIZEM) 100 mg in dextrose 5 % 100 mL (1 mg/mL) infusion (15 mg/hr Intravenous Rate/Dose Change 11/08/17 1719)     Initial Impression / Assessment and Plan / ED Course  I have reviewed the triage vital signs and the nursing notes.  Pertinent labs & imaging results that were available during my care of the patient were reviewed by me and considered in my medical decision making (see chart for details).  82 year old male presents with multiple complex problems as detailed below:  1. Dehydration due to decreased oral intake and anorexia - pt has frequent hiccuping and is unable to tolerate solid foods. He is in A.fib with RVR but  otherwise vitals are normal.   2. Diverticulitis with abscess and fistula and possible bowel obstruction - Continuous problems with drainage site. CT shows possible obstruction and inflammation. Surgery will consult. They doubt obstruction but will follow. IR was consulted as well who will follow.  3. A. Fib with RVR - patient arrives with heart rate in the 120s-150s and EKG shows A.fib. He denies chest pain or SOB. He denies cardiac history or arrhythmias. Likely due to underlying problem  4. Mild-mod loculated L pleural effusion - Unclear etiology. No fever or cardiac hx. CXR pending.  He was started on  a diltiazem drip for A.fib. He was given fluids for dehydration. Will also give Zosyn to cover for possible reinfection at the request of surgery. CBC shows leukocytosis of 15.3 and stable anemia. CMP shows hyponatremia (129), elevated Scr (1.33). Lactic acid is normal. Family medicine to admit.  Final Clinical Impressions(s) / ED Diagnoses   Final diagnoses:  Abdominal pain  Dehydration  AKI (acute kidney injury) ([place])  Hyponatremia  Pleural effusion  Atrial fibrillation with RVR [facility])    ED Discharge Orders    None       Recardo Evangelist, PA-C 11/08/17 1848    Recardo Evangelist, PA-C 11/08/17 1848    Pixie Casino, MD 11/09/17 [phone]

## 2017-11-08 NOTE — ED Triage Notes (Signed)
Patient complains of loss of appetite x 2 days with vomiting. States that his abdominal drains for his abscess have stopped draining and than the vomiting developed. Alert and oriented, complains of weakness

## 2017-11-09 ENCOUNTER — Inpatient Hospital Stay: Admission: RE | Admit: 2017-11-09 | Payer: Medicare Other | Source: Ambulatory Visit

## 2017-11-09 ENCOUNTER — Other Ambulatory Visit: Payer: Medicare Other

## 2017-11-09 DIAGNOSIS — I4891 Unspecified atrial fibrillation: Secondary | ICD-10-CM

## 2017-11-09 DIAGNOSIS — J90 Pleural effusion, not elsewhere classified: Secondary | ICD-10-CM | POA: Diagnosis present

## 2017-11-09 LAB — COMPREHENSIVE METABOLIC PANEL
ALT: 12 U/L — ABNORMAL LOW (ref 17–63)
AST: 22 U/L (ref 15–41)
Albumin: 1.5 g/dL — ABNORMAL LOW (ref 3.5–5.0)
Alkaline Phosphatase: 73 U/L (ref 38–126)
Anion gap: 9 (ref 5–15)
BUN: 28 mg/dL — ABNORMAL HIGH (ref 6–20)
CO2: 21 mmol/L — ABNORMAL LOW (ref 22–32)
Calcium: 7.5 mg/dL — ABNORMAL LOW (ref 8.9–10.3)
Chloride: 101 mmol/L (ref 101–111)
Creatinine, Ser: 1.27 mg/dL — ABNORMAL HIGH (ref 0.61–1.24)
GFR calc Af Amer: 60 mL/min — ABNORMAL LOW (ref >60)
GFR calc non Af Amer: 52 mL/min — ABNORMAL LOW (ref >60)
Glucose, Bld: 106 mg/dL — ABNORMAL HIGH (ref 65–99)
Potassium: 3.9 mmol/L (ref 3.5–5.1)
Sodium: 131 mmol/L — ABNORMAL LOW (ref 135–145)
Total Bilirubin: 0.4 mg/dL (ref 0.3–1.2)
Total Protein: 6.2 g/dL — ABNORMAL LOW (ref 6.5–8.1)

## 2017-11-09 LAB — CBC
HCT: 24.6 % — ABNORMAL LOW (ref 39.0–52.0)
Hemoglobin: 8.2 g/dL — ABNORMAL LOW (ref 13.0–17.0)
MCH: 24.1 pg — ABNORMAL LOW (ref 26.0–34.0)
MCHC: 33.3 g/dL (ref 30.0–36.0)
MCV: 72.4 fL — ABNORMAL LOW (ref 78.0–100.0)
Platelets: 551 10*3/uL — ABNORMAL HIGH (ref 150–400)
RBC: 3.4 MIL/uL — ABNORMAL LOW (ref 4.22–5.81)
RDW: 15.8 % — ABNORMAL HIGH (ref 11.5–15.5)
WBC: 15.8 10*3/uL — ABNORMAL HIGH (ref 4.0–10.5)

## 2017-11-09 LAB — TROPONIN I: Troponin I: 0.03 ng/mL (ref <0.03)

## 2017-11-09 LAB — HEPARIN LEVEL (UNFRACTIONATED): HEPARIN UNFRACTIONATED: 0.28 [IU]/mL — AB (ref 0.30–0.70)

## 2017-11-09 LAB — T4, FREE: Free T4: 0.98 ng/dL (ref 0.61–1.12)

## 2017-11-09 LAB — TSH: TSH: 0.271 u[IU]/mL — AB (ref 0.350–4.500)

## 2017-11-09 MED ORDER — BACLOFEN 10 MG PO TABS: 5.0000 mg | ORAL_TABLET | Freq: Once | ORAL | Status: DC | PRN

## 2017-11-09 MED ORDER — VANCOMYCIN HCL IN DEXTROSE 750-5 MG/150ML-% IV SOLN
750.0000 mg | Freq: Two times a day (BID) | INTRAVENOUS | Status: DC
Start: 2017-11-09 — End: 2017-11-11
  Administered 2017-11-09 – 2017-11-11 (×4): 750 mg via INTRAVENOUS
  Filled 2017-11-09 (×4): qty 150

## 2017-11-09 MED ORDER — HEPARIN BOLUS VIA INFUSION
2000.0000 [IU] | Freq: Once | INTRAVENOUS | Status: AC
  Administered 2017-11-09: 2000 [IU] via INTRAVENOUS
  Filled 2017-11-09: qty 2000

## 2017-11-09 MED ORDER — DILTIAZEM HCL 100 MG IV SOLR
5.0000 mg/h | INTRAVENOUS | Status: DC
  Administered 2017-11-09: 5 mg/h via INTRAVENOUS
  Filled 2017-11-09: qty 100

## 2017-11-09 MED ORDER — METOPROLOL TARTRATE 5 MG/5ML IV SOLN
2.5000 mg | Freq: Once | INTRAVENOUS | Status: AC
  Administered 2017-11-09: 2.5 mg via INTRAVENOUS

## 2017-11-09 MED ORDER — METOPROLOL TARTRATE 5 MG/5ML IV SOLN
INTRAVENOUS | Status: AC
  Filled 2017-11-09: qty 5

## 2017-11-09 MED ORDER — METOPROLOL TARTRATE 25 MG PO TABS
25.0000 mg | ORAL_TABLET | Freq: Two times a day (BID) | ORAL | Status: DC
  Administered 2017-11-09 – 2017-11-10 (×4): 25 mg via ORAL
  Filled 2017-11-09 (×4): qty 1

## 2017-11-09 NOTE — Progress Notes (Signed)
ANTICOAGULATION CONSULT NOTE - Follow Up Consult  Pharmacy Consult for heparin Indication: atrial fibrillation  Labs: Recent Labs    11/08/17 1041 11/08/17 2125 11/09/17 0304 11/09/17 0459  HGB 9.0*  --  8.2*  --   HCT 27.5*  --  24.6*  --   PLT 670*  --  551*  --   APTT  --  33  --   --   LABPROT  --  14.2  --   --   INR  --  1.11  --   --   HEPARINUNFRC  --   --   --  <0.10*  CREATININE 1.33*  --  1.27*  --   TROPONINI  --  0.04* 0.03*  --     Assessment: 82yo male undetectable on heparin with initial dosing for Afib.  Goal of Therapy:  Heparin level 0.3-0.7 units/ml   Plan:  Will rebolus with heparin 2000 units and increase gtt by 4 units/kg/hr to 1500 units/hr and check level in [place], PharmD, BCPS  11/09/2017,6:26 AM

## 2017-11-09 NOTE — Progress Notes (Signed)
Paged on call MD heart rate 130-144 sinus tach, Resident at bedside.

## 2017-11-09 NOTE — Discharge Summary (Signed)
[facility] Discharge Summary  Patient name: Roberto Owen Medical record number: [id number] Date of birth: 07-30-1935 Age: 82 y.o. Gender: male Date of Admission: 11/08/2017  Date of Discharge: 11/26/2017 Admitting Physician: Alveda Reasons, MD  Primary Care Provider: Patient, No Pcp Per Consultants: Surgery, Interventional Radiology, Cardiology  Indication for Hospitalization:   Diverticulitis with colonic thickening and inflammation  Discharge Diagnoses/Problem List:   Colorectal Carcinoma T4N1 Diverticulitis with abcess AKI Atrial Fibrillation Loculated Left Pleural Effusion from presumed PNA Hypoalbuminemia due to protein-calorie malnutrition Dehydration Intestinal perforation  Disposition: SNF  Discharge Condition: guarded  Discharge Exam:   Gen: Alert and Oriented x 3, NAD HEENT: Normocephalic, atraumatic, PERRLA, EOMI CV: RRR, no murmurs, normal S1, S2 split, +2 pulses dorsalis pedis bilaterally Resp: CTAB, no wheezing, rales, or rhonchi, comfortable work of breathing Abd: non-distended, non-tender, soft, +bs in all four quadrants; colostomy bag in place, midline abdominal incision non-erythematous without drainage, wound vac in place suctioning well MSK: FROM in all four extremities Ext: no clubbing, cyanosis, +1 edema bilateral in LE Skin: warm, dry, intact, no rashes  Brief Hospital Course:   Roberto Owen is an 82y/o male with a PMH of diverticulitis with abdominal abscess s/sp perc drain placement in the left upper quadrant and colonic fistula, PUD, and GI bleed. He presented to the ED after his drain had stopped producing output and he had 4 days of loss of appetite, uncontrolled GI spasms and hiccups, and nausea and vomiting of solid foods. He was diagnosed with diverticulitis with abscess in September of this year and was treated in the hospital and IR put in a drain. He had some difficulty several weeks later with leakage  from around the fistula and a new drain was placed in November of this year. He was doing well until about 4 days before his admission when he developed the symptoms noted above. On admission to the ED he was also found to have new onset Atrial Fibrillation with RVR and Cardiology was consulted. He was started on a diltalizem drip but was poorly controlled so Amiodarone drip was started. He became well controlled on Amiodarone drip and his diltalizem drip was discontinued and he was converted to Amiodraone oral. He was also found to have a left loculated effusion from a presumed PNA infection and IR was consulted and a drain was placed. While receiving oral antibiotics for presumed diverticulitis and with abscess and Left sided PNA he became bradycardic, altered and was found to have pneumoperitoneum on KUB. He was taken for emergent surgery and a Hartmann's procedure was performed. He was given a colostomy and had a colon resection. He was found to have T4N1 colorectal carcinoma after pathology was done a section of the removed colon after surgery. After his surgery he completed a course of Vancomycin (total of 10 days) and Zosyn (total of 14 days).  After surgery he became stable with vitals signs within normal limits. He surgical incision with wound vac in place has continued to heal well with no signs of infection. His chest tube was removed once a repeat CXR revealed he no longer had a left loculated pleural effusion. He has remained in Afib and stayed on oral amiodarone. His chest tube was removed once He had episodes of hypoglycemia that it was determined were due to poor appetite. He passed a swallow study with speech pathology specialists. He was also found to have an AKI which was due to poor oral hydration. He was encouraged  to eat and drink more and both his hypoglycemia improved and his kidney function stabilized.  On the day of discharge Roberto. Piedra had all stable vital signs and no complaints.  His medical exam was unremarkable or concerning for any acute medical concern. He was discharged to the SNF of his choice.  Issues for Follow Up:  1. Roberto Gentzler will need to be seen by his oncologist on 12/10 for the follow up appointment for further management of his colorectal carcinoma. 2. Roberto Haden will also need follow up with his hematologist for anemia and management of his anticoagulation. 3. Roberto Chillemi will need to follow up with his cardiologist for management of his atrial fibrillation and checking labs since he has been on amiodarone. 4. Roberto Dragan will also benefit from short term nursing facility rehab to regain his strength so he can return home and perform ADLs.  Significant Procedures:   Significant Labs and Imaging:  Recent Labs  Lab 11/19/17 0506 11/20/17 0423 11/22/17 0556  WBC 10.1 8.8 9.6  HGB 8.7* 9.3* 8.5*  HCT 27.2* 29.2* 26.6*  PLT 328 298 355   Recent Labs  Lab 11/20/17 0423  11/21/17 0853 11/22/17 0556 11/23/17 0510 11/23/17 1835 11/25/17 0931  NA 144  --  140 140 139 137 139  K 4.0  --  3.8 3.9 4.3 4.1 4.4  CL 122*  --  118* 117* 111 112* 115*  CO2 17*  --  19* 19* 20* 18* 18*  GLUCOSE 79   < > 99 93 82 168* 86  BUN 22*  --  21* 20 20 22* 22*  CREATININE 2.04*  --  2.18* 2.34* 2.46* 2.73* 2.65*  CALCIUM 7.7*  --  7.6* 7.7* 7.9* 7.8* 8.0*  ALKPHOS 51  --   --   --   --   --   --   AST 17  --   --   --   --   --   --   ALT 12*  --   --   --   --   --   --   ALBUMIN 1.2*  --   --   --   --   --   --    < > = values in this interval not displayed.   Ct Abdomen Pelvis Wo Contrast  Result Date: 11/14/2017 CLINICAL DATA:  82 year old male presenting with signs and symptoms suspicious for potential bowel obstruction. EXAM: CT ABDOMEN AND PELVIS WITHOUT CONTRAST TECHNIQUE: Multidetector CT imaging of the abdomen and pelvis was performed following the standard protocol without IV contrast. COMPARISON:  CT the abdomen and pelvis 11/08/2017.  FINDINGS: Lower chest: Pigtail drainage catheter in the lower left hemithorax posteriorly. Small left-sided pleural effusion lying dependently. Trace right pleural effusion lying dependently. Areas of apparent subsegmental atelectasis in the lower lobes of lungs bilaterally. Hepatobiliary: Numerous tiny calcified granulomas. No other definite suspicious appearing hepatic lesions noted throughout the liver on today's noncontrast CT examination. Amorphous intermediate to high attenuation material lying dependently in the gallbladder, presumably biliary sludge. Pancreas: No definite pancreatic mass noted on today's noncontrast CT examination. Small amount of peripancreatic stranding noted, but nonspecific given generalize soft tissues trending throughout the retroperitoneum, mesenteric and diffuse body wall edema. Spleen: Unremarkable. Adrenals/Urinary Tract: Several nonobstructive calculi are noted within the collecting systems of both kidneys, measuring up to 8 mm in the lower pole collecting system of the right kidney. No ureteral stones. No hydroureteronephrosis. Bilateral adrenal glands are normal  in appearance. Large right-sided bladder diverticulum with some high attenuation material lying dependently in this diverticulum, new compared to the prior examination from 11/08/2017, potentially residual contrast material or proteinaceous/hemorrhagic debris. Stomach/Bowel: Unenhanced appearance of the stomach is unremarkable. No pathologic dilatation of small bowel. Colon appears moderately distended with multiple air-fluid levels. Previously noted diverticular abscess with indwelling pigtail drainage catheter in the distal descending colon (axial image 57 of series 3) remains completely decompressed. Soft tissue stranding adjacent to the decompressed abscess is noted, as well as adjacent locules of extraluminal gas (axial image 58 of series 3) which are new compared to the prior study. The appendix is not confidently  identified and may be surgically absent. Regardless, there are no inflammatory changes noted adjacent to the cecum to suggest the presence of an acute appendicitis at this time. Vascular/Lymphatic: Aortic atherosclerosis, without evidence of aneurysm in the abdominal or pelvic vasculature. No lymphadenopathy noted in the abdomen or pelvis on today's noncontrast CT examination. Reproductive: Prostate gland and seminal vesicles are incompletely imaged. Other: New pneumoperitoneum (moderate volume) noted. Trace volume of ascites. Diffuse mesenteric and retroperitoneal edema. Musculoskeletal: Diffuse body wall edema. There are no aggressive appearing lytic or blastic lesions noted in the visualized portions of the skeleton. IMPRESSION: 1. New pneumoperitoneum compared to the prior examination, compatible with perforated viscus. The exact source of this is uncertain, but this is suspected be related to gas traversing the chronic diverticular abscess wall at the site of the indwelling catheter. This was discussed by phone with Dr. Romana Juniper. 2. Diffuse body wall edema, mesenteric edema and retroperitoneal edema with bilateral pleural effusions, suspicious for a state of anasarca. 3. Multiple nonobstructive calculi in the collecting systems of both kidneys measuring up to 8 mm in the lower pole collecting system of the right kidney. There is also some new high attenuation material lying dependently in the large right bladder diverticulum. This could represent multiple tiny calculi, but this is unlikely given the development compared to the recent prior examination. Rather, this is favored to represent some proteinaceous/hemorrhagic debris or residual contrast material from prior CT scan 11/08/2017. 4. Aortic atherosclerosis. 5. Additional findings, as above. Aortic Atherosclerosis (ICD10-I70.0). These results were called by telephone at the time of interpretation on 11/14/2017 at 3:35 pm to Dr. Kae Heller, who verbally  acknowledged these results. Electronically Signed   By: Vinnie Langton M.D.   On: 11/14/2017 15:48   Dg Chest 2 View  Result Date: 11/08/2017 CLINICAL DATA:  Pleural effusion. EXAM: CHEST  2 VIEW COMPARISON:  Radiographs of March 25, 2012. FINDINGS: Stable cardiomediastinal silhouette. No pneumothorax is noted. Right lung is clear. Mild left basilar atelectasis or infiltrate is noted. Bony thorax is unremarkable. IMPRESSION: Mild left basilar subsegmental atelectasis or infiltrate is noted. Followup PA and lateral chest X-ray is recommended in 3-4 weeks following trial of antibiotic therapy to ensure resolution and exclude underlying malignancy. Electronically Signed   By: Marijo Conception, M.D.   On: 11/08/2017 16:46   Ct Head Wo Contrast  Result Date: 11/15/2017 CLINICAL DATA:  82 year old male post abdominal surgery yesterday. Altered level of consciousness. Not waking up. Insert sub EXAM: CT HEAD WITHOUT CONTRAST TECHNIQUE: Contiguous axial images were obtained from the base of the skull through the vertex without intravenous contrast. COMPARISON:  11/14/2017 CT. FINDINGS: Brain: Right convexity 3.9 mm subdural hematoma without change. No significant associated mass effect. No new intracranial hemorrhage seen separate from the above described finding. No CT evidence of large acute infarct  or diffuse anoxia. Atrophy. No intracranial mass lesion noted on this unenhanced exam. Vascular: Vascular calcifications Skull: No acute abnormality Sinuses/Orbits: No acute orbital abnormality. Minimal mucosal thickening ethmoid sinus air cells. Other: Mastoid air cells and middle ear cavities are clear. IMPRESSION: Right convexity 3.9 mm subdural hematoma without change. No significant associated mass effect. No new intracranial hemorrhage. No CT evidence of large acute infarct or diffuse anoxia. Atrophy. Electronically Signed   By: Genia Del M.D.   On: 11/15/2017 14:04   Ct Head Wo Contrast  Result  Date: 11/14/2017 CLINICAL DATA:  82 y/o  M; altered mental status, rule out stroke. EXAM: CT HEAD WITHOUT CONTRAST TECHNIQUE: Contiguous axial images were obtained from the base of the skull through the vertex without intravenous contrast. COMPARISON:  None. FINDINGS: Brain: No evidence of acute infarction, hemorrhage, hydrocephalus or mass lesion/mass effect. Mild chronic microvascular ischemic changes and parenchymal volume loss of the brain for age. 4 mm trace subdural collection in the right cerebral convexity with intermittent attenuation, probably subdural hematoma (series 10, image 39). No significant mass effect. Vascular: Calcific atherosclerosis of carotid siphons. No hyperdense vessel. Skull: Normal. Negative for fracture or focal lesion. Sinuses/Orbits: Mild ethmoid sinus mucosal thickening. Otherwise negative. Other: None. IMPRESSION: 1. Thin subdural collection over right cerebral convexity, likely subdural hematoma. No significant mass effect. 2. No evidence for stroke, brain parenchymal hemorrhage, or mass effect. 3. Mild chronic microvascular ischemic changes and parenchymal volume loss of the brain. Critical Value/emergent results were called by telephone at the time of interpretation on 11/14/2017 at 3:34 pm to Dr. Kae Heller, who verbally acknowledged these results. Electronically Signed   By: Kristine Garbe M.D.   On: 11/14/2017 15:31   Ct Chest W Contrast  Result Date: 11/10/2017 CLINICAL DATA:  Pleural effusions. EXAM: CT CHEST WITH CONTRAST TECHNIQUE: Multidetector CT imaging of the chest was performed during intravenous contrast administration. CONTRAST:  12mL ISOVUE-300 IOPAMIDOL (ISOVUE-300) INJECTION 61% COMPARISON:  Two-view chest x-ray 11/08/2017 FINDINGS: Cardiovascular: The heart is enlarged. Coronary artery calcifications are present. Pulmonary artery is enlarged. The main pulmonary outflow tract measures 3.9 cm. The aorta is normal in size. Mediastinum/Nodes:  Hyperdense material is present within the left hilum. This is greater than expected for calcification appears be some sort of embolic material. Lungs/Pleura: A loculated left lower lobe fusion is present. There is a large portion posteriorly inferiorly. A second loculated portion along the left major fissure. Right-sided pleural fluid is less certain to be loculated. There is significant volume loss in the left greater than right lower lobes. The upper lung fields are clear. Upper Abdomen: Unremarkable. 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 Vertebral body heights are otherwise normal. Rightward curvature of the upper thoracic spine is associated with the butterfly vertebral body. No focal lytic or blastic lesions are present. IMPRESSION: 1. Loculated bilateral pleural effusions, left greater than right. 2. Associated airspace disease likely reflects atelectasis. 3. Additional pleural irregularity on the left may represent infection associated with the fusion. 4. 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 Dextroconvex curvature is associated. Electronically Signed   By: San Morelle M.D.   On: 11/10/2017 15:19   Roberto Brain Wo Contrast  Result Date: 11/16/2017 CLINICAL DATA:  82 y/o M; asymmetry limb movement abnormality of uncertain etiology. History of subdural hematoma. EXAM: MRI HEAD WITHOUT CONTRAST TECHNIQUE: Multiplanar, multiecho pulse sequences of the brain and surrounding structures were obtained without  intravenous contrast. COMPARISON:  11/15/2017 CT head. FINDINGS: Brain: No reduced diffusion to suggest acute or early subacute infarction. Intermediate T1 and high T2 signal acute 3-4 mm subdural hematoma over right cerebral convexity is stable. No significant mass effect. No new intracranial hemorrhage. Mild chronic microvascular ischemic changes of white matter and parenchymal volume loss of  the brain. 4 mm right lateral ventricle nodule along the under belly of corpus callosum with increased T2 and intermediate T1 signal (series 11, image 17 and series 10, image 17). Vascular: Normal flow voids. Skull and upper cervical spine: Normal marrow signal. Sinuses/Orbits: Negative. Other: None. IMPRESSION: 1. No new acute intracranial abnormality. 2. Stable 3-4 mm right cerebral convexity subdural hematoma. 3. Mild chronic microvascular ischemic changes and mild parenchymal volume loss of the brain. 4. 4 mm right lateral ventricle nodule on under belly of corpus callosum, likely benign. Comparison with prior MRI imaging where available is recommended. Otherwise if clinically indicated consider 6-12 month follow-up to evaluate for stability. Electronically Signed   By: Kristine Garbe M.D.   On: 11/16/2017 14:53   Ct Abdomen Pelvis W Contrast  Result Date: 11/08/2017 CLINICAL DATA:  82 year old male. Left lower quadrant abscess secondary to diverticular disease in September with percutaneous drain placement. Subsequent feculent drainage, with fluoro guided injection demonstrating fistula to the colon. Drain occlusion on 10/28/2017. Up size an exchange for a new 14 [place] drain at that time. 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 Recent decreased drainage volume from catheter. Loss of appetite for 2 days with vomiting. EXAM: CT ABDOMEN AND PELVIS WITH CONTRAST TECHNIQUE: Multidetector CT imaging of the abdomen and pelvis was performed using the standard protocol following bolus administration of intravenous contrast. CONTRAST:  168mL ISOVUE-300 IOPAMIDOL (ISOVUE-300) INJECTION 61% COMPARISON:  Drain replacement images [id number]. CT Abdomen and Pelvis 09/30/2017. FINDINGS: Lower chest: New loculated appearing small to moderate size left pleural effusion with simple fluid density (series 3, image 6). Associated left lung base compressive atelectasis. No definite lower lobe or lingula  consolidation. No pericardial effusion.  Stable and negative right lung base. Hepatobiliary: Stable and negative, scattered punctate calcified granulomas. Pancreas: Negative. Spleen: Negative. Adrenals/Urinary Tract: Normal adrenal glands. Bilateral renal enhancement and contrast excretion is symmetric and within normal limits. No hydronephrosis or hydroureter. 5 cm posterior right urinary bladder diverticulum appears unchanged. Stable and negative urinary bladder otherwise. Stomach/Bowel: Stable and negative rectum. Redundant sigmoid colon with persistent circumferential wall thickening in the proximal sigmoid just distal to the left abdominal percutaneous pigtail drain site (series 3, image 56). Adjacent left gutter/mesenteric stranding or trace fluid has not resolved since October and may be mildly increased (series 3, image 58). No extraluminal gas identified. The distal sigmoid appears stable and negative. The descending colon demonstrates increased distention with low-density stool to the splenic flexure. The lumen of the descending colon becomes narrow at the level of the pigtail drain and sigmoid wall thickening. See coronal images 57 52 through 58. Distal descending colon wall thickening. Redundant transverse colon. No retained stool at the splenic flexure. Small volume of fluid in the right colon. Respiratory motion artifact in the right lower quadrant. The cecum is on a lax mesentery. The appendix is not identified today. Nondilated distal small bowel. No dilated small bowel loops. Decompressed stomach and duodenum. No other abdominal free fluid. Vascular/Lymphatic: Mild for age atherosclerosis. Major arterial structures are patent. Portal venous system appears patent. Stable lymph nodes. No lymphadenopathy. Reproductive: Negative. Other: No pelvic free fluid. Musculoskeletal: Stable.  No acute osseous abnormality identified. IMPRESSION: 1. The position of the left abdominal percutaneous pigtail catheter  appears stable since October. No residual abscess but continued and perhaps progressive circumferential colonic wall thickening and mesenteric inflammation near the catheter. 2. Increased low-density stool distending the upstream left colon and splenic flexure raising the possibility of partial obstruction. 3. Consider Colonoscopy: The persistent colonic wall thickening in #1 may be inflammatory but colonic tumor is difficult to exclude. 4. New since October small to moderate size loculated left pleural effusion. Associated lung base atelectasis without definite pneumonia. Electronically Signed   By: Genevie Ann M.D.   On: 11/08/2017 15:30   Ir Catheter Tube Change  Result Date: 10/28/2017 INDICATION: 82 year old male with a history of left lower quadrant abscess secondary to diverticular disease. Drain placed 09/16/2017. The patient has had feculent drainage, with fluoro guided injection demonstrating fistula to the colon. Current drain has become occluded. EXAM: IR CATHETER TUBE CHANGE MEDICATIONS: None ANESTHESIA/SEDATION: None COMPLICATIONS: None PROCEDURE: Informed written consent was obtained from the patient after a thorough discussion of the procedural risks, benefits and alternatives. All questions were addressed. Maximal Sterile Barrier Technique was utilized including caps, mask, sterile gowns, sterile gloves, sterile drape, hand hygiene and skin antiseptic. A timeout was performed prior to the initiation of the procedure. Patient positioned supine position on the fluoroscopy table. The left lower quadrant and drain were prepped and draped in the usual sterile fashion. 1% lidocaine was used for local anesthesia. Attempted drain flush proved blockage of the tube. Small amount contrast entered the cavity, confirming persisting fistula. Modified Seldinger technique was used to place a new 14 [place] drain. Drain was sutured in position. Drain attached to gravity drainage. Patient tolerated the procedure  well and remained hemodynamically stable throughout. No complications were encountered and no significant blood loss. IMPRESSION: Status post upsized and exchange for a new 14 French drain into a abscess cavity of the left lower quadrant. Injection confirms persisting fistula to the colon. Signed, Dulcy Fanny. Earleen Newport, DO [facility] Electronically Signed   By: Corrie Mckusick D.O.   On: 10/28/2017 12:17   US Renal  Result Date: 11/15/2017 CLINICAL DATA:  Acute renal injury, postop exploratory laparotomy. EXAM: RENAL / URINARY TRACT ULTRASOUND COMPLETE COMPARISON:  CT 11/14/2017. FINDINGS: Right Kidney: Length: 12.6 cm. Echogenicity within normal limits. There is an 8 mm calculus in the lower pole. No mass or hydronephrosis visualized. Left Kidney: Length: 11.8 cm. Echogenicity within normal limits. Nonobstructing 8 mm calculus in the left upper pole. No mass or hydronephrosis visualized. Bladder: Decompressed by Foley catheter. Other: Small amount of perihepatic fluid is seen. IMPRESSION: 1. Bilateral nephrolithiasis without obstructive uropathy. Calculi measure on the order of 8 mm each, one seen in the lower the right kidney and the second in the upper pole of left kidney. 2. No loss of cortical-medullary distinction of either kidney. 3. Foley decompressed urinary bladder. 4. Small amount of perihepatic free fluid. Electronically Signed   By: Ashley Royalty M.D.   On: 11/15/2017 00:20   Dg Chest Port 1 View  Result Date: 11/20/2017 CLINICAL DATA:  Evaluation after placement of left basilar chest tube to water seal. Original placement of pigtail chest tube on 11/12/2017 to treat loculated basilar pleural effusion. EXAM: PORTABLE CHEST 1 VIEW COMPARISON:  11/17/2017 FINDINGS: Interval extubation and removal of nasogastric tube. Left basilar chest tube shows stable course and positioning. No significant residual pleural effusion. Mild bibasilar  atelectasis. No pneumothorax.  Stable heart size. IMPRESSION: No pneumothorax. No significant residual left-sided pleural effusion. Electronically Signed   By: Aletta Edouard M.D.   On: 11/20/2017 15:02   Dg Chest Port 1 View  Result Date: 11/17/2017 CLINICAL DATA:  Intubated EXAM: PORTABLE CHEST 1 VIEW COMPARISON:  11/14/2017 FINDINGS: Support devices are stable. Left basilar chest tube remains in place. No pneumothorax. Heart is borderline in size. Left base atelectasis, improving since prior study. IMPRESSION: Stable support devices.  No pneumothorax. Left base atelectasis, improving since prior study. Electronically Signed   By: Rolm Baptise M.D.   On: 11/17/2017 09:01   Portable Chest Xray  Result Date: 11/14/2017 CLINICAL DATA:  Post op bowel resections EXAM: PORTABLE CHEST 1 VIEW COMPARISON:  11/14/2017 FINDINGS: Endotracheal tube tip is about 8.2 cm superior to carina. Esophageal tube tip is below the diaphragm. Left lower chest drainage catheter as before. Small pleural effusions. Mild cardiomegaly. No pneumothorax. Mild atelectasis or focus of infiltrate in the left mid lung. Left lung base consolidation. Left-sided central venous catheter tip overlies the brachiocephalic confluence. No pneumothorax. IMPRESSION: 1. Endotracheal tube tip about 8.2 cm superior to carina 2. Small left effusion with left lower lobe atelectasis or infiltrate, slight worsening. 3. Mild cardiomegaly Electronically Signed   By: Donavan Foil M.D.   On: 11/14/2017 23:03   Dg Chest Port 1 View  Result Date: 11/14/2017 CLINICAL DATA:  82 year old male with altered mental status. EXAM: PORTABLE CHEST 1 VIEW COMPARISON:  11/14/2017. FINDINGS: Cardiomediastinal silhouette enlarged but stable. There is mild pulmonary vascular congestion without frank edema. No focal parenchymal consolidation. Likely small left pleural effusion and associated atelectasis, superimposed consolidation not excluded. No pneumothorax  identified. Pigtail catheter overlies the left upper quadrant. No acute osseous abnormalities. IMPRESSION: Grossly stable appearance of the chest with small left basilar effusion/atelectasis, superimposed consolidation not excluded. Electronically Signed   By: Kristopher Oppenheim M.D.   On: 11/14/2017 13:39   Dg Chest Port 1 View  Result Date: 11/14/2017 CLINICAL DATA:  Left pleural effusion. EXAM: PORTABLE CHEST 1 VIEW COMPARISON:  Radiographs of November 08, 2017. FINDINGS: Stable cardiomediastinal silhouette. No pneumothorax is noted. Right lung is clear. Mild left basilar atelectasis or infiltrate is noted with possible minimal left pleural effusion. Bony thorax is unremarkable. IMPRESSION: Mild left basilar atelectasis or infiltrate is noted with minimal left pleural effusion. Electronically Signed   By: Marijo Conception, M.D.   On: 11/14/2017 07:30   Dg Swallowing Func-speech Pathology  Result Date: 11/19/2017 Objective Swallowing Evaluation: Type of Study: Bedside Swallow Evaluation  Patient Details Name: Jeydan Barner MRN: [id number] Date of Birth: [birth year] Today's Date: 11/19/2017 Time: SLP Start Time (ACUTE ONLY): 1158 -SLP Stop Time (ACUTE ONLY): 1884 SLP Time Calculation (min) (ACUTE ONLY): 16 min Past Medical History: Past Medical History: Diagnosis Date . Anemia  . Bladder diverticulum 11/10/2017 . Esophageal ulcer   hx/notes 09/15/2017 . Gastric ulcer   hx/notes 09/15/2017 . GI bleed  . History of blood transfusion 07/2017 . NSAID induced gastritis  Past Surgical History: Past Surgical History: Procedure Laterality Date . COLONOSCOPY  ~ 05/2016 . ESOPHAGOGASTRODUODENOSCOPY (EGD) WITH PROPOFOL N/A 07/25/2017  Procedure: ESOPHAGOGASTRODUODENOSCOPY (EGD) WITH PROPOFOL;  Surgeon: Mauri Pole, MD;  Location: [facility];  Service: Endoscopy;  Laterality: N/A; . IR CATHETER TUBE CHANGE  10/28/2017 . IR RADIOLOGIST EVAL & MGMT  09/30/2017 . IR RADIOLOGIST EVAL & MGMT  10/19/2017 . LAPAROTOMY N/A  11/14/2017  Procedure: EXPLORATORY LAPAROTOMY WITH HARTMANN PROCEDURE;  Surgeon: Clovis Riley,  MD;  Location: [facility];  Service: General;  Laterality: N/A; HPI: Pt is an 82 year old male with a known peptic ulcer disease, history of GI bleed, diverticulosis who presents with draining around indwelling catheter secondary to diverticulitis with abscess who was brought to the ED with increasing weight loss, GI spasms, cough plus leaking around his diverticular catheter site. Pt now s/p ex lap with partial sigmoid colectomy on 11/25. Of note, pt was intubated from 11/25-11/28.  Subjective: pt alert but not a great historian, provides inconsistent descriptions Assessment / Plan / Recommendation CHL IP CLINICAL IMPRESSIONS 11/19/2017 Clinical Impression -- SLP Visit Diagnosis -- Attention and concentration deficit following -- Frontal lobe and executive function deficit following -- Impact on safety and function (No Data)   CHL IP TREATMENT RECOMMENDATION 11/19/2017 Treatment Recommendations Therapy as outlined in treatment plan below   Prognosis 11/19/2017 Prognosis for Safe Diet Advancement Good Barriers to Reach Goals Cognitive deficits Barriers/Prognosis Comment -- CHL IP DIET RECOMMENDATION 11/19/2017 SLP Diet Recommendations Dysphagia 2 (Fine chop) solids;Thin liquid Liquid Administration via Cup;No straw Medication Administration Whole meds with puree Compensations Small sips/bites;Minimize environmental distractions;Slow rate;Clear throat intermittently Postural Changes Seated upright at 90 degrees   CHL IP OTHER RECOMMENDATIONS 11/19/2017 Recommended Consults -- Oral Care Recommendations Oral care BID Other Recommendations --   CHL IP FOLLOW UP RECOMMENDATIONS 11/19/2017 Follow up Recommendations (No Data)   CHL IP FREQUENCY AND DURATION 11/19/2017 Speech Therapy Frequency (ACUTE ONLY) min 2x/week Treatment Duration 2 weeks      CHL IP ORAL PHASE 11/19/2017 Oral Phase Impaired Oral - Pudding Teaspoon -- Oral -  Pudding Cup -- Oral - Honey Teaspoon -- Oral - Honey Cup -- Oral - Nectar Teaspoon -- Oral - Nectar Cup -- Oral - Nectar Straw -- Oral - Thin Teaspoon -- Oral - Thin Cup Delayed oral transit;Premature spillage Oral - Thin Straw Delayed oral transit Oral - Puree Delayed oral transit Oral - Mech Soft -- Oral - Regular Delayed oral transit Oral - Multi-Consistency -- Oral - Pill -- Oral Phase - Comment --  CHL IP PHARYNGEAL PHASE 11/19/2017 Pharyngeal Phase Impaired Pharyngeal- Pudding Teaspoon -- Pharyngeal -- Pharyngeal- Pudding Cup -- Pharyngeal -- Pharyngeal- Honey Teaspoon -- Pharyngeal -- Pharyngeal- Honey Cup -- Pharyngeal -- Pharyngeal- Nectar Teaspoon -- Pharyngeal -- Pharyngeal- Nectar Cup -- Pharyngeal -- Pharyngeal- Nectar Straw -- Pharyngeal -- Pharyngeal- Thin Teaspoon -- Pharyngeal -- Pharyngeal- Thin Cup Penetration/Aspiration during swallow;Reduced airway/laryngeal closure;Penetration/Aspiration before swallow;Pharyngeal residue - pyriform Pharyngeal Material enters airway, remains ABOVE vocal cords and not ejected out Pharyngeal- Thin Straw Penetration/Aspiration during swallow;Reduced airway/laryngeal closure Pharyngeal Material enters airway, remains ABOVE vocal cords and not ejected out Pharyngeal- Puree WFL Pharyngeal -- Pharyngeal- Mechanical Soft -- Pharyngeal -- Pharyngeal- Regular WFL Pharyngeal -- Pharyngeal- Multi-consistency -- Pharyngeal -- Pharyngeal- Pill -- Pharyngeal -- Pharyngeal Comment --  CHL IP CERVICAL ESOPHAGEAL PHASE 11/19/2017 Cervical Esophageal Phase (No Data) Pudding Teaspoon -- Pudding Cup -- Honey Teaspoon -- Honey Cup -- Nectar Teaspoon -- Nectar Cup -- Nectar Straw -- Thin Teaspoon -- Thin Cup -- Thin Straw -- Puree -- Mechanical Soft -- Regular -- Multi-consistency -- Pill -- Cervical Esophageal Comment -- No flowsheet data found. Houston Siren 11/19/2017, 1:24 PM              Ct Perc Pleural Drain W/[place] Cath W/img Guide  Result Date:  11/12/2017 INDICATION: 82 year old with a loculated left pleural effusion. Plan for CT-guided chest tube placement. EXAM: CT-GUIDED PLACEMENT OF LEFT CHEST TUBE MEDICATIONS: None  ANESTHESIA/SEDATION: Fentanyl 50 mcg IV; Versed 1.0 mg IV Moderate Sedation Time:  24 minutes The patient was continuously monitored during the procedure by the interventional radiology nurse under my direct supervision. COMPLICATIONS: None immediate. PROCEDURE: Informed written consent was obtained from the patient after a thorough discussion of the procedural risks, benefits and alternatives. All questions were addressed. A timeout was performed prior to the initiation of the procedure. Patient was placed prone. CT images through the chest were obtained. Posterior loculated left pleural effusion was targeted. The left side of the back was prepped and draped in sterile fashion. Skin and soft tissues were anesthetized with 1% lidocaine. 18 gauge trocar needle was directed into the pleural space with CT guidance. Clear yellow fluid was aspirated. Stiff Amplatz wire was placed. Tract was dilated to accommodate a 12 [place] multipurpose drain. Drain was advanced over the wire. Clear yellow fluid was aspirated and sent for culture. Catheter was sutured to the skin and attached to a pleural fluid evacuation device. FINDINGS: Loculated effusion along the posterior left lower chest. IMPRESSION: CT-guided placement of a chest tube within the loculated left pleural effusion. Electronically Signed   By: Markus Daft M.D.   On: 11/12/2017 15:10   Results/Tests Pending at Time of Discharge: None  Discharge Medications:  Allergies as of 11/26/2017   No Known Allergies     Medication List    STOP taking these medications   traMADol 50 MG tablet Commonly known as:  ULTRAM     TAKE these medications   acetaminophen 500 MG tablet Commonly known as:  TYLENOL Take 1,000 mg by mouth every 6 (six) hours as needed for mild pain.   ADULT ONE  DAILY GUMMIES Chew Chew 1 tablet by mouth daily.   amiodarone 200 MG tablet Commonly known as:  PACERONE Take 1 tablet (200 mg total) by mouth daily.   apixaban 2.5 MG Tabs tablet Commonly known as:  ELIQUIS Take 1 tablet (2.5 mg total) by mouth 2 (two) times daily.   ferrous sulfate 325 (65 FE) MG tablet Take 1 tablet (325 mg total) by mouth daily with breakfast.   GRIPE WATER Liqd Take 30 mLs every 4 (four) hours as needed by mouth (for gas).   metoprolol tartrate 25 MG tablet Commonly known as:  LOPRESSOR Take 0.5 tablets (12.5 mg total) by mouth 2 (two) times daily.   pantoprazole 40 MG tablet Commonly known as:  PROTONIX TAKE 1 TABLET BY MOUTH TWICE DAILY   ramelteon 8 MG tablet Commonly known as:  ROZEREM Take 1 tablet (8 mg total) by mouth at bedtime.   Simethicone 180 MG Caps Take 1 capsule every 4 (four) hours as needed by mouth (for gas, bloating).       Discharge Instructions: Please refer to Patient Instructions section of EMR for full details.  Patient was counseled important signs and symptoms that should prompt return to medical care, changes in medications, dietary instructions, activity restrictions, and follow up appointments.   Follow-Up Appointments:   Nuala Alpha, DO 11/09/2017, 3:54 PM PGY-1, [place]

## 2017-11-09 NOTE — Progress Notes (Signed)
Family Medicine Teaching Service Daily Progress Note Intern Pager: [phone]  Patient name: Roberto Owen Medical record number: [id number] Date of birth: July 20, 1935 Age: 82 y.o. Gender: male  Primary Care Provider: Patient, No Pcp Per Consultants: Surgery, IR, Cardiology Code Status: FULL  Pt Overview and Major Events to Date:  11/19 - admitted for GI spasms, LOA IR placed abdominal PERC drain for abscess  Assessment and Plan: Roberto Owen is a 82 y.o. male presenting with an in place abdominal drain due to previous episode of diverticulitis with abscess of the sigmoid colon and subsequent development of colonic fistula. PMH is significant for Peptic ulcer disease, history of GI bleed and previous episode of diverticulosis.  Loss of Appetite/GI spasms: Presents with LOA, hiccups, vomiting, spasms x5 days. Recently had diverticular abscess s/p CT guided drain on 09/16/17. On 10/11 another CT was done to assess progress and it was found that he developed 2 complex fistulas. On 10/30 he was seen by IR again and fistula again was seen. 11/8 had placement of larger PERC drain due to fecal drainage around catheter site. H/o NSAID use with resultant PUD, acute blood loss anemia with blood transfusion recently S/p 1 L NS bolus in ED no on maintenance fluids. WBC 15.3>15.8  Differential includes diverticulitis vs abscess vs perforation vs obstruction. No acute abdomen on exam so would not expect a perforated colon. Diverticulitis recurrence with/without perforated abscess is most likely but would expect more positive findings on physical exam. Obstruction is unlikely given he has been having regular bowel movements and no vomiting.  CT abdomen and pelvis shows persistent colonic wall thickening.  - NPO bowel rest, sips with meds and ice chips - CT Abd/Pelvis scan in am - Vanc/Zosyn dosed per pharmacy - Gripe water to help with GI spasms - Surgery consulted and has seen. Recommended bowel rest and  awaiting results of CT scan. Appreciate recs. - IR consulted and will reassess abdominal drain on patient. - NS IVF 128ml/hr - baclofen PRN spasm - tylenol PRN - zofran PRN  Loculated Left Pleural Effusion: Found on CT Abd and suspicous for PNA. Patient is afebrile but has productive cough with whitish phlegm. No difficulty breathing except when having GI spasms but is having desats into the mid 80s while in the ED. Currently still on room air. - CVTS consult, appreciate recs - Continue Vanc/Zosyn for HCAP coverage - Continuous pulse ox - Spiometry q1hr  New Onset Afib: New onset, with RVR in ED s/p Cardizem bolus and drip with no significant rate control, subsequently started on amiodarone per Cardiology. He has no previous cardiac history.  Possibly precipitated by infection. Trop 0.01>0.04>0.03. TSH 0.271, low. Electrolytes are normal. No chest pain. Chads vasc score of 2. Hasbled score of 1. Has a history of peptic ulcer disease.  Will defer to cardiology for anticoagulation. Blood pressure stable.  - TSH low at 0.271, get T3 and T4 - Trend Troponin: 0.03 - Started on Heparin - Continue amiodarone, diltiazem per Cards - Echo - Magnesium level  AKI: Creatinine 1.33>1.27, baseline  ~0.8.  BUN creatinine is greater than 20 indicating prerenal.  Likely due to decreased oral intake.  S/p 1 L normal saline bolus in the ED. - Normal saline at 175 cc/h - BMP tomorrow am  Hyponatremia: Sodium of 129>131.  Likely due to dehydration. - IV fluids as noted above  Thrombocytosis: Platelets of 670.  It is not chronic.  Likely due to acute infection. - Monitor   Anemia: Hemoglobin 9  which is at baseline.  No signs of overt bleeding. - Monitor with labs  FEN/GI: Protonix, NPO sips with meds and ice chips  Prophylaxis: Heparin  Disposition: continue inpatient management of GI spasms  Subjective:  Patient states he was able to have some giner ale and gripe water which helped his GI  abdominal spasms and hiccups. He still has no abdominal pain and states he "feels better than yesterday".  Objective: Temp:  [97.5 F (36.4 C)-99.7 F (37.6 C)] 99.7 F (37.6 C) (11/19 2117) Pulse Rate:  [44-156] 71 (11/20 0930) Resp:  [12-29] 19 (11/20 0930) BP: (102-142)/(50-93) 124/81 (11/20 0930) SpO2:  [80 %-100 %] 100 % (11/20 0930) Weight:  [206 lb (93.4 kg)] 206 lb (93.4 kg) (11/19 1034)  Physical Exam: Gen: Alert and Oriented x 3, NAD HEENT: Normocephalic, atraumatic, PERRLA, EOMI Neck: trachea midline, no thyroidmegaly, no LAD CV: Irregular rhythm, tachycardic, no murmurs, normal S1, S2 split, +2 pulses dorsalis pedis bilaterally Resp: decreased breath sounds bilaterally, CTAB, no wheezing, rales, or rhonchi, comfortable work of breathing Abd: non-distended, non-tender, soft, +bs in all four quadrants, no hepatomegaly MSK: FROM in all four extremities Ext: no clubbing, cyanosis, or edema Skin: warm, dry, intact, no rashes  Laboratory: Recent Labs  Lab 11/08/17 1041 11/09/17 0304  WBC 15.3* 15.8*  HGB 9.0* 8.2*  HCT 27.5* 24.6*  PLT 670* 551*   Recent Labs  Lab 11/08/17 1041 11/09/17 0304  NA 129* 131*  K 4.2 3.9  CL 96* 101  CO2 24 21*  BUN 39* 28*  CREATININE 1.33* 1.27*  CALCIUM 8.0* 7.5*  PROT 7.2 6.2*  BILITOT 0.5 0.4  ALKPHOS 92 73  ALT 14* 12*  AST 27 22  GLUCOSE 108* 106*   11/19 - Lipase: 19 11/19 - Lactic Acid: 1.52, 1.15 11/20 - Troponin I: 0.03,  11/20 - TSH: 0.271 (low)  Imaging/Diagnostic Tests:  11/19 - CXR:  IMPRESSION: Mild left basilar subsegmental atelectasis or infiltrate is noted. Followup PA and lateral chest X-ray is recommended in 3-4 weeks following trial of antibiotic therapy to ensure resolution and exclude underlying malignancy.  11/19 - CT Abd: IMPRESSION: 1. The position of the left abdominal percutaneous pigtail catheter appears stable since October. No residual abscess but continued and perhaps progressive  circumferential colonic wall thickening and mesenteric inflammation near the catheter. 2. Increased low-density stool distending the upstream left colon and splenic flexure raising the possibility of partial obstruction.  3. Consider Colonoscopy: The persistent colonic wall thickening in #1 may be inflammatory but colonic tumor is difficult to exclude. 4. New since October small to moderate size loculated left pleural effusion. Associated lung base atelectasis without definite pneumonia.   Nuala Alpha, DO 11/09/2017, 10:02 AM PGY-1, [place] Intern pager: [phone], text pages welcome

## 2017-11-09 NOTE — ED Notes (Signed)
Attempted to call report to 70m.

## 2017-11-09 NOTE — Progress Notes (Signed)
Subjective:  Patient denies any chest pain or shortness of breath denies any palpitation. Remains in atrial flutter with moderate ventricular response.  Objective:  Vital Signs in the last 24 hours: Temp:  [99.7 F (37.6 C)] 99.7 F (37.6 C) (11/19 2117) Pulse Rate:  [44-156] 72 (11/20 1015) Resp:  [12-29] 17 (11/20 1100) BP: (100-142)/(50-93) 126/71 (11/20 1100) SpO2:  [80 %-100 %] 100 % (11/20 1015)  Intake/Output from previous day: 11/19 0701 - 11/20 0700 In: 1650 [I.V.:100; IV Piggyback:1550] Out: -  Intake/Output from this shift: Total I/O In: 1050 [I.V.:1000; IV Piggyback:50] Out: -   Physical Exam: Neck: no adenopathy, no carotid bruit, no JVD and supple, symmetrical, trachea midline Lungs: decreased breath sound at bases Heart: regularly irregular rhythm, S1, S2 normal and soft systolic murmur noted Abdomen: soft faint bowel sounds noted mild generalized tenderness Extremities: extremities normal, atraumatic, no cyanosis or edema  Lab Results: Recent Labs    11/08/17 1041 11/09/17 0304  WBC 15.3* 15.8*  HGB 9.0* 8.2*  PLT 670* 551*   Recent Labs    11/08/17 1041 11/09/17 0304  NA 129* 131*  K 4.2 3.9  CL 96* 101  CO2 24 21*  GLUCOSE 108* 106*  BUN 39* 28*  CREATININE 1.33* 1.27*   Recent Labs    11/08/17 2125 11/09/17 0304  TROPONINI 0.04* 0.03*   Hepatic Function Panel Recent Labs    11/09/17 0304  PROT 6.2*  ALBUMIN 1.5*  AST 22  ALT 12*  ALKPHOS 73  BILITOT 0.4   No results for input(s): CHOL in the last 72 hours. No results for input(s): PROTIME in the last 72 hours.  Imaging: Dg Chest 2 View  Result Date: 11/08/2017 CLINICAL DATA:  Pleural effusion. EXAM: CHEST  2 VIEW COMPARISON:  Radiographs of March 25, 2012. FINDINGS: Stable cardiomediastinal silhouette. No pneumothorax is noted. Right lung is clear. Mild left basilar atelectasis or infiltrate is noted. Bony thorax is unremarkable. IMPRESSION: Mild left basilar subsegmental  atelectasis or infiltrate is noted. Followup PA and lateral chest X-ray is recommended in 3-4 weeks following trial of antibiotic therapy to ensure resolution and exclude underlying malignancy. Electronically Signed   By: Marijo Conception, M.D.   On: 11/08/2017 16:46   Ct Abdomen Pelvis W Contrast  Result Date: 11/08/2017 CLINICAL DATA:  82 year old male. Left lower quadrant abscess secondary to diverticular disease in September with percutaneous drain placement. Subsequent feculent drainage, with fluoro guided injection demonstrating fistula to the colon. Drain occlusion on 10/28/2017. Up size an exchange for a new 14 [place] drain at that time. 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 Recent decreased drainage volume from catheter. Loss of appetite for 2 days with vomiting. EXAM: CT ABDOMEN AND PELVIS WITH CONTRAST TECHNIQUE: Multidetector CT imaging of the abdomen and pelvis was performed using the standard protocol following bolus administration of intravenous contrast. CONTRAST:  181mL ISOVUE-300 IOPAMIDOL (ISOVUE-300) INJECTION 61% COMPARISON:  Drain replacement images [id number]. CT Abdomen and Pelvis 09/30/2017. FINDINGS: Lower chest: New loculated appearing small to moderate size left pleural effusion with simple fluid density (series 3, image 6). Associated left lung base compressive atelectasis. No definite lower lobe or lingula consolidation. No pericardial effusion.  Stable and negative right lung base. Hepatobiliary: Stable and negative, scattered punctate calcified granulomas. Pancreas: Negative. Spleen: Negative. Adrenals/Urinary Tract: Normal adrenal glands. Bilateral renal enhancement and contrast excretion is symmetric and within normal limits. No hydronephrosis or hydroureter. 5 cm posterior right urinary bladder diverticulum appears unchanged. Stable and  negative urinary bladder otherwise. Stomach/Bowel: Stable and negative rectum. Redundant sigmoid colon with persistent circumferential wall  thickening in the proximal sigmoid just distal to the left abdominal percutaneous pigtail drain site (series 3, image 56). Adjacent left gutter/mesenteric stranding or trace fluid has not resolved since October and may be mildly increased (series 3, image 58). No extraluminal gas identified. The distal sigmoid appears stable and negative. The descending colon demonstrates increased distention with low-density stool to the splenic flexure. The lumen of the descending colon becomes narrow at the level of the pigtail drain and sigmoid wall thickening. See coronal images 57 52 through 58. Distal descending colon wall thickening. Redundant transverse colon. No retained stool at the splenic flexure. Small volume of fluid in the right colon. Respiratory motion artifact in the right lower quadrant. The cecum is on a lax mesentery. The appendix is not identified today. Nondilated distal small bowel. No dilated small bowel loops. Decompressed stomach and duodenum. No other abdominal free fluid. Vascular/Lymphatic: Mild for age atherosclerosis. Major arterial structures are patent. Portal venous system appears patent. Stable lymph nodes. No lymphadenopathy. Reproductive: Negative. Other: No pelvic free fluid. Musculoskeletal: Stable.  No acute osseous abnormality identified. IMPRESSION: 1. The position of the left abdominal percutaneous pigtail catheter appears stable since October. No residual abscess but continued and perhaps progressive circumferential colonic wall thickening and mesenteric inflammation near the catheter. 2. Increased low-density stool distending the upstream left colon and splenic flexure raising the possibility of partial obstruction. 3. Consider Colonoscopy: The persistent colonic wall thickening in #1 may be inflammatory but colonic tumor is difficult to exclude. 4. New since October small to moderate size loculated left pleural effusion. Associated lung base atelectasis without definite pneumonia.  Electronically Signed   By: Genevie Ann M.D.   On: 11/08/2017 15:30    Cardiac Studies:  Assessment/Plan:  New onset A. fib/flutter with RVR chads score of 2 Hypertension Colonic diverticulitis and partial colon obstruction History of peptic ulcer disease secondary to NSAIDs History of GI bleed in the past History of hematuria in the past Chronic anemia secondary to above Plan DC Cardizem drip Add low-dose Lopressor as per orders Consider GI evaluation regarding continuation of chronic anticoagulation   LOS: 1 day    Charolette Forward 11/09/2017, 11:19 AM

## 2017-11-09 NOTE — Progress Notes (Signed)
ANTICOAGULATION CONSULT NOTE - Follow Up Consult  Pharmacy Consult for Heparin  Indication: atrial fibrillation  No Known Allergies  Patient Measurements: Height: 6\' 6"  (198.1 cm) Weight: 192 lb 10.9 oz (87.4 kg) IBW/kg (Calculated) : 91.4    Vital Signs: Temp: 98 F (36.7 C) (11/20 1312) Temp Source: Oral (11/20 1312) BP: 125/80 (11/20 1312) Pulse Rate: 142 (11/20 1312)  Labs: Recent Labs    11/08/17 1041 11/08/17 2125 11/09/17 0304 11/09/17 0459 11/09/17 1256  HGB 9.0*  --  8.2*  --   --   HCT 27.5*  --  24.6*  --   --   PLT 670*  --  551*  --   --   APTT  --  33  --   --   --   LABPROT  --  14.2  --   --   --   INR  --  1.11  --   --   --   HEPARINUNFRC  --   --   --  <0.10* 0.28*  CREATININE 1.33*  --  1.27*  --   --   TROPONINI  --  0.04* 0.03*  --   --     Estimated Creatinine Clearance: 57.3 mL/min (A) (by C-G formula based on SCr of 1.27 mg/dL (H)).   Medical History: Past Medical History:  Diagnosis Date  . Anemia   . Esophageal ulcer    hx/notes 09/15/2017  . Gastric ulcer    hx/notes 09/15/2017  . GI bleed   . History of blood transfusion 07/2017  . NSAID induced gastritis       Assessment: 82yom admitted with abdominal pain found to be in Afib RVR 150s.  Diltiazem started for rate control > changed to metoprolol and amiodarone.   Heparin started for anticoagulation.  H/H low stable, PLTC 600, no bleeding noted, but with Hx GIB will dose conservatively.   Heparin drip 1500 uts/hr HL 0.28 at low end of range will increase slightly.     Goal of Therapy:  Heparin level 0.3-0.7 units/ml Monitor platelets by anticoagulation protocol: Yes   Plan:  Increase Heparin drip 1600 uts/hr Daily HL, CBC   Bonnita Nasuti Pharm.D. CPP, BCPS Clinical Pharmacist [phone] 11/09/2017 2:50 PM

## 2017-11-09 NOTE — ED Notes (Signed)
Cardizem drip discontinued per admitting MD - HR= 56/min.

## 2017-11-09 NOTE — Care Management Note (Signed)
Case Management Note  Patient Details  Name: Breylin Dom MRN: [id number] Date of Birth: 04-07-1935  Subjective/Objective:  From home with daughter Donella Stade, she works but he states he is indep at  Home he has been in [place] for a year now (from [place]) , presents with afib/flutter with RVR, htn, colonic diverticulitis and partial colon obstruction, will dc cardizem drip, add lopressor, consider GI eval.  He does not use any assistive devices at home, he does not have a PCP, but he states his daughter Delorse Limber is working on getting him a PCP.                   Action/Plan: NCM will follow for dc needs.   Expected Discharge Date:                  Expected Discharge Plan:     In-House Referral:     Discharge planning Services  CM Consult  Post Acute Care Choice:    Choice offered to:     DME Arranged:    DME Agency:     HH Arranged:    HH Agency:     Status of Service:  In process, will continue to follow  If discussed at Long Length of Stay Meetings, dates discussed:    Additional Comments:  Zenon Mayo, RN 11/09/2017, 4:31 PM

## 2017-11-09 NOTE — Progress Notes (Signed)
Spoke with Dr. Servando Snare of CV[facility]. He reviewed imaging and says loculated effusion could not be confirmed without full imaging of chest. He also did not recommend pursuing drainage unless patient was septic. He said IR would be the team to discuss drainage with, as area would be accessible by pigtail cath. Will plan to order chest CT when kidney function improves to better characterize this area.   Olene Floss, MD [place], PGY-3

## 2017-11-09 NOTE — Progress Notes (Signed)
Patient known to radiology department from diverticular abscess drain placement 09/16/17.  Patient has been followed in IR outpatient drain clinic for ongoing management.  He did have a demonstrated fistulous connection on drain injection 10/19/17.  He presented to [facility] for drain upsize and exchange on 10/28/17.  Patient now admitted with abdominal pain.  PA to bedside for evaluation of his drain.  There is little to no output/fluid from the drain into the gravity bag.  He has gas and feculent output from around the catheter that is dark and thick.  Limited role for drain injection given proven fistula and current output from around drain.  Discussed case with Dr. Kathlene Cote who would not recommend upsizing the drain at this time.  The prior abscess has resolved, however fistula not currently well-controlled with current method.   Brynda Greathouse, MS RD PA-C 4:43 PM

## 2017-11-09 NOTE — Progress Notes (Addendum)
Subjective: Says he feels better and has no pain.  No nausea.  Has some hiccups. On heparin drip, Cardizem, amiodarone per cardiology.  Heart rate improved.  No chest pain or dyspnea Spoke with IR.  They're going to take a look at his drain today. Labs revealed WBC 15,800, hemoglobin 8.2, potassium 3.9, creatinine 1.27.  Objective: Vital signs in last 24 hours: Temp:  [97.5 F (36.4 C)-99.7 F (37.6 C)] 99.7 F (37.6 C) (11/19 2117) Pulse Rate:  [44-156] 129 (11/20 0745) Resp:  [12-29] 21 (11/20 0745) BP: (102-142)/(50-93) 111/68 (11/20 0745) SpO2:  [80 %-100 %] 100 % (11/20 0745) Weight:  [93.4 kg (206 lb)] 93.4 kg (206 lb) (11/19 1034)    Intake/Output from previous day: 11/19 0701 - 11/20 0700 In: 5170 [I.V.:100; IV Piggyback:1550] Out: -  Intake/Output this shift: No intake/output data recorded.  General appearance: Alert.  Cooperative.  Thin, older gentleman in no distress.  Mental status normal Stoic. Resp: Somewhat decreased breath sounds at bases GI: Soft.  Nontender.  Nondistended.  No mass.  Drain LLQ.  Nontender around drain site.  Lab Results:  Recent Labs    11/08/17 1041 11/09/17 0304  WBC 15.3* 15.8*  HGB 9.0* 8.2*  HCT 27.5* 24.6*  PLT 670* 551*   BMET Recent Labs    11/08/17 1041 11/09/17 0304  NA 129* 131*  K 4.2 3.9  CL 96* 101  CO2 24 21*  GLUCOSE 108* 106*  BUN 39* 28*  CREATININE 1.33* 1.27*  CALCIUM 8.0* 7.5*   PT/INR Recent Labs    11/08/17 2125  LABPROT 14.2  INR 1.11   ABG No results for input(s): PHART, HCO3 in the last 72 hours.  Invalid input(s): PCO2, PO2  Studies/Results: Dg Chest 2 View  Result Date: 11/08/2017 CLINICAL DATA:  Pleural effusion. EXAM: CHEST  2 VIEW COMPARISON:  Radiographs of March 25, 2012. FINDINGS: Stable cardiomediastinal silhouette. No pneumothorax is noted. Right lung is clear. Mild left basilar atelectasis or infiltrate is noted. Bony thorax is unremarkable. IMPRESSION: Mild left basilar  subsegmental atelectasis or infiltrate is noted. Followup PA and lateral chest X-ray is recommended in 3-4 weeks following trial of antibiotic therapy to ensure resolution and exclude underlying malignancy. Electronically Signed   By: Marijo Conception, M.D.   On: 11/08/2017 16:46   Ct Abdomen Pelvis W Contrast  Result Date: 11/08/2017 CLINICAL DATA:  82 year old male. Left lower quadrant abscess secondary to diverticular disease in September with percutaneous drain placement. Subsequent feculent drainage, with fluoro guided injection demonstrating fistula to the colon. Drain occlusion on 10/28/2017. Up size an exchange for a new 14 [place] drain at that time. 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 Recent decreased drainage volume from catheter. Loss of appetite for 2 days with vomiting. EXAM: CT ABDOMEN AND PELVIS WITH CONTRAST TECHNIQUE: Multidetector CT imaging of the abdomen and pelvis was performed using the standard protocol following bolus administration of intravenous contrast. CONTRAST:  179mL ISOVUE-300 IOPAMIDOL (ISOVUE-300) INJECTION 61% COMPARISON:  Drain replacement images [id number]. CT Abdomen and Pelvis 09/30/2017. FINDINGS: Lower chest: New loculated appearing small to moderate size left pleural effusion with simple fluid density (series 3, image 6). Associated left lung base compressive atelectasis. No definite lower lobe or lingula consolidation. No pericardial effusion.  Stable and negative right lung base. Hepatobiliary: Stable and negative, scattered punctate calcified granulomas. Pancreas: Negative. Spleen: Negative. Adrenals/Urinary Tract: Normal adrenal glands. Bilateral renal enhancement and contrast excretion is symmetric and within normal limits. No hydronephrosis or  hydroureter. 5 cm posterior right urinary bladder diverticulum appears unchanged. Stable and negative urinary bladder otherwise. Stomach/Bowel: Stable and negative rectum. Redundant sigmoid colon with persistent  circumferential wall thickening in the proximal sigmoid just distal to the left abdominal percutaneous pigtail drain site (series 3, image 56). Adjacent left gutter/mesenteric stranding or trace fluid has not resolved since October and may be mildly increased (series 3, image 58). No extraluminal gas identified. The distal sigmoid appears stable and negative. The descending colon demonstrates increased distention with low-density stool to the splenic flexure. The lumen of the descending colon becomes narrow at the level of the pigtail drain and sigmoid wall thickening. See coronal images 57 52 through 58. Distal descending colon wall thickening. Redundant transverse colon. No retained stool at the splenic flexure. Small volume of fluid in the right colon. Respiratory motion artifact in the right lower quadrant. The cecum is on a lax mesentery. The appendix is not identified today. Nondilated distal small bowel. No dilated small bowel loops. Decompressed stomach and duodenum. No other abdominal free fluid. Vascular/Lymphatic: Mild for age atherosclerosis. Major arterial structures are patent. Portal venous system appears patent. Stable lymph nodes. No lymphadenopathy. Reproductive: Negative. Other: No pelvic free fluid. Musculoskeletal: Stable.  No acute osseous abnormality identified. IMPRESSION: 1. The position of the left abdominal percutaneous pigtail catheter appears stable since October. No residual abscess but continued and perhaps progressive circumferential colonic wall thickening and mesenteric inflammation near the catheter. 2. Increased low-density stool distending the upstream left colon and splenic flexure raising the possibility of partial obstruction. 3. Consider Colonoscopy: The persistent colonic wall thickening in #1 may be inflammatory but colonic tumor is difficult to exclude. 4. New since October small to moderate size loculated left pleural effusion. Associated lung base atelectasis without  definite pneumonia. Electronically Signed   By: Genevie Ann M.D.   On: 11/08/2017 15:30    Anti-infectives: Anti-infectives (From admission, onward)   Start     Dose/Rate Route Frequency Ordered Stop   11/09/17 0800  vancomycin (VANCOCIN) IVPB 750 mg/150 ml premix     750 mg 150 mL/hr over 60 Minutes Intravenous Every 12 hours 11/08/17 2152     11/09/17 0200  piperacillin-tazobactam (ZOSYN) IVPB 3.375 g     3.375 g 12.5 mL/hr over 240 Minutes Intravenous Every 8 hours 11/08/17 1821     11/08/17 2200  vancomycin (VANCOCIN) 2,000 mg in sodium chloride 0.9 % 500 mL IVPB     2,000 mg 250 mL/hr over 120 Minutes Intravenous  Once 11/08/17 2152 11/09/17 0020   11/08/17 1830  piperacillin-tazobactam (ZOSYN) IVPB 3.375 g     3.375 g 100 mL/hr over 30 Minutes Intravenous  Once 11/08/17 1821 11/08/17 2134   11/08/17 1745  piperacillin-tazobactam (ZOSYN) IVPB 3.375 g  Status:  Discontinued     3.375 g 100 mL/hr over 30 Minutes Intravenous  Once 11/08/17 1733 11/08/17 1734      Assessment/Plan:    Sigmoid colon thickening and inflammation.  It is presumed that this is acute diverticulitis with perforation.  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  Radiology is concerned about the thickening in the colon and the possibility of neoplasia as a secondary possibility. -Admit to internal medicine because of tachycardia....EDP to contact [facility]. -IV antibiotics. -Bowel rest.  Nothing by mouth except meds and ice chips - IR to see today to check drain..  Not sure whether upsizing the drain has any relationship to his new symptoms, although  temporarily they are related -Ideally, the inflammation will settle down, the fistula will close, allowing outpatient workup with colonoscopy and elective one stage resection -If he does not respond he may require laparotomy, sigmoid colectomy and colostomy.  He and his family are aware of this possibility -He reaffirms that he had a normal  colonoscopy in [place] one year ago.  We will try to get that report.  Tachycardia-new-onset atrial fibrillation and flutter with RVR.   On Cardizem drip.  Amiodarone.  Heparin drip.    await  stepdown unit.  Loculated left pleural effusion.  Possibly the cause of his hiccups.  History peptic ulcer disease by endoscopy - protonix History anemia requiring transfusion     LOS: 1 day    Roberto Owen 11/09/2017

## 2017-11-10 ENCOUNTER — Encounter (HOSPITAL_COMMUNITY): Payer: Self-pay | Admitting: Family Medicine

## 2017-11-10 ENCOUNTER — Inpatient Hospital Stay (HOSPITAL_COMMUNITY): Payer: Medicare Other

## 2017-11-10 DIAGNOSIS — D473 Essential (hemorrhagic) thrombocythemia: Secondary | ICD-10-CM | POA: Diagnosis present

## 2017-11-10 DIAGNOSIS — R066 Hiccough: Secondary | ICD-10-CM | POA: Diagnosis present

## 2017-11-10 DIAGNOSIS — D75839 Thrombocytosis, unspecified: Secondary | ICD-10-CM | POA: Diagnosis present

## 2017-11-10 DIAGNOSIS — N323 Diverticulum of bladder: Secondary | ICD-10-CM

## 2017-11-10 DIAGNOSIS — K56609 Unspecified intestinal obstruction, unspecified as to partial versus complete obstruction: Secondary | ICD-10-CM | POA: Diagnosis present

## 2017-11-10 DIAGNOSIS — R3129 Other microscopic hematuria: Secondary | ICD-10-CM | POA: Diagnosis present

## 2017-11-10 DIAGNOSIS — E8809 Other disorders of plasma-protein metabolism, not elsewhere classified: Secondary | ICD-10-CM | POA: Diagnosis present

## 2017-11-10 DIAGNOSIS — E46 Unspecified protein-calorie malnutrition: Secondary | ICD-10-CM | POA: Diagnosis present

## 2017-11-10 DIAGNOSIS — E0781 Sick-euthyroid syndrome: Secondary | ICD-10-CM | POA: Diagnosis present

## 2017-11-10 HISTORY — DX: Diverticulum of bladder: N32.3

## 2017-11-10 LAB — BASIC METABOLIC PANEL
ANION GAP: 5 (ref 5–15)
BUN: 18 mg/dL (ref 6–20)
CALCIUM: 7.4 mg/dL — AB (ref 8.9–10.3)
CHLORIDE: 101 mmol/L (ref 101–111)
CO2: 25 mmol/L (ref 22–32)
CREATININE: 1.02 mg/dL (ref 0.61–1.24)
GFR calc non Af Amer: >60 mL/min (ref >60)
Glucose, Bld: 108 mg/dL — ABNORMAL HIGH (ref 65–99)
Potassium: 3.5 mmol/L (ref 3.5–5.1)
SODIUM: 131 mmol/L — AB (ref 135–145)

## 2017-11-10 LAB — ECHOCARDIOGRAM COMPLETE
Height: 78 in
Weight: 3082.91 oz

## 2017-11-10 LAB — T3, FREE: T3, Free: 0.9 pg/mL — ABNORMAL LOW (ref 2.0–4.4)

## 2017-11-10 LAB — CBC
HEMATOCRIT: 24.7 % — AB (ref 39.0–52.0)
HEMOGLOBIN: 8.1 g/dL — AB (ref 13.0–17.0)
MCH: 24 pg — ABNORMAL LOW (ref 26.0–34.0)
MCHC: 32.8 g/dL (ref 30.0–36.0)
MCV: 73.1 fL — ABNORMAL LOW (ref 78.0–100.0)
Platelets: 599 10*3/uL — ABNORMAL HIGH (ref 150–400)
RBC: 3.38 MIL/uL — ABNORMAL LOW (ref 4.22–5.81)
RDW: 15.9 % — ABNORMAL HIGH (ref 11.5–15.5)
WBC: 12.6 10*3/uL — AB (ref 4.0–10.5)

## 2017-11-10 LAB — HEPARIN LEVEL (UNFRACTIONATED): HEPARIN UNFRACTIONATED: 0.3 [IU]/mL (ref 0.30–0.70)

## 2017-11-10 MED ORDER — METOPROLOL TARTRATE 5 MG/5ML IV SOLN
2.5000 mg | Freq: Once | INTRAVENOUS | Status: AC
  Administered 2017-11-10: 2.5 mg via INTRAVENOUS
  Filled 2017-11-10: qty 5

## 2017-11-10 MED ORDER — AMIODARONE IV BOLUS ONLY 150 MG/100ML
150.0000 mg | Freq: Once | INTRAVENOUS | Status: AC
  Administered 2017-11-10: 150 mg via INTRAVENOUS
  Filled 2017-11-10: qty 100

## 2017-11-10 MED ORDER — IOPAMIDOL (ISOVUE-300) INJECTION 61%
INTRAVENOUS | Status: AC
  Administered 2017-11-10: 75 mL
  Filled 2017-11-10: qty 75

## 2017-11-10 MED ORDER — PERFLUTREN LIPID MICROSPHERE
1.0000 mL | INTRAVENOUS | Status: AC | PRN
  Administered 2017-11-10: 2 mL via INTRAVENOUS
  Filled 2017-11-10: qty 10

## 2017-11-10 MED ORDER — AMIODARONE HCL 200 MG PO TABS
200.0000 mg | ORAL_TABLET | Freq: Two times a day (BID) | ORAL | Status: DC
  Administered 2017-11-10 – 2017-11-14 (×8): 200 mg via ORAL
  Filled 2017-11-10 (×11): qty 1

## 2017-11-10 MED ORDER — SODIUM CHLORIDE 0.9 % IV BOLUS (SEPSIS)
250.0000 mL | Freq: Once | INTRAVENOUS | Status: AC
  Administered 2017-11-10: 250 mL via INTRAVENOUS

## 2017-11-10 MED ORDER — PERFLUTREN LIPID MICROSPHERE
INTRAVENOUS | Status: AC
  Administered 2017-11-10: 14:00:00
  Filled 2017-11-10: qty 10

## 2017-11-10 NOTE — Progress Notes (Signed)
ANTICOAGULATION CONSULT NOTE - Follow Up Consult  Pharmacy Consult for Heparin Indication: atrial fibrillation  No Known Allergies  Patient Measurements: Height: 6\' 6"  (198.1 cm) Weight: 192 lb 10.9 oz (87.4 kg) IBW/kg (Calculated) : 91.4  Vital Signs: Temp: 98.2 F (36.8 C) (11/21 0804) Temp Source: Axillary (11/21 0804) BP: 106/70 (11/21 0804) Pulse Rate: 68 (11/21 0804)  Labs: Recent Labs    11/08/17 1041 11/08/17 2125 11/09/17 0304 11/09/17 0459 11/09/17 1256 11/10/17 0352  HGB 9.0*  --  8.2*  --   --  8.1*  HCT 27.5*  --  24.6*  --   --  24.7*  PLT 670*  --  551*  --   --  599*  APTT  --  33  --   --   --   --   LABPROT  --  14.2  --   --   --   --   INR  --  1.11  --   --   --   --   HEPARINUNFRC  --   --   --  <0.10* 0.28* 0.30  CREATININE 1.33*  --  1.27*  --   --  1.02  TROPONINI  --  0.04* 0.03*  --   --   --     Estimated Creatinine Clearance: 71.4 mL/min (by C-G formula based on SCr of 1.02 mg/dL).   Medications:  Infusions:  . amiodarone 30 mg/hr (11/10/17 0137)  . heparin 1,600 Units/hr (11/10/17 0603)  . piperacillin-tazobactam (ZOSYN)  IV 3.375 g (11/10/17 0653)  . sodium chloride 0.9 % 1,000 mL infusion Stopped (11/09/17 0932)  . vancomycin Stopped (11/10/17 9935)    Assessment: 82 year old male admitted with diverticular abscess and colonic fistula. He has new atrial fibrillation and is on anticoagulation with heparin. Note he has a history of GI bleeding.  His heparin level this morning is therapeutic. His CBC is stable with no active bleeding noted.  Goal of Therapy:  Heparin level 0.3-0.7 units/ml Monitor platelets by anticoagulation protocol: Yes   Plan:  Continue Heparin at 1600 units/hr Daily heparin level and CBC Monitor for bleeding complications  Norva Riffle 11/10/2017,9:41 AM

## 2017-11-10 NOTE — Progress Notes (Signed)
  Echocardiogram 2D Echocardiogram has been performed.  Roberto Owen 11/10/2017, 11:54 AM

## 2017-11-10 NOTE — Progress Notes (Signed)
Subjective:  Doing well denies any chest pain or shortness of breath. Converted back into sinus rhythm. Frequent APCs and occasional PVCs on the monitor now states overall feels well  Objective:  Vital Signs in the last 24 hours: Temp:  [97.6 F (36.4 C)-98.9 F (37.2 C)] 98.1 F (36.7 C) (11/21 1201) Pulse Rate:  [47-142] 65 (11/21 1201) Resp:  [17-29] 20 (11/21 1201) BP: (93-132)/(63-100) 132/81 (11/21 1201) SpO2:  [98 %-100 %] 100 % (11/21 1201) Weight:  [87.4 kg (192 lb 10.9 oz)] 87.4 kg (192 lb 10.9 oz) (11/20 1312)  Intake/Output from previous day: 11/20 0701 - 11/21 0700 In: 1918 [I.V.:1468; IV Piggyback:450] Out: 725 [Urine:725] Intake/Output from this shift: Total I/O In: -  Out: 150 [Urine:150]  Physical Exam: Neck: no adenopathy, no carotid bruit, no JVD and supple, symmetrical, trachea midline Lungs: decreased breath sound at bases Heart: regular rate and rhythm, S1, S2 normal and soft systolic murmur noted Abdomen: soft, non-tender; bowel sounds normal; no masses,  no organomegaly and left lower quadrant dressing noted dry Extremities: extremities normal, atraumatic, no cyanosis or edema  Lab Results: Recent Labs    11/09/17 0304 11/10/17 0352  WBC 15.8* 12.6*  HGB 8.2* 8.1*  PLT 551* 599*   Recent Labs    11/09/17 0304 11/10/17 0352  NA 131* 131*  K 3.9 3.5  CL 101 101  CO2 21* 25  GLUCOSE 106* 108*  BUN 28* 18  CREATININE 1.27* 1.02   Recent Labs    11/08/17 2125 11/09/17 0304  TROPONINI 0.04* 0.03*   Hepatic Function Panel Recent Labs    11/09/17 0304  PROT 6.2*  ALBUMIN 1.5*  AST 22  ALT 12*  ALKPHOS 73  BILITOT 0.4   No results for input(s): CHOL in the last 72 hours. No results for input(s): PROTIME in the last 72 hours.  Imaging: Imaging results have been reviewed and Dg Chest 2 View  Result Date: 11/08/2017 CLINICAL DATA:  Pleural effusion. EXAM: CHEST  2 VIEW COMPARISON:  Radiographs of March 25, 2012. FINDINGS: Stable  cardiomediastinal silhouette. No pneumothorax is noted. Right lung is clear. Mild left basilar atelectasis or infiltrate is noted. Bony thorax is unremarkable. IMPRESSION: Mild left basilar subsegmental atelectasis or infiltrate is noted. Followup PA and lateral chest X-ray is recommended in 3-4 weeks following trial of antibiotic therapy to ensure resolution and exclude underlying malignancy. Electronically Signed   By: Marijo Conception, M.D.   On: 11/08/2017 16:46   Ct Abdomen Pelvis W Contrast  Result Date: 11/08/2017 CLINICAL DATA:  82 year old male. Left lower quadrant abscess secondary to diverticular disease in September with percutaneous drain placement. Subsequent feculent drainage, with fluoro guided injection demonstrating fistula to the colon. Drain occlusion on 10/28/2017. Up size an exchange for a new 14 [place] drain at that time. 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 Recent decreased drainage volume from catheter. Loss of appetite for 2 days with vomiting. EXAM: CT ABDOMEN AND PELVIS WITH CONTRAST TECHNIQUE: Multidetector CT imaging of the abdomen and pelvis was performed using the standard protocol following bolus administration of intravenous contrast. CONTRAST:  139mL ISOVUE-300 IOPAMIDOL (ISOVUE-300) INJECTION 61% COMPARISON:  Drain replacement images [id number]. CT Abdomen and Pelvis 09/30/2017. FINDINGS: Lower chest: New loculated appearing small to moderate size left pleural effusion with simple fluid density (series 3, image 6). Associated left lung base compressive atelectasis. No definite lower lobe or lingula consolidation. No pericardial effusion.  Stable and negative right lung base. Hepatobiliary: Stable and  negative, scattered punctate calcified granulomas. Pancreas: Negative. Spleen: Negative. Adrenals/Urinary Tract: Normal adrenal glands. Bilateral renal enhancement and contrast excretion is symmetric and within normal limits. No hydronephrosis or hydroureter. 5 cm posterior  right urinary bladder diverticulum appears unchanged. Stable and negative urinary bladder otherwise. Stomach/Bowel: Stable and negative rectum. Redundant sigmoid colon with persistent circumferential wall thickening in the proximal sigmoid just distal to the left abdominal percutaneous pigtail drain site (series 3, image 56). Adjacent left gutter/mesenteric stranding or trace fluid has not resolved since October and may be mildly increased (series 3, image 58). No extraluminal gas identified. The distal sigmoid appears stable and negative. The descending colon demonstrates increased distention with low-density stool to the splenic flexure. The lumen of the descending colon becomes narrow at the level of the pigtail drain and sigmoid wall thickening. See coronal images 57 52 through 58. Distal descending colon wall thickening. Redundant transverse colon. No retained stool at the splenic flexure. Small volume of fluid in the right colon. Respiratory motion artifact in the right lower quadrant. The cecum is on a lax mesentery. The appendix is not identified today. Nondilated distal small bowel. No dilated small bowel loops. Decompressed stomach and duodenum. No other abdominal free fluid. Vascular/Lymphatic: Mild for age atherosclerosis. Major arterial structures are patent. Portal venous system appears patent. Stable lymph nodes. No lymphadenopathy. Reproductive: Negative. Other: No pelvic free fluid. Musculoskeletal: Stable.  No acute osseous abnormality identified. IMPRESSION: 1. The position of the left abdominal percutaneous pigtail catheter appears stable since October. No residual abscess but continued and perhaps progressive circumferential colonic wall thickening and mesenteric inflammation near the catheter. 2. Increased low-density stool distending the upstream left colon and splenic flexure raising the possibility of partial obstruction. 3. Consider Colonoscopy: The persistent colonic wall thickening in #1  may be inflammatory but colonic tumor is difficult to exclude. 4. New since October small to moderate size loculated left pleural effusion. Associated lung base atelectasis without definite pneumonia. Electronically Signed   By: Genevie Ann M.D.   On: 11/08/2017 15:30    Cardiac Studies:  Assessment/Plan:  Status post New onset A. fib/flutter with RVR chads score of 2 Hypertension Colonic diverticulitis and partial colon obstruction History of peptic ulcer disease secondary to NSAIDs History of GI bleed in the past History of hematuria in the past Chronic anemia secondary to above  Plan Change IV amiodarone to by mouth as per orders Consider switching heparin to Eliquis  if okay from GI point of view and no surgery planned   LOS: 2 days    Charolette Forward 11/10/2017, 1:06 PM

## 2017-11-10 NOTE — Progress Notes (Signed)
[facility] Progress Note     Subjective: CC: feeling better Patient states he feels better. Denies abdominal pain, n/v. States he is still having regular BM per rectum. Patient thinks that his colonoscopy was at [facility].  IR has examined drain and do not feel that upsizing drain would be of any benefit.  Labs show WBC 12.6, Hgb 8.1, Cr 1.02, K 3.5. Afebrile. BP soft.   Patient with persistent tachycardia, off cardizem drip. Getting amiodarone today.   Objective: Vital signs in last 24 hours: Temp:  [97.6 F (36.4 C)-98.9 F (37.2 C)] 98.2 F (36.8 C) (11/21 0804) Pulse Rate:  [47-142] 68 (11/21 0804) Resp:  [17-29] 19 (11/21 0804) BP: (93-126)/(61-100) 106/70 (11/21 0804) SpO2:  [98 %-100 %] 100 % (11/21 0804) Weight:  [87.4 kg (192 lb 10.9 oz)] 87.4 kg (192 lb 10.9 oz) (11/20 1312)    Intake/Output from previous day: 11/20 0701 - 11/21 0700 In: 1918 [I.V.:1468; IV Piggyback:450] Out: 725 [Urine:725] Intake/Output this shift: No intake/output data recorded.  PE: Gen:  Alert, NAD, pleasant Card:  Tachycardic, pedal pulses 2+ BL Pulm:  Normal effort, bibasilar rales Abd: Soft, non-tender, non-distended, bowel sounds present, no HSM, LUQ drain with feculent drainage and small amount drainage around tube Skin: warm and dry, no rashes  Psych: A&Ox3   Lab Results:  Recent Labs    11/09/17 0304 11/10/17 0352  WBC 15.8* 12.6*  HGB 8.2* 8.1*  HCT 24.6* 24.7*  PLT 551* 599*   BMET Recent Labs    11/09/17 0304 11/10/17 0352  NA 131* 131*  K 3.9 3.5  CL 101 101  CO2 21* 25  GLUCOSE 106* 108*  BUN 28* 18  CREATININE 1.27* 1.02  CALCIUM 7.5* 7.4*   PT/INR Recent Labs    11/08/17 2125  LABPROT 14.2  INR 1.11   CMP     Component Value Date/Time   NA 131 (L) 11/10/2017 0352   K 3.5 11/10/2017 0352   CL 101 11/10/2017 0352   CO2 25 11/10/2017 0352   GLUCOSE 108 (H) 11/10/2017 0352   BUN 18 11/10/2017 0352   CREATININE 1.02  11/10/2017 0352   CALCIUM 7.4 (L) 11/10/2017 0352   PROT 6.2 (L) 11/09/2017 0304   ALBUMIN 1.5 (L) 11/09/2017 0304   AST 22 11/09/2017 0304   ALT 12 (L) 11/09/2017 0304   ALKPHOS 73 11/09/2017 0304   BILITOT 0.4 11/09/2017 0304   GFRNONAA >60 11/10/2017 0352   GFRAA >60 11/10/2017 0352   Lipase     Component Value Date/Time   LIPASE 19 11/08/2017 1041       Studies/Results: Dg Chest 2 View  Result Date: 11/08/2017 CLINICAL DATA:  Pleural effusion. EXAM: CHEST  2 VIEW COMPARISON:  Radiographs of March 25, 2012. FINDINGS: Stable cardiomediastinal silhouette. No pneumothorax is noted. Right lung is clear. Mild left basilar atelectasis or infiltrate is noted. Bony thorax is unremarkable. IMPRESSION: Mild left basilar subsegmental atelectasis or infiltrate is noted. Followup PA and lateral chest X-ray is recommended in 3-4 weeks following trial of antibiotic therapy to ensure resolution and exclude underlying malignancy. Electronically Signed   By: Marijo Conception, M.D.   On: 11/08/2017 16:46   Ct Abdomen Pelvis W Contrast  Result Date: 11/08/2017 CLINICAL DATA:  82 year old male. Left lower quadrant abscess secondary to diverticular disease in September with percutaneous drain placement. Subsequent feculent drainage, with fluoro guided injection demonstrating fistula to the colon. Drain occlusion on 10/28/2017. Up size an exchange  for a new 14 [place] drain at that time. 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 Recent decreased drainage volume from catheter. Loss of appetite for 2 days with vomiting. EXAM: CT ABDOMEN AND PELVIS WITH CONTRAST TECHNIQUE: Multidetector CT imaging of the abdomen and pelvis was performed using the standard protocol following bolus administration of intravenous contrast. CONTRAST:  187mL ISOVUE-300 IOPAMIDOL (ISOVUE-300) INJECTION 61% COMPARISON:  Drain replacement images [id number]. CT Abdomen and Pelvis 09/30/2017. FINDINGS: Lower chest: New loculated appearing  small to moderate size left pleural effusion with simple fluid density (series 3, image 6). Associated left lung base compressive atelectasis. No definite lower lobe or lingula consolidation. No pericardial effusion.  Stable and negative right lung base. Hepatobiliary: Stable and negative, scattered punctate calcified granulomas. Pancreas: Negative. Spleen: Negative. Adrenals/Urinary Tract: Normal adrenal glands. Bilateral renal enhancement and contrast excretion is symmetric and within normal limits. No hydronephrosis or hydroureter. 5 cm posterior right urinary bladder diverticulum appears unchanged. Stable and negative urinary bladder otherwise. Stomach/Bowel: Stable and negative rectum. Redundant sigmoid colon with persistent circumferential wall thickening in the proximal sigmoid just distal to the left abdominal percutaneous pigtail drain site (series 3, image 56). Adjacent left gutter/mesenteric stranding or trace fluid has not resolved since October and may be mildly increased (series 3, image 58). No extraluminal gas identified. The distal sigmoid appears stable and negative. The descending colon demonstrates increased distention with low-density stool to the splenic flexure. The lumen of the descending colon becomes narrow at the level of the pigtail drain and sigmoid wall thickening. See coronal images 57 52 through 58. Distal descending colon wall thickening. Redundant transverse colon. No retained stool at the splenic flexure. Small volume of fluid in the right colon. Respiratory motion artifact in the right lower quadrant. The cecum is on a lax mesentery. The appendix is not identified today. Nondilated distal small bowel. No dilated small bowel loops. Decompressed stomach and duodenum. No other abdominal free fluid. Vascular/Lymphatic: Mild for age atherosclerosis. Major arterial structures are patent. Portal venous system appears patent. Stable lymph nodes. No lymphadenopathy. Reproductive: Negative.  Other: No pelvic free fluid. Musculoskeletal: Stable.  No acute osseous abnormality identified. IMPRESSION: 1. The position of the left abdominal percutaneous pigtail catheter appears stable since October. No residual abscess but continued and perhaps progressive circumferential colonic wall thickening and mesenteric inflammation near the catheter. 2. Increased low-density stool distending the upstream left colon and splenic flexure raising the possibility of partial obstruction. 3. Consider Colonoscopy: The persistent colonic wall thickening in #1 may be inflammatory but colonic tumor is difficult to exclude. 4. New since October small to moderate size loculated left pleural effusion. Associated lung base atelectasis without definite pneumonia. Electronically Signed   By: Genevie Ann M.D.   On: 11/08/2017 15:30    Anti-infectives: Anti-infectives (From admission, onward)   Start     Dose/Rate Route Frequency Ordered Stop   11/09/17 1600  vancomycin (VANCOCIN) IVPB 750 mg/150 ml premix     750 mg 150 mL/hr over 60 Minutes Intravenous Every 12 hours 11/09/17 1500     11/09/17 0800  vancomycin (VANCOCIN) IVPB 750 mg/150 ml premix  Status:  Discontinued     750 mg 150 mL/hr over 60 Minutes Intravenous Every 12 hours 11/08/17 2152 11/09/17 1500   11/09/17 0200  piperacillin-tazobactam (ZOSYN) IVPB 3.375 g     3.375 g 12.5 mL/hr over 240 Minutes Intravenous Every 8 hours 11/08/17 1821     11/08/17 2200  vancomycin (VANCOCIN) 2,000 mg in  sodium chloride 0.9 % 500 mL IVPB     2,000 mg 250 mL/hr over 120 Minutes Intravenous  Once 11/08/17 2152 11/09/17 0020   11/08/17 1830  piperacillin-tazobactam (ZOSYN) IVPB 3.375 g     3.375 g 100 mL/hr over 30 Minutes Intravenous  Once 11/08/17 1821 11/08/17 2134   11/08/17 1745  piperacillin-tazobactam (ZOSYN) IVPB 3.375 g  Status:  Discontinued     3.375 g 100 mL/hr over 30 Minutes Intravenous  Once 11/08/17 1733 11/08/17 1734       Assessment/Plan Sigmoid  colon thickening and inflammation.It is presumed that this is acute diverticulitis with perforation. 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 -IV antibiotics. -Bowel rest. Nothing by mouth except meds and ice chips - IR seen and do not feel there is any role for upsizing drain given feculent output from around drain, feel that fistula is not well controlled -Ideally, the inflammation will settle down, the fistula will close, allowing outpatient workup with colonoscopy and elective one stage resection -If he does not respond he may require laparotomy, sigmoid colectomy and colostomy. He and his family are aware of this possibility - will discuss with MD if this needs to be considered sooner with concern for uncontrolled fistula  -He reaffirms that he had a normal colonoscopy in [place] one year ago.  We will try to get that report.  Tachycardia-new-onset atrial fibrillation and flutter with RVR.  Off cardizem drip as of 11/20. Bolus of amiodarone with NS bolus today. Cardiology to see today.  Loculated left pleural effusion.  Possibly the cause of his hiccups. - CT chest and evaluation by cardiothoracic when Cr improved enough ?maybe today  History peptic ulcer disease by endoscopy - protonix History anemia requiring transfusion  FEN: NPO w/ ice chips, IVF VTE: SCDs, heparin drip ID: vanc/[email or web address] (11/19>>)  LOS: 2 days    Brigid Re , [facility] 11/10/2017, 9:42 AM Pager: [phone] Consults: [phone] Mon-Fri 7:00 am-4:30 pm Sat-Sun 7:00 am-11:30 am

## 2017-11-10 NOTE — Progress Notes (Signed)
Patient HR > 130 despite giving IV metoprolol 2.5 mg @ 01:00. SBP in 90s. Pt is currently sleeping. Do not want to give metoprolol due to low normal BP. Patient has had intermittent atrial fibrillation to sinus rhythm on telemetry through the night. Spoke with Dr. Radford Pax with cardiology. She recommended giving a bolus of amiodarone 150 mg over 20 minutes plus 250 NS bolus to help elevate BP.

## 2017-11-10 NOTE — Progress Notes (Signed)
Initial Nutrition Assessment  DOCUMENTATION CODES:   Severe malnutrition in context of chronic illness  INTERVENTION:    Monitor for diet advancement and nutritional needs, will provide medically appropriate nutritional intervention   If unable to advance diet within 1-2 days, RD recommends nutrition via TPN. Pt has been without adequate oral intake for > 7 days.  NUTRITION DIAGNOSIS:   Severe Malnutrition related to chronic illness(altered GI function) as evidenced by severe muscle depletion, severe fat depletion, percent weight loss(8% in 3 months).  GOAL:   Patient will meet greater than or equal to 90% of their needs  MONITOR:   Diet advancement, Labs, Weight trends, I & O's  REASON FOR ASSESSMENT:   Consult Assessment of nutrition requirement/status  ASSESSMENT:   Pt with PMH of amenia, peptic ulcer disease, hx of GI bleed, diverticulosis presents with colonic obstruction and loculated pleural effusion possibly causing persistent hiccups  Pt is NPO for bowel rest, care team is observing if inflammation improves. If not, considering surgical interventions.  Pt with significant weight loss. Pt reports he weight 206 lbs a month ago. Pt has lost 8% body weight in the last three months. Also reporting a loss of energy.  Pt reports consuming liquids PTA but he has not had solid food in 8 days. Pt reports N/V at that time but denies current symptoms. Pt reports regular BM.  Pt with new onset A. Fib/flutter  Labs reviewed; Na 131, Albumin 1.5, Hemoglobin 8.1 Medications reviewed; Protonix  NUTRITION - FOCUSED PHYSICAL EXAM:    Most Recent Value  Orbital Region  Severe depletion  Upper Arm Region  Severe depletion  Thoracic and Lumbar Region  Moderate depletion  Buccal Region  Mild depletion  Temple Region  Severe depletion  Clavicle Bone Region  Severe depletion  Clavicle and Acromion Bone Region  Severe depletion  Scapular Bone Region  Unable to assess  Dorsal  Hand  Severe depletion  Patellar Region  Moderate depletion  Anterior Thigh Region  Moderate depletion  Posterior Calf Region  Moderate depletion  Edema (RD Assessment)  Mild       Diet Order:  Diet NPO time specified Except for: [employer], Sips with Meds  EDUCATION NEEDS:   No education needs have been identified at this time  Skin:  Skin Assessment: Reviewed RN Assessment  Last BM:  No recorded BM date  Height:   Ht Readings from Last 1 Encounters:  11/09/17 6\' 6"  (1.981 m)    Weight:   Wt Readings from Last 1 Encounters:  11/09/17 192 lb 10.9 oz (87.4 kg)    Ideal Body Weight:  97 kg  BMI:  Body mass index is 22.27 kg/m.  Estimated Nutritional Needs:   Kcal:  2100-2300  Protein:  105-115 grams  Fluid:  >/= 2 L/d   Parks Ranger, MS, RDN, LDN 11/10/2017 2:32 PM

## 2017-11-10 NOTE — Progress Notes (Addendum)
Family Medicine Teaching Service Daily Progress Note Intern Pager: [phone]  Patient name: Roberto Owen Medical record number: [id number] Date of birth: 1935/06/13 Age: 82 y.o. Gender: male  Primary Care Provider: Patient, No Pcp Per Consultants: Surgery, IR, Cardiology Code Status: FULL  Pt Overview and Major Events to Date:  11/19 - admitted for GI spasms, LOA IR placed abdominal PERC drain for abscess  Assessment and Plan: Roberto Owen is a 82 y.o. male presenting with an in place abdominal drain due to previous episode of diverticulitis with abscess of the sigmoid colon and subsequent development of colonic fistula. PMH is significant for Peptic ulcer disease, history of GI bleed and previous episode of diverticulosis.  Sigmoid colon thickening/inflammation with fistuals:  Diverticulitis flare with fistuals is emerging as the most likely cause of current GI issues but would expect more positive findings on physical exam. No acute abdomen on exam so would not expect a perforated colon. He continues to have thick feculent drainage from around the drain insertion site. - Continue NPO bowel rest, sips with meds and ice chips - Vanc/Zosyn dosed per pharmacy (Day 3, started on 11/19) - Gripe water to help with GI spasms - Surgery is following. Recommend bowel rest and waiting to see if inflammation will improve and outpatient work up with colonoscopy and elective resection. Possibility of laparotomy with sigmoid colectomy and colostomy if no improvement. - IR consulted and recommended no increase in the size of the drain or drain injection and that drain is not controlling his fistula.  - Continue NS IVF 110ml/hr - baclofen PRN spasm - tylenol PRN - zofran PRN  Loculated Left Pleural Effusion: Found on CT Abd and suspicous for PNA. Patient is afebrile but has productive cough with whitish phlegm. No difficulty breathing except when having GI spasms but is having desats into the mid  80s while in the ED. Currently still on room air. This could be the cause of his hiccups. - CVTS consulted, recommended IR put in drain IF patient becomes septic and no surgery as loculated effusion is in location accessible for pigtail cath. - Chest CT tomorrow, then consulting IR to get tap for pleural effusion/PNA work up - Continue Vanc/Zosyn for HCAP coverage - Continuous pulse ox - Spiometry q1hr  New Onset Afib: New onset, with RVR in ED s/p Cardizem bolus and drip with no significant rate control, subsequently started on amiodarone per Cardiology. He has no previous cardiac history.  Possibly precipitated by infection. Trop 0.01>0.04>0.03. TSH 0.271, low. Electrolytes are normal. No chest pain. Chads vasc score of 2. Hasbled score of 1. Has a history of peptic ulcer disease. Blood pressure stable.  - T3/4 was low at 0.9/0.98 - Troponin 0.03 - Continue Heparin per Cards - Continue amiodarone, per Cards - Echo - performed but not read - Magnesium level 2.2  AKI: Creatinine 1.33>1.27>1.02, returning to baseline of  ~0.8.  BUN creatinine is greater than 20 indicating prerenal.  Likely due to decreased oral intake. - Continue normal saline at 175 cc/h - Continue to follow BMP for resolution of AKI  Hyponatremia: Sodium of 129>131>131.  Likely due to dehydration. - IV fluids as noted above  Thrombocytosis: Platelets of 670.  It is not chronic.  Likely due to acute infection. - Monitor   Anemia: Hemoglobin 9 which is at baseline.  No signs of overt bleeding. - Monitor with labs  FEN/GI: Protonix, NPO sips with meds and ice chips  Prophylaxis: Heparin  Disposition: continue inpatient management of  GI spasms  Subjective:  Patient states today he feels better than yesterday. He still has no abdominal pain and states his "hiccups" are better but they are still intermittent. He did mention he is slightly upset that he continues to have drainage around his drain insertion and it  smells.  Objective: Temp:  [97.6 F (36.4 C)-98.9 F (37.2 C)] 97.6 F (36.4 C) (11/21 0300) Pulse Rate:  [47-142] 136 (11/21 0300) Resp:  [17-29] 20 (11/21 0300) BP: (93-127)/(55-100) 93/66 (11/21 0300) SpO2:  [98 %-100 %] 98 % (11/21 0300) Weight:  [192 lb 10.9 oz (87.4 kg)] 192 lb 10.9 oz (87.4 kg) (11/20 1312)  Physical Exam: Gen: Alert and Oriented x 3, NAD HEENT: Normocephalic, atraumatic, PERRLA, EOMI CV: Irregular rhythm, normal rhythm, no murmurs, normal S1, S2 split Resp: decreased breath sounds bilaterally, CTAB, no wheezing, rales, or rhonchi, comfortable work of breathing Abd: non-distended, non-tender, soft, +bs in all four quadrants, no hepatomegaly MSK: FROM in all four extremities Ext: no clubbing, cyanosis, or edema Skin: warm, dry, intact, no rashes  Laboratory: Recent Labs  Lab 11/08/17 1041 11/09/17 0304 11/10/17 0352  WBC 15.3* 15.8* 12.6*  HGB 9.0* 8.2* 8.1*  HCT 27.5* 24.6* 24.7*  PLT 670* 551* 599*   Recent Labs  Lab 11/08/17 1041 11/09/17 0304 11/10/17 0352  NA 129* 131* 131*  K 4.2 3.9 3.5  CL 96* 101 101  CO2 24 21* 25  BUN 39* 28* 18  CREATININE 1.33* 1.27* 1.02  CALCIUM 8.0* 7.5* 7.4*  PROT 7.2 6.2*  --   BILITOT 0.5 0.4  --   ALKPHOS 92 73  --   ALT 14* 12*  --   AST 27 22  --   GLUCOSE 108* 106* 108*   11/19 - Lipase: 19 11/19 - Lactic Acid: 1.52, 1.15 11/20 - Troponin I: 0.03,  11/20 - TSH: 0.271 (low)  Imaging/Diagnostic Tests:  11/19 - CXR:  IMPRESSION: Mild left basilar subsegmental atelectasis or infiltrate is noted. Followup PA and lateral chest X-ray is recommended in 3-4 weeks following trial of antibiotic therapy to ensure resolution and exclude underlying malignancy.  11/19 - CT Abd: IMPRESSION: 1. The position of the left abdominal percutaneous pigtail catheter appears stable since October. No residual abscess but continued and perhaps progressive circumferential colonic wall thickening and mesenteric  inflammation near the catheter. 2. Increased low-density stool distending the upstream left colon and splenic flexure raising the possibility of partial obstruction.  3. Consider Colonoscopy: The persistent colonic wall thickening in #1 may be inflammatory but colonic tumor is difficult to exclude. 4. New since October small to moderate size loculated left pleural effusion. Associated lung base atelectasis without definite pneumonia.   Nuala Alpha, DO 11/10/2017, 7:07 AM PGY-1, [place] Intern pager: [phone], text pages welcome

## 2017-11-10 NOTE — Progress Notes (Signed)
Dressing to right side IR drain changed.  Pt tolerated well.  Cleansed very well and covered with drain sponges.  Then covered with 4x4's and held in place by micropore tape.

## 2017-11-11 ENCOUNTER — Other Ambulatory Visit: Payer: Self-pay

## 2017-11-11 DIAGNOSIS — R066 Hiccough: Secondary | ICD-10-CM

## 2017-11-11 DIAGNOSIS — J90 Pleural effusion, not elsewhere classified: Secondary | ICD-10-CM

## 2017-11-11 DIAGNOSIS — N179 Acute kidney failure, unspecified: Secondary | ICD-10-CM

## 2017-11-11 DIAGNOSIS — E0781 Sick-euthyroid syndrome: Secondary | ICD-10-CM

## 2017-11-11 DIAGNOSIS — D500 Iron deficiency anemia secondary to blood loss (chronic): Secondary | ICD-10-CM

## 2017-11-11 DIAGNOSIS — D509 Iron deficiency anemia, unspecified: Secondary | ICD-10-CM

## 2017-11-11 DIAGNOSIS — E43 Unspecified severe protein-calorie malnutrition: Secondary | ICD-10-CM

## 2017-11-11 DIAGNOSIS — I4891 Unspecified atrial fibrillation: Secondary | ICD-10-CM

## 2017-11-11 DIAGNOSIS — E860 Dehydration: Secondary | ICD-10-CM

## 2017-11-11 DIAGNOSIS — K632 Fistula of intestine: Secondary | ICD-10-CM

## 2017-11-11 DIAGNOSIS — E46 Unspecified protein-calorie malnutrition: Secondary | ICD-10-CM

## 2017-11-11 DIAGNOSIS — D473 Essential (hemorrhagic) thrombocythemia: Secondary | ICD-10-CM

## 2017-11-11 LAB — BASIC METABOLIC PANEL
Anion gap: 5 (ref 5–15)
BUN: 16 mg/dL (ref 6–20)
CALCIUM: 7.5 mg/dL — AB (ref 8.9–10.3)
CO2: 27 mmol/L (ref 22–32)
CREATININE: 1.04 mg/dL (ref 0.61–1.24)
Chloride: 103 mmol/L (ref 101–111)
GFR calc Af Amer: >60 mL/min (ref >60)
GFR calc non Af Amer: >60 mL/min (ref >60)
Glucose, Bld: 102 mg/dL — ABNORMAL HIGH (ref 65–99)
POTASSIUM: 3.4 mmol/L — AB (ref 3.5–5.1)
Sodium: 135 mmol/L (ref 135–145)

## 2017-11-11 LAB — HEPARIN LEVEL (UNFRACTIONATED): Heparin Unfractionated: 0.45 IU/mL (ref 0.30–0.70)

## 2017-11-11 LAB — CBC
HCT: 24.8 % — ABNORMAL LOW (ref 39.0–52.0)
Hemoglobin: 7.9 g/dL — ABNORMAL LOW (ref 13.0–17.0)
MCH: 23.4 pg — AB (ref 26.0–34.0)
MCHC: 31.9 g/dL (ref 30.0–36.0)
MCV: 73.6 fL — AB (ref 78.0–100.0)
Platelets: 568 10*3/uL — ABNORMAL HIGH (ref 150–400)
RBC: 3.37 MIL/uL — AB (ref 4.22–5.81)
RDW: 16 % — ABNORMAL HIGH (ref 11.5–15.5)
WBC: 10.9 10*3/uL — ABNORMAL HIGH (ref 4.0–10.5)

## 2017-11-11 LAB — VANCOMYCIN, TROUGH: Vancomycin Tr: 13 ug/mL — ABNORMAL LOW (ref 15–20)

## 2017-11-11 MED ORDER — VANCOMYCIN HCL IN DEXTROSE 1-5 GM/200ML-% IV SOLN
1000.0000 mg | Freq: Two times a day (BID) | INTRAVENOUS | Status: DC
  Administered 2017-11-11 – 2017-11-13 (×4): 1000 mg via INTRAVENOUS
  Filled 2017-11-11 (×5): qty 200

## 2017-11-11 MED ORDER — METOPROLOL TARTRATE 12.5 MG HALF TABLET
12.5000 mg | ORAL_TABLET | Freq: Two times a day (BID) | ORAL | Status: DC
  Administered 2017-11-11 – 2017-11-14 (×4): 12.5 mg via ORAL
  Filled 2017-11-11 (×7): qty 1

## 2017-11-11 MED ORDER — POTASSIUM CHLORIDE CRYS ER 20 MEQ PO TBCR
40.0000 meq | EXTENDED_RELEASE_TABLET | Freq: Three times a day (TID) | ORAL | Status: AC
  Administered 2017-11-11 (×3): 40 meq via ORAL
  Filled 2017-11-11 (×3): qty 2

## 2017-11-11 MED ORDER — PNEUMOCOCCAL VAC POLYVALENT 25 MCG/0.5ML IJ INJ
0.5000 mL | INJECTION | INTRAMUSCULAR | Status: AC
  Administered 2017-11-12: 0.5 mL via INTRAMUSCULAR
  Filled 2017-11-11: qty 0.5

## 2017-11-11 MED ORDER — DEXTROSE-NACL 5-0.45 % IV SOLN
INTRAVENOUS | Status: DC
  Administered 2017-11-11: 10:00:00 via INTRAVENOUS

## 2017-11-11 MED ORDER — ENSURE SURGERY PO LIQD
237.0000 mL | Freq: Two times a day (BID) | ORAL | Status: DC
  Administered 2017-11-11: 237 mL via ORAL
  Filled 2017-11-11 (×5): qty 237

## 2017-11-11 NOTE — Progress Notes (Signed)
Subjective:  Patient denies any chest pain or shortness of breath. Denies any palpitations. Denies any abdominal pain. In good spirits. Schedule for chest tube drained and possibly partial colectomy early next week. Remains in sinus rhythm with frequent PVCs on the monitor occasional episodes of bradycardia  Objective:  Vital Signs in the last 24 hours: Temp:  [97.5 F (36.4 C)-98.4 F (36.9 C)] 97.5 F (36.4 C) (11/22 0756) Pulse Rate:  [48-69] 63 (11/22 0756) Resp:  [11-27] 27 (11/22 0756) BP: (107-147)/(57-87) 129/65 (11/22 0756) SpO2:  [93 %-100 %] 100 % (11/22 0756)  Intake/Output from previous day: 11/21 0701 - 11/22 0700 In: 390.7 [I.V.:190.7; IV Piggyback:200] Out: 300 [Urine:300] Intake/Output from this shift: No intake/output data recorded.  Physical Exam: Neck: no adenopathy, no carotid bruit, no JVD and supple, symmetrical, trachea midline Lungs: Decrease breath sounds at bases Heart: regular rate and rhythm, S1, S2 normal and Soft systolic murmur noted no S3 gallop Abdomen: soft, non-tender; bowel sounds normal; no masses,  no organomegaly and left lower quadrant surgical drain and dressing noted Extremities: extremities normal, atraumatic, no cyanosis or edema  Lab Results: Recent Labs    11/10/17 0352 11/11/17 0319  WBC 12.6* 10.9*  HGB 8.1* 7.9*  PLT 599* 568*   Recent Labs    11/10/17 0352 11/11/17 0319  NA 131* 135  K 3.5 3.4*  CL 101 103  CO2 25 27  GLUCOSE 108* 102*  BUN 18 16  CREATININE 1.02 1.04   Recent Labs    11/08/17 2125 11/09/17 0304  TROPONINI 0.04* 0.03*   Hepatic Function Panel Recent Labs    11/09/17 0304  PROT 6.2*  ALBUMIN 1.5*  AST 22  ALT 12*  ALKPHOS 73  BILITOT 0.4   No results for input(s): CHOL in the last 72 hours. No results for input(s): PROTIME in the last 72 hours.  Imaging: Imaging results have been reviewed and Ct Chest W Contrast  Result Date: 11/10/2017 CLINICAL DATA:  Pleural effusions. EXAM:  CT CHEST WITH CONTRAST TECHNIQUE: Multidetector CT imaging of the chest was performed during intravenous contrast administration. CONTRAST:  72mL ISOVUE-300 IOPAMIDOL (ISOVUE-300) INJECTION 61% COMPARISON:  Two-view chest x-ray 11/08/2017 FINDINGS: Cardiovascular: The heart is enlarged. Coronary artery calcifications are present. Pulmonary artery is enlarged. The main pulmonary outflow tract measures 3.9 cm. The aorta is normal in size. Mediastinum/Nodes: Hyperdense material is present within the left hilum. This is greater than expected for calcification appears be some sort of embolic material. Lungs/Pleura: A loculated left lower lobe fusion is present. There is a large portion posteriorly inferiorly. A second loculated portion along the left major fissure. Right-sided pleural fluid is less certain to be loculated. There is significant volume loss in the left greater than right lower lobes. The upper lung fields are clear. Upper Abdomen: Unremarkable. 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 Vertebral body heights are otherwise normal. Rightward curvature of the upper thoracic spine is associated with the butterfly vertebral body. No focal lytic or blastic lesions are present. IMPRESSION: 1. Loculated bilateral pleural effusions, left greater than right. 2. Associated airspace disease likely reflects atelectasis. 3. Additional pleural irregularity on the left may represent infection associated with the fusion. 4. 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 Dextroconvex curvature is associated. Electronically Signed   By: San Morelle M.D.   On: 11/10/2017 15:19    Cardiac Studies:  Assessment/Plan:  Status post New  onset A. fib/flutter with RVR chads score of 2 Hypertension Colonic diverticulitis with perforation and status post partial colon obstruction History of peptic ulcer disease secondary to  NSAIDs History of GI bleed in the past History of hematuria in the past Chronic anemia secondary to above Plan Reduce Lopressor as per orders Continue rest of the medications. Continue heparin monitor CBC closely Consider GI evaluation for long-term anticoagulation. Check old records  LOS: 3 days    Charolette Forward 11/11/2017, 10:48 AM

## 2017-11-11 NOTE — Progress Notes (Signed)
Pharmacy Antibiotic Note  Roberto Owen is a 82 y.o. male admitted on 11/08/2017 with intra-abdominal infection.  Pharmacy has been consulted for Vancomycin dosing. WBC decreasing. Renal function OK.   Vancomycin trough is low at 13, drawn a little early due to last dose being given late, true trough lower than 13  Plan: Inc vancomycin to 1000 mg IV q12h Re-check vancomycin trough at steady state  Height: 6\' 6"  (198.1 cm) Weight: 192 lb 10.9 oz (87.4 kg) IBW/kg (Calculated) : 91.4  Temp (24hrs), Avg:98.1 F (36.7 C), Min:97.8 F (36.6 C), Max:98.4 F (36.9 C)  Recent Labs  Lab 11/08/17 1041 11/08/17 1116 11/08/17 2139 11/09/17 0304 11/10/17 0352 11/11/17 0319  WBC 15.3*  --   --  15.8* 12.6* 10.9*  CREATININE 1.33*  --   --  1.27* 1.02 1.04  LATICACIDVEN  --  1.52 1.15  --   --   --   VANCOTROUGH  --   --   --   --   --  13*    Estimated Creatinine Clearance: 70 mL/min (by C-G formula based on SCr of 1.04 mg/dL).    No Known Allergies  Antimicrobials this admission: Vancomycin 11/19>> Zosyn 11/19>>   [name] 11/11/2017 4:38 AM

## 2017-11-11 NOTE — Progress Notes (Signed)
ANTICOAGULATION CONSULT NOTE - Follow Up Consult  Pharmacy Consult for Heparin Indication: atrial fibrillation  No Known Allergies  Patient Measurements: Height: 6\' 6"  (198.1 cm) Weight: 192 lb 10.9 oz (87.4 kg) IBW/kg (Calculated) : 91.4 Heparin Dosing Weight: 87 kg  Vital Signs: Temp: 97.7 F (36.5 C) (11/22 1131) Temp Source: Oral (11/22 1131) BP: 134/65 (11/22 1131) Pulse Rate: 71 (11/22 1131)  Labs: Recent Labs    11/08/17 2125  11/09/17 0304  11/09/17 1256 11/10/17 0352 11/11/17 0319  HGB  --    < > 8.2*  --   --  8.1* 7.9*  HCT  --   --  24.6*  --   --  24.7* 24.8*  PLT  --   --  551*  --   --  599* 568*  APTT 33  --   --   --   --   --   --   LABPROT 14.2  --   --   --   --   --   --   INR 1.11  --   --   --   --   --   --   HEPARINUNFRC  --   --   --    < > 0.28* 0.30 0.45  CREATININE  --   --  1.27*  --   --  1.02 1.04  TROPONINI 0.04*  --  0.03*  --   --   --   --    < > = values in this interval not displayed.    Estimated Creatinine Clearance: 70 mL/min (by C-G formula based on SCr of 1.04 mg/dL).   Assessment:  82 year old male admitted with diverticular abscess and colonic fistula. He has new atrial fibrillation and is on anticoagulation with IV heparin. Note he has a history of GI bleed due to NSAIDs in August 2018.    Heparin level is therapeutic (0.45) today on 1600 units/hr  CBC low stable. No bleeding reported.   Noted plans for left chest tube placement in IR on 11/23, and possible surgery next week.  Goal of Therapy:  Heparin level 0.3-0.7 units/ml Monitor platelets by anticoagulation protocol: Yes   Plan:   Continue heparin drip at 1600 units/hr  Daily heparin level and CBC while on heparin.  Will follow up for timing of holding heparin drip for chest tube placement on 11/23, as well as resuming post-procedure.    Arty Baumgartner, [place] Pager: [phone] 11/11/2017,11:58 AM

## 2017-11-11 NOTE — Progress Notes (Addendum)
Subjective: Stable and alert.  Essentially no pain.   Still draining stool around left lower quadrant drain.  IR states date cannot do anything further about this. On heparin drip and oral amiodarone.  Heart rate 55.  Much better.  CT scan shows bilateral loculated pleural effusions.  Left side much larger than his right.  WBC 10,900.  Hemoglobin 7.9.  Potassium 3.4.  Creatinine 1.04.  Objective: Vital signs in last 24 hours: Temp:  [97.5 F (36.4 C)-98.4 F (36.9 C)] 97.5 F (36.4 C) (11/22 0756) Pulse Rate:  [32-69] 63 (11/22 0756) Resp:  [11-27] 27 (11/22 0756) BP: (107-147)/(57-87) 129/65 (11/22 0756) SpO2:  [93 %-100 %] 100 % (11/22 0756) Last BM Date: 11/10/17  Intake/Output from previous day: 11/21 0701 - 11/22 0700 In: 390.7 [I.V.:190.7; IV Piggyback:200] Out: 300 [Urine:300] Intake/Output this shift: No intake/output data recorded.   PE: Gen:  Alert, NAD, pleasant Card:  HR 55, pedal pulses 2+ BL Pulm:  Normal effort, bibasilar rales-faint Abd: Soft, non-tender, non-distended, bowel sounds present, no HSM, LUQ drain with feculent drainage and small amount drainage around tube Skin: warm and dry, no rashes  Psych: A&Ox3      Lab Results:  Recent Labs    11/10/17 0352 11/11/17 0319  WBC 12.6* 10.9*  HGB 8.1* 7.9*  HCT 24.7* 24.8*  PLT 599* 568*   BMET Recent Labs    11/10/17 0352 11/11/17 0319  NA 131* 135  K 3.5 3.4*  CL 101 103  CO2 25 27  GLUCOSE 108* 102*  BUN 18 16  CREATININE 1.02 1.04  CALCIUM 7.4* 7.5*   PT/INR Recent Labs    11/08/17 2125  LABPROT 14.2  INR 1.11   ABG No results for input(s): PHART, HCO3 in the last 72 hours.  Invalid input(s): PCO2, PO2  Studies/Results: Ct Chest W Contrast  Result Date: 11/10/2017 CLINICAL DATA:  Pleural effusions. EXAM: CT CHEST WITH CONTRAST TECHNIQUE: Multidetector CT imaging of the chest was performed during intravenous contrast administration. CONTRAST:  22mL ISOVUE-300  IOPAMIDOL (ISOVUE-300) INJECTION 61% COMPARISON:  Two-view chest x-ray 11/08/2017 FINDINGS: Cardiovascular: The heart is enlarged. Coronary artery calcifications are present. Pulmonary artery is enlarged. The main pulmonary outflow tract measures 3.9 cm. The aorta is normal in size. Mediastinum/Nodes: Hyperdense material is present within the left hilum. This is greater than expected for calcification appears be some sort of embolic material. Lungs/Pleura: A loculated left lower lobe fusion is present. There is a large portion posteriorly inferiorly. A second loculated portion along the left major fissure. Right-sided pleural fluid is less certain to be loculated. There is significant volume loss in the left greater than right lower lobes. The upper lung fields are clear. Upper Abdomen: Unremarkable. 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 Vertebral body heights are otherwise normal. Rightward curvature of the upper thoracic spine is associated with the butterfly vertebral body. No focal lytic or blastic lesions are present. IMPRESSION: 1. Loculated bilateral pleural effusions, left greater than right. 2. Associated airspace disease likely reflects atelectasis. 3. Additional pleural irregularity on the left may represent infection associated with the fusion. 4. 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 Dextroconvex curvature is associated. Electronically Signed   By: San Morelle M.D.   On: 11/10/2017 15:19    Anti-infectives: Anti-infectives (From admission, onward)   Start     Dose/Rate Route Frequency Ordered Stop   11/11/17 1600  vancomycin (VANCOCIN)  IVPB 1000 mg/200 mL premix     1,000 mg 200 mL/hr over 60 Minutes Intravenous Every 12 hours 11/11/17 0443     11/09/17 1600  vancomycin (VANCOCIN) IVPB 750 mg/150 ml premix  Status:  Discontinued     750 mg 150 mL/hr over 60 Minutes Intravenous  Every 12 hours 11/09/17 1500 11/11/17 0443   11/09/17 0800  vancomycin (VANCOCIN) IVPB 750 mg/150 ml premix  Status:  Discontinued     750 mg 150 mL/hr over 60 Minutes Intravenous Every 12 hours 11/08/17 2152 11/09/17 1500   11/09/17 0200  piperacillin-tazobactam (ZOSYN) IVPB 3.375 g     3.375 g 12.5 mL/hr over 240 Minutes Intravenous Every 8 hours 11/08/17 1821     11/08/17 2200  vancomycin (VANCOCIN) 2,000 mg in sodium chloride 0.9 % 500 mL IVPB     2,000 mg 250 mL/hr over 120 Minutes Intravenous  Once 11/08/17 2152 11/09/17 0020   11/08/17 1830  piperacillin-tazobactam (ZOSYN) IVPB 3.375 g     3.375 g 100 mL/hr over 30 Minutes Intravenous  Once 11/08/17 1821 11/08/17 2134   11/08/17 1745  piperacillin-tazobactam (ZOSYN) IVPB 3.375 g  Status:  Discontinued     3.375 g 100 mL/hr over 30 Minutes Intravenous  Once 11/08/17 1733 11/08/17 1734      Assessment/Plan:  Sigmoid colon thickening and inflammation.It is presumed that this is acute diverticulitis with perforation. 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 -IV antibiotics. -Allow full liquid diet -IR seen and do not feel there is any role for upsizing drain given feculent output from around drain, feel that fistula is not well controlled -He is not getting any better despite initial improvement.  Colocutaneous fistula remains open following a more than adequate follow percutaneous drainage.  He will need surgery this admission once medical problems are stabilized.  -He reaffirms that he had a normal colonoscopy in [place] one year ago. We will try to get that report.  Tachycardia-new-onset atrial fibrillation and flutter with RVR.Seems much better now with controlled heart rate on oral amiodarone and heparin drip  Loculated left pleural effusion. Left greater than  right.  This will need to be managed prior to surgical resection of his sigmoid colon.  Will discuss best drainage technique with thoracic surgery.  History peptic ulcer disease by endoscopy- protonix History anemia requiring transfusion    Plan: -Continue heparin drip.  Do not start Eliquis as it looks like he will be needing surgery  -Consult thoracic surgery to select best drainage technique for his loculated pleural effusions--I reviewed CT scans with Dr. Roxy Manns.  He suggested having radiology place a pigtail catheter on the larger effusion on the left and leaving the small right-sided effusion alone.  I agree and will plan to do this tomorrow.  I have discussed this with the patient and he is aware.  -Will try to obtain colonoscopy report from [place]   -Tentatively plan exploratory  laparotomy, left colectomy with colostomy during this hospitalization.  The patient is in agreement and understands that he is not improving      LOS: 3 days    Adin Hector 11/11/2017

## 2017-11-11 NOTE — Consult Note (Signed)
Chief Complaint: Patient was seen in consultation today for left chest tube drain placement Chief Complaint  Patient presents with  . loss of appetite/vomiting   at the request of Dr Leane Para  Referring Physician(s): Dr Dalbert Batman  Supervising Physician: Marybelle Killings  Patient Status: [facility] - In-pt  History of Present Illness: Roberto Owen is a 82 y.o. male   Known to IR LLQ drain intact In place---placed 08/2017 Draining stool; stays in place for now---no new plan in IR For surgery with Dr Dalbert Batman  Also noted is B loculated effusions L greater than R IMPRESSION: 1. Loculated bilateral pleural effusions, left greater than right.   2. Associated airspace disease likely reflects atelectasis. 3. Additional pleural irregularity on the left may represent infection associated with the fusion. 4. 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 Dextroconvex curvature is associated.  Dr Dalbert Batman intends to take pt to surgery soon He discussed a plan with Dr Roxy Manns Requesting left chest tube drain for now Dr Barbie Banner has reviewed imaging and spoke to Dr Dalbert Batman Dr Barbie Banner has approved procedure Set for 11/23 in IR  Past Medical History:  Diagnosis Date  . Anemia   . Bladder diverticulum 11/10/2017  . Esophageal ulcer    hx/notes 09/15/2017  . Gastric ulcer    hx/notes 09/15/2017  . GI bleed   . History of blood transfusion 07/2017  . NSAID induced gastritis     Past Surgical History:  Procedure Laterality Date  . COLONOSCOPY  ~ 05/2016  . ESOPHAGOGASTRODUODENOSCOPY (EGD) WITH PROPOFOL N/A 07/25/2017   Procedure: ESOPHAGOGASTRODUODENOSCOPY (EGD) WITH PROPOFOL;  Surgeon: Mauri Pole, MD;  Location: [facility];  Service: Endoscopy;  Laterality: N/A;  . IR CATHETER TUBE CHANGE  10/28/2017  . IR RADIOLOGIST EVAL & MGMT  09/30/2017  . IR RADIOLOGIST EVAL & MGMT  10/19/2017    Allergies: Patient has no known allergies.  Medications: Prior to Admission  medications   Medication Sig Start Date End Date Taking? Authorizing Provider  acetaminophen (TYLENOL) 500 MG tablet Take 1,000 mg by mouth every 6 (six) hours as needed for mild pain.   Yes [provider]  ferrous sulfate 325 (65 FE) MG tablet Take 1 tablet (325 mg total) by mouth daily with breakfast. 07/27/17  Yes Tawny Asal, MD  Multiple Vitamins-Minerals (ADULT ONE DAILY GUMMIES) CHEW Chew 1 tablet by mouth daily.    Yes [provider]  pantoprazole (PROTONIX) 40 MG tablet TAKE 1 TABLET BY MOUTH TWICE DAILY 09/10/17  Yes Lorella Nimrod, MD  Simethicone 180 MG CAPS Take 1 capsule every 4 (four) hours as needed by mouth (for gas, bloating).   Yes [provider]  Sod Bicarb-Ginger-Fennel-Cham (GRIPE WATER) LIQD Take 30 mLs every 4 (four) hours as needed by mouth (for gas).    Yes [provider]  traMADol (ULTRAM) 50 MG tablet Take 1 tablet (50 mg total) by mouth every 6 (six) hours as needed for moderate pain or severe pain. Patient not taking: Reported on 10/23/2017 09/21/17   Aline August, MD     Family History  Problem Relation Age of Onset  . Arthritis Mother   . Arthritis Father   . Heart disease Neg Hx   . Kidney disease Neg Hx   . Diabetes Neg Hx     Social History   Socioeconomic History  . Marital status: Divorced    Spouse name: None  . Number of children: None  . Years of education: None  .  Highest education level: None  Social Needs  . Financial resource strain: None  . Food insecurity - worry: None  . Food insecurity - inability: None  . Transportation needs - medical: None  . Transportation needs - non-medical: None  Occupational History  . None  Tobacco Use  . Smoking status: Never Smoker  . Smokeless tobacco: Never Used  Substance and Sexual Activity  . Alcohol use: No  . Drug use: No  . Sexual activity: None  Other Topics Concern  . None  Social History Narrative  . None    Review of Systems: A 12 point ROS  discussed and pertinent positives are indicated in the HPI above.  All other systems are negative.  Review of Systems  Constitutional: Negative for activity change and fever.  Respiratory: Positive for shortness of breath.   Psychiatric/Behavioral: Negative for behavioral problems and confusion.    Vital Signs: BP 129/65 (BP Location: Left Arm)   Pulse 63   Temp (!) 97.5 F (36.4 C) (Oral)   Resp (!) 27   Ht 6\' 6"  (1.981 m)   Wt 192 lb 10.9 oz (87.4 kg)   SpO2 100%   BMI 22.27 kg/m   Physical Exam  Constitutional: He is oriented to person, place, and time.  Cardiovascular: Normal rate and regular rhythm.  Pulmonary/Chest: Effort normal. He has wheezes.  Abdominal: Soft. Bowel sounds are normal.  Musculoskeletal: Normal range of motion.  Neurological: He is alert and oriented to person, place, and time.  Skin: Skin is warm and dry.  Site of drain is clean and dry OP stool like   Psychiatric: He has a normal mood and affect. His behavior is normal. Judgment and thought content normal.  Nursing note and vitals reviewed.   Imaging: Dg Chest 2 View  Result Date: 11/08/2017 CLINICAL DATA:  Pleural effusion. EXAM: CHEST  2 VIEW COMPARISON:  Radiographs of March 25, 2012. FINDINGS: Stable cardiomediastinal silhouette. No pneumothorax is noted. Right lung is clear. Mild left basilar atelectasis or infiltrate is noted. Bony thorax is unremarkable. IMPRESSION: Mild left basilar subsegmental atelectasis or infiltrate is noted. Followup PA and lateral chest X-ray is recommended in 3-4 weeks following trial of antibiotic therapy to ensure resolution and exclude underlying malignancy. Electronically Signed   By: Marijo Conception, M.D.   On: 11/08/2017 16:46   Ct Chest W Contrast  Result Date: 11/10/2017 CLINICAL DATA:  Pleural effusions. EXAM: CT CHEST WITH CONTRAST TECHNIQUE: Multidetector CT imaging of the chest was performed during intravenous contrast administration. CONTRAST:  67mL  ISOVUE-300 IOPAMIDOL (ISOVUE-300) INJECTION 61% COMPARISON:  Two-view chest x-ray 11/08/2017 FINDINGS: Cardiovascular: The heart is enlarged. Coronary artery calcifications are present. Pulmonary artery is enlarged. The main pulmonary outflow tract measures 3.9 cm. The aorta is normal in size. Mediastinum/Nodes: Hyperdense material is present within the left hilum. This is greater than expected for calcification appears be some sort of embolic material. Lungs/Pleura: A loculated left lower lobe fusion is present. There is a large portion posteriorly inferiorly. A second loculated portion along the left major fissure. Right-sided pleural fluid is less certain to be loculated. There is significant volume loss in the left greater than right lower lobes. The upper lung fields are clear. Upper Abdomen: Unremarkable. 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 Vertebral body heights are otherwise normal. Rightward curvature of the upper thoracic spine is associated with the butterfly vertebral body. No focal lytic or blastic  lesions are present. IMPRESSION: 1. Loculated bilateral pleural effusions, left greater than right. 2. Associated airspace disease likely reflects atelectasis. 3. Additional pleural irregularity on the left may represent infection associated with the fusion. 4. 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 Dextroconvex curvature is associated. Electronically Signed   By: San Morelle M.D.   On: 11/10/2017 15:19   Ct Abdomen Pelvis W Contrast  Result Date: 11/08/2017 CLINICAL DATA:  82 year old male. Left lower quadrant abscess secondary to diverticular disease in September with percutaneous drain placement. Subsequent feculent drainage, with fluoro guided injection demonstrating fistula to the colon. Drain occlusion on 10/28/2017. Up size an exchange for a new 14 [place] drain at that time.  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 Recent decreased drainage volume from catheter. Loss of appetite for 2 days with vomiting. EXAM: CT ABDOMEN AND PELVIS WITH CONTRAST TECHNIQUE: Multidetector CT imaging of the abdomen and pelvis was performed using the standard protocol following bolus administration of intravenous contrast. CONTRAST:  164mL ISOVUE-300 IOPAMIDOL (ISOVUE-300) INJECTION 61% COMPARISON:  Drain replacement images [id number]. CT Abdomen and Pelvis 09/30/2017. FINDINGS: Lower chest: New loculated appearing small to moderate size left pleural effusion with simple fluid density (series 3, image 6). Associated left lung base compressive atelectasis. No definite lower lobe or lingula consolidation. No pericardial effusion.  Stable and negative right lung base. Hepatobiliary: Stable and negative, scattered punctate calcified granulomas. Pancreas: Negative. Spleen: Negative. Adrenals/Urinary Tract: Normal adrenal glands. Bilateral renal enhancement and contrast excretion is symmetric and within normal limits. No hydronephrosis or hydroureter. 5 cm posterior right urinary bladder diverticulum appears unchanged. Stable and negative urinary bladder otherwise. Stomach/Bowel: Stable and negative rectum. Redundant sigmoid colon with persistent circumferential wall thickening in the proximal sigmoid just distal to the left abdominal percutaneous pigtail drain site (series 3, image 56). Adjacent left gutter/mesenteric stranding or trace fluid has not resolved since October and may be mildly increased (series 3, image 58). No extraluminal gas identified. The distal sigmoid appears stable and negative. The descending colon demonstrates increased distention with low-density stool to the splenic flexure. The lumen of the descending colon becomes narrow at the level of the pigtail drain and sigmoid wall thickening. See coronal images 57 52 through 58. Distal descending colon wall thickening. Redundant transverse  colon. No retained stool at the splenic flexure. Small volume of fluid in the right colon. Respiratory motion artifact in the right lower quadrant. The cecum is on a lax mesentery. The appendix is not identified today. Nondilated distal small bowel. No dilated small bowel loops. Decompressed stomach and duodenum. No other abdominal free fluid. Vascular/Lymphatic: Mild for age atherosclerosis. Major arterial structures are patent. Portal venous system appears patent. Stable lymph nodes. No lymphadenopathy. Reproductive: Negative. Other: No pelvic free fluid. Musculoskeletal: Stable.  No acute osseous abnormality identified. IMPRESSION: 1. The position of the left abdominal percutaneous pigtail catheter appears stable since October. No residual abscess but continued and perhaps progressive circumferential colonic wall thickening and mesenteric inflammation near the catheter. 2. Increased low-density stool distending the upstream left colon and splenic flexure raising the possibility of partial obstruction. 3. Consider Colonoscopy: The persistent colonic wall thickening in #1 may be inflammatory but colonic tumor is difficult to exclude. 4. New since October small to moderate size loculated left pleural effusion. Associated lung base atelectasis without definite pneumonia. Electronically Signed   By: Genevie Ann M.D.   On: 11/08/2017 15:30   Ir Catheter Tube Change  Result Date: 10/28/2017 INDICATION: 82 year old  male with a history of left lower quadrant abscess secondary to diverticular disease. Drain placed 09/16/2017. The patient has had feculent drainage, with fluoro guided injection demonstrating fistula to the colon. Current drain has become occluded. EXAM: IR CATHETER TUBE CHANGE MEDICATIONS: None ANESTHESIA/SEDATION: None COMPLICATIONS: None PROCEDURE: Informed written consent was obtained from the patient after a thorough discussion of the procedural risks, benefits and alternatives. All questions were  addressed. Maximal Sterile Barrier Technique was utilized including caps, mask, sterile gowns, sterile gloves, sterile drape, hand hygiene and skin antiseptic. A timeout was performed prior to the initiation of the procedure. Patient positioned supine position on the fluoroscopy table. The left lower quadrant and drain were prepped and draped in the usual sterile fashion. 1% lidocaine was used for local anesthesia. Attempted drain flush proved blockage of the tube. Small amount contrast entered the cavity, confirming persisting fistula. Modified Seldinger technique was used to place a new 14 [place] drain. Drain was sutured in position. Drain attached to gravity drainage. Patient tolerated the procedure well and remained hemodynamically stable throughout. No complications were encountered and no significant blood loss. IMPRESSION: Status post upsized and exchange for a new 14 French drain into a abscess cavity of the left lower quadrant. Injection confirms persisting fistula to the colon. Signed, Dulcy Fanny. Earleen Newport, DO [facility] Electronically Signed   By: Corrie Mckusick D.O.   On: 10/28/2017 12:17   Dg Sinus/fist Tube Chk-non Gi  Result Date: 10/19/2017 INDICATION: Status post percutaneous drainage of diverticular abscess on 09/16/2017. Communicating fistula to the adjacent colon at the descending/ sigmoid junction demonstrated by prior drain injection on 09/30/2017. Repeat injection is now performed to assess for persistent fistula. EXAM: INJECTION OF INDWELLING ABSCESS DRAINAGE CATHETER UNDER FLUOROSCOPY CONTRAST:  20 mL Omnipaque 300 FLUOROSCOPY TIME:  1 minute and 12 seconds.  11 mGy. MEDICATIONS: None ANESTHESIA/SEDATION: None COMPLICATIONS: None immediate. PROCEDURE: After initial fluoroscopy of the drainage catheter, contrast injection was performed via the catheter. Fluoroscopic cine loop and spot images were saved. The catheter was then  reconnected to a new gravity drainage bag. FINDINGS: Contrast injection shows initial opacification of a decompressed abscess cavity medial to the colon at the juncture of the lower descending and proximal sigmoid colon. There is almost immediate filling of a fistula communicating directly with the colonic lumen. There appears to be a single fistula demonstrated at this time. Injected contrast primarily reflux is superiorly into the descending colon with visible narrowing of the proximal sigmoid lumen present. IMPRESSION: Persistent fistula from the abscess cavity to adjacent colon with immediate filling of a fistula from a decompressed abscess cavity into the colonic lumen at the juncture of the lower descending and proximal sigmoid colon. There is visible luminal narrowing of the sigmoid colon below the level of the fistula with predominant reflux of contrast superiorly into the descending colon. Electronically Signed   By: Aletta Edouard M.D.   On: 10/19/2017 11:29   Ir Radiologist Eval & Mgmt  Result Date: 11/10/2017 Please refer to notes tab for details about interventional procedure. (Op Note)   Labs:  CBC: Recent Labs    11/08/17 1041 11/09/17 0304 11/10/17 0352 11/11/17 0319  WBC 15.3* 15.8* 12.6* 10.9*  HGB 9.0* 8.2* 8.1* 7.9*  HCT 27.5* 24.6* 24.7* 24.8*  PLT 670* 551* 599* 568*    COAGS: Recent Labs    07/23/17 1328 09/16/17 0736 11/08/17 2125  INR 1.09 1.18 1.11  APTT 27  --  33  BMP: Recent Labs    11/08/17 1041 11/09/17 0304 11/10/17 0352 11/11/17 0319  NA 129* 131* 131* 135  K 4.2 3.9 3.5 3.4*  CL 96* 101 101 103  CO2 24 21* 25 27  GLUCOSE 108* 106* 108* 102*  BUN 39* 28* 18 16  CALCIUM 8.0* 7.5* 7.4* 7.5*  CREATININE 1.33* 1.27* 1.02 1.04  GFRNONAA 49* 52* >60 >60  GFRAA 57* 60* >60 >60    LIVER FUNCTION TESTS: Recent Labs    09/17/17 0500 10/23/17 1628 11/08/17 1041 11/09/17 0304  BILITOT 0.6 0.4 0.5 0.4  AST 19 23 27 22   ALT 10* 9* 14*  12*  ALKPHOS 61 102 92 73  PROT 5.7* 7.5 7.2 6.2*  ALBUMIN 1.9* 2.5* 1.7* 1.5*    TUMOR MARKERS: No results for input(s): AFPTM, CEA, CA199, CHROMGRNA in the last 8760 hours.  Assessment and Plan:  LLQ drain in place Plans for surgery with Dr Dalbert Batman soon B loculated pleural effuusions- L greater than R Now scheduled for left chest tube drain placement Risks and benefits discussed with the patient including bleeding, infection, damage to adjacent structures,  and sepsis. All of the patient's questions were answered, patient is agreeable to proceed. Consent signed and in chart.   Thank you for this interesting consult.  I greatly enjoyed meeting Mycheal Veldhuizen and look forward to participating in their care.  A copy of this report was sent to the requesting provider on this date.  Electronically Signed: Lavonia Drafts, PA-C 11/11/2017, 10:30 AM   I spent a total of 40 Minutes    in face to face in clinical consultation, greater than 50% of which was counseling/coordinating care for left chest tube placement

## 2017-11-11 NOTE — Progress Notes (Signed)
Family Medicine Teaching Service Daily Progress Note Intern Pager: [phone]  Patient name: Roberto Owen Medical record number: [id number] Date of birth: 1935/06/02 Age: 82 y.o. Gender: male  Primary Care Provider: Patient, No Pcp Per Consultants: Surgery, IR, Cardiology Code Status: FULL  Pt Overview and Major Events to Date:  11/19 - admitted for GI spasms, LOA IR placed abdominal PERC drain for abscess  Assessment and Plan: Roberto Owen is a 82 y.o. male presenting with an in place abdominal drain due to previous episode of diverticulitis with abscess of the sigmoid colon and subsequent development of colonic fistula. PMH is significant for Peptic ulcer disease, history of GI bleed and previous episode of diverticulosis.  Sigmoid colon thickening/inflammation with fistulas:  Patient without abdominal pain this a.m. and has an appetite. He continues to have thick feculent drainage from around the drain insertion site. - Per Surgery, may have liquid diet today (11/11/17), as tentative abdominal surgery would not happen until next week - Vanc/Zosyn dosed per pharmacy (Day 4, started on 11/19) - Gripe water to help with GI spasms - Surgery is following. Anticipating exploratory laparotomy, colectomy, and colostomy once pleural effusions addressed.  - IR consulted and recommended no increase in the size of the drain or drain injection and that drain is not controlling his fistula.  - D/c fluids as now has diet - baclofen PRN spasm - tylenol PRN - zofran PRN  Loculated Left Pleural Effusion: Found on CT Abd and suspicous for PNA. CT chest obtained 11/21 and showed bilateral loculated pleural effusions. WBC trending down.  - CVTS consulted 11/20, recommended full chest CT and IR consult - Will consult IR --> plan for chest tube placement 11/23 - Continue Vanc/Zosyn for HCAP coverage - Continuous pulse ox - Spiometry q1hr  New Onset Afib: New onset, with RVR in ED s/p Cardizem  bolus and drip with no significant rate control, subsequently started on amiodarone per Cardiology. He has no previous cardiac history.  Possibly precipitated by infection. Trop 0.01>0.04>0.03. TSH 0.271, low. Electrolytes are normal. No chest pain. Chads vasc score of 2. Hasbled score of 1. Has a history of peptic ulcer disease. Blood pressure stable. ECHO 11/21 with EF of 45-50% with diffuse hypokinesis and PA pressure moderately increased.  - Continue Heparin; consider transition to eliquis in future per Cards but would want GI clearance first - Transitioned to PO amiodarone 200 mg BID, per Cards - Will decrease metoprolol to 12.5 mg BID from 25 mg BID, per cards for lower heart rates  AKI: Improving, Creatinine 1.33>1.27>1.02,>1.04 returning to baseline of  ~0.8.   - Continue to follow BMP for resolution of AKI  Hyponatremia: Resolved.  Likely due to dehydration.  Thrombocytosis: Platelets of 568.  It is not chronic.  Likely due to acute infection. - Monitor   Anemia: Hemoglobin 7.9, down from 9 which is at baseline.  No signs of overt bleeding. - Monitor with labs  FEN/GI: Protonix, liquid diet but NPO at midnight for chest tube placement 11/23 Prophylaxis: Heparin  Disposition: continue inpatient management of GI spasms  Subjective:  Patient feels well this am and now has an appetite. Denies abdominal pain but some pain with deep inspiration though does not feel short of breath. He was very happy to get the go ahead to eat today from Surgery.   Objective: Temp:  [97.7 F (36.5 C)-98.4 F (36.9 C)] 97.7 F (36.5 C) (11/22 0444) Pulse Rate:  [32-69] 65 (11/21 2309) Resp:  [11-23] 18 (11/21 1900)  BP: (106-147)/(57-87) 107/57 (11/21 2309) SpO2:  [93 %-100 %] 100 % (11/21 1900)  Physical Exam: Gen: Alert and Oriented x 3, NAD HEENT: Normocephalic, atraumatic, PERRLA, EOMI CV: Irregular rhythm, normal rhythm, no murmurs, normal S1, S2 split Resp: decreased breath sounds  bilaterally, especially over left lower lung field, no wheezing, rales, or rhonchi, comfortable work of breathing Abd: non-distended, non-tender, soft, +bs in all four quadrants MSK: FROM in all four extremities Ext: no clubbing, cyanosis, or edema Skin: warm, dry, intact, no rashes  Laboratory: Recent Labs  Lab 11/09/17 0304 11/10/17 0352 11/11/17 0319  WBC 15.8* 12.6* 10.9*  HGB 8.2* 8.1* 7.9*  HCT 24.6* 24.7* 24.8*  PLT 551* 599* 568*   Recent Labs  Lab 11/08/17 1041 11/09/17 0304 11/10/17 0352 11/11/17 0319  NA 129* 131* 131* 135  K 4.2 3.9 3.5 3.4*  CL 96* 101 101 103  CO2 24 21* 25 27  BUN 39* 28* 18 16  CREATININE 1.33* 1.27* 1.02 1.04  CALCIUM 8.0* 7.5* 7.4* 7.5*  PROT 7.2 6.2*  --   --   BILITOT 0.5 0.4  --   --   ALKPHOS 92 73  --   --   ALT 14* 12*  --   --   AST 27 22  --   --   GLUCOSE 108* 106* 108* 102*   11/19 - Lipase: 19 11/19 - Lactic Acid: 1.52, 1.15 11/20 - Troponin I: 0.03,  11/20 - TSH: 0.271 (low)  Imaging/Diagnostic Tests:  Dg Chest 2 View  Result Date: 11/08/2017 IMPRESSION: Mild left basilar subsegmental atelectasis or infiltrate is noted. Followup PA and lateral chest X-ray is recommended in 3-4 weeks following trial of antibiotic therapy to ensure resolution and exclude underlying malignancy. Electronically Signed   By: Marijo Conception, M.D.   On: 11/08/2017 16:46   Ct Chest W Contrast  Result Date: 11/10/2017 IMPRESSION: 1. Loculated bilateral pleural effusions, left greater than right. 2. Associated airspace disease likely reflects atelectasis. 3. Additional pleural irregularity on the left may represent infection associated with the fusion. 4. 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 Dextroconvex curvature is associated. Electronically Signed   By: San Morelle M.D.   On: 11/10/2017 15:19   Ct Abdomen Pelvis W Contrast  Result Date: 11/08/2017 IMPRESSION: 1. The position of the left abdominal  percutaneous pigtail catheter appears stable since October. No residual abscess but continued and perhaps progressive circumferential colonic wall thickening and mesenteric inflammation near the catheter. 2. Increased low-density stool distending the upstream left colon and splenic flexure raising the possibility of partial obstruction. 3. Consider Colonoscopy: The persistent colonic wall thickening in #1 may be inflammatory but colonic tumor is difficult to exclude. 4. New since October small to moderate size loculated left pleural effusion. Associated lung base atelectasis without definite pneumonia. Electronically Signed   By: Genevie Ann M.D.   On: 11/08/2017 15:30    Rogue Bussing, MD 11/11/2017, 7:50 AM PGY-3, [place] Intern pager: [phone], text pages welcome

## 2017-11-12 ENCOUNTER — Inpatient Hospital Stay (HOSPITAL_COMMUNITY): Payer: Medicare Other

## 2017-11-12 DIAGNOSIS — K5792 Diverticulitis of intestine, part unspecified, without perforation or abscess without bleeding: Secondary | ICD-10-CM

## 2017-11-12 LAB — BASIC METABOLIC PANEL
Anion gap: 4 — ABNORMAL LOW (ref 5–15)
BUN: 13 mg/dL (ref 6–20)
CO2: 26 mmol/L (ref 22–32)
Calcium: 7.6 mg/dL — ABNORMAL LOW (ref 8.9–10.3)
Chloride: 106 mmol/L (ref 101–111)
Creatinine, Ser: 1.04 mg/dL (ref 0.61–1.24)
GFR calc Af Amer: >60 mL/min (ref >60)
Glucose, Bld: 118 mg/dL — ABNORMAL HIGH (ref 65–99)
POTASSIUM: 4.6 mmol/L (ref 3.5–5.1)
SODIUM: 136 mmol/L (ref 135–145)

## 2017-11-12 LAB — CBC
HCT: 24.8 % — ABNORMAL LOW (ref 39.0–52.0)
Hemoglobin: 8 g/dL — ABNORMAL LOW (ref 13.0–17.0)
MCH: 23.8 pg — AB (ref 26.0–34.0)
MCHC: 32.3 g/dL (ref 30.0–36.0)
MCV: 73.8 fL — ABNORMAL LOW (ref 78.0–100.0)
PLATELETS: 525 10*3/uL — AB (ref 150–400)
RBC: 3.36 MIL/uL — AB (ref 4.22–5.81)
RDW: 16.2 % — ABNORMAL HIGH (ref 11.5–15.5)
WBC: 11.1 10*3/uL — AB (ref 4.0–10.5)

## 2017-11-12 LAB — HEPARIN LEVEL (UNFRACTIONATED): HEPARIN UNFRACTIONATED: 0.31 [IU]/mL (ref 0.30–0.70)

## 2017-11-12 MED ORDER — MIDAZOLAM HCL 2 MG/2ML IJ SOLN
INTRAMUSCULAR | Status: AC | PRN
  Administered 2017-11-12: 1 mg via INTRAVENOUS

## 2017-11-12 MED ORDER — HEPARIN (PORCINE) IN NACL 100-0.45 UNIT/ML-% IJ SOLN
1600.0000 [IU]/h | INTRAMUSCULAR | Status: DC
  Administered 2017-11-12 – 2017-11-14 (×4): 1600 [IU]/h via INTRAVENOUS
  Filled 2017-11-12 (×3): qty 250

## 2017-11-12 MED ORDER — ENSURE ENLIVE PO LIQD
237.0000 mL | Freq: Two times a day (BID) | ORAL | Status: DC
  Administered 2017-11-12 – 2017-11-13 (×2): 237 mL via ORAL

## 2017-11-12 MED ORDER — MIDAZOLAM HCL 2 MG/2ML IJ SOLN
INTRAMUSCULAR | Status: AC
  Filled 2017-11-12: qty 4

## 2017-11-12 MED ORDER — LIDOCAINE HCL 1 % IJ SOLN
INTRAMUSCULAR | Status: AC
  Filled 2017-11-12: qty 20

## 2017-11-12 MED ORDER — BACLOFEN 10 MG PO TABS
10.0000 mg | ORAL_TABLET | Freq: Three times a day (TID) | ORAL | Status: DC
  Administered 2017-11-12 – 2017-11-14 (×7): 10 mg via ORAL
  Filled 2017-11-12 (×7): qty 1

## 2017-11-12 MED ORDER — SODIUM CHLORIDE 0.9 % IV SOLN
INTRAVENOUS | Status: AC | PRN
  Administered 2017-11-12: 10 mL/h via INTRAVENOUS

## 2017-11-12 MED ORDER — FENTANYL CITRATE (PF) 100 MCG/2ML IJ SOLN
INTRAMUSCULAR | Status: AC
  Filled 2017-11-12: qty 4

## 2017-11-12 MED ORDER — FENTANYL CITRATE (PF) 100 MCG/2ML IJ SOLN
INTRAMUSCULAR | Status: AC | PRN
  Administered 2017-11-12: 50 ug via INTRAVENOUS

## 2017-11-12 NOTE — Evaluation (Signed)
Physical Therapy Evaluation Patient Details Name: Roberto Owen MRN: [id number] DOB: [birth year] Today's Date: 11/12/2017   History of Present Illness  82 year old male with a known peptic ulcer disease, history of GI bleed, diverticulosis who presents with draining around indwelling catheter secondary to diverticulitis with abscess who was brought to the ED with increasing weight loss, GI spasms, cough plus leaking around his diverticular catheter site.  Clinical Impression  Pt self limiting this date to chair however demo'd capability to be able to ambulate in the hallway. Pt presenting with generalized weakness due to hospital stay however anticipate pt to progress well and be able to d/c home with family once medically stable.    Follow Up Recommendations No PT follow up;Supervision/Assistance - 24 hour    Equipment Recommendations  Rolling walker with 5" wheels    Recommendations for Other Services       Precautions / Restrictions Precautions Precautions: Fall Restrictions Weight Bearing Restrictions: No      Mobility  Bed Mobility Overal bed mobility: Needs Assistance Bed Mobility: Supine to Sit     Supine to sit: Min assist;HOB elevated     General bed mobility comments: pt asked PT to bring pt to long sit, pt then proceeded to bring LEs off EOB and was able to scoot to EOB. Pt reports "i have things to pull up on at home"  Transfers Overall transfer level: Needs assistance Equipment used: Rolling walker (2 wheeled) Transfers: Sit to/from Stand Sit to Stand: Min assist         General transfer comment: v/c's to push up from bed, minA to steady pt during transition of hands, and to achieve full upright standing  Ambulation/Gait Ambulation/Gait assistance: Min guard Ambulation Distance (Feet): 5 Feet Assistive device: Rolling walker (2 wheeled) Gait Pattern/deviations: Step-to pattern Gait velocity: slow Gait velocity interpretation: Below normal speed for  age/gender General Gait Details: pt selft limiting to the chair as he was adamant about "i need to try to sit up first before I walk" pt did well and demo'd capability of ambulating in hallway however despite max encouragement pt would only amb to chair  Stairs            Wheelchair Mobility    Modified Rankin (Stroke Patients Only)       Balance Overall balance assessment: Needs assistance Sitting-balance support: Feet supported;No upper extremity supported Sitting balance-Leahy Scale: Fair     Standing balance support: Bilateral upper extremity supported Standing balance-Leahy Scale: Poor Standing balance comment: pt dependent on RW at this time                             Pertinent Vitals/Pain Pain Assessment: No/denies pain    Home Living Family/patient expects to be discharged to:: Private residence Living Arrangements: Children Available Help at Discharge: Family;Available 24 hours/day Type of Home: House Home Access: Stairs to enter Entrance Stairs-Rails: Can reach both Entrance Stairs-Number of Steps: 5 Home Layout: One level Home Equipment: Cane - single point Additional Comments: pt reports not showering due to drain and that he has hired help to "wash up"    Prior Function Level of Independence: Independent with assistive device(s)         Comments: uses cane for ambulation, reports he was indept     Hand Dominance   Dominant Hand: Right    Extremity/Trunk Assessment   Upper Extremity Assessment Upper Extremity Assessment: Generalized weakness    Lower  Extremity Assessment Lower Extremity Assessment: Generalized weakness    Cervical / Trunk Assessment Cervical / Trunk Assessment: Normal  Communication   Communication: No difficulties  Cognition Arousal/Alertness: Awake/alert Behavior During Therapy: WFL for tasks assessed/performed Overall Cognitive Status: Within Functional Limits for tasks assessed                                  General Comments: pt very knowledgable with medical field and dictates treatment. Pt refused ambulation because he "needs to get acclamated to sitting first"      General Comments      Exercises General Exercises - Lower Extremity Ankle Circles/Pumps: AROM;Both;10 reps;Seated Long Arc Quad: AROM;Both;10 reps;Seated   Assessment/Plan    PT Assessment Patient needs continued PT services  PT Problem List Decreased strength;Decreased activity tolerance;Decreased balance;Decreased mobility;Decreased knowledge of use of DME       PT Treatment Interventions DME instruction;Stair training;Gait training;Functional mobility training;Therapeutic activities;Therapeutic exercise;Balance training    PT Goals (Current goals can be found in the Care Plan section)  Acute Rehab PT Goals Patient Stated Goal: didn't state PT Goal Formulation: With patient Time For Goal Achievement: 11/19/17 Potential to Achieve Goals: Good    Frequency Min 3X/week   Barriers to discharge        Co-evaluation               AM-PAC PT "6 Clicks" Daily Activity  Outcome Measure Difficulty turning over in bed (including adjusting bedclothes, sheets and blankets)?: Unable Difficulty moving from lying on back to sitting on the side of the bed? : Unable Difficulty sitting down on and standing up from a chair with arms (e.g., wheelchair, bedside commode, etc,.)?: Unable Help needed moving to and from a bed to chair (including a wheelchair)?: A Little Help needed walking in hospital room?: A Little Help needed climbing 3-5 steps with a railing? : A Lot 6 Click Score: 11    End of Session Equipment Utilized During Treatment: Gait belt Activity Tolerance: (self limiting) Patient left: in chair;with call bell/phone within reach;with nursing/sitter in room Nurse Communication: Mobility status PT Visit Diagnosis: Unsteadiness on feet (R26.81);Muscle weakness (generalized) (M62.81)     Time: 7628-3151 PT Time Calculation (min) (ACUTE ONLY): 31 min   Charges:   PT Evaluation $PT Eval Moderate Complexity: 1 Mod PT Treatments $Therapeutic Activity: 8-22 mins   PT G Codes:        Roberto Owen, PT, DPT Pager #: [phone] Office #: [phone]   Roberto Owen Roberto Owen 11/12/2017, 10:35 AM

## 2017-11-12 NOTE — Progress Notes (Signed)
Traveled with patient back to unit and hooked patient up to wall suction at 49mmHg. Report given to [facility].

## 2017-11-12 NOTE — Sedation Documentation (Signed)
Patient is resting comfortably. 

## 2017-11-12 NOTE — Progress Notes (Addendum)
Nutrition Consult / Follow-up  DOCUMENTATION CODES:   Severe malnutrition in context of chronic illness  INTERVENTION:    When diet advanced, start Ensure Enlive PO BID, each supplement provides 350 kcal and 20 grams of protein  Patient may require TPN if prolonged NPO status required after upcoming bowel surgery.   NUTRITION DIAGNOSIS:   Severe Malnutrition related to chronic illness(altered GI function) as evidenced by severe muscle depletion, severe fat depletion, percent weight loss(8% in 3 months).  Ongoing  GOAL:   Patient will meet greater than or equal to 90% of their needs  Unmet  MONITOR:   Diet advancement, Labs, Weight trends, I & O's  REASON FOR ASSESSMENT:   Consult Assessment of nutrition requirement/status  ASSESSMENT:   Pt with PMH of amenia, peptic ulcer disease, hx of GI bleed, diverticulosis presents with colonic obstruction and loculated pleural effusion possibly causing persistent hiccups  Spoke with patient and RN.  Diet advanced to full liquids on 11/22, then made NPO today for procedure, percutaneous pigtail catheter drainage of loculated left pleural effusion today in radiology. Patient reports that he ate very well yesterday; the only thing he did not tolerate was the pudding. He also drank an Ensure supplement yesterday. Potential for exploratory laparotomy with left colectomy and colostomy during this hospitalization. Labs and medications reviewed.  Diet Order:  Diet NPO time specified Except for: Sips with Meds  EDUCATION NEEDS:   No education needs have been identified at this time  Skin:  Skin Assessment: Reviewed RN Assessment  Last BM:  11/23  Height:   Ht Readings from Last 1 Encounters:  11/09/17 6\' 6"  (1.981 m)    Weight:   Wt Readings from Last 1 Encounters:  11/09/17 192 lb 10.9 oz (87.4 kg)    Ideal Body Weight:  97 kg  BMI:  Body mass index is 22.27 kg/m.  Estimated Nutritional Needs:   Kcal:   2100-2300  Protein:  105-115 grams  Fluid:  >/= 2 L/d   Roberto Owen, RD, LDN, [place] Pager [phone] After Hours Pager [phone]

## 2017-11-12 NOTE — Procedures (Signed)
CT guided placement of 12 Fr drain in loculated left pleural effusion.  Aspirated clear yellow fluid.  Will send fluid for culture.  Minimal blood loss and no immediate complication.  Will place drain to wall suction.

## 2017-11-12 NOTE — Progress Notes (Signed)
Family Medicine Teaching Service Daily Progress Note Intern Pager: [phone]  Patient name: Roberto Owen Medical record number: [id number] Date of birth: September 23, 1935 Age: 82 y.o. Gender: male  Primary Care Provider: Patient, No Pcp Per Consultants: Surgery, IR, Cardiology Code Status: FULL  Pt Overview and Major Events to Date:  11/19 - admitted for GI spasms, LOA 11/22 - allowed to eat liquid diet per Surgery 11/23 - plan for left chest tube placement by IR  Assessment and Plan: Roberto Owen is a 82 y.o. male presenting with an in place abdominal drain due to previous episode of diverticulitis with abscess of the sigmoid colon and subsequent development of colonic fistula. PMH is significant for Peptic ulcer disease, history of GI bleed and previous episode of diverticulosis.  Sigmoid colon thickening/inflammation with fistulas:  Patient without abdominal pain this a.m. and has an appetite. He continues to have thick feculent drainage from around the drain insertion site. - Per Surgery, may have full liquid diet after chest tube placement today, 11/23. Plan for surgery next week by Dr. Donne Hazel. - Vanc/Zosyn dosed per pharmacy (Day 5, started on 11/19) - Gripe water to help with GI spasms; baclofen for hiccups - Surgery is following. Anticipating exploratory laparotomy, colectomy, and colostomy once pleural effusions addressed.  - IR consulted and recommended no increase in the size of the drain or drain injection and that drain is not controlling his fistula.  - baclofen PRN spasm - tylenol PRN - zofran PRN  Loculated Left Pleural Effusion: Found on CT Abd and suspicous for PNA. CT chest obtained 11/21 and showed bilateral loculated pleural effusions. WBC stable at 11, admitted at 15.3.  - CVTS consulted 11/20, recommended full chest CT and IR consult - IR consulted 11/22, plan for chest tube placement 11/23 - Continue Vanc/Zosyn for HCAP coverage - Continuous pulse ox -  Spiometry q1hr  New Onset Afib: New onset, with RVR in ED s/p Cardizem bolus and drip with no significant rate control, subsequently started on amiodarone per Cardiology. He has no previous cardiac history.  Possibly precipitated by infection. Trop 0.01>0.04>0.03. TSH 0.271, low. Electrolytes are normal. No chest pain. Chads vasc score of 2. Hasbled score of 1. Has a history of peptic ulcer disease. Blood pressure stable. ECHO 11/21 with EF of 45-50% with diffuse hypokinesis and PA pressure moderately increased.  - Continue Heparin; consider transition to eliquis in future per Cards but would want GI clearance first - Transitioned to PO amiodarone 200 mg BID, per Cards; anticipate reducing to daily in several days - Metoprolol decreased to 12.5 mg BID from 25 mg BID 11/22, per cards for lower heart rates  AKI: Improving, Creatinine 1.33>1.27>1.02 >1.04 > 1.04, returning to baseline of  ~0.8.   - Continue to follow BMP for resolution of AKI  Hyponatremia: Resolved.  Likely due to dehydration.  Thrombocytosis: Platelets in 500s.  It is not chronic.  Likely due to acute infection. - Monitor   Anemia: Hemoglobin stable at 8.0, BL ~ 9.0.  No signs of overt bleeding. - Monitor with labs  FEN/GI: Protonix, NPO for chest tube placement 11/23 then order full liquid diet Prophylaxis: Heparin  Disposition: continue inpatient management of GI spasms  Subjective:  Patient feels well this am but having GI spasms and hiccups. Denies abdominal pain. Enjoyed eating yesterday.  Objective: Temp:  [97.5 F (36.4 C)-98.3 F (36.8 C)] 98.3 F (36.8 C) (11/22 2300) Pulse Rate:  [63-78] 78 (11/22 2124) Resp:  [21-27] 25 (11/22 2300) BP: (105-134)/(53-75)  116/53 (11/22 2300) SpO2:  [99 %-100 %] 100 % (11/22 1900)  Physical Exam: Gen: Alert and Oriented x 3, NAD CV: Irregular rhythm, normal rate, no murmurs Resp: decreased breath sounds bilaterally, especially over left lower lung field, no  increased WOB Abd: non-distended, non-tender, soft, +bs in all four quadrants; LLQ drain in place - covered with dressing MSK: FROM in all four extremities Ext: no clubbing, cyanosis, or edema Skin: warm, dry, intact, no rashes  Laboratory: Recent Labs  Lab 11/10/17 0352 11/11/17 0319 11/12/17 0356  WBC 12.6* 10.9* 11.1*  HGB 8.1* 7.9* 8.0*  HCT 24.7* 24.8* 24.8*  PLT 599* 568* 525*   Recent Labs  Lab 11/08/17 1041 11/09/17 0304 11/10/17 0352 11/11/17 0319 11/12/17 0356  NA 129* 131* 131* 135 136  K 4.2 3.9 3.5 3.4* 4.6  CL 96* 101 101 103 106  CO2 24 21* 25 27 26   BUN 39* 28* 18 16 13   CREATININE 1.33* 1.27* 1.02 1.04 1.04  CALCIUM 8.0* 7.5* 7.4* 7.5* 7.6*  PROT 7.2 6.2*  --   --   --   BILITOT 0.5 0.4  --   --   --   ALKPHOS 92 73  --   --   --   ALT 14* 12*  --   --   --   AST 27 22  --   --   --   GLUCOSE 108* 106* 108* 102* 118*   11/19 - Lipase: 19 11/19 - Lactic Acid: 1.52, 1.15 11/20 - Troponin I: 0.03,  11/20 - TSH: 0.271 (low)  Imaging/Diagnostic Tests:  Dg Chest 2 View  Result Date: 11/08/2017 IMPRESSION: Mild left basilar subsegmental atelectasis or infiltrate is noted. Followup PA and lateral chest X-ray is recommended in 3-4 weeks following trial of antibiotic therapy to ensure resolution and exclude underlying malignancy. Electronically Signed   By: Marijo Conception, M.D.   On: 11/08/2017 16:46   Ct Chest W Contrast  Result Date: 11/10/2017 IMPRESSION: 1. Loculated bilateral pleural effusions, left greater than right. 2. Associated airspace disease likely reflects atelectasis. 3. Additional pleural irregularity on the left may represent infection associated with the fusion. 4. 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 Dextroconvex curvature is associated. Electronically Signed   By: San Morelle M.D.   On: 11/10/2017 15:19   Ct Abdomen Pelvis W Contrast  Result Date: 11/08/2017 IMPRESSION: 1. The position of the  left abdominal percutaneous pigtail catheter appears stable since October. No residual abscess but continued and perhaps progressive circumferential colonic wall thickening and mesenteric inflammation near the catheter. 2. Increased low-density stool distending the upstream left colon and splenic flexure raising the possibility of partial obstruction. 3. Consider Colonoscopy: The persistent colonic wall thickening in #1 may be inflammatory but colonic tumor is difficult to exclude. 4. New since October small to moderate size loculated left pleural effusion. Associated lung base atelectasis without definite pneumonia. Electronically Signed   By: Genevie Ann M.D.   On: 11/08/2017 15:30    Rogue Bussing, MD 11/12/2017, 6:21 AM PGY-3, [place] Intern pager: [phone], text pages welcome

## 2017-11-12 NOTE — Progress Notes (Signed)
Subjective:  Patient denies any chest pain or shortness of breath. Denies palpitation. Remains in sinus rhythm with occasional PVCs on the monitor. Started on clear liquids tolerating okay although hand significant nausea and a hiccups no vomiting, no abdominal pain  Objective:  Vital Signs in the last 24 hours: Temp:  [97.5 F (36.4 C)-98.3 F (36.8 C)] 98.3 F (36.8 C) (11/22 2300) Pulse Rate:  [63-78] 78 (11/22 2124) Resp:  [21-27] 25 (11/22 2300) BP: (105-134)/(53-75) 116/53 (11/22 2300) SpO2:  [99 %-100 %] 100 % (11/22 1900)  Intake/Output from previous day: 11/22 0701 - 11/23 0700 In: 616 [P.O.:240; I.V.:176; IV Piggyback:200] Out: 200 [Urine:200] Intake/Output from this shift: No intake/output data recorded.  Physical Exam: Exam unchanged  Lab Results: Recent Labs    11/11/17 0319 11/12/17 0356  WBC 10.9* 11.1*  HGB 7.9* 8.0*  PLT 568* 525*   Recent Labs    11/11/17 0319 11/12/17 0356  NA 135 136  K 3.4* 4.6  CL 103 106  CO2 27 26  GLUCOSE 102* 118*  BUN 16 13  CREATININE 1.04 1.04   No results for input(s): TROPONINI in the last 72 hours.  Invalid input(s): CK, MB Hepatic Function Panel No results for input(s): PROT, ALBUMIN, AST, ALT, ALKPHOS, BILITOT, BILIDIR, IBILI in the last 72 hours. No results for input(s): CHOL in the last 72 hours. No results for input(s): PROTIME in the last 72 hours.  Imaging: Imaging results have been reviewed and Ct Chest W Contrast  Result Date: 11/10/2017 CLINICAL DATA:  Pleural effusions. EXAM: CT CHEST WITH CONTRAST TECHNIQUE: Multidetector CT imaging of the chest was performed during intravenous contrast administration. CONTRAST:  98mL ISOVUE-300 IOPAMIDOL (ISOVUE-300) INJECTION 61% COMPARISON:  Two-view chest x-ray 11/08/2017 FINDINGS: Cardiovascular: The heart is enlarged. Coronary artery calcifications are present. Pulmonary artery is enlarged. The main pulmonary outflow tract measures 3.9 cm. The aorta is normal  in size. Mediastinum/Nodes: Hyperdense material is present within the left hilum. This is greater than expected for calcification appears be some sort of embolic material. Lungs/Pleura: A loculated left lower lobe fusion is present. There is a large portion posteriorly inferiorly. A second loculated portion along the left major fissure. Right-sided pleural fluid is less certain to be loculated. There is significant volume loss in the left greater than right lower lobes. The upper lung fields are clear. Upper Abdomen: Unremarkable. 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 Vertebral body heights are otherwise normal. Rightward curvature of the upper thoracic spine is associated with the butterfly vertebral body. No focal lytic or blastic lesions are present. IMPRESSION: 1. Loculated bilateral pleural effusions, left greater than right. 2. Associated airspace disease likely reflects atelectasis. 3. Additional pleural irregularity on the left may represent infection associated with the fusion. 4. 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 Dextroconvex curvature is associated. Electronically Signed   By: San Morelle M.D.   On: 11/10/2017 15:19    Cardiac Studies:  Assessment/Plan:  Status postNew onset A. fib/flutter with RVR chads score of 2 Hypertension Colonic diverticulitis with perforation and status post partial colon obstruction History of peptic ulcer disease secondary to NSAIDs History of GI bleed in the past History of hematuria in the past Chronic anemia secondary to above Bilateral pleural effusion Plan Continue present management will reduce amiodarone dose to 200 mg daily after a few days.   LOS: 4 days    Charolette Forward 11/12/2017, 7:08  AM

## 2017-11-12 NOTE — Progress Notes (Signed)
ANTICOAGULATION CONSULT NOTE - Follow Up Consult  Pharmacy Consult for Heparin Indication: atrial fibrillation  No Known Allergies  Patient Measurements: Height: 6\' 6"  (198.1 cm) Weight: 192 lb 10.9 oz (87.4 kg) IBW/kg (Calculated) : 91.4  Vital Signs: Temp: 97.9 F (36.6 C) (11/23 1445) Temp Source: Oral (11/23 1445) BP: 134/76 (11/23 1445) Pulse Rate: 77 (11/23 1445)  Labs: Recent Labs    11/10/17 0352 11/11/17 0319 11/12/17 0356  HGB 8.1* 7.9* 8.0*  HCT 24.7* 24.8* 24.8*  PLT 599* 568* 525*  HEPARINUNFRC 0.30 0.45 0.31  CREATININE 1.02 1.04 1.04    Estimated Creatinine Clearance: 70 mL/min (by C-G formula based on SCr of 1.04 mg/dL).   Medications:  Infusions:  . heparin    . piperacillin-tazobactam (ZOSYN)  IV Stopped (11/12/17 1015)  . vancomycin Stopped (11/12/17 0439)    Assessment: 82 year old male admitted with diverticular abscess and colonic fistula. He has new atrial fibrillation and is on anticoagulation with IV heparin. Note he has a history of GI bleed due to NSAIDs in August 2018.   His heparin level was therapeutic (0.31) today on 1600 units/hr. CBC low stable. No bleeding reported. Heparin was off for pleural effusion drainage and can be restarted at 3pm per radiology orders.  Goal of Therapy:  Heparin level 0.3-0.7 units/ml Monitor platelets by anticoagulation protocol: Yes   Plan:  Restart Heparin at 1600 units/hr Daily heparin level and CBC Monitor for bleeding complications  Norva Riffle 11/12/2017,2:47 PM

## 2017-11-12 NOTE — Progress Notes (Signed)
Subjective: Says he's better.  No chest pain or shortness of breath.  Denies pain Has hiccups and is coughing up clear mucus. Is still draining stool brown left lower quadrant drain.  Nothing further to do in IR.  Heart rate under good control now... On heparin drip, oral amiodarone On IV Zosyn and vancomycin  Scheduled for percutaneous pigtail catheter drainage of loculated left pleural effusion this morning in radiology  Hemoglobin 8.0, stable.  WBC 11,100, stable.  Creatinine 1.4.  Objective: Vital signs in last 24 hours: Temp:  [97.7 F (36.5 C)-98.7 F (37.1 C)] 98.7 F (37.1 C) (11/23 0721) Pulse Rate:  [68-78] 69 (11/23 0721) Resp:  [18-25] 18 (11/23 0721) BP: (105-138)/(53-75) 138/73 (11/23 0721) SpO2:  [99 %-100 %] 100 % (11/23 0721) Last BM Date: 11/12/17  Intake/Output from previous day: 11/22 0701 - 11/23 0700 In: 616 [P.O.:240; I.V.:176; IV Piggyback:200] Out: 200 [Urine:200] Intake/Output this shift: No intake/output data recorded.    PE: Gen: Alert, NAD, pleasant Card:HR 55, pedal pulses 2+ BL Pulm: Normal effort, bibasilar rales-faint Abd: Soft, non-tender, non-distended, bowel sounds present, no HSM,LLQ drain with feculent drainage and small amount drainage around tube Skin: warm and dry, no rashes  Psych: A&Ox3      Lab Results:  Recent Labs    11/11/17 0319 11/12/17 0356  WBC 10.9* 11.1*  HGB 7.9* 8.0*  HCT 24.8* 24.8*  PLT 568* 525*   BMET Recent Labs    11/11/17 0319 11/12/17 0356  NA 135 136  K 3.4* 4.6  CL 103 106  CO2 27 26  GLUCOSE 102* 118*  BUN 16 13  CREATININE 1.04 1.04  CALCIUM 7.5* 7.6*   PT/INR No results for input(s): LABPROT, INR in the last 72 hours. ABG No results for input(s): PHART, HCO3 in the last 72 hours.  Invalid input(s): PCO2, PO2  Studies/Results: Ct Chest W Contrast  Result Date: 11/10/2017 CLINICAL DATA:  Pleural effusions. EXAM: CT CHEST WITH CONTRAST TECHNIQUE: Multidetector CT  imaging of the chest was performed during intravenous contrast administration. CONTRAST:  39mL ISOVUE-300 IOPAMIDOL (ISOVUE-300) INJECTION 61% COMPARISON:  Two-view chest x-ray 11/08/2017 FINDINGS: Cardiovascular: The heart is enlarged. Coronary artery calcifications are present. Pulmonary artery is enlarged. The main pulmonary outflow tract measures 3.9 cm. The aorta is normal in size. Mediastinum/Nodes: Hyperdense material is present within the left hilum. This is greater than expected for calcification appears be some sort of embolic material. Lungs/Pleura: A loculated left lower lobe fusion is present. There is a large portion posteriorly inferiorly. A second loculated portion along the left major fissure. Right-sided pleural fluid is less certain to be loculated. There is significant volume loss in the left greater than right lower lobes. The upper lung fields are clear. Upper Abdomen: Unremarkable. 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 Vertebral body heights are otherwise normal. Rightward curvature of the upper thoracic spine is associated with the butterfly vertebral body. No focal lytic or blastic lesions are present. IMPRESSION: 1. Loculated bilateral pleural effusions, left greater than right. 2. Associated airspace disease likely reflects atelectasis. 3. Additional pleural irregularity on the left may represent infection associated with the fusion. 4. 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 Dextroconvex curvature is associated. Electronically Signed   By: San Morelle M.D.   On: 11/10/2017 15:19    Anti-infectives: Anti-infectives (From admission, onward)   Start     Dose/Rate Route Frequency Ordered Stop  11/11/17 1600  vancomycin (VANCOCIN) IVPB 1000 mg/200 mL premix     1,000 mg 200 mL/hr over 60 Minutes Intravenous Every 12 hours 11/11/17 0443     11/09/17 1600  vancomycin  (VANCOCIN) IVPB 750 mg/150 ml premix  Status:  Discontinued     750 mg 150 mL/hr over 60 Minutes Intravenous Every 12 hours 11/09/17 1500 11/11/17 0443   11/09/17 0800  vancomycin (VANCOCIN) IVPB 750 mg/150 ml premix  Status:  Discontinued     750 mg 150 mL/hr over 60 Minutes Intravenous Every 12 hours 11/08/17 2152 11/09/17 1500   11/09/17 0200  piperacillin-tazobactam (ZOSYN) IVPB 3.375 g     3.375 g 12.5 mL/hr over 240 Minutes Intravenous Every 8 hours 11/08/17 1821     11/08/17 2200  vancomycin (VANCOCIN) 2,000 mg in sodium chloride 0.9 % 500 mL IVPB     2,000 mg 250 mL/hr over 120 Minutes Intravenous  Once 11/08/17 2152 11/09/17 0020   11/08/17 1830  piperacillin-tazobactam (ZOSYN) IVPB 3.375 g     3.375 g 100 mL/hr over 30 Minutes Intravenous  Once 11/08/17 1821 11/08/17 2134   11/08/17 1745  piperacillin-tazobactam (ZOSYN) IVPB 3.375 g  Status:  Discontinued     3.375 g 100 mL/hr over 30 Minutes Intravenous  Once 11/08/17 1733 11/08/17 1734      Assessment/Plan:   Sigmoid colon thickening and inflammation.It is presumed that this is acute diverticulitis with perforation. 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 -IV antibiotics... Vanc. and Zosyn -Allow full liquid diet -He is not getting any better despite initial improvement.  Colocutaneous fistula remains open following a more than adequate follow percutaneous drainage.  He will need surgery this admission once medical problems are stabilized.  -He reaffirms that he had a normal colonoscopy in [place] one year ago. We will try to get that report.  Tachycardia-new-onset atrial fibrillation and flutter with RVR.Seems much better now with controlled heart rate on oral amiodarone and heparin drip.  Looks like NSR with ectopy.  Loculated left pleural effusion. Left  greater than right.  This will need to be managed prior to surgical resection of his sigmoid colon.  Discussed management with Dr. Ricard Dillon of thoracic surgery and Dr. Barbie Banner of IR.  Plan pigtail catheter drainage this morning.  History peptic ulcer disease by endoscopy- protonix History anemia requiring transfusion    Plan: -Continue heparin drip.  Do not start Eliquis as it looks like he will be needing surgery -Pigtail catheter drainage loculated left pleural effusion today  -Will try to obtain colonoscopy report from [place]   -Tentatively plan exploratory  laparotomy, left colectomy with colostomy during this hospitalization.  The patient is in agreement and understands that he is not improving -He understands that Dr. Donne Hazel will probably be his surgeon if the surgery is done next week.     LOS: 4 days    Adin Hector 11/12/2017

## 2017-11-13 DIAGNOSIS — K56609 Unspecified intestinal obstruction, unspecified as to partial versus complete obstruction: Secondary | ICD-10-CM

## 2017-11-13 LAB — GRAM STAIN

## 2017-11-13 LAB — BASIC METABOLIC PANEL
ANION GAP: 5 (ref 5–15)
BUN: 14 mg/dL (ref 6–20)
CHLORIDE: 107 mmol/L (ref 101–111)
CO2: 24 mmol/L (ref 22–32)
Calcium: 7.4 mg/dL — ABNORMAL LOW (ref 8.9–10.3)
Creatinine, Ser: 1.38 mg/dL — ABNORMAL HIGH (ref 0.61–1.24)
GFR calc Af Amer: 54 mL/min — ABNORMAL LOW (ref >60)
GFR, EST NON AFRICAN AMERICAN: 47 mL/min — AB (ref >60)
GLUCOSE: 104 mg/dL — AB (ref 65–99)
POTASSIUM: 3.6 mmol/L (ref 3.5–5.1)
Sodium: 136 mmol/L (ref 135–145)

## 2017-11-13 LAB — CBC
HEMATOCRIT: 24.9 % — AB (ref 39.0–52.0)
HEMOGLOBIN: 8.2 g/dL — AB (ref 13.0–17.0)
MCH: 24.2 pg — AB (ref 26.0–34.0)
MCHC: 32.9 g/dL (ref 30.0–36.0)
MCV: 73.5 fL — AB (ref 78.0–100.0)
PLATELETS: 460 10*3/uL — AB (ref 150–400)
RBC: 3.39 MIL/uL — AB (ref 4.22–5.81)
RDW: 15.9 % — ABNORMAL HIGH (ref 11.5–15.5)
WBC: 9.1 10*3/uL (ref 4.0–10.5)

## 2017-11-13 LAB — HEPARIN LEVEL (UNFRACTIONATED): Heparin Unfractionated: 0.44 IU/mL (ref 0.30–0.70)

## 2017-11-13 MED ORDER — SODIUM CHLORIDE 0.9 % IV SOLN
INTRAVENOUS | Status: AC
  Administered 2017-11-13 – 2017-11-14 (×2): via INTRAVENOUS

## 2017-11-13 MED ORDER — ACETAMINOPHEN 325 MG PO TABS
650.0000 mg | ORAL_TABLET | Freq: Four times a day (QID) | ORAL | Status: DC
  Administered 2017-11-13 – 2017-11-15 (×7): 650 mg via ORAL
  Filled 2017-11-13 (×6): qty 2

## 2017-11-13 MED ORDER — VANCOMYCIN HCL IN DEXTROSE 750-5 MG/150ML-% IV SOLN
750.0000 mg | Freq: Two times a day (BID) | INTRAVENOUS | Status: DC
  Administered 2017-11-13 – 2017-11-14 (×2): 750 mg via INTRAVENOUS
  Filled 2017-11-13 (×3): qty 150

## 2017-11-13 MED ORDER — KETOROLAC TROMETHAMINE 30 MG/ML IJ SOLN
30.0000 mg | Freq: Three times a day (TID) | INTRAMUSCULAR | Status: DC | PRN
  Administered 2017-11-13: 30 mg via INTRAVENOUS
  Filled 2017-11-13: qty 1

## 2017-11-13 NOTE — Progress Notes (Signed)
Subjective: Stable and alert. Tolerating liquid diet.  Had a bowel movement.  Minimal pain. Draining stool around percutaneous drain and some stool through the drain. Remains on IV Zosyn and vancomycin.  On heparin drip and oral amiodarone, and Lopressor. Cardiology has signed off stating there are no active cardiology issues HR 83 -  NSR with PVC's  Underwent percutaneous placement of pigtail catheter into loculated left pleural effusion yesterday.  Drain is hooked up to a Pleur-evac. Output not recorded but looks like about 40 mL of yellowish fluid. Gram stain - NOS    Objective: Vital signs in last 24 hours: Temp:  [97.6 F (36.4 C)-98.7 F (37.1 C)] 97.6 F (36.4 C) (11/24 0724) Pulse Rate:  [33-98] 83 (11/24 0725) Resp:  [7-24] 20 (11/24 0725) BP: (103-144)/(50-77) 125/70 (11/24 0725) SpO2:  [81 %-100 %] 100 % (11/24 0725) Last BM Date: 11/12/17  Intake/Output from previous day: 11/23 0701 - 11/24 0700 In: 1418.2 [P.O.:360; I.V.:458.2; IV Piggyback:600] Out: -  Intake/Output this shift: No intake/output data recorded.    PE: Gen: Alert, NAD, pleasant Card:HR 55, pedal pulses 2+ BL Pulm: Normal effort, bibasilar rales-faint. Pigtail catheter left pleural chest tubein place with yellowish drainage. Abd: Soft, non-tender, non-distended, bowel sounds present, no HSM,LLQ drain with feculent drainage and small amount drainage around tube and now some stool in the tube and bag.Skin: warm and dry, no rashes  Psych: A&Ox3     Lab Results:  Recent Labs    11/12/17 0356 11/13/17 0317  WBC 11.1* 9.1  HGB 8.0* 8.2*  HCT 24.8* 24.9*  PLT 525* 460*   BMET Recent Labs    11/12/17 0356 11/13/17 0317  NA 136 136  K 4.6 3.6  CL 106 107  CO2 26 24  GLUCOSE 118* 104*  BUN 13 14  CREATININE 1.04 1.38*  CALCIUM 7.6* 7.4*   PT/INR No results for input(s): LABPROT, INR in the last 72 hours. ABG No results for input(s): PHART, HCO3 in the last 72  hours.  Invalid input(s): PCO2, PO2  Studies/Results: Ct Perc Pleural Drain W/indwell Cath W/img Guide  Result Date: 11/12/2017 INDICATION: 82 year old with a loculated left pleural effusion. Plan for CT-guided chest tube placement. EXAM: CT-GUIDED PLACEMENT OF LEFT CHEST TUBE MEDICATIONS: None ANESTHESIA/SEDATION: Fentanyl 50 mcg IV; Versed 1.0 mg IV Moderate Sedation Time:  24 minutes The patient was continuously monitored during the procedure by the interventional radiology nurse under my direct supervision. COMPLICATIONS: None immediate. PROCEDURE: Informed written consent was obtained from the patient after a thorough discussion of the procedural risks, benefits and alternatives. All questions were addressed. A timeout was performed prior to the initiation of the procedure. Patient was placed prone. CT images through the chest were obtained. Posterior loculated left pleural effusion was targeted. The left side of the back was prepped and draped in sterile fashion. Skin and soft tissues were anesthetized with 1% lidocaine. 18 gauge trocar needle was directed into the pleural space with CT guidance. Clear yellow fluid was aspirated. Stiff Amplatz wire was placed. Tract was dilated to accommodate a 12 [place] multipurpose drain. Drain was advanced over the wire. Clear yellow fluid was aspirated and sent for culture. Catheter was sutured to the skin and attached to a pleural fluid evacuation device. FINDINGS: Loculated effusion along the posterior left lower chest. IMPRESSION: CT-guided placement of a chest tube within the loculated left pleural effusion. Electronically Signed   By: Markus Daft M.D.   On: 11/12/2017 15:10    Anti-infectives:  Anti-infectives (From admission, onward)   Start     Dose/Rate Route Frequency Ordered Stop   11/11/17 1600  vancomycin (VANCOCIN) IVPB 1000 mg/200 mL premix     1,000 mg 200 mL/hr over 60 Minutes Intravenous Every 12 hours 11/11/17 0443     11/09/17 1600   vancomycin (VANCOCIN) IVPB 750 mg/150 ml premix  Status:  Discontinued     750 mg 150 mL/hr over 60 Minutes Intravenous Every 12 hours 11/09/17 1500 11/11/17 0443   11/09/17 0800  vancomycin (VANCOCIN) IVPB 750 mg/150 ml premix  Status:  Discontinued     750 mg 150 mL/hr over 60 Minutes Intravenous Every 12 hours 11/08/17 2152 11/09/17 1500   11/09/17 0200  piperacillin-tazobactam (ZOSYN) IVPB 3.375 g     3.375 g 12.5 mL/hr over 240 Minutes Intravenous Every 8 hours 11/08/17 1821     11/08/17 2200  vancomycin (VANCOCIN) 2,000 mg in sodium chloride 0.9 % 500 mL IVPB     2,000 mg 250 mL/hr over 120 Minutes Intravenous  Once 11/08/17 2152 11/09/17 0020   11/08/17 1830  piperacillin-tazobactam (ZOSYN) IVPB 3.375 g     3.375 g 100 mL/hr over 30 Minutes Intravenous  Once 11/08/17 1821 11/08/17 2134   11/08/17 1745  piperacillin-tazobactam (ZOSYN) IVPB 3.375 g  Status:  Discontinued     3.375 g 100 mL/hr over 30 Minutes Intravenous  Once 11/08/17 1733 11/08/17 1734      Assessment/Plan:   Sigmoid colon thickening and inflammation.It is presumed that this is acute diverticulitis with perforation. 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 -IV antibiotics... Vanc. and Zosyn -Allow full liquid diet -He is not getting any better despite initial improvement. Colocutaneous fistula remains open following a more than adequate follow percutaneous drainage. He will need surgery this admission once medical problems are stabilized.  -He reaffirms that he had a normal colonoscopy in [place] one year ago. We will try to get that report.  Tachycardia-new-onset atrial fibrillation and flutter with RVR.Seems much better now with controlled heart rate on oral amiodarone/Lopressor and heparin drip.  Looks like NSR with ectopy.  Loculated left  pleural effusion. Left greater than right. pigtail catheter placed successfully by IR yesterday.  History peptic ulcer disease by endoscopy- protonix History anemia requiring transfusion    Plan: -Continue heparin drip. Do not start Eliquis as it looks like he will be needing surgery -Pigtail catheter drainage loculated left pleural effusion in place. -Cardiac problems now satisfactorily stabilized.  -Will try to obtain colonoscopy report from [place]  -Tentatively plan exploratorylaparotomy, left colectomy with colostomy during this hospitalization. The patient is in agreement and understands that he is not improving -He understands that Dr. Donne Hazel will probably be his surgeon if the surgery is done next week.     LOS: 5 days    Adin Hector 11/13/2017

## 2017-11-13 NOTE — Progress Notes (Signed)
Subjective:  Denies any chest pain or shortness of breath. Remains in sinus rhythm with occasional PVCs. Tolerated CT-guided placement of drain and loculated left pleural effusion yesterday.  Objective:  Vital Signs in the last 24 hours: Temp:  [97.6 F (36.4 C)-98.7 F (37.1 C)] 97.6 F (36.4 C) (11/24 0724) Pulse Rate:  [33-98] 83 (11/24 0725) Resp:  [7-24] 20 (11/24 0725) BP: (103-144)/(50-77) 125/70 (11/24 0725) SpO2:  [81 %-100 %] 100 % (11/24 0725)  Intake/Output from previous day: 11/23 0701 - 11/24 0700 In: 1418.2 [P.O.:360; I.V.:458.2; IV Piggyback:600] Out: -  Intake/Output from this shift: No intake/output data recorded.  Physical Exam: Exam unchanged  Lab Results: Recent Labs    11/12/17 0356 11/13/17 0317  WBC 11.1* 9.1  HGB 8.0* 8.2*  PLT 525* 460*   Recent Labs    11/12/17 0356 11/13/17 0317  NA 136 136  K 4.6 3.6  CL 106 107  CO2 26 24  GLUCOSE 118* 104*  BUN 13 14  CREATININE 1.04 1.38*   No results for input(s): TROPONINI in the last 72 hours.  Invalid input(s): CK, MB Hepatic Function Panel No results for input(s): PROT, ALBUMIN, AST, ALT, ALKPHOS, BILITOT, BILIDIR, IBILI in the last 72 hours. No results for input(s): CHOL in the last 72 hours. No results for input(s): PROTIME in the last 72 hours.  Imaging: Imaging results have been reviewed and Ct Perc Pleural Drain W/indwell Cath W/img Guide  Result Date: 11/12/2017 INDICATION: 82 year old with a loculated left pleural effusion. Plan for CT-guided chest tube placement. EXAM: CT-GUIDED PLACEMENT OF LEFT CHEST TUBE MEDICATIONS: None ANESTHESIA/SEDATION: Fentanyl 50 mcg IV; Versed 1.0 mg IV Moderate Sedation Time:  24 minutes The patient was continuously monitored during the procedure by the interventional radiology nurse under my direct supervision. COMPLICATIONS: None immediate. PROCEDURE: Informed written consent was obtained from the patient after a thorough discussion of the procedural  risks, benefits and alternatives. All questions were addressed. A timeout was performed prior to the initiation of the procedure. Patient was placed prone. CT images through the chest were obtained. Posterior loculated left pleural effusion was targeted. The left side of the back was prepped and draped in sterile fashion. Skin and soft tissues were anesthetized with 1% lidocaine. 18 gauge trocar needle was directed into the pleural space with CT guidance. Clear yellow fluid was aspirated. Stiff Amplatz wire was placed. Tract was dilated to accommodate a 12 [place] multipurpose drain. Drain was advanced over the wire. Clear yellow fluid was aspirated and sent for culture. Catheter was sutured to the skin and attached to a pleural fluid evacuation device. FINDINGS: Loculated effusion along the posterior left lower chest. IMPRESSION: CT-guided placement of a chest tube within the loculated left pleural effusion. Electronically Signed   By: Markus Daft M.D.   On: 11/12/2017 15:10    Cardiac Studies:  Assessment/Plan:  Status postNew onset A. fib/flutter with RVR chads score of 2 Hypertension Colonic diverticulitiswith perforationandstatus postpartial colon obstruction History of peptic ulcer disease secondary to NSAIDs History of GI bleed in the past History of hematuria in the past Chronic anemia secondary to above Bilateral pleural effusion status post left chest tube placement Plan Continue present management No active cardiac issues at present, will follow when necessary basis.   LOS: 5 days    Charolette Forward 11/13/2017, 8:39 AM

## 2017-11-13 NOTE — Progress Notes (Signed)
Referring Physician(s): Dr Leane Para Dr Ashok Cordia  Supervising Physician: Corrie Mckusick  Patient Status:  [facility] - In-pt  Chief Complaint:   Sigmoid colon thickening and inflammation.It is presumed that this is acute diverticulitis with perforation LLQ drain intact In place---placed 08/2017  Loculated pleural effusion New L chest tube drain placed 11/23  Subjective:  LLQ drain:Draining stool; stays in place for now---no new plan in IR For surgery with Dr Dalbert Batman OP feculent Odorous  Left chest tube drain In place OP 300 cc in Pleurvac Yellow Pt feeling some better   Allergies: Patient has no known allergies.  Medications: Prior to Admission medications   Medication Sig Start Date End Date Taking? Authorizing Provider  acetaminophen (TYLENOL) 500 MG tablet Take 1,000 mg by mouth every 6 (six) hours as needed for mild pain.   Yes [provider]  ferrous sulfate 325 (65 FE) MG tablet Take 1 tablet (325 mg total) by mouth daily with breakfast. 07/27/17  Yes Tawny Asal, MD  Multiple Vitamins-Minerals (ADULT ONE DAILY GUMMIES) CHEW Chew 1 tablet by mouth daily.    Yes [provider]  pantoprazole (PROTONIX) 40 MG tablet TAKE 1 TABLET BY MOUTH TWICE DAILY 09/10/17  Yes Lorella Nimrod, MD  Simethicone 180 MG CAPS Take 1 capsule every 4 (four) hours as needed by mouth (for gas, bloating).   Yes [provider]  Sod Bicarb-Ginger-Fennel-Cham (GRIPE WATER) LIQD Take 30 mLs every 4 (four) hours as needed by mouth (for gas).    Yes [provider]  traMADol (ULTRAM) 50 MG tablet Take 1 tablet (50 mg total) by mouth every 6 (six) hours as needed for moderate pain or severe pain. Patient not taking: Reported on 10/23/2017 09/21/17   Aline August, MD     Vital Signs: BP 105/71 (BP Location: Right Arm)   Pulse 83   Temp 97.7 F (36.5 C) (Oral)   Resp 20   Ht 6\' 6"  (1.981 m)   Wt 192 lb 10.9 oz (87.4 kg)   SpO2 100%   BMI 22.27 kg/m    Physical Exam  Abdominal: Soft.  Musculoskeletal: Normal range of motion.  Neurological: He is alert.  Skin: Skin is warm and dry.  Site of abd drain is clean---feculent drainage at site 100 cc in bag  Site of chest tube clean and dry No bleeding; NT OP yellow/clear 300 cc in pleurvac No air leak  Nursing note and vitals reviewed.   Imaging: Ct Chest W Contrast  Result Date: 11/10/2017 CLINICAL DATA:  Pleural effusions. EXAM: CT CHEST WITH CONTRAST TECHNIQUE: Multidetector CT imaging of the chest was performed during intravenous contrast administration. CONTRAST:  4mL ISOVUE-300 IOPAMIDOL (ISOVUE-300) INJECTION 61% COMPARISON:  Two-view chest x-ray 11/08/2017 FINDINGS: Cardiovascular: The heart is enlarged. Coronary artery calcifications are present. Pulmonary artery is enlarged. The main pulmonary outflow tract measures 3.9 cm. The aorta is normal in size. Mediastinum/Nodes: Hyperdense material is present within the left hilum. This is greater than expected for calcification appears be some sort of embolic material. Lungs/Pleura: A loculated left lower lobe fusion is present. There is a large portion posteriorly inferiorly. A second loculated portion along the left major fissure. Right-sided pleural fluid is less certain to be loculated. There is significant volume loss in the left greater than right lower lobes. The upper lung fields are clear. Upper Abdomen: Unremarkable. 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 Vertebral body heights are otherwise  normal. Rightward curvature of the upper thoracic spine is associated with the butterfly vertebral body. No focal lytic or blastic lesions are present. IMPRESSION: 1. Loculated bilateral pleural effusions, left greater than right. 2. Associated airspace disease likely reflects atelectasis. 3. Additional pleural irregularity on the left may represent infection associated  with the fusion. 4. 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 Dextroconvex curvature is associated. Electronically Signed   By: San Morelle M.D.   On: 11/10/2017 15:19   Ct Perc Pleural Drain W/indwell Cath W/img Guide  Result Date: 11/12/2017 INDICATION: 82 year old with a loculated left pleural effusion. Plan for CT-guided chest tube placement. EXAM: CT-GUIDED PLACEMENT OF LEFT CHEST TUBE MEDICATIONS: None ANESTHESIA/SEDATION: Fentanyl 50 mcg IV; Versed 1.0 mg IV Moderate Sedation Time:  24 minutes The patient was continuously monitored during the procedure by the interventional radiology nurse under my direct supervision. COMPLICATIONS: None immediate. PROCEDURE: Informed written consent was obtained from the patient after a thorough discussion of the procedural risks, benefits and alternatives. All questions were addressed. A timeout was performed prior to the initiation of the procedure. Patient was placed prone. CT images through the chest were obtained. Posterior loculated left pleural effusion was targeted. The left side of the back was prepped and draped in sterile fashion. Skin and soft tissues were anesthetized with 1% lidocaine. 18 gauge trocar needle was directed into the pleural space with CT guidance. Clear yellow fluid was aspirated. Stiff Amplatz wire was placed. Tract was dilated to accommodate a 12 [place] multipurpose drain. Drain was advanced over the wire. Clear yellow fluid was aspirated and sent for culture. Catheter was sutured to the skin and attached to a pleural fluid evacuation device. FINDINGS: Loculated effusion along the posterior left lower chest. IMPRESSION: CT-guided placement of a chest tube within the loculated left pleural effusion. Electronically Signed   By: Markus Daft M.D.   On: 11/12/2017 15:10    Labs:  CBC: Recent Labs    11/10/17 0352 11/11/17 0319 11/12/17 0356 11/13/17 0317  WBC 12.6* 10.9* 11.1* 9.1  HGB 8.1* 7.9* 8.0*  8.2*  HCT 24.7* 24.8* 24.8* 24.9*  PLT 599* 568* 525* 460*    COAGS: Recent Labs    07/23/17 1328 09/16/17 0736 11/08/17 2125  INR 1.09 1.18 1.11  APTT 27  --  33    BMP: Recent Labs    11/10/17 0352 11/11/17 0319 11/12/17 0356 11/13/17 0317  NA 131* 135 136 136  K 3.5 3.4* 4.6 3.6  CL 101 103 106 107  CO2 25 27 26 24   GLUCOSE 108* 102* 118* 104*  BUN 18 16 13 14   CALCIUM 7.4* 7.5* 7.6* 7.4*  CREATININE 1.02 1.04 1.04 1.38*  GFRNONAA >60 >60 >60 47*  GFRAA >60 >60 >60 54*    LIVER FUNCTION TESTS: Recent Labs    09/17/17 0500 10/23/17 1628 11/08/17 1041 11/09/17 0304  BILITOT 0.6 0.4 0.5 0.4  AST 19 23 27 22   ALT 10* 9* 14* 12*  ALKPHOS 61 102 92 73  PROT 5.7* 7.5 7.2 6.2*  ALBUMIN 1.9* 2.5* 1.7* 1.5*    Assessment and Plan:  LLQ diverticular abscess drain in place Surgery soon per [facility] note Left pleural effusion drain in place Working well   Electronically Signed: Monia Sabal A, PA-C 11/13/2017, 1:08 PM   I spent a total of 15 Minutes at the the patient's bedside AND on the patient's hospital floor or unit, greater than 50% of which was counseling/coordinating care for LLQ  abscess and L loculated pleural eff drain

## 2017-11-13 NOTE — Plan of Care (Signed)
Pt cooperative with care and progressing in all areas.

## 2017-11-13 NOTE — Progress Notes (Signed)
Family Medicine Teaching Service Daily Progress Note Intern Pager: [phone]  Patient name: Roberto Owen Medical record number: [id number] Date of birth: 1935/09/01 Age: 82 y.o. Gender: male  Primary Care Provider: Patient, No Pcp Per Consultants: Surgery, IR, Cardiology Code Status: FULL  Pt Overview and Major Events to Date:  11/19 - admitted for GI spasms, LOA 11/22 - allowed to eat liquid diet per Surgery 11/23 - Left chest tube placement by IR  Assessment and Plan: Roberto Owen is a 82 y.o. male presenting with an in place abdominal drain due to previous episode of diverticulitis with abscess of the sigmoid colon and subsequent development of colonic fistula. PMH is significant for Peptic ulcer disease, history of GI bleed and previous episode of diverticulosis.  Sigmoid colon thickening/inflammation with fistulas:  Patient with abdominal pain rovernight and given on 30mg  injection of Toradol that resolved his pain. Appetite is improved as he is now on liquid diet. He continues to have thick feculent drainage from around the drain insertion site. - Surgery following, on full liquid diet s/p chest tube placement on 11/23. Plan for surgery next week by Dr. Donne Hazel. Anticipating exploratory laparotomy, colectomy, and colostomy once pleural effusions addressed. - Vanc/Zosyn dosed per pharmacy (Day 6, started on 11/19) - Gripe water to help with GI spasms; baclofen for hiccups   - IR consulted and recommended no increase in the size of the drain or drain injection and that drain is not controlling his fistula.  - baclofen PRN spasm - tylenol PRN - zofran PRN  Loculated Left Pleural Effusion: Found on CT Abd and suspicous for PNA. CT chest obtained 11/21 and showed bilateral loculated pleural effusions. WBC stable at 11, admitted at 15.3.  - CVTS consulted 11/20, recommended full chest CT and IR consult - IR consulted and chest tube placement on 11/23 - Continue Vanc/Zosyn for  HCAP coverage - Continuous pulse ox - Spiometry q1hr  New Onset Afib: New onset, with RVR in ED s/p Cardizem bolus and drip with no significant rate control, subsequently started on amiodarone per Cardiology. He has no previous cardiac history.  Possibly precipitated by infection. Trop 0.01>0.04>0.03. TSH 0.271, low. Electrolytes are normal. No chest pain. Chads vasc score of 2. Hasbled score of 1. Has a history of peptic ulcer disease. Blood pressure stable. ECHO 11/21 with EF of 45-50% with diffuse hypokinesis and PA pressure moderately increased.  - Continue Heparin; consider transition to eliquis in future per Cards but not now as surgery is anticipated. - Transitioned to PO amiodarone 200 mg BID, per Cards; anticipate reducing to daily in several days - Metoprolol decreased to 12.5 mg BID from 25 mg BID 11/22, per cards for lower heart rates  Hypoalbuminemia due to Severe protein-calorie malnutrition ([place]): - Per Nutrition now that he has full liquid diet start Ensure Enlive PO BID and continue to monitor.  AKI: Worsened overnight, Creatinine 1.33>1.27>1.02 >1.04 > 1.04 >1.31, baseline of  ~0.8. - Continue oral hydration and consider restarting IVF 1/2 maintenance - Continue to follow BMP for resolution of AKI  Hyponatremia: Resolved.  Likely due to dehydration. - Continue to monitor BMP am  Thrombocytosis: Platelets in 500s.  It is not chronic.  Likely due to acute infection. - Monitor   Anemia: Hemoglobin stable at 8.0, BL ~ 9.0.  No signs of overt bleeding. - Monitor with labs  FEN/GI: Protonix, NPO for chest tube placement 11/23 then order full liquid diet Prophylaxis: Heparin  Disposition: continue inpatient management of GI spasms  Subjective:  Patient feels better this morning. He had an episode of lower mid abdominal pain rated at a 9/10 that last for several minutes and did not respond to Tylenol. It was non-radiating and felt like a "dull deep pain."He responded well  to Toradol 30mg  injection and has not had any pain since. No other issues or complaints this morning.  Objective: Temp:  [97.8 F (36.6 C)-98.7 F (37.1 C)] 98.4 F (36.9 C) (11/24 0400) Pulse Rate:  [33-98] 53 (11/24 0600) Resp:  [7-24] 16 (11/24 0600) BP: (103-144)/(50-77) 112/65 (11/24 0600) SpO2:  [81 %-100 %] 100 % (11/24 0600)  Physical Exam: Gen: Alert and Oriented x 3, NAD CV: sinus rhythm with PVCs, normal rate, no murmurs Resp: decreased breath sounds bilaterally, especially over left lower lung field, no increased WOB Abd: non-distended, non-tender, soft, +bs in all four quadrants; LLQ drain in place - covered with dressing MSK: FROM in all four extremities Ext: no clubbing, cyanosis, or edema Skin: warm, dry, intact, no rashes  Laboratory: Recent Labs  Lab 11/11/17 0319 11/12/17 0356 11/13/17 0317  WBC 10.9* 11.1* 9.1  HGB 7.9* 8.0* 8.2*  HCT 24.8* 24.8* 24.9*  PLT 568* 525* 460*   Recent Labs  Lab 11/08/17 1041 11/09/17 0304  11/11/17 0319 11/12/17 0356 11/13/17 0317  NA 129* 131*   < > 135 136 136  K 4.2 3.9   < > 3.4* 4.6 3.6  CL 96* 101   < > 103 106 107  CO2 24 21*   < > 27 26 24   BUN 39* 28*   < > 16 13 14   CREATININE 1.33* 1.27*   < > 1.04 1.04 1.38*  CALCIUM 8.0* 7.5*   < > 7.5* 7.6* 7.4*  PROT 7.2 6.2*  --   --   --   --   BILITOT 0.5 0.4  --   --   --   --   ALKPHOS 92 73  --   --   --   --   ALT 14* 12*  --   --   --   --   AST 27 22  --   --   --   --   GLUCOSE 108* 106*   < > 102* 118* 104*   < > = values in this interval not displayed.   11/19 - Lipase: 19 11/19 - Lactic Acid: 1.52, 1.15 11/20 - Troponin I: 0.03,  11/20 - TSH: 0.271 (low) 11/20 - T3: 0.9; T40.98  Imaging/Diagnostic Tests:  Dg Chest 2 View  Result Date: 11/08/2017 IMPRESSION: Mild left basilar subsegmental atelectasis or infiltrate is noted. Followup PA and lateral chest X-ray is recommended in 3-4 weeks following trial of antibiotic therapy to ensure  resolution and exclude underlying malignancy. Electronically Signed   By: Marijo Conception, M.D.   On: 11/08/2017 16:46   Ct Chest W Contrast  Result Date: 11/10/2017 IMPRESSION: 1. Loculated bilateral pleural effusions, left greater than right. 2. Associated airspace disease likely reflects atelectasis. 3. Additional pleural irregularity on the left may represent infection associated with the fusion. 4. 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 Dextroconvex curvature is associated. Electronically Signed   By: San Morelle M.D.   On: 11/10/2017 15:19   Ct Abdomen Pelvis W Contrast  Result Date: 11/08/2017 IMPRESSION: 1. The position of the left abdominal percutaneous pigtail catheter appears stable since October. No residual abscess but continued and perhaps progressive circumferential colonic wall thickening and  mesenteric inflammation near the catheter. 2. Increased low-density stool distending the upstream left colon and splenic flexure raising the possibility of partial obstruction. 3. Consider Colonoscopy: The persistent colonic wall thickening in #1 may be inflammatory but colonic tumor is difficult to exclude. 4. New since October small to moderate size loculated left pleural effusion. Associated lung base atelectasis without definite pneumonia. Electronically Signed   By: Genevie Ann M.D.   On: 11/08/2017 15:30   CT Perc Pleural Drain Placement: 11/12/2017 CT-guided placement of a chest tube within the loculated left pleural effusion.  Nuala Alpha, DO 11/13/2017, 8:40 AM PGY-1, [place] Intern pager: [phone], text pages welcome

## 2017-11-13 NOTE — Progress Notes (Signed)
ANTICOAGULATION CONSULT NOTE - Follow Up Consult  Pharmacy Consult for Heparin Indication: atrial fibrillation  No Known Allergies  Patient Measurements: Height: 6\' 6"  (198.1 cm) Weight: 192 lb 10.9 oz (87.4 kg) IBW/kg (Calculated) : 91.4  Vital Signs: Temp: 98.4 F (36.9 C) (11/24 0400) Temp Source: Oral (11/24 0400) BP: 112/65 (11/24 0600) Pulse Rate: 53 (11/24 0600)  Labs: Recent Labs    11/11/17 0319 11/12/17 0356 11/13/17 0317  HGB 7.9* 8.0* 8.2*  HCT 24.8* 24.8* 24.9*  PLT 568* 525* 460*  HEPARINUNFRC 0.45 0.31 0.44  CREATININE 1.04 1.04 1.38*    Estimated Creatinine Clearance: 52.8 mL/min (A) (by C-G formula based on SCr of 1.38 mg/dL (H)).   Medications:  Infusions:  . heparin 1,600 Units/hr (11/13/17 0109)  . piperacillin-tazobactam (ZOSYN)  IV 3.375 g (11/13/17 0545)  . vancomycin Stopped (11/13/17 0543)    Assessment: 82 year old male admitted with diverticular abscess and colonic fistula. He has new atrial fibrillation and is on anticoagulation with IV heparin. Note he has a history of GI bleed due to NSAIDs in August 2018.   His heparin level was therapeutic (0.44) today on 1600 units/hr after restart, which was a previous dose he was therapeutic on. No signs of bleeding noted and CBC low but stable.  Goal of Therapy:  Heparin level 0.3-0.7 units/ml Monitor platelets by anticoagulation protocol: Yes   Plan:  Continue heparin gtt @ 1600 units/hr Daily heparin level and CBC Monitor for bleeding complications  Bertis Ruddy, PharmD Pharmacy Resident Pager #: [phone] 11/13/2017 7:05 AM

## 2017-11-13 NOTE — Progress Notes (Signed)
Pharmacy Antibiotic Note  Roberto Owen is a 82 y.o. male admitted on 11/08/2017 with intra-abdominal infection.  Pharmacy has been consulted for Vancomycin dosing. WBC decreasing.  Afebrile  Day # 6 of Vancomycin / Zosyn Scr = 1.38 this AM, increased  Plan: Zosyn 3.375 grams iv Q 8 hours - 4 hr infusion Decrease Vancomycin to 750 mg iv Q 12 hours -- Does vancomycin need to continue? Continue to follow  Height: 6\' 6"  (198.1 cm) Weight: 192 lb 10.9 oz (87.4 kg) IBW/kg (Calculated) : 91.4  Temp (24hrs), Avg:98 F (36.7 C), Min:97.6 F (36.4 C), Max:98.7 F (37.1 C)  Recent Labs  Lab 11/08/17 1116 11/08/17 2139 11/09/17 0304 11/10/17 0352 11/11/17 0319 11/12/17 0356 11/13/17 0317  WBC  --   --  15.8* 12.6* 10.9* 11.1* 9.1  CREATININE  --   --  1.27* 1.02 1.04 1.04 1.38*  LATICACIDVEN 1.52 1.15  --   --   --   --   --   VANCOTROUGH  --   --   --   --  13*  --   --     Estimated Creatinine Clearance: 52.8 mL/min (A) (by C-G formula based on SCr of 1.38 mg/dL (H)).    No Known Allergies  Antimicrobials this admission: Vancomycin 11/19>> Zosyn 11/19>>   Tad Moore 11/13/2017 10:54 AM

## 2017-11-14 ENCOUNTER — Inpatient Hospital Stay (HOSPITAL_COMMUNITY): Payer: Medicare Other | Admitting: Anesthesiology

## 2017-11-14 ENCOUNTER — Inpatient Hospital Stay (HOSPITAL_COMMUNITY): Payer: Medicare Other

## 2017-11-14 ENCOUNTER — Encounter (HOSPITAL_COMMUNITY)
Admission: EM | Disposition: A | Discharge status: Skilled Nursing Facility | Payer: Self-pay | Source: Home / Self Care | Attending: Family Medicine

## 2017-11-14 HISTORY — PX: LAPAROTOMY: SHX154

## 2017-11-14 LAB — BASIC METABOLIC PANEL
Anion gap: 9 (ref 5–15)
BUN: 17 mg/dL (ref 6–20)
CO2: 18 mmol/L — ABNORMAL LOW (ref 22–32)
CREATININE: 1.91 mg/dL — AB (ref 0.61–1.24)
Calcium: 7.3 mg/dL — ABNORMAL LOW (ref 8.9–10.3)
Chloride: 108 mmol/L (ref 101–111)
GFR calc Af Amer: 37 mL/min — ABNORMAL LOW (ref >60)
GFR, EST NON AFRICAN AMERICAN: 32 mL/min — AB (ref >60)
GLUCOSE: 88 mg/dL (ref 65–99)
POTASSIUM: 3.8 mmol/L (ref 3.5–5.1)
Sodium: 135 mmol/L (ref 135–145)

## 2017-11-14 LAB — POCT I-STAT 7, (LYTES, BLD GAS, ICA,H+H)
ACID-BASE DEFICIT: 4 mmol/L — AB (ref 0.0–2.0)
Acid-base deficit: 6 mmol/L — ABNORMAL HIGH (ref 0.0–2.0)
BICARBONATE: 20.3 mmol/L (ref 20.0–28.0)
Bicarbonate: 21.3 mmol/L (ref 20.0–28.0)
CALCIUM ION: 1.01 mmol/L — AB (ref 1.15–1.40)
CALCIUM ION: 0.99 mmol/L — AB (ref 1.15–1.40)
HCT: 27 % — ABNORMAL LOW (ref 39.0–52.0)
HEMATOCRIT: 22 % — AB (ref 39.0–52.0)
HEMOGLOBIN: 9.2 g/dL — AB (ref 13.0–17.0)
Hemoglobin: 7.5 g/dL — ABNORMAL LOW (ref 13.0–17.0)
O2 Saturation: 100 %
O2 Saturation: 100 %
POTASSIUM: 3.5 mmol/L (ref 3.5–5.1)
POTASSIUM: 3.8 mmol/L (ref 3.5–5.1)
SODIUM: 140 mmol/L (ref 135–145)
SODIUM: 140 mmol/L (ref 135–145)
TCO2: 22 mmol/L (ref 22–32)
TCO2: 22 mmol/L (ref 22–32)
pCO2 arterial: 39.9 mmHg (ref 32.0–48.0)
pCO2 arterial: 43.6 mmHg (ref 32.0–48.0)
pH, Arterial: 7.335 — ABNORMAL LOW (ref 7.350–7.450)
pH, Arterial: 7.275 — ABNORMAL LOW (ref 7.350–7.450)
pO2, Arterial: 297 mmHg — ABNORMAL HIGH (ref 83.0–108.0)
pO2, Arterial: 249 mmHg — ABNORMAL HIGH (ref 83.0–108.0)

## 2017-11-14 LAB — CBC
HCT: 27.8 % — ABNORMAL LOW (ref 39.0–52.0)
HCT: 24.3 % — ABNORMAL LOW (ref 39.0–52.0)
HEMOGLOBIN: 7.8 g/dL — AB (ref 13.0–17.0)
Hemoglobin: 8.8 g/dL — ABNORMAL LOW (ref 13.0–17.0)
MCH: 23.6 pg — AB (ref 26.0–34.0)
MCH: 24 pg — AB (ref 26.0–34.0)
MCHC: 31.7 g/dL (ref 30.0–36.0)
MCHC: 32.1 g/dL (ref 30.0–36.0)
MCV: 74.5 fL — AB (ref 78.0–100.0)
MCV: 74.8 fL — AB (ref 78.0–100.0)
PLATELETS: 479 10*3/uL — AB (ref 150–400)
Platelets: 482 10*3/uL — ABNORMAL HIGH (ref 150–400)
RBC: 3.73 MIL/uL — AB (ref 4.22–5.81)
RBC: 3.25 MIL/uL — AB (ref 4.22–5.81)
RDW: 16.4 % — AB (ref 11.5–15.5)
RDW: 16.4 % — ABNORMAL HIGH (ref 11.5–15.5)
WBC: 5.3 10*3/uL (ref 4.0–10.5)
WBC: 32.1 10*3/uL — ABNORMAL HIGH (ref 4.0–10.5)

## 2017-11-14 LAB — GLUCOSE, CAPILLARY
GLUCOSE-CAPILLARY: 65 mg/dL (ref 65–99)
GLUCOSE-CAPILLARY: 62 mg/dL — AB (ref 65–99)
GLUCOSE-CAPILLARY: 68 mg/dL (ref 65–99)
Glucose-Capillary: 100 mg/dL — ABNORMAL HIGH (ref 65–99)

## 2017-11-14 LAB — POCT I-STAT 4, (NA,K, GLUC, HGB,HCT)
GLUCOSE: 112 mg/dL — AB (ref 65–99)
HCT: 25 % — ABNORMAL LOW (ref 39.0–52.0)
Hemoglobin: 8.5 g/dL — ABNORMAL LOW (ref 13.0–17.0)
Potassium: 3.8 mmol/L (ref 3.5–5.1)
Sodium: 140 mmol/L (ref 135–145)

## 2017-11-14 LAB — COMPREHENSIVE METABOLIC PANEL
ALK PHOS: 61 U/L (ref 38–126)
ALT: 11 U/L — AB (ref 17–63)
ANION GAP: 7 (ref 5–15)
AST: 21 U/L (ref 15–41)
Albumin: 1.2 g/dL — ABNORMAL LOW (ref 3.5–5.0)
BILIRUBIN TOTAL: 0.7 mg/dL (ref 0.3–1.2)
BUN: 18 mg/dL (ref 6–20)
CALCIUM: 7.1 mg/dL — AB (ref 8.9–10.3)
CO2: 22 mmol/L (ref 22–32)
CREATININE: 2.26 mg/dL — AB (ref 0.61–1.24)
Chloride: 107 mmol/L (ref 101–111)
GFR, EST AFRICAN AMERICAN: 30 mL/min — AB (ref >60)
GFR, EST NON AFRICAN AMERICAN: 26 mL/min — AB (ref >60)
Glucose, Bld: 86 mg/dL (ref 65–99)
Potassium: 2.9 mmol/L — ABNORMAL LOW (ref 3.5–5.1)
Sodium: 136 mmol/L (ref 135–145)
TOTAL PROTEIN: 5.7 g/dL — AB (ref 6.5–8.1)

## 2017-11-14 LAB — PREPARE RBC (CROSSMATCH)

## 2017-11-14 LAB — BLOOD GAS, ARTERIAL
Acid-base deficit: 2.4 mmol/L — ABNORMAL HIGH (ref 0.0–2.0)
BICARBONATE: 21 mmol/L (ref 20.0–28.0)
DRAWN BY: 280981
FIO2: 21
O2 Saturation: 95 %
PATIENT TEMPERATURE: 98.6
PCO2 ART: 31 mmHg — AB (ref 32.0–48.0)
PH ART: 7.446 (ref 7.350–7.450)
PO2 ART: 74.4 mmHg — AB (ref 83.0–108.0)

## 2017-11-14 LAB — HEPARIN LEVEL (UNFRACTIONATED): Heparin Unfractionated: 0.58 IU/mL (ref 0.30–0.70)

## 2017-11-14 LAB — AMMONIA: AMMONIA: 19 umol/L (ref 9–35)

## 2017-11-14 LAB — TROPONIN I

## 2017-11-14 LAB — LACTIC ACID, PLASMA
Lactic Acid, Venous: 1.8 mmol/L (ref 0.5–1.9)
Lactic Acid, Venous: 1.9 mmol/L (ref 0.5–1.9)

## 2017-11-14 SURGERY — LAPAROTOMY, EXPLORATORY
Anesthesia: General | Site: Abdomen

## 2017-11-14 MED ORDER — SODIUM CHLORIDE 0.9 % IV BOLUS (SEPSIS)
1000.0000 mL | Freq: Once | INTRAVENOUS | Status: AC
  Administered 2017-11-14: 1000 mL via INTRAVENOUS

## 2017-11-14 MED ORDER — SODIUM CHLORIDE 0.9 % IV SOLN: Freq: Once | INTRAVENOUS | Status: DC

## 2017-11-14 MED ORDER — IOPAMIDOL (ISOVUE-300) INJECTION 61%
15.0000 mL | INTRAVENOUS | Status: AC
  Administered 2017-11-14: 15 mL via ORAL

## 2017-11-14 MED ORDER — VANCOMYCIN HCL IN DEXTROSE 750-5 MG/150ML-% IV SOLN: 750.0000 mg | Freq: Two times a day (BID) | INTRAVENOUS | Status: DC

## 2017-11-14 MED ORDER — PROPOFOL 1000 MG/100ML IV EMUL
0.0000 ug/kg/min | INTRAVENOUS | Status: DC
  Administered 2017-11-15: 30 ug/kg/min via INTRAVENOUS
  Administered 2017-11-15: 35 ug/kg/min via INTRAVENOUS
  Administered 2017-11-15 – 2017-11-16 (×2): 15 ug/kg/min via INTRAVENOUS
  Filled 2017-11-14 (×5): qty 100

## 2017-11-14 MED ORDER — EPHEDRINE SULFATE 50 MG/ML IJ SOLN
INTRAMUSCULAR | Status: DC | PRN
  Administered 2017-11-14 (×2): 10 mg via INTRAVENOUS

## 2017-11-14 MED ORDER — POTASSIUM CHLORIDE 10 MEQ/100ML IV SOLN
10.0000 meq | INTRAVENOUS | Status: AC
  Administered 2017-11-14 (×2): 10 meq via INTRAVENOUS
  Filled 2017-11-14 (×2): qty 100

## 2017-11-14 MED ORDER — FENTANYL CITRATE (PF) 100 MCG/2ML IJ SOLN
50.0000 ug | INTRAMUSCULAR | Status: DC | PRN
  Administered 2017-11-15: 50 ug via INTRAVENOUS
  Filled 2017-11-14: qty 2

## 2017-11-14 MED ORDER — FENTANYL CITRATE (PF) 100 MCG/2ML IJ SOLN
50.0000 ug | INTRAMUSCULAR | Status: DC | PRN
  Administered 2017-11-16: 50 ug via INTRAVENOUS
  Administered 2017-11-17 (×2): 25 ug via INTRAVENOUS
  Filled 2017-11-14 (×2): qty 2

## 2017-11-14 MED ORDER — HEPARIN (PORCINE) IN NACL 100-0.45 UNIT/ML-% IJ SOLN: 1600.0000 [IU]/h | INTRAMUSCULAR | Status: DC

## 2017-11-14 MED ORDER — DEXTROSE 50 % IV SOLN
50.0000 mL | Freq: Once | INTRAVENOUS | Status: AC
  Administered 2017-11-14: 50 mL via INTRAVENOUS
  Filled 2017-11-14: qty 50

## 2017-11-14 MED ORDER — ALBUMIN HUMAN 5 % IV SOLN
INTRAVENOUS | Status: DC | PRN
  Administered 2017-11-14 (×2): via INTRAVENOUS

## 2017-11-14 MED ORDER — SODIUM CHLORIDE 0.9 % IV SOLN
INTRAVENOUS | Status: DC | PRN
  Administered 2017-11-14: 18:00:00 via INTRAVENOUS

## 2017-11-14 MED ORDER — LIDOCAINE HCL (CARDIAC) 20 MG/ML IV SOLN
INTRAVENOUS | Status: DC | PRN
  Administered 2017-11-14: 60 mg via INTRAVENOUS

## 2017-11-14 MED ORDER — ONDANSETRON HCL 4 MG/2ML IJ SOLN
INTRAMUSCULAR | Status: DC | PRN
  Administered 2017-11-14: 4 mg via INTRAVENOUS

## 2017-11-14 MED ORDER — IOPAMIDOL (ISOVUE-300) INJECTION 61%
INTRAVENOUS | Status: AC
  Filled 2017-11-14: qty 75

## 2017-11-14 MED ORDER — SUCCINYLCHOLINE CHLORIDE 20 MG/ML IJ SOLN
INTRAMUSCULAR | Status: DC | PRN
  Administered 2017-11-14: 100 mg via INTRAVENOUS

## 2017-11-14 MED ORDER — PROPOFOL 500 MG/50ML IV EMUL
INTRAVENOUS | Status: DC | PRN
  Administered 2017-11-14 (×2): 50 ug/kg/min via INTRAVENOUS

## 2017-11-14 MED ORDER — PHENYLEPHRINE HCL 10 MG/ML IJ SOLN
INTRAVENOUS | Status: DC | PRN
  Administered 2017-11-14: 50 ug/min via INTRAVENOUS

## 2017-11-14 MED ORDER — FENTANYL CITRATE (PF) 100 MCG/2ML IJ SOLN
INTRAMUSCULAR | Status: DC | PRN
  Administered 2017-11-14 (×2): 50 ug via INTRAVENOUS
  Administered 2017-11-14: 100 ug via INTRAVENOUS
  Administered 2017-11-14 (×2): 50 ug via INTRAVENOUS

## 2017-11-14 MED ORDER — 0.9 % SODIUM CHLORIDE (POUR BTL) OPTIME
TOPICAL | Status: DC | PRN
  Administered 2017-11-14 (×6): 1000 mL

## 2017-11-14 MED ORDER — PROPOFOL 10 MG/ML IV BOLUS
INTRAVENOUS | Status: AC
  Filled 2017-11-14: qty 20

## 2017-11-14 MED ORDER — DEXTROSE-NACL 5-0.9 % IV SOLN
INTRAVENOUS | Status: DC
  Administered 2017-11-14: 75 mL/h via INTRAVENOUS
  Administered 2017-11-15: 13:00:00 via INTRAVENOUS
  Administered 2017-11-16: 75 mL/h via INTRAVENOUS
  Administered 2017-11-16: 1000 mL via INTRAVENOUS
  Administered 2017-11-16 – 2017-11-17 (×3): via INTRAVENOUS
  Administered 2017-11-17: 1000 mL via INTRAVENOUS
  Administered 2017-11-18 – 2017-11-19 (×2): via INTRAVENOUS

## 2017-11-14 MED ORDER — SODIUM CHLORIDE 0.9 % IV SOLN
Freq: Once | INTRAVENOUS | Status: AC
  Administered 2017-11-14: 19:00:00 via INTRAVENOUS

## 2017-11-14 MED ORDER — DEXTROSE 50 % IV SOLN
INTRAVENOUS | Status: AC
  Administered 2017-11-14: 14:00:00
  Filled 2017-11-14: qty 50

## 2017-11-14 MED ORDER — FENTANYL CITRATE (PF) 250 MCG/5ML IJ SOLN
INTRAMUSCULAR | Status: AC
  Filled 2017-11-14: qty 5

## 2017-11-14 MED ORDER — DEXTROSE-NACL 5-0.9 % IV SOLN: INTRAVENOUS | Status: DC

## 2017-11-14 MED ORDER — SODIUM CHLORIDE 0.9 % IV BOLUS (SEPSIS): 1000.0000 mL | Freq: Once | INTRAVENOUS | Status: DC

## 2017-11-14 MED ORDER — PHENYLEPHRINE HCL 10 MG/ML IJ SOLN
INTRAMUSCULAR | Status: DC | PRN
  Administered 2017-11-14 (×2): 80 ug via INTRAVENOUS

## 2017-11-14 MED ORDER — DEXAMETHASONE SODIUM PHOSPHATE 10 MG/ML IJ SOLN
INTRAMUSCULAR | Status: DC | PRN
  Administered 2017-11-14: 10 mg via INTRAVENOUS

## 2017-11-14 MED ORDER — PROPOFOL 10 MG/ML IV BOLUS
INTRAVENOUS | Status: DC | PRN
  Administered 2017-11-14: 50 mg via INTRAVENOUS

## 2017-11-14 MED ORDER — ROCURONIUM BROMIDE 100 MG/10ML IV SOLN
INTRAVENOUS | Status: DC | PRN
  Administered 2017-11-14 (×3): 10 mg via INTRAVENOUS
  Administered 2017-11-14: 50 mg via INTRAVENOUS

## 2017-11-14 MED ORDER — DEXTROSE 50 % IV SOLN
INTRAVENOUS | Status: AC
  Filled 2017-11-14: qty 50

## 2017-11-14 SURGICAL SUPPLY — 48 items
BLADE CLIPPER SURG (BLADE) IMPLANT
BNDG GAUZE ELAST 4 BULKY (GAUZE/BANDAGES/DRESSINGS) ×3 IMPLANT
CANISTER SUCT 3000ML PPV (MISCELLANEOUS) ×3 IMPLANT
CHLORAPREP W/TINT 26ML (MISCELLANEOUS) ×3 IMPLANT
COVER SURGICAL LIGHT HANDLE (MISCELLANEOUS) ×3 IMPLANT
DRAPE LAPAROSCOPIC ABDOMINAL (DRAPES) ×3 IMPLANT
DRAPE WARM FLUID 44X44 (DRAPE) ×3 IMPLANT
DRSG OPSITE POSTOP 4X10 (GAUZE/BANDAGES/DRESSINGS) IMPLANT
DRSG OPSITE POSTOP 4X8 (GAUZE/BANDAGES/DRESSINGS) IMPLANT
DRSG PAD ABDOMINAL 8X10 ST (GAUZE/BANDAGES/DRESSINGS) ×3 IMPLANT
ELECT BLADE 4.0 EZ CLEAN MEGAD (MISCELLANEOUS) ×3
ELECT BLADE 6.5 EXT (BLADE) IMPLANT
ELECT CAUTERY BLADE 6.4 (BLADE) ×3 IMPLANT
ELECT REM PT RETURN 9FT ADLT (ELECTROSURGICAL) ×3
ELECTRODE BLDE 4.0 EZ CLN MEGD (MISCELLANEOUS) ×1 IMPLANT
ELECTRODE REM PT RTRN 9FT ADLT (ELECTROSURGICAL) ×1 IMPLANT
GAUZE SPONGE 4X4 12PLY STRL (GAUZE/BANDAGES/DRESSINGS) ×3 IMPLANT
GLOVE BIO SURGEON STRL SZ 6 (GLOVE) ×3 IMPLANT
GLOVE BIOGEL PI IND STRL 6.5 (GLOVE) ×1 IMPLANT
GLOVE BIOGEL PI IND STRL 7.0 (GLOVE) ×1 IMPLANT
GLOVE BIOGEL PI INDICATOR 6.5 (GLOVE) ×2
GLOVE BIOGEL PI INDICATOR 7.0 (GLOVE) ×2
GLOVE SKINSENSE NS SZ7.0 (GLOVE) ×4
GLOVE SKINSENSE STRL SZ7.0 (GLOVE) ×2 IMPLANT
GOWN STRL REUS W/ TWL LRG LVL3 (GOWN DISPOSABLE) ×2 IMPLANT
GOWN STRL REUS W/TWL LRG LVL3 (GOWN DISPOSABLE) ×4
KIT BASIN OR (CUSTOM PROCEDURE TRAY) ×3 IMPLANT
KIT ROOM TURNOVER OR (KITS) ×3 IMPLANT
LIGASURE IMPACT 36 18CM CVD LR (INSTRUMENTS) IMPLANT
NS IRRIG 1000ML POUR BTL (IV SOLUTION) ×6 IMPLANT
PACK GENERAL/GYN (CUSTOM PROCEDURE TRAY) ×3 IMPLANT
PAD ARMBOARD 7.5X6 YLW CONV (MISCELLANEOUS) ×3 IMPLANT
RELOAD PROXIMATE 75MM BLUE (ENDOMECHANICALS) ×3 IMPLANT
SPECIMEN JAR LARGE (MISCELLANEOUS) IMPLANT
SPONGE LAP 18X18 X RAY DECT (DISPOSABLE) ×3 IMPLANT
STAPLER PROXIMATE 75MM BLUE (STAPLE) ×3 IMPLANT
STAPLER VISISTAT 35W (STAPLE) ×3 IMPLANT
SUCTION POOLE TIP (SUCTIONS) ×3 IMPLANT
SUT PDS AB 1 TP1 96 (SUTURE) ×6 IMPLANT
SUT PROLENE 2 0 SH DA (SUTURE) ×3 IMPLANT
SUT SILK 2 0 SH CR/8 (SUTURE) ×3 IMPLANT
SUT SILK 2 0 TIES 10X30 (SUTURE) ×3 IMPLANT
SUT SILK 3 0 SH CR/8 (SUTURE) ×3 IMPLANT
SUT SILK 3 0 TIES 10X30 (SUTURE) ×3 IMPLANT
SUT VIC AB 3-0 SH 18 (SUTURE) ×6 IMPLANT
TAPE PAPER 2X10 WHT MICROPORE (GAUZE/BANDAGES/DRESSINGS) ×3 IMPLANT
TOWEL OR 17X26 10 PK STRL BLUE (TOWEL DISPOSABLE) ×3 IMPLANT
TRAY FOLEY W/METER SILVER 16FR (SET/KITS/TRAYS/PACK) IMPLANT

## 2017-11-14 NOTE — Progress Notes (Signed)
Family Medicine Teaching Service Daily Progress Note Intern Pager: [phone]  Patient name: Roberto Owen Medical record number: [id number] Date of birth: Aug 01, 1935 Age: 82 y.o. Gender: male  Primary Care Provider: Patient, No Pcp Per Consultants: Surgery, IR, Cardiology Code Status: FULL  Pt Overview and Major Events to Date:  11/19 - admitted for GI spasms, LOA 11/22 - allowed to eat liquid diet per Surgery 11/23 - Left chest tube placement by IR  Assessment and Plan: Salathiel Ferrara is a 82 y.o. male presenting with an in place abdominal drain due to previous episode of diverticulitis with abscess of the sigmoid colon and subsequent development of colonic fistula. PMH is significant for Peptic ulcer disease, history of GI bleed and previous episode of diverticulosis.  Sigmoid colon thickening/inflammation with fistulas:  Patient with abdominal pain again overnight and given 30mg  injection of Toradol that resolved his pain somewhat. Appetite is improved as he is now on liquid diet. He continues to have thick feculent drainage from around the drain insertion site. - Surgery following, on full liquid diet s/p chest tube placement on 11/23. [facility]. Anticipating exploratory laparotomy, colectomy, and colostomy once pleural effusions addressed. - Vanc/Zosyn dosed per pharmacy (Day 7, started on 11/19) - Gripe water to help with GI spasms; baclofen for hiccups   - baclofen PRN spasm - tylenol PRN - zofran PRN  Loculated Left Pleural Effusion: Chest tube in place and draining well. Found on CT Abd and suspicous for PNA. CT chest obtained 11/21 and showed bilateral loculated pleural effusions. WBC at 5.3  - IR placed chest tube on 11/23; pleural culture negative, gram stain no growth - Continue Vanc/Zosyn for HCAP coverage - Continuous pulse ox  New Onset Afib: Resolved. He has no complaints of chest pain or palpitations and heat rate is now well controlled  with oral amiodarone. Cardiology has signed off and will follow up outpatient. They will continue oral amiodarone for a few more days and then discontinue.  - Continue Heparin; consider transition to eliquis in future per Cards but not now as surgery is anticipated. - Transitioned to PO amiodarone 200 mg BID, per Cards; anticipate reducing to daily in several days - Metoprolol decreased to 12.5 mg BID from 25 mg BID 11/22, per cards for lower heart rates  Hypoalbuminemia due to Severe protein-calorie malnutrition ([place]): - Per Nutrition continue Ensure Enlive PO BID and continue to monitor; appreciate recs. - Per Nutrition consider TPN if he is not meeting 90% of caloric needs several days before surgery.  AKI: Worsened overnight, Creatinine 1.33>1.27>1.02 >1.04 > 1.04 >1.31 > 1.91 baseline of  ~0.8. - Continue oral hydration and continued IVF 1/2 maintenance - Continue to follow BMP for resolution of AKI - Renal U/S to rule out hydronephrosis  Hyponatremia: Resolved.  Likely due to dehydration. - Continue to monitor BMP am  Thrombocytosis: Platelets in 500s.  It is not chronic.  Likely due to acute infection. - Monitor   Anemia: Hemoglobin stable at 8.0, BL ~ 9.0.  No signs of overt bleeding. - Monitor with labs  FEN/GI: Protonix, NPO for chest tube placement 11/23 then order full liquid diet Prophylaxis: Heparin  Disposition: continue inpatient management of GI spasms  Subjective:  Patient feels better this morning. He had another episode of lower mid abdominal pain rated at a 9/10 that last for several minutes and did respond to Toradol. He is still somewhat uncomfortable and having pain in his abdominal region. Otherwise, he states he  is feeling better but he appears more tired than yesterday. No other issues or complaints this morning.  Objective: Temp:  [97.7 F (36.5 C)-98.3 F (36.8 C)] 98.3 F (36.8 C) (11/25 0559) Pulse Rate:  [49-83] 49 (11/25 0559) Resp:  [16-20]  16 (11/25 0559) BP: (105-136)/(56-71) 107/56 (11/25 0559) SpO2:  [94 %-100 %] 94 % (11/25 0559)  Physical Exam: Gen: Alert and Oriented x 3, NAD CV: sinus rhythm with PVCs, normal rate, no murmurs Resp: decreased breath sounds bilaterally, especially over left lower lung field, no increased WOB; pigtail catheter left pleural chest in place Abd: non-distended, non-tender, soft, +bs in all four quadrants; LLQ drain in place - covered with dressing MSK: FROM in all four extremities Ext: no clubbing, cyanosis, or edema Skin: warm, dry, intact, no rashes  Laboratory: Recent Labs  Lab 11/12/17 0356 11/13/17 0317 11/14/17 0428  WBC 11.1* 9.1 5.3  HGB 8.0* 8.2* 8.8*  HCT 24.8* 24.9* 27.8*  PLT 525* 460* 479*   Recent Labs  Lab 11/08/17 1041 11/09/17 0304  11/12/17 0356 11/13/17 0317 11/14/17 0428  NA 129* 131*   < > 136 136 135  K 4.2 3.9   < > 4.6 3.6 3.8  CL 96* 101   < > 106 107 108  CO2 24 21*   < > 26 24 18*  BUN 39* 28*   < > 13 14 17   CREATININE 1.33* 1.27*   < > 1.04 1.38* 1.91*  CALCIUM 8.0* 7.5*   < > 7.6* 7.4* 7.3*  PROT 7.2 6.2*  --   --   --   --   BILITOT 0.5 0.4  --   --   --   --   ALKPHOS 92 73  --   --   --   --   ALT 14* 12*  --   --   --   --   AST 27 22  --   --   --   --   GLUCOSE 108* 106*   < > 118* 104* 88   < > = values in this interval not displayed.   11/19 - Lipase: 19 11/19 - Lactic Acid: 1.52, 1.15 11/20 - Troponin I: 0.03,  11/20 - TSH: 0.271 11/20 - T3: 0.9; T4: 0.98  Imaging/Diagnostic Tests:  Dg Chest 2 View  Result Date: 11/08/2017 IMPRESSION: Mild left basilar subsegmental atelectasis or infiltrate is noted. Followup PA and lateral chest X-ray is recommended in 3-4 weeks following trial of antibiotic therapy to ensure resolution and exclude underlying malignancy. Electronically Signed   By: Marijo Conception, M.D.   On: 11/08/2017 16:46   Ct Chest W Contrast  Result Date: 11/10/2017 IMPRESSION: 1. Loculated bilateral pleural  effusions, left greater than right. 2. Associated airspace disease likely reflects atelectasis. 3. Additional pleural irregularity on the left may represent infection associated with the fusion. 4. 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 Dextroconvex curvature is associated. Electronically Signed   By: San Morelle M.D.   On: 11/10/2017 15:19   Ct Abdomen Pelvis W Contrast  Result Date: 11/08/2017 IMPRESSION: 1. The position of the left abdominal percutaneous pigtail catheter appears stable since October. No residual abscess but continued and perhaps progressive circumferential colonic wall thickening and mesenteric inflammation near the catheter. 2. Increased low-density stool distending the upstream left colon and splenic flexure raising the possibility of partial obstruction. 3. Consider Colonoscopy: The persistent colonic wall thickening in #1 may be inflammatory but  colonic tumor is difficult to exclude. 4. New since October small to moderate size loculated left pleural effusion. Associated lung base atelectasis without definite pneumonia. Electronically Signed   By: Genevie Ann M.D.   On: 11/08/2017 15:30   CT Perc Pleural Drain Placement: 11/12/2017 CT-guided placement of a chest tube within the loculated left pleural effusion.  Nuala Alpha, DO 11/14/2017, 9:21 AM PGY-1, [place] Intern pager: [phone], text pages welcome

## 2017-11-14 NOTE — Anesthesia Procedure Notes (Signed)
Central Venous Catheter Insertion Performed by: Roderic Palau, MD, anesthesiologist Start/End11/25/2018 6:28 PM, 11/14/2017 6:38 PM Patient location: OR. Preanesthetic checklist: patient identified, IV checked, site marked, risks and benefits discussed, surgical consent, monitors and equipment checked, pre-op evaluation, timeout performed and anesthesia consent Position: Trendelenburg Lidocaine 1% used for infiltration and patient sedated Hand hygiene performed , maximum sterile barriers used  and Seldinger technique used Catheter size: 8 Fr Total catheter length 16. Central line was placed.Double lumen Procedure performed without using ultrasound guided technique. Attempts: 1 Following insertion, dressing applied, line sutured and Biopatch. Post procedure assessment: blood return through all ports and free fluid flow  Patient tolerated the procedure well with no immediate complications.

## 2017-11-14 NOTE — Op Note (Addendum)
Operative Note  Amaad Byers  [id number]  [id number]  11/14/2017   Surgeon: Victorino Sparrow ConnorMD  Assistant: Armandina Gemma MD  Procedure performed: Exploratory laparotomy, mobilization of the splenic flexure, partial sigmoid colectomy with end colostomy  Preop diagnosis: perforated diverticulitis Post-op diagnosis/intraop findings: diffuse purulent peritonitis, dense inflammatory process about the mid-sigmoid and percutaneous drain  Specimens: sigmoid colon Retained items: no EBL: [id number] Complications: none  Description of procedure: After obtaining informed consent the patient was taken to the operating room and placed supine on operating room table wheregeneral endotracheal anesthesia was initiated, preoperative antibiotics were administered, SCDs applied, and a formal timeout was performed. An arterial line and central line had been placed by anesthesia and a Foley catheter was introduced. The abdomen was prepped and draped in usual sterile fashion with Betadine. A vertical midline incision was created sharply and then cautery was used to dissect the soft tissues until linea alba could be identified and divided. Upon entering the abdomen, diffuse purulent fluid was present. This was aspirated. The colon was noted to be diffusely dilated but the small bowel really was not significantly dilated. The sigmoid colon was densely adherent to the lateral abdominal wall with a very thick inflammatory rind. There was a fistula to the mesentery of the proximal jejunum which was divided sharply. The fistula tract on the mesentery was opened with cautery to ensure that there was no communication with the bowel lumen. We began by entering the lesser sac and dividing the omentum from the transverse colon distally towards the splenic flexure which was carefully taken down with a combination of blunt and cautery dissection. The peritoneal reflection of the descending colon was incised and the colon  medialized proximal and distal to the inflammatory process. An area of healthy bowel near the rectosigmoid junction was cleaned off, and divided with a blue load linear cutting stapler. The stump was marked with two sutures of 2-0 prolene. The mesentery going to the diseased segment of colon was divided with LigaSure staying very close to the bowel wall. Blunt dissection and cautery was used to dissect the inflammatory rind and inflamed segment of colon from the lateral abdominal wall. There was copious spillage of stool during this process. The percutaneous drain was removed and discarded. Once we had mobilized this, the rest of its mesentery was divided with the LigaSure up to a point of healthy appearing bowel which was divided again with a blue load linear cutting stapler. The specimen was handed off. Hemostasis was ensured within the field. The ureter was visualized and was well away from our dissection. At this point the abdomen was copiously irrigated with several liters of warm sterile saline and all quadrants were lavaged and aspirated. The bowel was run from the ligament of Treitz to the ileocecal valve and found to be free of injury or other abnormality. The remaining colon appeared healthy albeit dilated. The stomach was free of injury or abnormality, and in the NG tube was palpated within the body of the stomach. We then chose a point for our colostomy in the left upper quadrant and a circular incision was made in the skin and subcutaneous fat was excised. A cruciate incision was made in the fascia, rectus muscle split, and the posterior sheath incised with cautery. This easily accommodated 2-3 fingers. The stapled end of the descending colon was brought out through this site and tethered with a Babcock. The colon was inspected to ensure no twisting. The midline was then closed with running looped #  1 PDS. Hemostasis was ensured within the wound. The wound was then covered with a wet-to-dry dressing and  the colostomy was matured in standard fashion with interrupted 3-0 Vicryl's. The ostomy was digitized and was easily able to pass the fascia. A colostomy appliance was placed. The patient was then taken to the ICU, intubated and in guarded condition for further resuscitation.  All counts were correct at the completion of the case.   I updated his family specifically his daughter Donella Stade, as to the operative findings and postoperative disposition.

## 2017-11-14 NOTE — Anesthesia Procedure Notes (Signed)
Arterial Line Insertion Start/End11/25/2018 6:00 PM, 11/14/2017 6:00 PM Performed by: CRNA  Patient location: Pre-op. Preanesthetic checklist: patient identified, IV checked, site marked, risks and benefits discussed, surgical consent, monitors and equipment checked, pre-op evaluation, timeout performed and anesthesia consent Lidocaine 1% used for infiltration Right, radial was placed Catheter size: 20 Fr Hand hygiene performed  and maximum sterile barriers used   Attempts: 1 Procedure performed without using ultrasound guided technique. Following insertion, dressing applied. Post procedure assessment: normal and unchanged

## 2017-11-14 NOTE — Progress Notes (Signed)
Surgery:  CT scan pending Considering the worsening of his pain, he may need to proceed with surgery for colectomy with colostomy tomorrow, if not sooner I have made him nothing by mouth except meds at this hour I have written to discontinue his heparin drip at 2 AM's Monday morning.   Roberto Owen. Dalbert Batman, M.D., [facility] General and Minimally invasive Surgery Breast and Colorectal Surgery Office:   [phone] Pager:   [phone]

## 2017-11-14 NOTE — Anesthesia Postprocedure Evaluation (Signed)
Anesthesia Post Note  Patient: Delvon Chipps  Procedure(s) Performed: EXPLORATORY LAPAROTOMY WITH HARTMANN PROCEDURE (N/A Abdomen)     Patient location during evaluation: ICU Anesthesia Type: General Level of consciousness: sedated Pain management: pain level controlled Vital Signs Assessment: post-procedure vital signs reviewed and stable Respiratory status: patient remains intubated per anesthesia plan Cardiovascular status: stable Postop Assessment: no apparent nausea or vomiting Anesthetic complications: no    Last Vitals:  Vitals:   11/14/17 2142 11/14/17 2200  BP: 105/70 (!) 105/58  Pulse:  (!) 57  Resp:  14  Temp:    SpO2: 100% 100%    Last Pain:  Vitals:   11/14/17 2135  TempSrc: Oral  PainSc:                  Richmond Coldren,W. EDMOND

## 2017-11-14 NOTE — Progress Notes (Signed)
Pt became lethargic and only responding to pain and voice, pt was alert and oriented to self and place this morning when rn assessed this afternoon pt was only alert to self and continuously reapting his birthday when asked where he was. VS BP 101/47 heart rate 59 BS 65 25 ML of D50 given . MD in room and order 1 L bolus. Bolus given and Bp still in low hundreds systolic, verbal order given to give another litter  pt, bolus started and pt became more lethargic and not responding to pain. Rn attempted to get o2 saturation and had difficulty getting reading on pts finger once Rn got reading o2 was in72 on room air and heart  Rate in 50s and according to [facility] heart rate dropped to 25.  At this Time MD instructed RN to call a code. Code blue called respiratory put pt on 5L and o2 went up to 100% and pt alert and oriented to self no other interventiosn needed pt escorted to CT with charge RN and MD. Will continue to monitor

## 2017-11-14 NOTE — Anesthesia Procedure Notes (Signed)
Procedure Name: Intubation Date/Time: 11/14/2017 6:26 PM Performed by: Clearnce Sorrel, CRNA Pre-anesthesia Checklist: Patient identified, Emergency Drugs available, Suction available, Patient being monitored and Timeout performed Patient Re-evaluated:Patient Re-evaluated prior to induction Oxygen Delivery Method: Circle system utilized Preoxygenation: Pre-oxygenation with 100% oxygen Induction Type: IV induction, Rapid sequence and Cricoid Pressure applied Laryngoscope Size: Mac and 4 Grade View: Grade I Tube type: Subglottic suction tube Tube size: 7.5 mm Number of attempts: 1 Airway Equipment and Method: Stylet Placement Confirmation: ETT inserted through vocal cords under direct vision,  positive ETCO2 and breath sounds checked- equal and bilateral Secured at: 23 cm Tube secured with: Tape Dental Injury: Teeth and Oropharynx as per pre-operative assessment

## 2017-11-14 NOTE — Progress Notes (Signed)
Subjective: Patient states he has increased left lower quadrant pain for the last 2 or 3 hours.  No nausea or vomiting. Denies chest pain or pain at pigtail catheter site left pleural space. Afebrile.  Heart rate 50. WBC 5300.  Hemoglobin 8.8.  Creatinine 1.91 slightly elevated  Draining stool around percutaneous drain and some stool through the drain. Remains on IV Zosyn and vancomycin.  On heparin drip and oral amiodarone, and Lopressor. Cardiology has signed off stating there are no active cardiology issues HR 83 -  NSR with PVC's  Underwent percutaneous placement of pigtail catheter into loculated left pleural effusion 11/23.Roberto Owen is hooked up to a Pleur-evac.  Seems to be working well.  IR following. Output not recorded but looks like about 40 mL of yellowish fluid. Chest x-ray looks okay this morning.  Pigtail and position.  No pneumothorax.  Await reading. Gram stain - NOS  He understands and agrees with the plan to proceed with [facility] resection this week. Decisions regarding timing and date of surgery will be made tomorrow morning, most likely CT scan now to make sure we are not missing abscess or obstruction.     Objective: Vital signs in last 24 hours: Temp:  [97.6 F (36.4 C)-98.3 F (36.8 C)] 98.3 F (36.8 C) (11/25 0559) Pulse Rate:  [49-83] 49 (11/25 0559) Resp:  [16-21] 16 (11/25 0559) BP: (105-136)/(56-71) 107/56 (11/25 0559) SpO2:  [94 %-100 %] 94 % (11/25 0559) Last BM Date: 11/13/17  Intake/Output from previous day: 11/24 0701 - 11/25 0700 In: 1980.1 [P.O.:180; I.V.:1595.1; IV Piggyback:200] Out: 250 [Urine:200; Drains:50] Intake/Output this shift: Total I/O In: 860.1 [I.V.:855.1; Other:5] Out: 250 [Urine:200; Drains:50]   PE: Gen: Alert, mild distress and doesn't feel as good as yesterday.  Somewhat significant since he usually does not complain much at all.  Mental status normal otherwise  Card:HR 50, pedal pulses 2+ BL Pulm:  Normal effort, bibasilar rales-faint. Pigtail catheter left pleural chest tubein place with yellowish drainage. Abd: Soft, borderline distended.  Bowel sounds hypoactive.  Tender left lower quadrant.  Drain with feculent drainage.   .Skin: warm and dry, no rashes  Psych: A&Ox3     Lab Results:  Recent Labs    11/13/17 0317 11/14/17 0428  WBC 9.1 5.3  HGB 8.2* 8.8*  HCT 24.9* 27.8*  PLT 460* 479*   BMET Recent Labs    11/13/17 0317 11/14/17 0428  NA 136 135  K 3.6 3.8  CL 107 108  CO2 24 18*  GLUCOSE 104* 88  BUN 14 17  CREATININE 1.38* 1.91*  CALCIUM 7.4* 7.3*   PT/INR No results for input(s): LABPROT, INR in the last 72 hours. ABG No results for input(s): PHART, HCO3 in the last 72 hours.  Invalid input(s): PCO2, PO2  Studies/Results: Ct Perc Pleural Drain W/indwell Cath W/img Guide  Result Date: 11/12/2017 INDICATION: 82 year old with a loculated left pleural effusion. Plan for CT-guided chest tube placement. EXAM: CT-GUIDED PLACEMENT OF LEFT CHEST TUBE MEDICATIONS: None ANESTHESIA/SEDATION: Fentanyl 50 mcg IV; Versed 1.0 mg IV Moderate Sedation Time:  24 minutes The patient was continuously monitored during the procedure by the interventional radiology nurse under my direct supervision. COMPLICATIONS: None immediate. PROCEDURE: Informed written consent was obtained from the patient after a thorough discussion of the procedural risks, benefits and alternatives. All questions were addressed. A timeout was performed prior to the initiation of the procedure. Patient was placed prone. CT images through the chest were obtained. Posterior loculated left pleural  effusion was targeted. The left side of the back was prepped and draped in sterile fashion. Skin and soft tissues were anesthetized with 1% lidocaine. 18 gauge trocar needle was directed into the pleural space with CT guidance. Clear yellow fluid was aspirated. Stiff Amplatz wire was placed. Tract was dilated to  accommodate a 12 [place] multipurpose drain. Drain was advanced over the wire. Clear yellow fluid was aspirated and sent for culture. Catheter was sutured to the skin and attached to a pleural fluid evacuation device. FINDINGS: Loculated effusion along the posterior left lower chest. IMPRESSION: CT-guided placement of a chest tube within the loculated left pleural effusion. Electronically Signed   By: Markus Daft M.D.   On: 11/12/2017 15:10    Anti-infectives: Anti-infectives (From admission, onward)   Start     Dose/Rate Route Frequency Ordered Stop   11/13/17 1600  vancomycin (VANCOCIN) IVPB 750 mg/150 ml premix     750 mg 150 mL/hr over 60 Minutes Intravenous Every 12 hours 11/13/17 1057     11/11/17 1600  vancomycin (VANCOCIN) IVPB 1000 mg/200 mL premix  Status:  Discontinued     1,000 mg 200 mL/hr over 60 Minutes Intravenous Every 12 hours 11/11/17 0443 11/13/17 1057   11/09/17 1600  vancomycin (VANCOCIN) IVPB 750 mg/150 ml premix  Status:  Discontinued     750 mg 150 mL/hr over 60 Minutes Intravenous Every 12 hours 11/09/17 1500 11/11/17 0443   11/09/17 0800  vancomycin (VANCOCIN) IVPB 750 mg/150 ml premix  Status:  Discontinued     750 mg 150 mL/hr over 60 Minutes Intravenous Every 12 hours 11/08/17 2152 11/09/17 1500   11/09/17 0200  piperacillin-tazobactam (ZOSYN) IVPB 3.375 g     3.375 g 12.5 mL/hr over 240 Minutes Intravenous Every 8 hours 11/08/17 1821     11/08/17 2200  vancomycin (VANCOCIN) 2,000 mg in sodium chloride 0.9 % 500 mL IVPB     2,000 mg 250 mL/hr over 120 Minutes Intravenous  Once 11/08/17 2152 11/09/17 0020   11/08/17 1830  piperacillin-tazobactam (ZOSYN) IVPB 3.375 g     3.375 g 100 mL/hr over 30 Minutes Intravenous  Once 11/08/17 1821 11/08/17 2134   11/08/17 1745  piperacillin-tazobactam (ZOSYN) IVPB 3.375 g  Status:  Discontinued     3.375 g 100 mL/hr over 30 Minutes Intravenous  Once 11/08/17 1733 11/08/17 1734      Assessment/Plan:  Sigmoid colon  thickening and inflammation.It is presumed that this is acute diverticulitis with perforation. 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 -IV antibiotics... Vanc.and Zosyn -Allow full liquid diet -He is not getting any better despite initial improvement. Colocutaneous fistula remains open following a more than adequate follow percutaneous drainage. He will need surgery this admission once medical problems are stabilized. -He has new onset of worsening left lower quadrant pain this morning.  No free air on chest x-ray.  No fever or leukocytosis.  We will proceed with CT scan abdomen and pelvis with contrast now to make sure were not missing abscess perforation or obstruction.  -He reaffirms that he had a normal colonoscopy in [place] one year ago. We will try to get that report.  Tachycardia-new-onset atrial fibrillation and flutter with RVR.Seems much better now with controlled heart rate on oral amiodarone/Lopressor and heparin drip.Looks like NSR with ectopy.  Loculated left pleural effusion. Left greater than right. pigtail  catheter placed successfully by IR 11/23  History peptic ulcer disease by endoscopy- protonix History anemia requiring transfusion    Plan: -CT scan abdomen and pelvis this morning to rule out intra-abdominal process that might require sooner surgical intervention  -Continue heparin drip. Do not start Eliquis as it looks like he will be needing surgery -Pigtail catheter drainage loculated left pleural effusion in place. -Cardiac problems now satisfactorily stabilized.  -Will try to obtain colonoscopy report from [place]  -Tentatively plan exploratorylaparotomy, left colectomy with colostomy during this hospitalization. The patient is in agreement and understands that he is not  improving -He understands that Dr. Donne Hazel will probably be his surgeon if the surgery is done next week.      LOS: 6 days    Adin Hector 11/14/2017

## 2017-11-14 NOTE — Progress Notes (Signed)
ANTICOAGULATION CONSULT NOTE - Follow Up Consult  Pharmacy Consult for Heparin Indication: atrial fibrillation  No Known Allergies  Patient Measurements: Height: 6\' 6"  (198.1 cm) Weight: 192 lb 10.9 oz (87.4 kg) IBW/kg (Calculated) : 91.4  Vital Signs: Temp: 98.3 F (36.8 C) (11/25 0559) Temp Source: Oral (11/25 0559) BP: 107/56 (11/25 0559) Pulse Rate: 49 (11/25 0559)  Labs: Recent Labs    11/12/17 0356 11/13/17 0317 11/14/17 0428  HGB 8.0* 8.2* 8.8*  HCT 24.8* 24.9* 27.8*  PLT 525* 460* 479*  HEPARINUNFRC 0.31 0.44 0.58  CREATININE 1.04 1.38* 1.91*    Estimated Creatinine Clearance: 38.1 mL/min (A) (by C-G formula based on SCr of 1.91 mg/dL (H)).   Medications:  Infusions:  . sodium chloride 75 mL/hr at 11/14/17 0056  . heparin 1,600 Units/hr (11/14/17 0056)  . piperacillin-tazobactam (ZOSYN)  IV 3.375 g (11/14/17 0655)  . vancomycin 750 mg (11/14/17 0505)    Assessment: 82 year old male admitted with diverticular abscess and colonic fistula. He has new atrial fibrillation and is on anticoagulation with IV heparin. Note he has a history of GI bleed due to NSAIDs in August 2018.   His heparin level remains therapeutic (0.58) today on 1600 units/hr after restart, which was a previous dose he was therapeutic on. No bleeding noted at this time and CBC remains stable through admission.  Goal of Therapy:  Heparin level 0.3-0.7 units/ml Monitor platelets by anticoagulation protocol: Yes   Plan:  Continue heparin gtt @ 1600 units/hr Daily heparin level and CBC Monitor for bleeding complications  Bertis Ruddy, PharmD Pharmacy Resident Pager #: [phone] 11/14/2017 7:38 AM

## 2017-11-14 NOTE — Progress Notes (Signed)
Received patient from OR. Placed on mechanical ventilation. 8cc Vt 620 R-14 40% 5 peep. ETT holder placed and secured 24 at teeth. AMBU bag at [facility].

## 2017-11-14 NOTE — Progress Notes (Addendum)
FPTS Interim Progress Note  S:Patient was altered.  CBG was 65.Dextrose was given.  Patient was not responsive.  Vitals were notable for a blood pressure of 104/46 -patient bradycardia down to 25 from 60s.  Pulse ox was finally obtained and was 72 on room air.  CODE BLUE was called.   Received 1 compression. Patient woke up, eyes open. GCS 14.  Vitals were obtained, present below.  CCM was consulted O: BP 138/71 (BP Location: Right Arm)   Pulse 70   Temp 97.8 F (36.6 C) (Axillary)   Resp 16   Ht 6\' 6"  (1.981 m)   Wt 192 lb 10.9 oz (87.4 kg)   SpO2 100%   BMI 22.27 kg/m    -Surgery was at bedside, Indicated that CT abdomen pelvis was needed -Workup still pending - CCM consult pending  - Daughter notified  -Dr. Grandville Silos to follow with patient to the CT scanner  Tonette Bihari, MD 11/14/2017, 2:27 PM PGY-3 [place] Service pager [phone]

## 2017-11-14 NOTE — Progress Notes (Addendum)
Patient with decreased mental status and workup showing sudden increase wbc to 30 from 5 this morning. His ct is not done yet. His abdomen is soft and not peritonitic. While I am evaluating him he became unresponsive, his hr dropped to 20s and sats 70s on the bedside monitor. Code called. He received one chest compression and awoke, HR 70s and BP normal on next check. Agree with thorough workup planned by primary team.   Addendum: CT reviewed. CT head shows a small subdural which is likely several days old. CT abdomen shows new pneumoperitoneum and increasing dilation of proximal colon. Plan laparotomy and Hartmans today. I have spoken with his daughter by Olene Craven and she is on the way. Heparin drip stopped just before 4pm.

## 2017-11-14 NOTE — Progress Notes (Signed)
   11/14/17 1400  Clinical Encounter Type  Visited With Health care provider  Visit Type Code  Referral From Nurse  Consult/Referral To Chaplain   Responded to a Code Blue.  When I arrived they called off the code and patient will remain in the same room.  No family with the patient.  Healthcare team continuing to care for the patient.  Will follow as needed. Chaplain Katherene Ponto

## 2017-11-14 NOTE — Progress Notes (Signed)
FPTS Interim Progress Note  S:Patient is altered.   O: BP (!) 101/47 (BP Location: Right Arm)   Pulse 62   Temp (!) 97.3 F (36.3 C) (Oral)   Resp 16   Ht 6\' 6"  (1.981 m)   Wt 192 lb 10.9 oz (87.4 kg)   SpO2 94%   BMI 22.27 kg/m   GCS of 12-13  Physical Exam  Eyes: Conjunctivae are normal. Pupils are equal, round, and reactive to light.  Cardiovascular: Normal rate and regular rhythm.  Pulmonary/Chest: Effort normal and breath sounds normal.  Abdominal: Soft. He exhibits distension.  Neurological:  Patient is able to tell me his name, that he is at [facility]. Repeats over and over again  [facility] of 12-13    A/P:  Altered Mental Status  -Patient is altered.  GCS of 12-13.  Alert and orientated to self sometimes place.  Patient is repeating answers over and over again.  Slight decrease in blood pressure from previous oxygenation within normal limits.  Temperature of 97.3 though patient was not following directions to hold the thermometer in his mouth when taking this temperature.  Currently on Vanco and Zosyn.  Lab work within normal limits, other than an elevation in patient's serum creatinine today.  Given pulse ox and respiratory rate unlikely respiratory failure.  Chest x-ray earlier today showing minimal left pleural effusion -otherwise unremarkable.  Blood pressure slightly low though patient's blood pressures have been in the low range.  Concern for infectious versus possible cardiac -currently pulse 60s.  Less likely sepsis given patient is on Vanco and Zosyn.  Patient could have had a stroke or brain bleed given he is on heparin.  Wells criteria 1.5 making it less likely for him to have had a PE.  Possible metabolic disorder given his creatinine has been elevated this morning.  Will recheck electrolytes and check an ammonia level.  - ABG - EKG and troponin  - CBC and CMET, Ammonia  - Lactic acid  - Repeat 1 view chest xray  - CT head  - NS bolus x1  - recheck vitals in 1  hours - Consider transfer to stepdown    Tonette Bihari, MD 11/14/2017, 1:02 PM PGY-3, [place] [facility] Service pager [phone]

## 2017-11-14 NOTE — Transfer of Care (Signed)
Immediate Anesthesia Transfer of Care Note  Patient: Roberto Owen  Procedure(s) Performed: EXPLORATORY LAPAROTOMY WITH HARTMANN PROCEDURE (N/A Abdomen)  Patient Location: ICU  Anesthesia Type:General  Level of Consciousness: sedated and Patient remains intubated per anesthesia plan  Airway & Oxygen Therapy: Patient remains intubated per anesthesia plan and Patient placed on Ventilator (see vital sign flow sheet for setting)  Post-op Assessment: Report given to RN and Post -op Vital signs reviewed and stable  Post vital signs: Reviewed and stable  Last Vitals:  Vitals:   11/14/17 1544 11/14/17 2135  BP: (!) 133/59   Pulse: 70   Resp:    Temp:  (!) 36.3 C  SpO2:      Last Pain:  Vitals:   11/14/17 2135  TempSrc: Oral  PainSc:       Patients Stated Pain Goal: 0 (70/92/95 7473)  Complications: No apparent anesthesia complications

## 2017-11-14 NOTE — Anesthesia Preprocedure Evaluation (Addendum)
Anesthesia Evaluation  Patient identified by MRN, date of birth, ID band Patient awake    Reviewed: Allergy & Precautions, H&P , NPO status , Patient's Chart, lab work & pertinent test results  Airway Mallampati: II  TM Distance: >3 FB Neck ROM: Full    Dental no notable dental hx. (+) Partial Lower, Partial Upper, Dental Advisory Given   Pulmonary neg pulmonary ROS,    Pulmonary exam normal breath sounds clear to auscultation       Cardiovascular + dysrhythmias Atrial Fibrillation  Rhythm:Irregular Rate:Normal     Neuro/Psych negative neurological ROS  negative psych ROS   GI/Hepatic Neg liver ROS, PUD,   Endo/Other  negative endocrine ROS  Renal/GU Renal InsufficiencyRenal disease  negative genitourinary   Musculoskeletal   Abdominal   Peds  Hematology negative hematology ROS (+) anemia ,   Anesthesia Other Findings   Reproductive/Obstetrics negative OB ROS                            Anesthesia Physical Anesthesia Plan  ASA: III and emergent  Anesthesia Plan: General   Post-op Pain Management:    Induction: Intravenous, Rapid sequence and Cricoid pressure planned  PONV Risk Score and Plan: 3 and Ondansetron, Dexamethasone and Treatment may vary due to age or medical condition  Airway Management Planned: Oral ETT  Additional Equipment: Arterial line  Intra-op Plan:   Post-operative Plan: Extubation in OR and Possible Post-op intubation/ventilation  Informed Consent: I have reviewed the patients History and Physical, chart, labs and discussed the procedure including the risks, benefits and alternatives for the proposed anesthesia with the patient or authorized representative who has indicated his/her understanding and acceptance.   Dental advisory given  Plan Discussed with: CRNA  Anesthesia Plan Comments:       Anesthesia Quick Evaluation

## 2017-11-14 NOTE — Progress Notes (Signed)
eLink Physician-Brief Progress Note Patient Name: Roberto Owen DOB: [birth year] MRN: [id number]   Date of Service  11/14/2017  HPI/Events of Note  P-lap, perf diverticulitis, intubated Hypotensive during case, now off pressors Head CT shows small SDH AF - rate controlled  eICU Interventions  Vent/sedation orders - propofol gtt + int fent Dc IV heparin gtt npo      Intervention Category Evaluation Type: New Patient Evaluation  Bowdy Bair V. Brooks Kinnan 11/14/2017, 10:05 PM

## 2017-11-14 NOTE — Consult Note (Signed)
Name: Victorhugo Preis MRN: [id number] DOB: [birth year]    ADMISSION DATE:  11/08/2017 CONSULTATION DATE:  11/14/17  REFERRING MD :  Kerrin Mo  CHIEF COMPLAINT:  confusion  BRIEF PATIENT DESCRIPTION: recurrent abdominal abscesses/diverticulitis now with perforation  SIGNIFICANT EVENTS  - 11/14/17 nonresponsive/bradycardic episode, self-resolved. CT: perforation. Taken to OR  STUDIES:  CT A/P 11/14/17: 1. New pneumoperitoneum compared to the prior examination, compatible with perforated viscus. The exact source of this is uncertain, but this is suspected be related to gas traversing the chronic diverticular abscess wall at the site of the indwelling catheter. This was discussed by phone with Dr. Romana Juniper. 2. Diffuse body wall edema, mesenteric edema and retroperitoneal edema with bilateral pleural effusions, suspicious for a state of anasarca. 3. Multiple nonobstructive calculi in the collecting systems of both kidneys measuring up to 8 mm in the lower pole collecting system of the right kidney. There is also some new high attenuation material lying dependently in the large right bladder diverticulum. This could represent multiple tiny calculi, but this is unlikely given the development compared to the recent prior examination. Rather, this is favored to represent some proteinaceous/hemorrhagic debris or residual contrast material from prior CT scan 11/08/2017. 4. Aortic atherosclerosis. 5. Additional findings, as above. Aortic Atherosclerosis (ICD10-I70.0).   HISTORY OF PRESENT ILLNESS: 82 year old male history of peptic ulcer disease, diverticulitis with abscess of the sigmoid colon, colocutaneous fistula with indwelling intraabdominal drain who presented with abdominal pain and draining from around his intra-abdominal drain site.  CT abdomen pelvis on 11/19 showed no residual abscess but circumferential colonic wall thickening.  He was myosin and Zosyn planned  artmann this week after being seen by surgery.  He was also noted to have a loculated pleural effusion for which interventional radiology placed a left-sided pigtail catheter on 11/23.  Overnight he had worsening abdominal pain and this morning he was found to be more confused.  He was noted to have an episode of bradycardia with heart rate in the 20s and became unresponsive, spO2 70's.  Chest compressions were initiated however after a single compression he awoke and reportedly pushed the team away.   Subsequent CT abdomen pelvis showed concern for perforated viscus and so he was taken to the operating room for exploratory laparotomy.  PAST MEDICAL HISTORY :   has a past medical history of Anemia, Bladder diverticulum (11/10/2017), Esophageal ulcer, Gastric ulcer, GI bleed, History of blood transfusion (07/2017), and NSAID induced gastritis.  has a past surgical history that includes Esophagogastroduodenoscopy (egd) with propofol (N/A, 07/25/2017); Colonoscopy (~ 05/2016); IR Radiologist Eval & Mgmt (09/30/2017); IR Catheter Tube Change (10/28/2017); and IR Radiologist Eval & Mgmt (10/19/2017). Prior to Admission medications   Medication Sig Start Date End Date Taking? Authorizing Provider  acetaminophen (TYLENOL) 500 MG tablet Take 1,000 mg by mouth every 6 (six) hours as needed for mild pain.   Yes [provider]  ferrous sulfate 325 (65 FE) MG tablet Take 1 tablet (325 mg total) by mouth daily with breakfast. 07/27/17  Yes Tawny Asal, MD  Multiple Vitamins-Minerals (ADULT ONE DAILY GUMMIES) CHEW Chew 1 tablet by mouth daily.    Yes [provider]  pantoprazole (PROTONIX) 40 MG tablet TAKE 1 TABLET BY MOUTH TWICE DAILY 09/10/17  Yes Lorella Nimrod, MD  Simethicone 180 MG CAPS Take 1 capsule every 4 (four) hours as needed by mouth (for gas, bloating).   Yes [provider]  Sod Bicarb-Ginger-Fennel-Cham (GRIPE WATER) LIQD Take 30 mLs every 4 (  four) hours as needed by mouth (for  gas).    Yes [provider]  traMADol (ULTRAM) 50 MG tablet Take 1 tablet (50 mg total) by mouth every 6 (six) hours as needed for moderate pain or severe pain. Patient not taking: Reported on 10/23/2017 09/21/17   Aline August, MD   No Known Allergies  FAMILY HISTORY:  family history includes Arthritis in his father and mother. SOCIAL HISTORY:  reports that  has never smoked. he has never used smokeless tobacco. He reports that he does not drink alcohol or use drugs.  SUBJECTIVE:   REVIEW OF SYSTEMS:   Unable to obtain  VITAL SIGNS: Temp:  [97.3 F (36.3 C)-98.3 F (36.8 C)] 97.8 F (36.6 C) (11/25 1414) Pulse Rate:  [49-91] 70 (11/25 1544) Resp:  [16-20] 16 (11/25 1414) BP: (101-138)/(47-73) 133/59 (11/25 1544) SpO2:  [94 %-100 %] 100 % (11/25 1414)  PHYSICAL EXAMINATION: Physical Exam  Constitutional:  Confused, sitting up in bed acutely ill-appearing  HENT:  Head: Normocephalic and atraumatic.  Neck: No JVD present.  Cardiovascular: Normal rate and regular rhythm.  No murmur heard. Pulmonary/Chest: Effort normal. No stridor.  Left-sided chest tube in place.  Abdominal:  Intra-abdominal drain with feculent material in collection bag.  Tender to palpation left lower quadrant.  Musculoskeletal: He exhibits no edema or tenderness.  Neurological: He is alert.  Oriented to name only.  Speech fluent though coherent  Skin: Skin is warm and dry.     Recent Labs  Lab 11/13/17 0317 11/14/17 0428 11/14/17 1311  NA 136 135 136  K 3.6 3.8 2.9*  CL 107 108 107  CO2 24 18* 22  BUN 14 17 18   CREATININE 1.38* 1.91* 2.26*  GLUCOSE 104* 88 86   Recent Labs  Lab 11/13/17 0317 11/14/17 0428 11/14/17 1311  HGB 8.2* 8.8* 7.8*  HCT 24.9* 27.8* 24.3*  WBC 9.1 5.3 32.1*  PLT 460* 479* 482*   Ct Abdomen Pelvis Wo Contrast  Result Date: 11/14/2017 CLINICAL DATA:  82 year old male presenting with signs and symptoms suspicious for potential bowel obstruction.  EXAM: CT ABDOMEN AND PELVIS WITHOUT CONTRAST TECHNIQUE: Multidetector CT imaging of the abdomen and pelvis was performed following the standard protocol without IV contrast. COMPARISON:  CT the abdomen and pelvis 11/08/2017. FINDINGS: Lower chest: Pigtail drainage catheter in the lower left hemithorax posteriorly. Small left-sided pleural effusion lying dependently. Trace right pleural effusion lying dependently. Areas of apparent subsegmental atelectasis in the lower lobes of lungs bilaterally. Hepatobiliary: Numerous tiny calcified granulomas. No other definite suspicious appearing hepatic lesions noted throughout the liver on today's noncontrast CT examination. Amorphous intermediate to high attenuation material lying dependently in the gallbladder, presumably biliary sludge. Pancreas: No definite pancreatic mass noted on today's noncontrast CT examination. Small amount of peripancreatic stranding noted, but nonspecific given generalize soft tissues trending throughout the retroperitoneum, mesenteric and diffuse body wall edema. Spleen: Unremarkable. Adrenals/Urinary Tract: Several nonobstructive calculi are noted within the collecting systems of both kidneys, measuring up to 8 mm in the lower pole collecting system of the right kidney. No ureteral stones. No hydroureteronephrosis. Bilateral adrenal glands are normal in appearance. Large right-sided bladder diverticulum with some high attenuation material lying dependently in this diverticulum, new compared to the prior examination from 11/08/2017, potentially residual contrast material or proteinaceous/hemorrhagic debris. Stomach/Bowel: Unenhanced appearance of the stomach is unremarkable. No pathologic dilatation of small bowel. Colon appears moderately distended with multiple air-fluid levels. Previously noted diverticular abscess with indwelling pigtail drainage catheter  in the distal descending colon (axial image 57 of series 3) remains completely  decompressed. Soft tissue stranding adjacent to the decompressed abscess is noted, as well as adjacent locules of extraluminal gas (axial image 58 of series 3) which are new compared to the prior study. The appendix is not confidently identified and may be surgically absent. Regardless, there are no inflammatory changes noted adjacent to the cecum to suggest the presence of an acute appendicitis at this time. Vascular/Lymphatic: Aortic atherosclerosis, without evidence of aneurysm in the abdominal or pelvic vasculature. No lymphadenopathy noted in the abdomen or pelvis on today's noncontrast CT examination. Reproductive: Prostate gland and seminal vesicles are incompletely imaged. Other: New pneumoperitoneum (moderate volume) noted. Trace volume of ascites. Diffuse mesenteric and retroperitoneal edema. Musculoskeletal: Diffuse body wall edema. There are no aggressive appearing lytic or blastic lesions noted in the visualized portions of the skeleton. IMPRESSION: 1. New pneumoperitoneum compared to the prior examination, compatible with perforated viscus. The exact source of this is uncertain, but this is suspected be related to gas traversing the chronic diverticular abscess wall at the site of the indwelling catheter. This was discussed by phone with Dr. Romana Juniper. 2. Diffuse body wall edema, mesenteric edema and retroperitoneal edema with bilateral pleural effusions, suspicious for a state of anasarca. 3. Multiple nonobstructive calculi in the collecting systems of both kidneys measuring up to 8 mm in the lower pole collecting system of the right kidney. There is also some new high attenuation material lying dependently in the large right bladder diverticulum. This could represent multiple tiny calculi, but this is unlikely given the development compared to the recent prior examination. Rather, this is favored to represent some proteinaceous/hemorrhagic debris or residual contrast material from prior CT scan  11/08/2017. 4. Aortic atherosclerosis. 5. Additional findings, as above. Aortic Atherosclerosis (ICD10-I70.0). These results were called by telephone at the time of interpretation on 11/14/2017 at 3:35 pm to Dr. Kae Heller, who verbally acknowledged these results. Electronically Signed   By: Vinnie Langton M.D.   On: 11/14/2017 15:48   Ct Head Wo Contrast  Result Date: 11/14/2017 CLINICAL DATA:  82 y/o  M; altered mental status, rule out stroke. EXAM: CT HEAD WITHOUT CONTRAST TECHNIQUE: Contiguous axial images were obtained from the base of the skull through the vertex without intravenous contrast. COMPARISON:  None. FINDINGS: Brain: No evidence of acute infarction, hemorrhage, hydrocephalus or mass lesion/mass effect. Mild chronic microvascular ischemic changes and parenchymal volume loss of the brain for age. 4 mm trace subdural collection in the right cerebral convexity with intermittent attenuation, probably subdural hematoma (series 10, image 39). No significant mass effect. Vascular: Calcific atherosclerosis of carotid siphons. No hyperdense vessel. Skull: Normal. Negative for fracture or focal lesion. Sinuses/Orbits: Mild ethmoid sinus mucosal thickening. Otherwise negative. Other: None. IMPRESSION: 1. Thin subdural collection over right cerebral convexity, likely subdural hematoma. No significant mass effect. 2. No evidence for stroke, brain parenchymal hemorrhage, or mass effect. 3. Mild chronic microvascular ischemic changes and parenchymal volume loss of the brain. Critical Value/emergent results were called by telephone at the time of interpretation on 11/14/2017 at 3:34 pm to Dr. Kae Heller, who verbally acknowledged these results. Electronically Signed   By: Kristine Garbe M.D.   On: 11/14/2017 15:31   Dg Chest Port 1 View  Result Date: 11/14/2017 CLINICAL DATA:  82 year old male with altered mental status. EXAM: PORTABLE CHEST 1 VIEW COMPARISON:  11/14/2017. FINDINGS: Cardiomediastinal  silhouette enlarged but stable. There is mild pulmonary vascular congestion without frank  edema. No focal parenchymal consolidation. Likely small left pleural effusion and associated atelectasis, superimposed consolidation not excluded. No pneumothorax identified. Pigtail catheter overlies the left upper quadrant. No acute osseous abnormalities. IMPRESSION: Grossly stable appearance of the chest with small left basilar effusion/atelectasis, superimposed consolidation not excluded. Electronically Signed   By: Kristopher Oppenheim M.D.   On: 11/14/2017 13:39   Dg Chest Port 1 View  Result Date: 11/14/2017 CLINICAL DATA:  Left pleural effusion. EXAM: PORTABLE CHEST 1 VIEW COMPARISON:  Radiographs of November 08, 2017. FINDINGS: Stable cardiomediastinal silhouette. No pneumothorax is noted. Right lung is clear. Mild left basilar atelectasis or infiltrate is noted with possible minimal left pleural effusion. Bony thorax is unremarkable. IMPRESSION: Mild left basilar atelectasis or infiltrate is noted with minimal left pleural effusion. Electronically Signed   By: Marijo Conception, M.D.   On: 11/14/2017 07:30    ASSESSMENT / PLAN:   82 year old male history of peptic ulcer disease, diverticulitis with abscess of the sigmoid colon, colocutaneous fistula with indwelling intraabdominal drain who presented with abdominal pain and draining from around his intra-abdominal drain site, found to have a loculated pleural effusion for which a left sided chest tube was placed.  Now progressively encephalopathic with CT showing concern for perforated viscus plan for exploratory laparotomy today.  # Metabolic encephalopathy, sepsis due to perforated viscous -Agree with empiric antibiotics per primary team -Agree with plan for exploratory laparotomy for surgery -Patient is confused though clearly protecting his airway, alert, conversant without current oxygen requirement -I have asked the primary team to discuss whether the  patient will be transferred to the surgical service postop.  If not he should be reassessed as he is currently not appropriate for the medicine floor  # Atrial fibrillation - heparin on hold for surgery - continue amiodarone per Cardiology  # Loculated pleural effusion - gram stain and culture negative, recommend sending basic pleural fluid studies ie LDH, protein, cytology given concern for possible intraabdominal malignancy  # AKI - presumed due to sepsis. Aggressive fluid resuscitation for MAP>65. Monitor UOP and renal indices. Renally dose all medications  Mali Epic Tribbett, MD [facility] [place] Pager: [phone]  11/14/2017, 7:19 PM

## 2017-11-15 ENCOUNTER — Encounter (HOSPITAL_COMMUNITY): Payer: Self-pay | Admitting: *Deleted

## 2017-11-15 ENCOUNTER — Inpatient Hospital Stay (HOSPITAL_COMMUNITY): Payer: Medicare Other

## 2017-11-15 LAB — GLUCOSE, CAPILLARY
GLUCOSE-CAPILLARY: 116 mg/dL — AB (ref 65–99)
Glucose-Capillary: 105 mg/dL — ABNORMAL HIGH (ref 65–99)
Glucose-Capillary: 108 mg/dL — ABNORMAL HIGH (ref 65–99)
Glucose-Capillary: 113 mg/dL — ABNORMAL HIGH (ref 65–99)
Glucose-Capillary: 133 mg/dL — ABNORMAL HIGH (ref 65–99)
Glucose-Capillary: 151 mg/dL — ABNORMAL HIGH (ref 65–99)

## 2017-11-15 LAB — TYPE AND SCREEN
ABO/RH(D): O POS
Antibody Screen: NEGATIVE
UNIT DIVISION: 0
Unit division: 0

## 2017-11-15 LAB — BLOOD GAS, ARTERIAL
ACID-BASE DEFICIT: 5.8 mmol/L — AB (ref 0.0–2.0)
BICARBONATE: 16.8 mmol/L — AB (ref 20.0–28.0)
DRAWN BY: 347621
FIO2: 40
LHR: 20 {breaths}/min
MECHVT: 620 mL
O2 SAT: 98.7 %
PEEP/CPAP: 5 cmH2O
PH ART: 7.512 — AB (ref 7.350–7.450)
PO2 ART: 119 mmHg — AB (ref 83.0–108.0)
Patient temperature: 97.9
pCO2 arterial: 21.1 mmHg — ABNORMAL LOW (ref 32.0–48.0)

## 2017-11-15 LAB — CBC
HEMATOCRIT: 26.5 % — AB (ref 39.0–52.0)
Hemoglobin: 8.8 g/dL — ABNORMAL LOW (ref 13.0–17.0)
MCH: 24.8 pg — ABNORMAL LOW (ref 26.0–34.0)
MCHC: 33.2 g/dL (ref 30.0–36.0)
MCV: 74.6 fL — AB (ref 78.0–100.0)
Platelets: 399 10*3/uL (ref 150–400)
RBC: 3.55 MIL/uL — ABNORMAL LOW (ref 4.22–5.81)
RDW: 16.5 % — AB (ref 11.5–15.5)
WBC: 19.5 10*3/uL — ABNORMAL HIGH (ref 4.0–10.5)

## 2017-11-15 LAB — BASIC METABOLIC PANEL
Anion gap: 6 (ref 5–15)
BUN: 18 mg/dL (ref 6–20)
CALCIUM: 6.5 mg/dL — AB (ref 8.9–10.3)
CO2: 17 mmol/L — ABNORMAL LOW (ref 22–32)
CREATININE: 2.21 mg/dL — AB (ref 0.61–1.24)
Chloride: 113 mmol/L — ABNORMAL HIGH (ref 101–111)
GFR calc Af Amer: 31 mL/min — ABNORMAL LOW (ref >60)
GFR calc non Af Amer: 26 mL/min — ABNORMAL LOW (ref >60)
GLUCOSE: 159 mg/dL — AB (ref 65–99)
Potassium: 3.6 mmol/L (ref 3.5–5.1)
Sodium: 136 mmol/L (ref 135–145)

## 2017-11-15 LAB — POCT I-STAT 3, ART BLOOD GAS (G3+)
Acid-base deficit: 6 mmol/L — ABNORMAL HIGH (ref 0.0–2.0)
Bicarbonate: 15.7 mmol/L — ABNORMAL LOW (ref 20.0–28.0)
O2 Saturation: 100 %
PCO2 ART: 21.4 mmHg — AB (ref 32.0–48.0)
TCO2: 16 mmol/L — ABNORMAL LOW (ref 22–32)
pH, Arterial: 7.475 — ABNORMAL HIGH (ref 7.350–7.450)
pO2, Arterial: 163 mmHg — ABNORMAL HIGH (ref 83.0–108.0)

## 2017-11-15 LAB — BPAM RBC
BLOOD PRODUCT EXPIRATION DATE: 201812172359
Blood Product Expiration Date: 201812172359
ISSUE DATE / TIME: 201811251839
ISSUE DATE / TIME: 201811251839
UNIT TYPE AND RH: 5100
Unit Type and Rh: 5100

## 2017-11-15 LAB — MRSA PCR SCREENING: MRSA BY PCR: POSITIVE — AB

## 2017-11-15 LAB — VANCOMYCIN, TROUGH: VANCOMYCIN TR: 37 ug/mL — AB (ref 15–20)

## 2017-11-15 LAB — TRIGLYCERIDES: Triglycerides: 60 mg/dL (ref <150)

## 2017-11-15 LAB — VANCOMYCIN, RANDOM: Vancomycin Rm: 12

## 2017-11-15 MED ORDER — AMIODARONE HCL 200 MG PO TABS
200.0000 mg | ORAL_TABLET | Freq: Two times a day (BID) | ORAL | Status: DC
  Administered 2017-11-15 – 2017-11-19 (×8): 200 mg
  Filled 2017-11-15 (×9): qty 1

## 2017-11-15 MED ORDER — VANCOMYCIN HCL 10 G IV SOLR
1250.0000 mg | INTRAVENOUS | Status: DC
  Administered 2017-11-15: 1250 mg via INTRAVENOUS
  Filled 2017-11-15 (×2): qty 1250

## 2017-11-15 MED ORDER — CHLORHEXIDINE GLUCONATE 0.12% ORAL RINSE (MEDLINE KIT)
15.0000 mL | Freq: Two times a day (BID) | OROMUCOSAL | Status: DC
  Administered 2017-11-15 – 2017-11-26 (×15): 15 mL via OROMUCOSAL

## 2017-11-15 MED ORDER — ATROPINE SULFATE 1 MG/10ML IJ SOSY
PREFILLED_SYRINGE | INTRAMUSCULAR | Status: AC
  Filled 2017-11-15: qty 10

## 2017-11-15 MED ORDER — METOPROLOL TARTRATE 25 MG/10 ML ORAL SUSPENSION
12.5000 mg | Freq: Two times a day (BID) | ORAL | Status: DC
  Administered 2017-11-16 – 2017-11-19 (×5): 12.5 mg
  Filled 2017-11-15 (×9): qty 5

## 2017-11-15 MED ORDER — DEXTROSE 5 % IV SOLN
0.0000 ug/min | INTRAVENOUS | Status: DC
  Filled 2017-11-15: qty 4

## 2017-11-15 MED ORDER — ORAL CARE MOUTH RINSE
15.0000 mL | Freq: Four times a day (QID) | OROMUCOSAL | Status: DC
  Administered 2017-11-15 – 2017-11-26 (×31): 15 mL via OROMUCOSAL

## 2017-11-15 MED ORDER — PANTOPRAZOLE SODIUM 40 MG PO PACK
40.0000 mg | PACK | Freq: Two times a day (BID) | ORAL | Status: DC
  Administered 2017-11-15 – 2017-11-19 (×9): 40 mg
  Filled 2017-11-15 (×9): qty 20

## 2017-11-15 MED ORDER — CHLORHEXIDINE GLUCONATE CLOTH 2 % EX PADS
6.0000 | MEDICATED_PAD | Freq: Every day | CUTANEOUS | Status: DC
  Administered 2017-11-16 – 2017-11-18 (×3): 6 via TOPICAL

## 2017-11-15 MED ORDER — ACETAMINOPHEN 160 MG/5ML PO SOLN
650.0000 mg | Freq: Four times a day (QID) | ORAL | Status: DC
  Administered 2017-11-15 – 2017-11-19 (×16): 650 mg
  Filled 2017-11-15 (×23): qty 20.3

## 2017-11-15 MED ORDER — SODIUM CHLORIDE 0.9% FLUSH: 10.0000 mL | INTRAVENOUS | Status: DC | PRN

## 2017-11-15 NOTE — Progress Notes (Signed)
Pharmacy Antibiotic Note  Roberto Owen is a 82 y.o. male admitted on 11/08/2017 with intra-abdominal infection.  Pharmacy has been consulted for Vancomycin dosing. Vancomycin was held last night due to increasing Scr (1.04>>1.38>>2.26) and a random vancomycin level was drawn this AM was 12. Another level was collected at ~ 4pm and resulted at 37 (last dose was at 5:30am today)  Plan: -Hold vancomycin for now -Will recheck a vancomycin level on 11/27 with am labs  Height: 6\' 6"  (198.1 cm) Weight: 192 lb 10.9 oz (87.4 kg) IBW/kg (Calculated) : 91.4  Temp (24hrs), Avg:97.6 F (36.4 C), Min:97.4 F (36.3 C), Max:98.1 F (36.7 C)  Recent Labs  Lab 11/08/17 2139  11/11/17 0319 11/12/17 0356 11/13/17 0317 11/14/17 0428 11/14/17 1311 11/14/17 1531 11/15/17 0343 11/15/17 1540  WBC  --    < > 10.9* 11.1* 9.1 5.3 32.1*  --  19.5*  --   CREATININE  --    < > 1.04 1.04 1.38* 1.91* 2.26*  --  2.21*  --   LATICACIDVEN 1.15  --   --   --   --   --  1.8 1.9  --   --   VANCOTROUGH  --   --  13*  --   --   --   --   --   --  37*  VANCORANDOM  --   --   --   --   --   --   --   --  12  --    < > = values in this interval not displayed.    Estimated Creatinine Clearance: 33 mL/min (A) (by C-G formula based on SCr of 2.21 mg/dL (H)).    No Known Allergies   Hildred Laser, Pharm D 11/15/2017 5:45 PM

## 2017-11-15 NOTE — Progress Notes (Signed)
PT Cancellation Note  Patient Details Name: Roberto Owen MRN: [id number] DOB: [birth year]   Cancelled Treatment:    Reason Eval/Treat Not Completed: Medical issues which prohibited therapy(Pt with code and surgery 11/25 late evening and soft BP. HOLD today per nurse.)   Denice Paradise 11/15/2017, 11:47 AM [facility] [phone] (pager)

## 2017-11-15 NOTE — Progress Notes (Signed)
CRITICAL VALUE ALERT  Critical Value:  Vancomycin 37  Date & Time Notied:  11/15/17 1625  Provider Notified: Marni Griffon, NP  Orders Received/Actions taken: Pharmacy advised to hold next dose.

## 2017-11-15 NOTE — Progress Notes (Signed)
PULMONARY / CRITICAL CARE MEDICINE   Name: Roberto Owen MRN: [id number] DOB: October 19, 1935    ADMISSION DATE:  11/08/2017 CONSULTATION DATE:  11/14/17  REFERRING MD:  FPTS  CHIEF COMPLAINT:  confusion  HISTORY OF PRESENT ILLNESS:   82 year old male history of peptic ulcer disease, diverticulitis with abscess of the sigmoid colon, colocutaneous fistula with indwelling intraabdominal drain who presented with abdominal pain and draining from around his intra-abdominal drain site.  CT abdomen pelvis on 11/19 showed no residual abscess but circumferential colonic wall thickening.  He was myosin and Zosyn planned artmann this week after being seen by surgery.  He was also noted to have a loculated pleural effusion for which interventional radiology placed a left-sided pigtail catheter on 11/23.  Overnight he had worsening abdominal pain and this morning he was found to be more confused.  He was noted to have an episode of bradycardia with heart rate in the 20s and became unresponsive, spO2 70's.  Chest compressions were initiated however after a single compression he awoke and reportedly pushed the team away.   Subsequent CT abdomen pelvis showed concern for perforated viscus and so he was taken to the operating room for exploratory laparotomy.  PAST MEDICAL HISTORY :  He  has a past medical history of Anemia, Bladder diverticulum (11/10/2017), Esophageal ulcer, Gastric ulcer, GI bleed, History of blood transfusion (07/2017), and NSAID induced gastritis.  PAST SURGICAL HISTORY: He  has a past surgical history that includes Esophagogastroduodenoscopy (egd) with propofol (N/A, 07/25/2017); Colonoscopy (~ 05/2016); IR Radiologist Eval & Mgmt (09/30/2017); IR Catheter Tube Change (10/28/2017); and IR Radiologist Eval & Mgmt (10/19/2017).  No Known Allergies  No current facility-administered medications on file prior to encounter.    Current Outpatient Medications on File Prior to Encounter  Medication  Sig  . acetaminophen (TYLENOL) 500 MG tablet Take 1,000 mg by mouth every 6 (six) hours as needed for mild pain.  . ferrous sulfate 325 (65 FE) MG tablet Take 1 tablet (325 mg total) by mouth daily with breakfast.  . Multiple Vitamins-Minerals (ADULT ONE DAILY GUMMIES) CHEW Chew 1 tablet by mouth daily.   . pantoprazole (PROTONIX) 40 MG tablet TAKE 1 TABLET BY MOUTH TWICE DAILY  . Simethicone 180 MG CAPS Take 1 capsule every 4 (four) hours as needed by mouth (for gas, bloating).  . Sod Bicarb-Ginger-Fennel-Cham (GRIPE WATER) LIQD Take 30 mLs every 4 (four) hours as needed by mouth (for gas).   . traMADol (ULTRAM) 50 MG tablet Take 1 tablet (50 mg total) by mouth every 6 (six) hours as needed for moderate pain or severe pain. (Patient not taking: Reported on 10/23/2017)    FAMILY HISTORY:  His indicated that the status of his mother is unknown. He indicated that the status of his father is unknown. He indicated that the status of his neg hx is unknown.   SOCIAL HISTORY: He  reports that  has never smoked. he has never used smokeless tobacco. He reports that he does not drink alcohol or use drugs.  REVIEW OF SYSTEMS:   Unable to provide 2/2 sedation.   SUBJECTIVE:  Patient intubated and sedated.   VITAL SIGNS: BP (!) 96/47   Pulse (!) 56   Temp (!) 97.5 F (36.4 C) (Oral)   Resp 20   Ht 6\' 6"  (1.981 m)   Wt 192 lb 10.9 oz (87.4 kg)   SpO2 100%   BMI 22.27 kg/m   HEMODYNAMICS:    VENTILATOR SETTINGS: Vent Mode: PRVC FiO2 (%):  [  30 %-40 %] 30 % Set Rate:  [14 bmp-20 bmp] 20 bmp Vt Set:  [620 mL] 620 mL PEEP:  [5 cmH20] 5 cmH20 Plateau Pressure:  [19 cmH20-20 cmH20] 20 cmH20  INTAKE / OUTPUT: I/O last 3 completed shifts: In: 3355.8 [I.V.:1970.8; Blood:630; Other:5; IV Piggyback:750] Out: 323 [Urine:435; Drains:50; Other:50; Blood:100; Chest Tube:50]  PHYSICAL EXAMINATION: General:  Ill appearing male, lying in bed sedated.  Neuro:  Sedated, no distress HEENT:  Poor  dentition, ETT and OG in place, MMM Cardiovascular:  Irregular rhythm, no murmur Lungs:  Coarse breath sounds bilaterally, decreased over left base Abdomen:  Soft, nondistended, ostomy pink with output, dressings C/D/I  Skin:  No rashes   LABS:  BMET Recent Labs  Lab 11/14/17 0428 11/14/17 1311  11/14/17 1947 11/14/17 2047 11/15/17 0343  NA 135 136   < > 140 140 136  K 3.8 2.9*   < > 3.8 3.8 3.6  CL 108 107  --   --   --  113*  CO2 18* 22  --   --   --  17*  BUN 17 18  --   --   --  18  CREATININE 1.91* 2.26*  --   --   --  2.21*  GLUCOSE 88 86  --  112*  --  159*   < > = values in this interval not displayed.    Electrolytes Recent Labs  Lab 11/08/17 2125  11/14/17 0428 11/14/17 1311 11/15/17 0343  CALCIUM  --    < > 7.3* 7.1* 6.5*  MG 2.2  --   --   --   --    < > = values in this interval not displayed.    CBC Recent Labs  Lab 11/14/17 0428 11/14/17 1311  11/14/17 1947 11/14/17 2047 11/15/17 0343  WBC 5.3 32.1*  --   --   --  19.5*  HGB 8.8* 7.8*   < > 8.5* 9.2* 8.8*  HCT 27.8* 24.3*   < > 25.0* 27.0* 26.5*  PLT 479* 482*  --   --   --  399   < > = values in this interval not displayed.    Coag's Recent Labs  Lab 11/08/17 2125  APTT 33  INR 1.11    Sepsis Markers Recent Labs  Lab 11/08/17 2139 11/14/17 1311 11/14/17 1531  LATICACIDVEN 1.15 1.8 1.9    ABG Recent Labs  Lab 11/14/17 1855 11/14/17 2047 11/14/17 2349  PHART 7.335* 7.275* 7.512*  PCO2ART 39.9 43.6 21.1*  PO2ART 297.0* 249.0* 119*    Liver Enzymes Recent Labs  Lab 11/08/17 1041 11/09/17 0304 11/14/17 1311  AST 27 22 21   ALT 14* 12* 11*  ALKPHOS 92 73 61  BILITOT 0.5 0.4 0.7  ALBUMIN 1.7* 1.5* 1.2*    Cardiac Enzymes Recent Labs  Lab 11/08/17 2125 11/09/17 0304 11/14/17 1311  TROPONINI 0.04* 0.03* <0.03    Glucose Recent Labs  Lab 11/14/17 1314 11/14/17 1411 11/14/17 2132 11/14/17 2356 11/15/17 0334  GLUCAP 65 62* 68 100* 151*    Imaging Ct  Abdomen Pelvis Wo Contrast  Result Date: 11/14/2017 CLINICAL DATA:  82 year old male presenting with signs and symptoms suspicious for potential bowel obstruction. EXAM: CT ABDOMEN AND PELVIS WITHOUT CONTRAST TECHNIQUE: Multidetector CT imaging of the abdomen and pelvis was performed following the standard protocol without IV contrast. COMPARISON:  CT the abdomen and pelvis 11/08/2017. FINDINGS: Lower chest: Pigtail drainage catheter in the lower left hemithorax posteriorly. Small left-sided  pleural effusion lying dependently. Trace right pleural effusion lying dependently. Areas of apparent subsegmental atelectasis in the lower lobes of lungs bilaterally. Hepatobiliary: Numerous tiny calcified granulomas. No other definite suspicious appearing hepatic lesions noted throughout the liver on today's noncontrast CT examination. Amorphous intermediate to high attenuation material lying dependently in the gallbladder, presumably biliary sludge. Pancreas: No definite pancreatic mass noted on today's noncontrast CT examination. Small amount of peripancreatic stranding noted, but nonspecific given generalize soft tissues trending throughout the retroperitoneum, mesenteric and diffuse body wall edema. Spleen: Unremarkable. Adrenals/Urinary Tract: Several nonobstructive calculi are noted within the collecting systems of both kidneys, measuring up to 8 mm in the lower pole collecting system of the right kidney. No ureteral stones. No hydroureteronephrosis. Bilateral adrenal glands are normal in appearance. Large right-sided bladder diverticulum with some high attenuation material lying dependently in this diverticulum, new compared to the prior examination from 11/08/2017, potentially residual contrast material or proteinaceous/hemorrhagic debris. Stomach/Bowel: Unenhanced appearance of the stomach is unremarkable. No pathologic dilatation of small bowel. Colon appears moderately distended with multiple air-fluid levels.  Previously noted diverticular abscess with indwelling pigtail drainage catheter in the distal descending colon (axial image 57 of series 3) remains completely decompressed. Soft tissue stranding adjacent to the decompressed abscess is noted, as well as adjacent locules of extraluminal gas (axial image 58 of series 3) which are new compared to the prior study. The appendix is not confidently identified and may be surgically absent. Regardless, there are no inflammatory changes noted adjacent to the cecum to suggest the presence of an acute appendicitis at this time. Vascular/Lymphatic: Aortic atherosclerosis, without evidence of aneurysm in the abdominal or pelvic vasculature. No lymphadenopathy noted in the abdomen or pelvis on today's noncontrast CT examination. Reproductive: Prostate gland and seminal vesicles are incompletely imaged. Other: New pneumoperitoneum (moderate volume) noted. Trace volume of ascites. Diffuse mesenteric and retroperitoneal edema. Musculoskeletal: Diffuse body wall edema. There are no aggressive appearing lytic or blastic lesions noted in the visualized portions of the skeleton. IMPRESSION: 1. New pneumoperitoneum compared to the prior examination, compatible with perforated viscus. The exact source of this is uncertain, but this is suspected be related to gas traversing the chronic diverticular abscess wall at the site of the indwelling catheter. This was discussed by phone with Dr. Romana Juniper. 2. Diffuse body wall edema, mesenteric edema and retroperitoneal edema with bilateral pleural effusions, suspicious for a state of anasarca. 3. Multiple nonobstructive calculi in the collecting systems of both kidneys measuring up to 8 mm in the lower pole collecting system of the right kidney. There is also some new high attenuation material lying dependently in the large right bladder diverticulum. This could represent multiple tiny calculi, but this is unlikely given the development  compared to the recent prior examination. Rather, this is favored to represent some proteinaceous/hemorrhagic debris or residual contrast material from prior CT scan 11/08/2017. 4. Aortic atherosclerosis. 5. Additional findings, as above. Aortic Atherosclerosis (ICD10-I70.0). These results were called by telephone at the time of interpretation on 11/14/2017 at 3:35 pm to Dr. Kae Heller, who verbally acknowledged these results. Electronically Signed   By: Vinnie Langton M.D.   On: 11/14/2017 15:48   Ct Head Wo Contrast  Result Date: 11/14/2017 CLINICAL DATA:  82 y/o  M; altered mental status, rule out stroke. EXAM: CT HEAD WITHOUT CONTRAST TECHNIQUE: Contiguous axial images were obtained from the base of the skull through the vertex without intravenous contrast. COMPARISON:  None. FINDINGS: Brain: No evidence of acute infarction,  hemorrhage, hydrocephalus or mass lesion/mass effect. Mild chronic microvascular ischemic changes and parenchymal volume loss of the brain for age. 4 mm trace subdural collection in the right cerebral convexity with intermittent attenuation, probably subdural hematoma (series 10, image 39). No significant mass effect. Vascular: Calcific atherosclerosis of carotid siphons. No hyperdense vessel. Skull: Normal. Negative for fracture or focal lesion. Sinuses/Orbits: Mild ethmoid sinus mucosal thickening. Otherwise negative. Other: None. IMPRESSION: 1. Thin subdural collection over right cerebral convexity, likely subdural hematoma. No significant mass effect. 2. No evidence for stroke, brain parenchymal hemorrhage, or mass effect. 3. Mild chronic microvascular ischemic changes and parenchymal volume loss of the brain. Critical Value/emergent results were called by telephone at the time of interpretation on 11/14/2017 at 3:34 pm to Dr. Kae Heller, who verbally acknowledged these results. Electronically Signed   By: Kristine Garbe M.D.   On: 11/14/2017 15:31   US Renal  Result Date:  11/15/2017 CLINICAL DATA:  Acute renal injury, postop exploratory laparotomy. EXAM: RENAL / URINARY TRACT ULTRASOUND COMPLETE COMPARISON:  CT 11/14/2017. FINDINGS: Right Kidney: Length: 12.6 cm. Echogenicity within normal limits. There is an 8 mm calculus in the lower pole. No mass or hydronephrosis visualized. Left Kidney: Length: 11.8 cm. Echogenicity within normal limits. Nonobstructing 8 mm calculus in the left upper pole. No mass or hydronephrosis visualized. Bladder: Decompressed by Foley catheter. Other: Small amount of perihepatic fluid is seen. IMPRESSION: 1. Bilateral nephrolithiasis without obstructive uropathy. Calculi measure on the order of 8 mm each, one seen in the lower the right kidney and the second in the upper pole of left kidney. 2. No loss of cortical-medullary distinction of either kidney. 3. Foley decompressed urinary bladder. 4. Small amount of perihepatic free fluid. Electronically Signed   By: Ashley Royalty M.D.   On: 11/15/2017 00:20   Portable Chest Xray  Result Date: 11/14/2017 CLINICAL DATA:  Post op bowel resections EXAM: PORTABLE CHEST 1 VIEW COMPARISON:  11/14/2017 FINDINGS: Endotracheal tube tip is about 8.2 cm superior to carina. Esophageal tube tip is below the diaphragm. Left lower chest drainage catheter as before. Small pleural effusions. Mild cardiomegaly. No pneumothorax. Mild atelectasis or focus of infiltrate in the left mid lung. Left lung base consolidation. Left-sided central venous catheter tip overlies the brachiocephalic confluence. No pneumothorax. IMPRESSION: 1. Endotracheal tube tip about 8.2 cm superior to carina 2. Small left effusion with left lower lobe atelectasis or infiltrate, slight worsening. 3. Mild cardiomegaly Electronically Signed   By: Donavan Foil M.D.   On: 11/14/2017 23:03   Dg Chest Port 1 View  Result Date: 11/14/2017 CLINICAL DATA:  82 year old male with altered mental status. EXAM: PORTABLE CHEST 1 VIEW COMPARISON:  11/14/2017.  FINDINGS: Cardiomediastinal silhouette enlarged but stable. There is mild pulmonary vascular congestion without frank edema. No focal parenchymal consolidation. Likely small left pleural effusion and associated atelectasis, superimposed consolidation not excluded. No pneumothorax identified. Pigtail catheter overlies the left upper quadrant. No acute osseous abnormalities. IMPRESSION: Grossly stable appearance of the chest with small left basilar effusion/atelectasis, superimposed consolidation not excluded. Electronically Signed   By: Kristopher Oppenheim M.D.   On: 11/14/2017 13:39    STUDIES:  CT A/P 11/14/17: New pneumoperitoneum compared to the prior examination, compatible with perforated viscus. The exact source of this is uncertain, but this is suspected be related to gas traversing the chronic diverticular abscess wall at the site of the indwelling catheter. This was discussed by phone with Dr. Romana Juniper. Diffuse body wall edema, mesenteric edema and retroperitoneal  edema with bilateral pleural effusions, suspicious for a state of anasarca. Multiple nonobstructive calculi in the collecting systems of both kidneys measuring up to 8 mm in the lower pole collecting system of the right kidney. There is also some new high attenuation material lying dependently in the large right bladder diverticulum. This could represent multiple tiny calculi, but this is unlikely given the development compared to the recent prior examination. Rather, this is favored to represent some proteinaceous/hemorrhagic debris or residual contrast material from prior CT scan 11/08/2017.  CXR 11/25: stable cardiomediastinal silhouette, no pneumothorax. Mild left basilar atelectasis or infiltrate is noted with minimal left pleural effusion.  CT Head 11/25: Thin subdural collection over right cerebral convexity, likely subdural hematoma. No significant mass effect. No evidence for stroke, brain parenchymal hemorrhage, or mass  effect. Mild chronic microvascular ischemic changes and parenchymal volume loss of the brain.  CXR 11/25: ETT appropriate, slight worsening of small left effusion with LLL atelectasis or infiltrate.   US renal 11/25: Bilateral nephrolithiasis without obstructive uropathy. Calculi measure on the order of 8 mm each, one seen in the lower the right kidney and the second in the upper pole of left kidney. No loss of cortical-medullary distinction of either kidney. Foley decompressed urinary bladder. Small amount of perihepatic free fluid.  CULTURES: 11/23 pleural fluid: gram stain negative, culture NG x 2 days 11/25 BCx: NGTD   ANTIBIOTICS: Vanc 11/19 >>  Zosyn 11/19 >>   SIGNIFICANT EVENTS: 11/19 admitted with abdominal pain 11/23 left chest tube placed 11/25 AMS, CT with perf > OR   LINES/TUBES: PIV L wrist 11/19 >  PIV R forearm 11/25 >  Radial art line 11/25 >  CVC left subclavian 11/25 >  Chest tube 12 french 11/23 >  Foley 11/25 >  ETT 11/25 > NG 11/25 >   DISCUSSION: 82 year old male history of peptic ulcer disease, diverticulitis with abscess of the sigmoid colon, colocutaneous fistula with indwelling intraabdominal drain who presented with abdominal pain and draining from around his intra-abdominal drain site, found to have a loculated pleural effusion for which a left sided chest tube was placed. Progressively encephalopathic with CT showing concern for perforated viscus > OR for perforated viscus.   ASSESSMENT / PLAN:  PULMONARY A: Acute respiratory failure  Loculated L pleural effusion > consider sending basic pleural fluid studies ie LDH, protein, cytology given concern for possible intraabdominal malignancy P:   -continue vanc/zosyn -continue chest tube  CARDIOVASCULAR A:  New onset afib  P:  -continue to hold heparin -continue metoprolol -continue amiodarone  RENAL A:   AKI Renal calculi P:   -daily BMP  GASTROINTESTINAL A:   Perforated  diverticulitis s/p partial sigmoid colectomy with end colostomy Hx of diverticulitis with abscess of the sigmoid colon, colocutaneous fistula with indwelling intraabdominal drain, c/w possible intraabdominal malignancy P:   -surgery following -continue vanc/zosyn -monitor ostomy output  HEMATOLOGIC A:   Anemia in setting of SDH, baseline 9.0 Thrombocytosis, likely 2/2 acute infection P:  -daily CBC   INFECTIOUS A:   Sepsis 2/2 perforated viscus  Loculated L pleural effusion Anemia P:   -continue vanc/zosyn -follow cultures  NEUROLOGIC A:   New small SDH 11/25 P:   RASS goal: -1  -continue to hold heparin  FAMILY  - Updates: no family at bedside on my exam.  - Inter-disciplinary family meet or Palliative Care meeting due by:  12/2  Ralene Ok, MD PGY-2, [facility] 11/15/2017 7:13 AM

## 2017-11-15 NOTE — Progress Notes (Signed)
Patient was transported to CT & back to 2M11 without any complications.

## 2017-11-15 NOTE — Progress Notes (Signed)
1 Day Post-Op   Subjective/Chief Complaint: Intubated, minimally responsive   Objective: Vital signs in last 24 hours: Temp:  [97.3 F (36.3 C)-97.8 F (36.6 C)] 97.4 F (36.3 C) (11/26 0830) Pulse Rate:  [53-72] 53 (11/26 0900) Resp:  [14-23] 20 (11/26 0900) BP: (81-138)/(43-71) 87/44 (11/26 0900) SpO2:  [97 %-100 %] 97 % (11/26 0900) Arterial Line BP: (105-143)/(38-54) 115/39 (11/26 0900) FiO2 (%):  [30 %-40 %] 40 % (11/26 0800) Last BM Date: 11/13/17  Intake/Output from previous day: 11/25 0701 - 11/26 0700 In: 2495.7 [I.V.:1115.7; Blood:630; IV Piggyback:750] Out: 829 [Urine:235; Blood:100; Chest Tube:50] Intake/Output this shift: Total I/O In: 622.1 [I.V.:272.1; IV Piggyback:350] Out: 105 [Urine:105]  GI: wound clean, ostomy viable no function  Lab Results:  Recent Labs    11/14/17 1311  11/14/17 2047 11/15/17 0343  WBC 32.1*  --   --  19.5*  HGB 7.8*   < > 9.2* 8.8*  HCT 24.3*   < > 27.0* 26.5*  PLT 482*  --   --  399   < > = values in this interval not displayed.   BMET Recent Labs    11/14/17 1311  11/14/17 1947 11/14/17 2047 11/15/17 0343  NA 136   < > 140 140 136  K 2.9*   < > 3.8 3.8 3.6  CL 107  --   --   --  113*  CO2 22  --   --   --  17*  GLUCOSE 86  --  112*  --  159*  BUN 18  --   --   --  18  CREATININE 2.26*  --   --   --  2.21*  CALCIUM 7.1*  --   --   --  6.5*   < > = values in this interval not displayed.   PT/INR No results for input(s): LABPROT, INR in the last 72 hours. ABG Recent Labs    11/14/17 2047 11/14/17 2349  PHART 7.275* 7.512*  HCO3 20.3 16.8*    Studies/Results: Ct Abdomen Pelvis Wo Contrast  Result Date: 11/14/2017 CLINICAL DATA:  82 year old male presenting with signs and symptoms suspicious for potential bowel obstruction. EXAM: CT ABDOMEN AND PELVIS WITHOUT CONTRAST TECHNIQUE: Multidetector CT imaging of the abdomen and pelvis was performed following the standard protocol without IV contrast.  COMPARISON:  CT the abdomen and pelvis 11/08/2017. FINDINGS: Lower chest: Pigtail drainage catheter in the lower left hemithorax posteriorly. Small left-sided pleural effusion lying dependently. Trace right pleural effusion lying dependently. Areas of apparent subsegmental atelectasis in the lower lobes of lungs bilaterally. Hepatobiliary: Numerous tiny calcified granulomas. No other definite suspicious appearing hepatic lesions noted throughout the liver on today's noncontrast CT examination. Amorphous intermediate to high attenuation material lying dependently in the gallbladder, presumably biliary sludge. Pancreas: No definite pancreatic mass noted on today's noncontrast CT examination. Small amount of peripancreatic stranding noted, but nonspecific given generalize soft tissues trending throughout the retroperitoneum, mesenteric and diffuse body wall edema. Spleen: Unremarkable. Adrenals/Urinary Tract: Several nonobstructive calculi are noted within the collecting systems of both kidneys, measuring up to 8 mm in the lower pole collecting system of the right kidney. No ureteral stones. No hydroureteronephrosis. Bilateral adrenal glands are normal in appearance. Large right-sided bladder diverticulum with some high attenuation material lying dependently in this diverticulum, new compared to the prior examination from 11/08/2017, potentially residual contrast material or proteinaceous/hemorrhagic debris. Stomach/Bowel: Unenhanced appearance of the stomach is unremarkable. No pathologic dilatation of small bowel. Colon appears  moderately distended with multiple air-fluid levels. Previously noted diverticular abscess with indwelling pigtail drainage catheter in the distal descending colon (axial image 57 of series 3) remains completely decompressed. Soft tissue stranding adjacent to the decompressed abscess is noted, as well as adjacent locules of extraluminal gas (axial image 58 of series 3) which are new compared  to the prior study. The appendix is not confidently identified and may be surgically absent. Regardless, there are no inflammatory changes noted adjacent to the cecum to suggest the presence of an acute appendicitis at this time. Vascular/Lymphatic: Aortic atherosclerosis, without evidence of aneurysm in the abdominal or pelvic vasculature. No lymphadenopathy noted in the abdomen or pelvis on today's noncontrast CT examination. Reproductive: Prostate gland and seminal vesicles are incompletely imaged. Other: New pneumoperitoneum (moderate volume) noted. Trace volume of ascites. Diffuse mesenteric and retroperitoneal edema. Musculoskeletal: Diffuse body wall edema. There are no aggressive appearing lytic or blastic lesions noted in the visualized portions of the skeleton. IMPRESSION: 1. New pneumoperitoneum compared to the prior examination, compatible with perforated viscus. The exact source of this is uncertain, but this is suspected be related to gas traversing the chronic diverticular abscess wall at the site of the indwelling catheter. This was discussed by phone with Dr. Romana Juniper. 2. Diffuse body wall edema, mesenteric edema and retroperitoneal edema with bilateral pleural effusions, suspicious for a state of anasarca. 3. Multiple nonobstructive calculi in the collecting systems of both kidneys measuring up to 8 mm in the lower pole collecting system of the right kidney. There is also some new high attenuation material lying dependently in the large right bladder diverticulum. This could represent multiple tiny calculi, but this is unlikely given the development compared to the recent prior examination. Rather, this is favored to represent some proteinaceous/hemorrhagic debris or residual contrast material from prior CT scan 11/08/2017. 4. Aortic atherosclerosis. 5. Additional findings, as above. Aortic Atherosclerosis (ICD10-I70.0). These results were called by telephone at the time of interpretation on  11/14/2017 at 3:35 pm to Dr. Kae Heller, who verbally acknowledged these results. Electronically Signed   By: Vinnie Langton M.D.   On: 11/14/2017 15:48   Ct Head Wo Contrast  Result Date: 11/14/2017 CLINICAL DATA:  82 y/o  M; altered mental status, rule out stroke. EXAM: CT HEAD WITHOUT CONTRAST TECHNIQUE: Contiguous axial images were obtained from the base of the skull through the vertex without intravenous contrast. COMPARISON:  None. FINDINGS: Brain: No evidence of acute infarction, hemorrhage, hydrocephalus or mass lesion/mass effect. Mild chronic microvascular ischemic changes and parenchymal volume loss of the brain for age. 4 mm trace subdural collection in the right cerebral convexity with intermittent attenuation, probably subdural hematoma (series 10, image 39). No significant mass effect. Vascular: Calcific atherosclerosis of carotid siphons. No hyperdense vessel. Skull: Normal. Negative for fracture or focal lesion. Sinuses/Orbits: Mild ethmoid sinus mucosal thickening. Otherwise negative. Other: None. IMPRESSION: 1. Thin subdural collection over right cerebral convexity, likely subdural hematoma. No significant mass effect. 2. No evidence for stroke, brain parenchymal hemorrhage, or mass effect. 3. Mild chronic microvascular ischemic changes and parenchymal volume loss of the brain. Critical Value/emergent results were called by telephone at the time of interpretation on 11/14/2017 at 3:34 pm to Dr. Kae Heller, who verbally acknowledged these results. Electronically Signed   By: Kristine Garbe M.D.   On: 11/14/2017 15:31   US Renal  Result Date: 11/15/2017 CLINICAL DATA:  Acute renal injury, postop exploratory laparotomy. EXAM: RENAL / URINARY TRACT ULTRASOUND COMPLETE COMPARISON:  CT 11/14/2017.  FINDINGS: Right Kidney: Length: 12.6 cm. Echogenicity within normal limits. There is an 8 mm calculus in the lower pole. No mass or hydronephrosis visualized. Left Kidney: Length: 11.8 cm.  Echogenicity within normal limits. Nonobstructing 8 mm calculus in the left upper pole. No mass or hydronephrosis visualized. Bladder: Decompressed by Foley catheter. Other: Small amount of perihepatic fluid is seen. IMPRESSION: 1. Bilateral nephrolithiasis without obstructive uropathy. Calculi measure on the order of 8 mm each, one seen in the lower the right kidney and the second in the upper pole of left kidney. 2. No loss of cortical-medullary distinction of either kidney. 3. Foley decompressed urinary bladder. 4. Small amount of perihepatic free fluid. Electronically Signed   By: Ashley Royalty M.D.   On: 11/15/2017 00:20   Portable Chest Xray  Result Date: 11/14/2017 CLINICAL DATA:  Post op bowel resections EXAM: PORTABLE CHEST 1 VIEW COMPARISON:  11/14/2017 FINDINGS: Endotracheal tube tip is about 8.2 cm superior to carina. Esophageal tube tip is below the diaphragm. Left lower chest drainage catheter as before. Small pleural effusions. Mild cardiomegaly. No pneumothorax. Mild atelectasis or focus of infiltrate in the left mid lung. Left lung base consolidation. Left-sided central venous catheter tip overlies the brachiocephalic confluence. No pneumothorax. IMPRESSION: 1. Endotracheal tube tip about 8.2 cm superior to carina 2. Small left effusion with left lower lobe atelectasis or infiltrate, slight worsening. 3. Mild cardiomegaly Electronically Signed   By: Donavan Foil M.D.   On: 11/14/2017 23:03   Dg Chest Port 1 View  Result Date: 11/14/2017 CLINICAL DATA:  82 year old male with altered mental status. EXAM: PORTABLE CHEST 1 VIEW COMPARISON:  11/14/2017. FINDINGS: Cardiomediastinal silhouette enlarged but stable. There is mild pulmonary vascular congestion without frank edema. No focal parenchymal consolidation. Likely small left pleural effusion and associated atelectasis, superimposed consolidation not excluded. No pneumothorax identified. Pigtail catheter overlies the left upper quadrant. No  acute osseous abnormalities. IMPRESSION: Grossly stable appearance of the chest with small left basilar effusion/atelectasis, superimposed consolidation not excluded. Electronically Signed   By: Kristopher Oppenheim M.D.   On: 11/14/2017 13:39   Dg Chest Port 1 View  Result Date: 11/14/2017 CLINICAL DATA:  Left pleural effusion. EXAM: PORTABLE CHEST 1 VIEW COMPARISON:  Radiographs of November 08, 2017. FINDINGS: Stable cardiomediastinal silhouette. No pneumothorax is noted. Right lung is clear. Mild left basilar atelectasis or infiltrate is noted with possible minimal left pleural effusion. Bony thorax is unremarkable. IMPRESSION: Mild left basilar atelectasis or infiltrate is noted with minimal left pleural effusion. Electronically Signed   By: Marijo Conception, M.D.   On: 11/14/2017 07:30    Anti-infectives: Anti-infectives (From admission, onward)   Start     Dose/Rate Route Frequency Ordered Stop   11/15/17 0515  vancomycin (VANCOCIN) 1,250 mg in sodium chloride 0.9 % 250 mL IVPB     1,250 mg 166.7 mL/hr over 90 Minutes Intravenous Every 24 hours 11/15/17 0502     11/14/17 2315  vancomycin (VANCOCIN) IVPB 750 mg/150 ml premix  Status:  Discontinued     750 mg 150 mL/hr over 60 Minutes Intravenous Every 12 hours 11/14/17 2306 11/14/17 2309   11/13/17 1600  vancomycin (VANCOCIN) IVPB 750 mg/150 ml premix  Status:  Discontinued     750 mg 150 mL/hr over 60 Minutes Intravenous Every 12 hours 11/13/17 1057 11/14/17 2306   11/11/17 1600  vancomycin (VANCOCIN) IVPB 1000 mg/200 mL premix  Status:  Discontinued     1,000 mg 200 mL/hr over 60 Minutes Intravenous  Every 12 hours 11/11/17 0443 11/13/17 1057   11/09/17 1600  vancomycin (VANCOCIN) IVPB 750 mg/150 ml premix  Status:  Discontinued     750 mg 150 mL/hr over 60 Minutes Intravenous Every 12 hours 11/09/17 1500 11/11/17 0443   11/09/17 0800  vancomycin (VANCOCIN) IVPB 750 mg/150 ml premix  Status:  Discontinued     750 mg 150 mL/hr over 60  Minutes Intravenous Every 12 hours 11/08/17 2152 11/09/17 1500   11/09/17 0200  piperacillin-tazobactam (ZOSYN) IVPB 3.375 g     3.375 g 12.5 mL/hr over 240 Minutes Intravenous Every 8 hours 11/08/17 1821     11/08/17 2200  vancomycin (VANCOCIN) 2,000 mg in sodium chloride 0.9 % 500 mL IVPB     2,000 mg 250 mL/hr over 120 Minutes Intravenous  Once 11/08/17 2152 11/09/17 0020   11/08/17 1830  piperacillin-tazobactam (ZOSYN) IVPB 3.375 g     3.375 g 100 mL/hr over 30 Minutes Intravenous  Once 11/08/17 1821 11/08/17 2134   11/08/17 1745  piperacillin-tazobactam (ZOSYN) IVPB 3.375 g  Status:  Discontinued     3.375 g 100 mL/hr over 30 Minutes Intravenous  Once 11/08/17 1733 11/08/17 1734      Assessment/Plan: POD 1- sigmoid colectomy/end colectomy- Dr Kae Heller  1.  Dressing changes bid 2. Would hold tube feeds until tomorrow and then start if overall better 3. Heparin for afib- follow cbc 4. Will need 7-10 days iv abx for intraabdominal infection  Rolm Bookbinder 11/15/2017

## 2017-11-15 NOTE — Progress Notes (Signed)
Interim Progress Note:   Family meeting with daughter, Roberto Owen. She reports that Roberto Owen is legally divorced from his wife, and has one additional biological child, Roberto Owen, who lives in [place]. On admission, she reports that the patient told her and the admitting team to "do what's best" for him regarding code status. She is familiar with DNR, as she has dicussed this with her mother. Patient is very religious, and stated this weekend that "I told God I have more things to do here and he will provide the resources for me to get better if that is his will." I reviewed his current condition (sepsis 2/2 intraabdominal infection, pleural effusion, SDH, CHF with diffuse hypokinesis, new afib with RVR) and the severity of his illness with her. She understands that he is very ill and we cannot predict his recovery at this point, but that he may not return to his baseline after this illness. I explained full code vs DNR vs comfort care, and she would like to discuss these options with her family today and tomorrow and revisit code status at that point. She is also curious about the CT head results from today, which are pending. She understands that if he should worsen we would need to continue this conversation.    Roberto Ok, MD

## 2017-11-15 NOTE — Progress Notes (Signed)
Nutrition Follow-up  DOCUMENTATION CODES:   Severe malnutrition in context of chronic illness  INTERVENTION:   When able to start TF, recommend:  Vital AF 1.2 at 20 ml/h, when tolerance established, increase by 10 ml every 4 hours to goal rate of 70 ml/h (1680 ml per day) to provide 2016 kcal, 126 gm protein, 1362 ml free water daily  NUTRITION DIAGNOSIS:   Severe Malnutrition related to chronic illness(altered GI function) as evidenced by severe muscle depletion, severe fat depletion, percent weight loss(8% in 3 months).  Ongoing  GOAL:   Patient will meet greater than or equal to 90% of their needs  Unmet  MONITOR:   Diet advancement, Labs, Weight trends, I & O's  ASSESSMENT:   Pt with PMH of anemia, peptic ulcer disease, hx of GI bleed, diverticulosis presents with colonic obstruction and loculated pleural effusion possibly causing persistent hiccups.  S/P sigmoid colectomy and colostomy 11/25 for perforated viscus. Remains on vent support post-op. Potential for starting TF tomorrow if he improves overall. S/P family meeting with daughter today. She is discussing code status and overall goals of care with other family.  Patient is currently intubated on ventilator support MV: 12.7 L/min Temp (24hrs), Avg:97.6 F (36.4 C), Min:97.4 F (36.3 C), Max:98.1 F (36.7 C)  Propofol: 23.7 ml/hr providing 626 kcal from lipid. Labs reviewed. CBG's: [id number] Medications reviewed.   Diet Order:  Diet NPO time specified  EDUCATION NEEDS:   No education needs have been identified at this time  Skin:  Skin Assessment: Skin Integrity Issues: Skin Integrity Issues:: Incisions Incisions: abdomen  Last BM:  11/24  Height:   Ht Readings from Last 1 Encounters:  11/09/17 6\' 6"  (1.981 m)    Weight:   Wt Readings from Last 1 Encounters:  11/09/17 192 lb 10.9 oz (87.4 kg)    Ideal Body Weight:  97 kg  BMI:  Body mass index is 22.27 kg/m.  Estimated Nutritional  Needs:   Kcal:  2015  Protein:  120-140 gm  Fluid:  >/= 2 L/d   Molli Barrows, RD, LDN, [place] Pager [phone] After Hours Pager [phone]

## 2017-11-15 NOTE — Progress Notes (Signed)
Pharmacy Antibiotic Note  Roberto Owen is a 82 y.o. male admitted on 11/08/2017 with intra-abdominal infection.  Pharmacy has been consulted for Vancomycin dosing. Vancomycin was held last night due to increasing Scr (1.04>>1.38>>2.26) and a random vancomycin level was drawn this AM. The random level is 12. The last dose of vancomycin was about 24 hours ago. Better suited for q24h dosing at this point.   Plan: -Will re-start vancomycin at 1250 mg IV q24h -Trend WBC, temp  -Daily BMET -Re-check drug levels as indicated   Height: 6\' 6"  (198.1 cm) Weight: 192 lb 10.9 oz (87.4 kg) IBW/kg (Calculated) : 91.4  Temp (24hrs), Avg:97.7 F (36.5 C), Min:97.3 F (36.3 C), Max:98.3 F (36.8 C)  Recent Labs  Lab 11/08/17 1116 11/08/17 2139  11/11/17 0319 11/12/17 0356 11/13/17 0317 11/14/17 0428 11/14/17 1311 11/14/17 1531 11/15/17 0343  WBC  --   --    < > 10.9* 11.1* 9.1 5.3 32.1*  --  19.5*  CREATININE  --   --    < > 1.04 1.04 1.38* 1.91* 2.26*  --  2.21*  LATICACIDVEN 1.52 1.15  --   --   --   --   --  1.8 1.9  --   VANCOTROUGH  --   --   --  13*  --   --   --   --   --   --   VANCORANDOM  --   --   --   --   --   --   --   --   --  12   < > = values in this interval not displayed.    Estimated Creatinine Clearance: 33 mL/min (A) (by C-G formula based on SCr of 2.21 mg/dL (H)).    No Known Allergies   [name] 11/15/2017 4:45 AM

## 2017-11-16 ENCOUNTER — Inpatient Hospital Stay (HOSPITAL_COMMUNITY): Payer: Medicare Other

## 2017-11-16 LAB — PHOSPHORUS
PHOSPHORUS: 3.5 mg/dL (ref 2.5–4.6)
PHOSPHORUS: 3.2 mg/dL (ref 2.5–4.6)

## 2017-11-16 LAB — CBC
HCT: 24.4 % — ABNORMAL LOW (ref 39.0–52.0)
HEMOGLOBIN: 8.2 g/dL — AB (ref 13.0–17.0)
MCH: 24.8 pg — ABNORMAL LOW (ref 26.0–34.0)
MCHC: 33.6 g/dL (ref 30.0–36.0)
MCV: 73.9 fL — ABNORMAL LOW (ref 78.0–100.0)
Platelets: 326 10*3/uL (ref 150–400)
RBC: 3.3 MIL/uL — ABNORMAL LOW (ref 4.22–5.81)
RDW: 17.3 % — ABNORMAL HIGH (ref 11.5–15.5)
WBC: 23.8 10*3/uL — ABNORMAL HIGH (ref 4.0–10.5)

## 2017-11-16 LAB — BLOOD GAS, ARTERIAL
Acid-base deficit: 5.7 mmol/L — ABNORMAL HIGH (ref 0.0–2.0)
BICARBONATE: 17.5 mmol/L — AB (ref 20.0–28.0)
Drawn by: 347621
FIO2: 0.3
LHR: 16 {breaths}/min
O2 Saturation: 98.7 %
PATIENT TEMPERATURE: 98.2
PCO2 ART: 24.8 mmHg — AB (ref 32.0–48.0)
PEEP: 5 cmH2O
VT: 620 mL
pH, Arterial: 7.461 — ABNORMAL HIGH (ref 7.350–7.450)
pO2, Arterial: 136 mmHg — ABNORMAL HIGH (ref 83.0–108.0)

## 2017-11-16 LAB — GLUCOSE, CAPILLARY
GLUCOSE-CAPILLARY: 71 mg/dL (ref 65–99)
Glucose-Capillary: 106 mg/dL — ABNORMAL HIGH (ref 65–99)
Glucose-Capillary: 107 mg/dL — ABNORMAL HIGH (ref 65–99)
Glucose-Capillary: 61 mg/dL — ABNORMAL LOW (ref 65–99)
Glucose-Capillary: 69 mg/dL (ref 65–99)
Glucose-Capillary: 71 mg/dL (ref 65–99)
Glucose-Capillary: 73 mg/dL (ref 65–99)
Glucose-Capillary: 85 mg/dL (ref 65–99)

## 2017-11-16 LAB — MAGNESIUM
Magnesium: 1.9 mg/dL (ref 1.7–2.4)
Magnesium: 1.8 mg/dL (ref 1.7–2.4)

## 2017-11-16 LAB — BASIC METABOLIC PANEL
ANION GAP: 6 (ref 5–15)
BUN: 22 mg/dL — AB (ref 6–20)
CHLORIDE: 114 mmol/L — AB (ref 101–111)
CO2: 18 mmol/L — AB (ref 22–32)
CREATININE: 2.42 mg/dL — AB (ref 0.61–1.24)
Calcium: 6.6 mg/dL — ABNORMAL LOW (ref 8.9–10.3)
GFR calc non Af Amer: 24 mL/min — ABNORMAL LOW (ref >60)
GFR, EST AFRICAN AMERICAN: 27 mL/min — AB (ref >60)
Glucose, Bld: 115 mg/dL — ABNORMAL HIGH (ref 65–99)
Potassium: 3.1 mmol/L — ABNORMAL LOW (ref 3.5–5.1)
Sodium: 138 mmol/L (ref 135–145)

## 2017-11-16 LAB — TROPONIN I: Troponin I: <0.03 ng/mL (ref <0.03)

## 2017-11-16 LAB — VANCOMYCIN, RANDOM: Vancomycin Rm: 34

## 2017-11-16 MED ORDER — VITAL AF 1.2 CAL PO LIQD
1000.0000 mL | ORAL | Status: DC
  Administered 2017-11-16: 1000 mL

## 2017-11-16 MED ORDER — POTASSIUM CHLORIDE 20 MEQ/15ML (10%) PO SOLN
40.0000 meq | Freq: Two times a day (BID) | ORAL | Status: AC
  Administered 2017-11-16 (×2): 40 meq
  Filled 2017-11-16 (×3): qty 30

## 2017-11-16 MED ORDER — DEXTROSE 50 % IV SOLN
INTRAVENOUS | Status: AC
  Administered 2017-11-16: 25 mL via INTRAVENOUS
  Filled 2017-11-16: qty 50

## 2017-11-16 MED ORDER — VITAL AF 1.2 CAL PO LIQD: 1000.0000 mL | ORAL | Status: DC

## 2017-11-16 MED ORDER — DEXTROSE 50 % IV SOLN
25.0000 mL | Freq: Once | INTRAVENOUS | Status: AC
  Administered 2017-11-16: 25 mL via INTRAVENOUS

## 2017-11-16 MED ORDER — MUPIROCIN 2 % EX OINT
1.0000 "application " | TOPICAL_OINTMENT | Freq: Two times a day (BID) | CUTANEOUS | Status: AC
  Administered 2017-11-16 – 2017-11-20 (×10): 1 via NASAL
  Filled 2017-11-16 (×3): qty 22

## 2017-11-16 MED ORDER — DEXTROSE 50 % IV SOLN
INTRAVENOUS | Status: AC
  Administered 2017-11-16: 25 mL
  Filled 2017-11-16: qty 50

## 2017-11-16 NOTE — Procedures (Signed)
Pt transported to MRI on vent, no complications

## 2017-11-16 NOTE — Progress Notes (Signed)
CBG 69. Following standing hypoglycemic orders, gave D50 42ml. CBG taken around 15 minutes later was 71. There were no signs or symptoms of hypoglycemia. Values reported to CCM.

## 2017-11-16 NOTE — Progress Notes (Signed)
PT Cancellation Note  Patient Details Name: Roberto Owen MRN: [id number] DOB: [birth year]   Cancelled Treatment:    Reason Eval/Treat Not Completed: Patient not medically ready   Duncan Dull 11/16/2017, 1:28 PM Alben Deeds, PT DPT  Board Certified Neurologic Specialist [phone]

## 2017-11-16 NOTE — Progress Notes (Signed)
Pharmacy Antibiotic Note  Roberto Owen is a 82 y.o. male admitted on 11/08/2017 with intra-abdominal infection.  Pharmacy has been consulted for Vancomycin dosing. Vancomycin was re-started 11/26 after a vancomycin random of 12 was drawn. Now vancomycin random this AM is elevated at 34 and renal function appears to be worsening (2.21>>2.42).   Plan: -Hold vancomycin for now -Re-check vancomycin random 11/28 AM at 0500  Height: 6\' 6"  (198.1 cm) Weight: 192 lb 10.9 oz (87.4 kg) IBW/kg (Calculated) : 91.4  Temp (24hrs), Avg:97.6 F (36.4 C), Min:97.4 F (36.3 C), Max:98.1 F (36.7 C)  Recent Labs  Lab 11/11/17 0319  11/13/17 0317 11/14/17 0428 11/14/17 1311 11/14/17 1531 11/15/17 0343 11/15/17 1540 11/16/17 0351  WBC 10.9*   < > 9.1 5.3 32.1*  --  19.5*  --  23.8*  CREATININE 1.04   < > 1.38* 1.91* 2.26*  --  2.21*  --  2.42*  LATICACIDVEN  --   --   --   --  1.8 1.9  --   --   --   VANCOTROUGH 13*  --   --   --   --   --   --  37*  --   VANCORANDOM  --   --   --   --   --   --  12  --  34   < > = values in this interval not displayed.    Estimated Creatinine Clearance: 30.1 mL/min (A) (by C-G formula based on SCr of 2.42 mg/dL (H)).    No Known Allergies   [name] 11/16/2017 5:46 AM

## 2017-11-16 NOTE — Progress Notes (Signed)
PULMONARY / CRITICAL CARE MEDICINE   Name: Roberto Owen MRN: [id number] DOB: 1935/10/02    ADMISSION DATE:  11/08/2017 CONSULTATION DATE:  11/14/17  REFERRING MD:  FPTS  CHIEF COMPLAINT:  confusion  HISTORY OF PRESENT ILLNESS:   82 year old male history of peptic ulcer disease, diverticulitis with abscess of the sigmoid colon, colocutaneous fistula with indwelling intraabdominal drain who presented with abdominal pain and draining from around his intra-abdominal drain site.  CT abdomen pelvis on 11/19 showed no residual abscess but circumferential colonic wall thickening.  He was myosin and Zosyn planned artmann this week after being seen by surgery.  He was also noted to have a loculated pleural effusion for which interventional radiology placed a left-sided pigtail catheter on 11/23.  Overnight he had worsening abdominal pain and this morning he was found to be more confused.  He was noted to have an episode of bradycardia with heart rate in the 20s and became unresponsive, spO2 70's.  Chest compressions were initiated however after a single compression he awoke and reportedly pushed the team away.   Subsequent CT abdomen pelvis showed concern for perforated viscus and so he was taken to the operating room for exploratory laparotomy.  PAST MEDICAL HISTORY :  He  has a past medical history of Anemia, Bladder diverticulum (11/10/2017), Esophageal ulcer, Gastric ulcer, GI bleed, History of blood transfusion (07/2017), and NSAID induced gastritis.  PAST SURGICAL HISTORY: He  has a past surgical history that includes Esophagogastroduodenoscopy (egd) with propofol (N/A, 07/25/2017); Colonoscopy (~ 05/2016); IR Radiologist Eval & Mgmt (09/30/2017); IR Catheter Tube Change (10/28/2017); IR Radiologist Eval & Mgmt (10/19/2017); and laparotomy (N/A, 11/14/2017).  No Known Allergies  No current facility-administered medications on file prior to encounter.    Current Outpatient Medications on File  Prior to Encounter  Medication Sig  . acetaminophen (TYLENOL) 500 MG tablet Take 1,000 mg by mouth every 6 (six) hours as needed for mild pain.  . ferrous sulfate 325 (65 FE) MG tablet Take 1 tablet (325 mg total) by mouth daily with breakfast.  . Multiple Vitamins-Minerals (ADULT ONE DAILY GUMMIES) CHEW Chew 1 tablet by mouth daily.   . pantoprazole (PROTONIX) 40 MG tablet TAKE 1 TABLET BY MOUTH TWICE DAILY  . Simethicone 180 MG CAPS Take 1 capsule every 4 (four) hours as needed by mouth (for gas, bloating).  . Sod Bicarb-Ginger-Fennel-Cham (GRIPE WATER) LIQD Take 30 mLs every 4 (four) hours as needed by mouth (for gas).   . traMADol (ULTRAM) 50 MG tablet Take 1 tablet (50 mg total) by mouth every 6 (six) hours as needed for moderate pain or severe pain. (Patient not taking: Reported on 10/23/2017)    FAMILY HISTORY:  His indicated that the status of his mother is unknown. He indicated that the status of his father is unknown. He indicated that the status of his neg hx is unknown.   SOCIAL HISTORY: He  reports that  has never smoked. he has never used smokeless tobacco. He reports that he does not drink alcohol or use drugs.  REVIEW OF SYSTEMS:   Unable to provide 2/2 sedation.   SUBJECTIVE:  Patient intubated and sedated.   VITAL SIGNS: BP (!) 103/51   Pulse (!) 54   Temp 97.9 F (36.6 C) (Oral)   Resp 16   Ht 6\' 6"  (1.981 m)   Wt 192 lb 10.9 oz (87.4 kg)   SpO2 100%   BMI 22.27 kg/m   HEMODYNAMICS:    VENTILATOR SETTINGS: Vent Mode:  PRVC FiO2 (%):  [30 %-40 %] 30 % Set Rate:  [16 bmp-20 bmp] 16 bmp Vt Set:  [620 mL] 620 mL PEEP:  [5 cmH20] 5 cmH20 Plateau Pressure:  [13 cmH20-21 cmH20] 18 cmH20  INTAKE / OUTPUT: I/O last 3 completed shifts: In: 4495.8 [I.V.:3045.8; Blood:630; NG/GT:120; IV RPRXYVOPF:292] Out: 755 [Urine:525; Drains:80; Other:50; Blood:100]  PHYSICAL EXAMINATION: General:  Ill appearing male, lying in bed sedated.  Neuro:  No distress. Follows  commands and able to move R hand and L leg.  HEENT:  Poor dentition, ETT and OG in place, MMM Cardiovascular:  Irregular rhythm, no murmur Lungs:  Coarse breath sounds bilaterally, decreased over left base Abdomen:  Soft, nondistended, ostomy pink with output, dressings C/D/I  Skin:  No rashes   LABS:  BMET Recent Labs  Lab 11/14/17 1311  11/14/17 1947 11/14/17 2047 11/15/17 0343 11/16/17 0351  NA 136   < > 140 140 136 138  K 2.9*   < > 3.8 3.8 3.6 3.1*  CL 107  --   --   --  113* 114*  CO2 22  --   --   --  17* 18*  BUN 18  --   --   --  18 22*  CREATININE 2.26*  --   --   --  2.21* 2.42*  GLUCOSE 86  --  112*  --  159* 115*   < > = values in this interval not displayed.    Electrolytes Recent Labs  Lab 11/14/17 1311 11/15/17 0343 11/16/17 0351  CALCIUM 7.1* 6.5* 6.6*    CBC Recent Labs  Lab 11/14/17 1311  11/14/17 2047 11/15/17 0343 11/16/17 0351  WBC 32.1*  --   --  19.5* 23.8*  HGB 7.8*   < > 9.2* 8.8* 8.2*  HCT 24.3*   < > 27.0* 26.5* 24.4*  PLT 482*  --   --  399 326   < > = values in this interval not displayed.    Coag's No results for input(s): APTT, INR in the last 168 hours.  Sepsis Markers Recent Labs  Lab 11/14/17 1311 11/14/17 1531  LATICACIDVEN 1.8 1.9    ABG Recent Labs  Lab 11/14/17 2349 11/15/17 1201 11/16/17 0535  PHART 7.512* 7.475* 7.461*  PCO2ART 21.1* 21.4* 24.8*  PO2ART 119* 163.0* 136*    Liver Enzymes Recent Labs  Lab 11/14/17 1311  AST 21  ALT 11*  ALKPHOS 61  BILITOT 0.7  ALBUMIN 1.2*    Cardiac Enzymes Recent Labs  Lab 11/14/17 1311  TROPONINI <0.03    Glucose Recent Labs  Lab 11/15/17 0829 11/15/17 1141 11/15/17 1546 11/15/17 2044 11/15/17 2357 11/16/17 0419  GLUCAP 133* 116* 105* 108* 113* 107*    Imaging Ct Head Wo Contrast  Result Date: 11/15/2017 CLINICAL DATA:  82 year old male post abdominal surgery yesterday. Altered level of consciousness. Not waking up. Insert sub EXAM: CT  HEAD WITHOUT CONTRAST TECHNIQUE: Contiguous axial images were obtained from the base of the skull through the vertex without intravenous contrast. COMPARISON:  11/14/2017 CT. FINDINGS: Brain: Right convexity 3.9 mm subdural hematoma without change. No significant associated mass effect. No new intracranial hemorrhage seen separate from the above described finding. No CT evidence of large acute infarct or diffuse anoxia. Atrophy. No intracranial mass lesion noted on this unenhanced exam. Vascular: Vascular calcifications Skull: No acute abnormality Sinuses/Orbits: No acute orbital abnormality. Minimal mucosal thickening ethmoid sinus air cells. Other: Mastoid air cells and middle ear cavities are  clear. IMPRESSION: Right convexity 3.9 mm subdural hematoma without change. No significant associated mass effect. No new intracranial hemorrhage. No CT evidence of large acute infarct or diffuse anoxia. Atrophy. Electronically Signed   By: Genia Del M.D.   On: 11/15/2017 14:04    STUDIES:  CT A/P 11/14/17: New pneumoperitoneum compared to the prior examination, compatible with perforated viscus. The exact source of this is uncertain, but this is suspected be related to gas traversing the chronic diverticular abscess wall at the site of the indwelling catheter. This was discussed by phone with Dr. Romana Juniper. Diffuse body wall edema, mesenteric edema and retroperitoneal edema with bilateral pleural effusions, suspicious for a state of anasarca. Multiple nonobstructive calculi in the collecting systems of both kidneys measuring up to 8 mm in the lower pole collecting system of the right kidney. There is also some new high attenuation material lying dependently in the large right bladder diverticulum. This could represent multiple tiny calculi, but this is unlikely given the development compared to the recent prior examination. Rather, this is favored to represent some proteinaceous/hemorrhagic debris or  residual contrast material from prior CT scan 11/08/2017.  CXR 11/25: stable cardiomediastinal silhouette, no pneumothorax. Mild left basilar atelectasis or infiltrate is noted with minimal left pleural effusion.  CT Head 11/25: Thin subdural collection over right cerebral convexity, likely subdural hematoma. No significant mass effect. No evidence for stroke, brain parenchymal hemorrhage, or mass effect. Mild chronic microvascular ischemic changes and parenchymal volume loss of the brain.  CXR 11/25: ETT appropriate, slight worsening of small left effusion with LLL atelectasis or infiltrate.   US renal 11/25: Bilateral nephrolithiasis without obstructive uropathy. Calculi measure on the order of 8 mm each, one seen in the lower the right kidney and the second in the upper pole of left kidney. No loss of cortical-medullary distinction of either kidney. Foley decompressed urinary bladder. Small amount of perihepatic free fluid.  CT head 11/26: SDH unchanged, no imaging findings to suggest evolving large ischemia.   CULTURES: 11/23 pleural fluid: gram stain negative, culture NG x 2 days 11/25 BCx: NGTD   ANTIBIOTICS: Vanc 11/19 >>  Zosyn 11/19 >>   SIGNIFICANT EVENTS: 11/19 admitted with abdominal pain 11/23 left chest tube placed 11/25 AMS, CT with perf > OR   LINES/TUBES: PIV L wrist 11/19 >  PIV R forearm 11/25 >  Radial art line 11/25 >  CVC left subclavian 11/25 >  Chest tube 12 french 11/23 >  Foley 11/25 >  ETT 11/25 > NG 11/25 >   DISCUSSION: 82 year old male history of peptic ulcer disease, diverticulitis with abscess of the sigmoid colon, colocutaneous fistula with indwelling intraabdominal drain who presented with abdominal pain and draining from around his intra-abdominal drain site, found to have a loculated pleural effusion for which a left sided chest tube was placed. Progressively encephalopathic with CT showing concern for perforated viscus > OR for perforated  viscus.   ASSESSMENT / PLAN:  NEUROLOGIC A:   New small SDH 11/25, unchanged on CT head 11/26  P:   RASS goal: -1  -continue to hold heparin -consider MRI to evaluate possible CVA with inability to move R leg   PULMONARY A: Acute respiratory failure  Loculated L pleural effusion > consider sending basic pleural fluid studies ie LDH, protein, cytology given concern for possible intraabdominal malignancy P:   -continue vanc/zosyn -continue chest tube  CARDIOVASCULAR A:  New onset afib  Levo to maintain MAP >65 (not yet required) P:  -  continue to hold heparin -continue amiodarone -continue levophed for MAP >65  RENAL A:   AKI Renal calculi UOP low at 230mL x last 24 hours P:   -daily BMP -consider renal consult for oliguria and increasing Cr   GASTROINTESTINAL A:   Perforated diverticulitis s/p partial sigmoid colectomy with end colostomy Hx of diverticulitis with abscess of the sigmoid colon, colocutaneous fistula with indwelling intraabdominal drain, c/w possible intraabdominal malignancy P:   -surgery following -continue vanc/zosyn -monitor ostomy output  HEMATOLOGIC A:   Anemia in setting of SDH, baseline 9.0 P:  -daily CBC   INFECTIOUS A:   Sepsis 2/2 perforated viscus  Loculated L pleural effusion Leukocytosis increasing, possibly post surgical P:   -continue vanc/zosyn -follow cultures  FAMILY  - Updates: no family at bedside on my exam - Inter-disciplinary family meet or Palliative Care meeting due by:  12/2  Ralene Ok, MD PGY-2, [facility] 11/16/2017 7:16 AM

## 2017-11-16 NOTE — Progress Notes (Signed)
[facility]/Trauma Progress Note  2 Days Post-Op   Assessment/Plan Principal Problem:   Colonic obstruction (HCC) Active Problems:   Microcytic anemia   Anemia due to GI blood loss   Hyponatremia   Diverticulitis of large intestine with abscess   AKI (acute kidney injury) ([place])   Diverticulitis of intestine with abscess   Diverticulitis   Atrial fibrillation (HCC)   Loculated pleural effusion on Left, Possible   Hypoalbuminemia due to protein-calorie malnutrition (HCC)   Thrombocytosis (HCC)   Euthyroid sick syndrome   Hematuria, microscopic   Colonic fistula, History of, by fistulogram   Hiccups   Severe protein-calorie malnutrition (HCC)   Dehydration  Perforated Diverticulitis - S/P Exploratory laparotomy, partial sigmoid colectomy with end colostomy, Dr. Kae Heller, 11/25 - pt having bowel function - can start tube feeds at half rate utilizing OG tube  FEN: TF at 1/2 rate VTE: SCD's, okay to start an anticoagulant from a surgical standpoint but heparin held due to SDH ID: [email or web address] 11/19>> Follow up: Dr. Kae Heller  DISPO: okay to start TF at half rate, dietitian consult    LOS: 8 days    Subjective: CC: S/P hartman's  Pt is on vent but awake and alert. He denies abdominal pain.   Objective: Vital signs in last 24 hours: Temp:  [97.4 F (36.3 C)-98.1 F (36.7 C)] 97.7 F (36.5 C) (11/27 0829) Pulse Rate:  [47-65] 59 (11/27 0827) Resp:  [0-21] 16 (11/27 0827) BP: (81-155)/(35-60) 155/60 (11/27 0827) SpO2:  [86 %-100 %] 95 % (11/27 0827) Arterial Line BP: (96-140)/(29-57) 135/52 (11/27 0800) FiO2 (%):  [30 %-40 %] 30 % (11/27 0827) Last BM Date: 11/16/17  Intake/Output from previous day: 11/26 0701 - 11/27 0700 In: 2500.1 [I.V.:1930.1; NG/GT:120; IV Piggyback:450] Out: 370 [Urine:290; Drains:80] Intake/Output this shift: No intake/output data recorded.  PE: Gen:  Alert, NAD Card:  Mildly bradycardic, no M/G/R heard Pulm:  On vent, L sided  CT Abd: Soft, not distented, +BS, nontender, gas and liquid stool in colostomy, midline incision without purulent drainage, packed with wet to dry dressing,  Skin: warm and dry   Anti-infectives: Anti-infectives (From admission, onward)   Start     Dose/Rate Route Frequency Ordered Stop   11/15/17 0515  vancomycin (VANCOCIN) 1,250 mg in sodium chloride 0.9 % 250 mL IVPB  Status:  Discontinued     1,250 mg 166.7 mL/hr over 90 Minutes Intravenous Every 24 hours 11/15/17 0502 11/15/17 1746   11/14/17 2315  vancomycin (VANCOCIN) IVPB 750 mg/150 ml premix  Status:  Discontinued     750 mg 150 mL/hr over 60 Minutes Intravenous Every 12 hours 11/14/17 2306 11/14/17 2309   11/13/17 1600  vancomycin (VANCOCIN) IVPB 750 mg/150 ml premix  Status:  Discontinued     750 mg 150 mL/hr over 60 Minutes Intravenous Every 12 hours 11/13/17 1057 11/14/17 2306   11/11/17 1600  vancomycin (VANCOCIN) IVPB 1000 mg/200 mL premix  Status:  Discontinued     1,000 mg 200 mL/hr over 60 Minutes Intravenous Every 12 hours 11/11/17 0443 11/13/17 1057   11/09/17 1600  vancomycin (VANCOCIN) IVPB 750 mg/150 ml premix  Status:  Discontinued     750 mg 150 mL/hr over 60 Minutes Intravenous Every 12 hours 11/09/17 1500 11/11/17 0443   11/09/17 0800  vancomycin (VANCOCIN) IVPB 750 mg/150 ml premix  Status:  Discontinued     750 mg 150 mL/hr over 60 Minutes Intravenous Every 12 hours 11/08/17 2152 11/09/17 1500   11/09/17 0200  piperacillin-tazobactam (ZOSYN) IVPB 3.375 g     3.375 g 12.5 mL/hr over 240 Minutes Intravenous Every 8 hours 11/08/17 1821     11/08/17 2200  vancomycin (VANCOCIN) 2,000 mg in sodium chloride 0.9 % 500 mL IVPB     2,000 mg 250 mL/hr over 120 Minutes Intravenous  Once 11/08/17 2152 11/09/17 0020   11/08/17 1830  piperacillin-tazobactam (ZOSYN) IVPB 3.375 g     3.375 g 100 mL/hr over 30 Minutes Intravenous  Once 11/08/17 1821 11/08/17 2134   11/08/17 1745  piperacillin-tazobactam (ZOSYN) IVPB  3.375 g  Status:  Discontinued     3.375 g 100 mL/hr over 30 Minutes Intravenous  Once 11/08/17 1733 11/08/17 1734      Lab Results:  Recent Labs    11/15/17 0343 11/16/17 0351  WBC 19.5* 23.8*  HGB 8.8* 8.2*  HCT 26.5* 24.4*  PLT 399 326   BMET Recent Labs    11/15/17 0343 11/16/17 0351  NA 136 138  K 3.6 3.1*  CL 113* 114*  CO2 17* 18*  GLUCOSE 159* 115*  BUN 18 22*  CREATININE 2.21* 2.42*  CALCIUM 6.5* 6.6*   PT/INR No results for input(s): LABPROT, INR in the last 72 hours. CMP     Component Value Date/Time   NA 138 11/16/2017 0351   K 3.1 (L) 11/16/2017 0351   CL 114 (H) 11/16/2017 0351   CO2 18 (L) 11/16/2017 0351   GLUCOSE 115 (H) 11/16/2017 0351   BUN 22 (H) 11/16/2017 0351   CREATININE 2.42 (H) 11/16/2017 0351   CALCIUM 6.6 (L) 11/16/2017 0351   PROT 5.7 (L) 11/14/2017 1311   ALBUMIN 1.2 (L) 11/14/2017 1311   AST 21 11/14/2017 1311   ALT 11 (L) 11/14/2017 1311   ALKPHOS 61 11/14/2017 1311   BILITOT 0.7 11/14/2017 1311   GFRNONAA 24 (L) 11/16/2017 0351   GFRAA 27 (L) 11/16/2017 0351   Lipase     Component Value Date/Time   LIPASE 19 11/08/2017 1041    Studies/Results: Ct Abdomen Pelvis Wo Contrast  Result Date: 11/14/2017 CLINICAL DATA:  82 year old male presenting with signs and symptoms suspicious for potential bowel obstruction. EXAM: CT ABDOMEN AND PELVIS WITHOUT CONTRAST TECHNIQUE: Multidetector CT imaging of the abdomen and pelvis was performed following the standard protocol without IV contrast. COMPARISON:  CT the abdomen and pelvis 11/08/2017. FINDINGS: Lower chest: Pigtail drainage catheter in the lower left hemithorax posteriorly. Small left-sided pleural effusion lying dependently. Trace right pleural effusion lying dependently. Areas of apparent subsegmental atelectasis in the lower lobes of lungs bilaterally. Hepatobiliary: Numerous tiny calcified granulomas. No other definite suspicious appearing hepatic lesions noted throughout the  liver on today's noncontrast CT examination. Amorphous intermediate to high attenuation material lying dependently in the gallbladder, presumably biliary sludge. Pancreas: No definite pancreatic mass noted on today's noncontrast CT examination. Small amount of peripancreatic stranding noted, but nonspecific given generalize soft tissues trending throughout the retroperitoneum, mesenteric and diffuse body wall edema. Spleen: Unremarkable. Adrenals/Urinary Tract: Several nonobstructive calculi are noted within the collecting systems of both kidneys, measuring up to 8 mm in the lower pole collecting system of the right kidney. No ureteral stones. No hydroureteronephrosis. Bilateral adrenal glands are normal in appearance. Large right-sided bladder diverticulum with some high attenuation material lying dependently in this diverticulum, new compared to the prior examination from 11/08/2017, potentially residual contrast material or proteinaceous/hemorrhagic debris. Stomach/Bowel: Unenhanced appearance of the stomach is unremarkable. No pathologic dilatation of small bowel. Colon appears moderately distended with  multiple air-fluid levels. Previously noted diverticular abscess with indwelling pigtail drainage catheter in the distal descending colon (axial image 57 of series 3) remains completely decompressed. Soft tissue stranding adjacent to the decompressed abscess is noted, as well as adjacent locules of extraluminal gas (axial image 58 of series 3) which are new compared to the prior study. The appendix is not confidently identified and may be surgically absent. Regardless, there are no inflammatory changes noted adjacent to the cecum to suggest the presence of an acute appendicitis at this time. Vascular/Lymphatic: Aortic atherosclerosis, without evidence of aneurysm in the abdominal or pelvic vasculature. No lymphadenopathy noted in the abdomen or pelvis on today's noncontrast CT examination. Reproductive: Prostate  gland and seminal vesicles are incompletely imaged. Other: New pneumoperitoneum (moderate volume) noted. Trace volume of ascites. Diffuse mesenteric and retroperitoneal edema. Musculoskeletal: Diffuse body wall edema. There are no aggressive appearing lytic or blastic lesions noted in the visualized portions of the skeleton. IMPRESSION: 1. New pneumoperitoneum compared to the prior examination, compatible with perforated viscus. The exact source of this is uncertain, but this is suspected be related to gas traversing the chronic diverticular abscess wall at the site of the indwelling catheter. This was discussed by phone with Dr. Romana Juniper. 2. Diffuse body wall edema, mesenteric edema and retroperitoneal edema with bilateral pleural effusions, suspicious for a state of anasarca. 3. Multiple nonobstructive calculi in the collecting systems of both kidneys measuring up to 8 mm in the lower pole collecting system of the right kidney. There is also some new high attenuation material lying dependently in the large right bladder diverticulum. This could represent multiple tiny calculi, but this is unlikely given the development compared to the recent prior examination. Rather, this is favored to represent some proteinaceous/hemorrhagic debris or residual contrast material from prior CT scan 11/08/2017. 4. Aortic atherosclerosis. 5. Additional findings, as above. Aortic Atherosclerosis (ICD10-I70.0). These results were called by telephone at the time of interpretation on 11/14/2017 at 3:35 pm to Dr. Kae Heller, who verbally acknowledged these results. Electronically Signed   By: Vinnie Langton M.D.   On: 11/14/2017 15:48   Ct Head Wo Contrast  Result Date: 11/15/2017 CLINICAL DATA:  82 year old male post abdominal surgery yesterday. Altered level of consciousness. Not waking up. Insert sub EXAM: CT HEAD WITHOUT CONTRAST TECHNIQUE: Contiguous axial images were obtained from the base of the skull through the vertex  without intravenous contrast. COMPARISON:  11/14/2017 CT. FINDINGS: Brain: Right convexity 3.9 mm subdural hematoma without change. No significant associated mass effect. No new intracranial hemorrhage seen separate from the above described finding. No CT evidence of large acute infarct or diffuse anoxia. Atrophy. No intracranial mass lesion noted on this unenhanced exam. Vascular: Vascular calcifications Skull: No acute abnormality Sinuses/Orbits: No acute orbital abnormality. Minimal mucosal thickening ethmoid sinus air cells. Other: Mastoid air cells and middle ear cavities are clear. IMPRESSION: Right convexity 3.9 mm subdural hematoma without change. No significant associated mass effect. No new intracranial hemorrhage. No CT evidence of large acute infarct or diffuse anoxia. Atrophy. Electronically Signed   By: Genia Del M.D.   On: 11/15/2017 14:04   Ct Head Wo Contrast  Result Date: 11/14/2017 CLINICAL DATA:  82 y/o  M; altered mental status, rule out stroke. EXAM: CT HEAD WITHOUT CONTRAST TECHNIQUE: Contiguous axial images were obtained from the base of the skull through the vertex without intravenous contrast. COMPARISON:  None. FINDINGS: Brain: No evidence of acute infarction, hemorrhage, hydrocephalus or mass lesion/mass effect. Mild chronic  microvascular ischemic changes and parenchymal volume loss of the brain for age. 4 mm trace subdural collection in the right cerebral convexity with intermittent attenuation, probably subdural hematoma (series 10, image 39). No significant mass effect. Vascular: Calcific atherosclerosis of carotid siphons. No hyperdense vessel. Skull: Normal. Negative for fracture or focal lesion. Sinuses/Orbits: Mild ethmoid sinus mucosal thickening. Otherwise negative. Other: None. IMPRESSION: 1. Thin subdural collection over right cerebral convexity, likely subdural hematoma. No significant mass effect. 2. No evidence for stroke, brain parenchymal hemorrhage, or mass  effect. 3. Mild chronic microvascular ischemic changes and parenchymal volume loss of the brain. Critical Value/emergent results were called by telephone at the time of interpretation on 11/14/2017 at 3:34 pm to Dr. Kae Heller, who verbally acknowledged these results. Electronically Signed   By: Kristine Garbe M.D.   On: 11/14/2017 15:31   US Renal  Result Date: 11/15/2017 CLINICAL DATA:  Acute renal injury, postop exploratory laparotomy. EXAM: RENAL / URINARY TRACT ULTRASOUND COMPLETE COMPARISON:  CT 11/14/2017. FINDINGS: Right Kidney: Length: 12.6 cm. Echogenicity within normal limits. There is an 8 mm calculus in the lower pole. No mass or hydronephrosis visualized. Left Kidney: Length: 11.8 cm. Echogenicity within normal limits. Nonobstructing 8 mm calculus in the left upper pole. No mass or hydronephrosis visualized. Bladder: Decompressed by Foley catheter. Other: Small amount of perihepatic fluid is seen. IMPRESSION: 1. Bilateral nephrolithiasis without obstructive uropathy. Calculi measure on the order of 8 mm each, one seen in the lower the right kidney and the second in the upper pole of left kidney. 2. No loss of cortical-medullary distinction of either kidney. 3. Foley decompressed urinary bladder. 4. Small amount of perihepatic free fluid. Electronically Signed   By: Ashley Royalty M.D.   On: 11/15/2017 00:20   Portable Chest Xray  Result Date: 11/14/2017 CLINICAL DATA:  Post op bowel resections EXAM: PORTABLE CHEST 1 VIEW COMPARISON:  11/14/2017 FINDINGS: Endotracheal tube tip is about 8.2 cm superior to carina. Esophageal tube tip is below the diaphragm. Left lower chest drainage catheter as before. Small pleural effusions. Mild cardiomegaly. No pneumothorax. Mild atelectasis or focus of infiltrate in the left mid lung. Left lung base consolidation. Left-sided central venous catheter tip overlies the brachiocephalic confluence. No pneumothorax. IMPRESSION: 1. Endotracheal tube tip about  8.2 cm superior to carina 2. Small left effusion with left lower lobe atelectasis or infiltrate, slight worsening. 3. Mild cardiomegaly Electronically Signed   By: Donavan Foil M.D.   On: 11/14/2017 23:03   Dg Chest Port 1 View  Result Date: 11/14/2017 CLINICAL DATA:  82 year old male with altered mental status. EXAM: PORTABLE CHEST 1 VIEW COMPARISON:  11/14/2017. FINDINGS: Cardiomediastinal silhouette enlarged but stable. There is mild pulmonary vascular congestion without frank edema. No focal parenchymal consolidation. Likely small left pleural effusion and associated atelectasis, superimposed consolidation not excluded. No pneumothorax identified. Pigtail catheter overlies the left upper quadrant. No acute osseous abnormalities. IMPRESSION: Grossly stable appearance of the chest with small left basilar effusion/atelectasis, superimposed consolidation not excluded. Electronically Signed   By: Kristopher Oppenheim M.D.   On: 11/14/2017 13:39      Kalman Drape , [facility] 11/16/2017, 8:56 AM Pager: [phone] Consults: [phone] Mon-Fri 7:00 am-4:30 pm Sat-Sun 7:00 am-11:30 am

## 2017-11-16 NOTE — Progress Notes (Addendum)
Nutrition Follow-up  DOCUMENTATION CODES:   Severe malnutrition in context of chronic illness  INTERVENTION:    Vital AF 1.2 at 30 ml/h, do not advance rate at this time  Pro-stat 30 ml once daily  Increase as tolerated to goal rate of 60 ml/h (1440 ml per day) to provide 1828 kcal (2037 kcal total with Propofol), 123 gm protein, 1168 ml free water daily  NUTRITION DIAGNOSIS:   Severe Malnutrition related to chronic illness(altered GI function) as evidenced by severe muscle depletion, severe fat depletion, percent weight loss(8% in 3 months).  Ongoing  GOAL:   Patient will meet greater than or equal to 90% of their needs  Unmet  MONITOR:   Vent status, TF tolerance, Labs, I & O's  ASSESSMENT:   Pt with PMH of anemia, peptic ulcer disease, hx of GI bleed, diverticulosis presents with colonic obstruction and loculated pleural effusion possibly causing persistent hiccups.  S/P sigmoid colectomy and colostomy 11/25 for perforated viscus. Received MD Consult for TF initiation and management. Plans to start TF at half rate today. Remains intubated on ventilator support MV: 12.7 L/min Temp (24hrs), Avg:97.7 F (36.5 C), Min:97.4 F (36.3 C), Max:98 F (36.7 C)  Propofol: 7.9 ml/hr providing 209 kcal from lipid. Labs reviewed. CBG's: 107-106-71 Medications reviewed.   Diet Order:  Diet NPO time specified  EDUCATION NEEDS:   No education needs have been identified at this time  Skin:  Skin Assessment: Skin Integrity Issues: Skin Integrity Issues:: Incisions Incisions: abdomen  Last BM:  11/27 (colostomy)  Height:   Ht Readings from Last 1 Encounters:  11/09/17 6\' 6"  (1.981 m)    Weight:   Wt Readings from Last 1 Encounters:  11/09/17 192 lb 10.9 oz (87.4 kg)    Ideal Body Weight:  97 kg  BMI:  Body mass index is 22.27 kg/m.  Estimated Nutritional Needs:   Kcal:  2015  Protein:  120-140 gm  Fluid:  >/= 2 L/d   Molli Barrows, RD, LDN,  [place] Pager [phone] After Hours Pager [phone]

## 2017-11-16 NOTE — Progress Notes (Signed)
   11/16/17 2100  Clinical Encounter Type  Visited With Family  Visit Type Spiritual support  Referral From Family  Consult/Referral To [place] was asked to pray for the PT by the family member.  Chaplain prayed at the request of the family

## 2017-11-17 ENCOUNTER — Inpatient Hospital Stay (HOSPITAL_COMMUNITY): Payer: Medicare Other

## 2017-11-17 LAB — GLUCOSE, CAPILLARY
GLUCOSE-CAPILLARY: 76 mg/dL (ref 65–99)
GLUCOSE-CAPILLARY: 98 mg/dL (ref 65–99)
Glucose-Capillary: 89 mg/dL (ref 65–99)
Glucose-Capillary: 96 mg/dL (ref 65–99)
Glucose-Capillary: 98 mg/dL (ref 65–99)

## 2017-11-17 LAB — CBC
HEMATOCRIT: 25.6 % — AB (ref 39.0–52.0)
HEMOGLOBIN: 8.6 g/dL — AB (ref 13.0–17.0)
MCH: 25.3 pg — AB (ref 26.0–34.0)
MCHC: 33.6 g/dL (ref 30.0–36.0)
MCV: 75.3 fL — ABNORMAL LOW (ref 78.0–100.0)
Platelets: 294 10*3/uL (ref 150–400)
RBC: 3.4 MIL/uL — AB (ref 4.22–5.81)
RDW: 17.8 % — ABNORMAL HIGH (ref 11.5–15.5)
WBC: 21.2 10*3/uL — ABNORMAL HIGH (ref 4.0–10.5)

## 2017-11-17 LAB — BASIC METABOLIC PANEL
Anion gap: 5 (ref 5–15)
BUN: 23 mg/dL — AB (ref 6–20)
CALCIUM: 7.1 mg/dL — AB (ref 8.9–10.3)
CO2: 19 mmol/L — ABNORMAL LOW (ref 22–32)
CREATININE: 2.45 mg/dL — AB (ref 0.61–1.24)
Chloride: 117 mmol/L — ABNORMAL HIGH (ref 101–111)
GFR calc Af Amer: 27 mL/min — ABNORMAL LOW (ref >60)
GFR, EST NON AFRICAN AMERICAN: 23 mL/min — AB (ref >60)
GLUCOSE: 124 mg/dL — AB (ref 65–99)
Potassium: 3.5 mmol/L (ref 3.5–5.1)
SODIUM: 141 mmol/L (ref 135–145)

## 2017-11-17 LAB — CULTURE, BODY FLUID W GRAM STAIN -BOTTLE

## 2017-11-17 LAB — CULTURE, BODY FLUID-BOTTLE: CULTURE: NO GROWTH

## 2017-11-17 LAB — MAGNESIUM
MAGNESIUM: 1.8 mg/dL (ref 1.7–2.4)
Magnesium: 1.8 mg/dL (ref 1.7–2.4)

## 2017-11-17 LAB — VANCOMYCIN, RANDOM: VANCOMYCIN RM: 25

## 2017-11-17 LAB — PHOSPHORUS
Phosphorus: 2.8 mg/dL (ref 2.5–4.6)
Phosphorus: 3 mg/dL (ref 2.5–4.6)

## 2017-11-17 MED ORDER — VANCOMYCIN HCL IN DEXTROSE 1-5 GM/200ML-% IV SOLN
1000.0000 mg | INTRAVENOUS | Status: AC
  Administered 2017-11-18 – 2017-11-22 (×3): 1000 mg via INTRAVENOUS
  Filled 2017-11-17 (×4): qty 200

## 2017-11-17 MED ORDER — WHITE PETROLATUM EX OINT
TOPICAL_OINTMENT | CUTANEOUS | Status: DC | PRN
  Administered 2017-11-17 (×2): via TOPICAL
  Filled 2017-11-17: qty 28.35

## 2017-11-17 MED ORDER — VITAL AF 1.2 CAL PO LIQD
1000.0000 mL | ORAL | Status: DC
  Administered 2017-11-17 – 2017-11-18 (×2): 1000 mL
  Filled 2017-11-17 (×2): qty 1000

## 2017-11-17 NOTE — Procedures (Signed)
Extubation Procedure Note  Patient Details:   Name: Roberto Owen DOB: 11-08-1935 MRN: [id number]   Airway Documentation:     Evaluation  O2 sats: stable throughout Complications: No apparent complications Patient did tolerate procedure well. Bilateral Breath Sounds: Rhonchi, Diminished   Yes   Pt extubated to 3L [place] per MD order. Pt stable throughout with no complications. VS within normal limits. Pt able to speak and has a strong non-productive cough post extubation.Clear/diminished BS bilaterally. Pt encouraged to use [place] to clear secretions.   Laymond Purser M 11/17/2017, 10:15 AM

## 2017-11-17 NOTE — Progress Notes (Signed)
PULMONARY / CRITICAL CARE MEDICINE   Name: Roberto Owen MRN: [id number] DOB: September 21, 1935    ADMISSION DATE:  11/08/2017 CONSULTATION DATE:  11/14/17  REFERRING MD:  FPTS  CHIEF COMPLAINT:  confusion  HISTORY OF PRESENT ILLNESS:   82 year old male history of peptic ulcer disease, diverticulitis with abscess of the sigmoid colon, colocutaneous fistula with indwelling intraabdominal drain who presented with abdominal pain and draining from around his intra-abdominal drain site.  CT abdomen pelvis on 11/19 showed no residual abscess but circumferential colonic wall thickening.  He was myosin and Zosyn planned artmann this week after being seen by surgery.  He was also noted to have a loculated pleural effusion for which interventional radiology placed a left-sided pigtail catheter on 11/23.  Overnight he had worsening abdominal pain and this morning he was found to be more confused.  He was noted to have an episode of bradycardia with heart rate in the 20s and became unresponsive, spO2 70's.  Chest compressions were initiated however after a single compression he awoke and reportedly pushed the team away.   Subsequent CT abdomen pelvis showed concern for perforated viscus and so he was taken to the operating room for exploratory laparotomy.  PAST MEDICAL HISTORY :  He  has a past medical history of Anemia, Bladder diverticulum (11/10/2017), Esophageal ulcer, Gastric ulcer, GI bleed, History of blood transfusion (07/2017), and NSAID induced gastritis.  PAST SURGICAL HISTORY: He  has a past surgical history that includes Esophagogastroduodenoscopy (egd) with propofol (N/A, 07/25/2017); Colonoscopy (~ 05/2016); IR Radiologist Eval & Mgmt (09/30/2017); IR Catheter Tube Change (10/28/2017); IR Radiologist Eval & Mgmt (10/19/2017); and laparotomy (N/A, 11/14/2017).  No Known Allergies  No current facility-administered medications on file prior to encounter.    Current Outpatient Medications on File  Prior to Encounter  Medication Sig  . acetaminophen (TYLENOL) 500 MG tablet Take 1,000 mg by mouth every 6 (six) hours as needed for mild pain.  . ferrous sulfate 325 (65 FE) MG tablet Take 1 tablet (325 mg total) by mouth daily with breakfast.  . Multiple Vitamins-Minerals (ADULT ONE DAILY GUMMIES) CHEW Chew 1 tablet by mouth daily.   . pantoprazole (PROTONIX) 40 MG tablet TAKE 1 TABLET BY MOUTH TWICE DAILY  . Simethicone 180 MG CAPS Take 1 capsule every 4 (four) hours as needed by mouth (for gas, bloating).  . Sod Bicarb-Ginger-Fennel-Cham (GRIPE WATER) LIQD Take 30 mLs every 4 (four) hours as needed by mouth (for gas).   . traMADol (ULTRAM) 50 MG tablet Take 1 tablet (50 mg total) by mouth every 6 (six) hours as needed for moderate pain or severe pain. (Patient not taking: Reported on 10/23/2017)    FAMILY HISTORY:  His indicated that the status of his mother is unknown. He indicated that the status of his father is unknown. He indicated that the status of his neg hx is unknown.   SOCIAL HISTORY: He  reports that  has never smoked. he has never used smokeless tobacco. He reports that he does not drink alcohol or use drugs.  REVIEW OF SYSTEMS:   Unable to provide 2/2 sedation.   SUBJECTIVE:  Patient intubated and sedated.   VITAL SIGNS: BP 137/70   Pulse (!) 52   Temp 98 F (36.7 C) (Oral)   Resp 16   Ht 6\' 6"  (1.981 m)   Wt 203 lb 7.8 oz (92.3 kg)   SpO2 100%   BMI 23.51 kg/m   HEMODYNAMICS:    VENTILATOR SETTINGS: Vent Mode: PRVC  FiO2 (%):  [30 %] 30 % Set Rate:  [16 bmp] 16 bmp Vt Set:  [620 mL] 620 mL PEEP:  [5 cmH20] 5 cmH20 Pressure Support:  [5 cmH20-10 cmH20] 5 cmH20 Plateau Pressure:  [18 cmH20-22 cmH20] 18 cmH20  INTAKE / OUTPUT: I/O last 3 completed shifts: In: 4578.6 [I.V.:3889.6; NG/GT:589; IV Piggyback:100] Out: 1540 [Urine:835; Drains:260; Stool:400; Chest Tube:45]  PHYSICAL EXAMINATION: General:  Ill appearing male, lying in bed sedated.  Neuro:   No distress. Follows commands and able to move all extremities.  HEENT:  Poor dentition, ETT and OG in place, MMM Cardiovascular:  Irregular rhythm, no murmur Lungs:  Coarse breath sounds bilaterally, decreased over left base Abdomen:  Soft, nondistended, ostomy pink with output, dressings C/D/I  Skin:  No rashes   LABS:  BMET Recent Labs  Lab 11/14/17 1311  11/14/17 1947 11/14/17 2047 11/15/17 0343 11/16/17 0351  NA 136   < > 140 140 136 138  K 2.9*   < > 3.8 3.8 3.6 3.1*  CL 107  --   --   --  113* 114*  CO2 22  --   --   --  17* 18*  BUN 18  --   --   --  18 22*  CREATININE 2.26*  --   --   --  2.21* 2.42*  GLUCOSE 86  --  112*  --  159* 115*   < > = values in this interval not displayed.    Electrolytes Recent Labs  Lab 11/14/17 1311 11/15/17 0343 11/16/17 0351 11/16/17 1513 11/16/17 1851 11/17/17 0447  CALCIUM 7.1* 6.5* 6.6*  --   --   --   MG  --   --   --  1.9 1.8 1.8  PHOS  --   --   --  3.5 3.2 2.8    CBC Recent Labs  Lab 11/15/17 0343 11/16/17 0351 11/17/17 0447  WBC 19.5* 23.8* 21.2*  HGB 8.8* 8.2* 8.6*  HCT 26.5* 24.4* 25.6*  PLT 399 326 294    Coag's No results for input(s): APTT, INR in the last 168 hours.  Sepsis Markers Recent Labs  Lab 11/14/17 1311 11/14/17 1531  LATICACIDVEN 1.8 1.9    ABG Recent Labs  Lab 11/14/17 2349 11/15/17 1201 11/16/17 0535  PHART 7.512* 7.475* 7.461*  PCO2ART 21.1* 21.4* 24.8*  PO2ART 119* 163.0* 136*    Liver Enzymes Recent Labs  Lab 11/14/17 1311  AST 21  ALT 11*  ALKPHOS 61  BILITOT 0.7  ALBUMIN 1.2*    Cardiac Enzymes Recent Labs  Lab 11/14/17 1311 11/16/17 1851  TROPONINI <0.03 <0.03    Glucose Recent Labs  Lab 11/16/17 1611 11/16/17 1641 11/16/17 2014 11/16/17 2045 11/16/17 2356 11/17/17 0432  GLUCAP 61* 73 69 71 85 76    Imaging Mr Brain Wo Contrast  Result Date: 11/16/2017 CLINICAL DATA:  82 y/o M; asymmetry limb movement abnormality of uncertain etiology.  History of subdural hematoma. EXAM: MRI HEAD WITHOUT CONTRAST TECHNIQUE: Multiplanar, multiecho pulse sequences of the brain and surrounding structures were obtained without intravenous contrast. COMPARISON:  11/15/2017 CT head. FINDINGS: Brain: No reduced diffusion to suggest acute or early subacute infarction. Intermediate T1 and high T2 signal acute 3-4 mm subdural hematoma over right cerebral convexity is stable. No significant mass effect. No new intracranial hemorrhage. Mild chronic microvascular ischemic changes of white matter and parenchymal volume loss of the brain. 4 mm right lateral ventricle nodule along the under belly of corpus  callosum with increased T2 and intermediate T1 signal (series 11, image 17 and series 10, image 17). Vascular: Normal flow voids. Skull and upper cervical spine: Normal marrow signal. Sinuses/Orbits: Negative. Other: None. IMPRESSION: 1. No new acute intracranial abnormality. 2. Stable 3-4 mm right cerebral convexity subdural hematoma. 3. Mild chronic microvascular ischemic changes and mild parenchymal volume loss of the brain. 4. 4 mm right lateral ventricle nodule on under belly of corpus callosum, likely benign. Comparison with prior MRI imaging where available is recommended. Otherwise if clinically indicated consider 6-12 month follow-up to evaluate for stability. Electronically Signed   By: Kristine Garbe M.D.   On: 11/16/2017 14:53    STUDIES:  CT A/P 11/14/17: New pneumoperitoneum compared to the prior examination, compatible with perforated viscus. The exact source of this is uncertain, but this is suspected be related to gas traversing the chronic diverticular abscess wall at the site of the indwelling catheter. This was discussed by phone with Dr. Romana Juniper. Diffuse body wall edema, mesenteric edema and retroperitoneal edema with bilateral pleural effusions, suspicious for a state of anasarca. Multiple nonobstructive calculi in the collecting  systems of both kidneys measuring up to 8 mm in the lower pole collecting system of the right kidney. There is also some new high attenuation material lying dependently in the large right bladder diverticulum. This could represent multiple tiny calculi, but this is unlikely given the development compared to the recent prior examination. Rather, this is favored to represent some proteinaceous/hemorrhagic debris or residual contrast material from prior CT scan 11/08/2017.  CXR 11/25: stable cardiomediastinal silhouette, no pneumothorax. Mild left basilar atelectasis or infiltrate is noted with minimal left pleural effusion.  CT Head 11/25: Thin subdural collection over right cerebral convexity, likely subdural hematoma. No significant mass effect. No evidence for stroke, brain parenchymal hemorrhage, or mass effect. Mild chronic microvascular ischemic changes and parenchymal volume loss of the brain.  CXR 11/25: ETT appropriate, slight worsening of small left effusion with LLL atelectasis or infiltrate.   US renal 11/25: Bilateral nephrolithiasis without obstructive uropathy. Calculi measure on the order of 8 mm each, one seen in the lower the right kidney and the second in the upper pole of left kidney. No loss of cortical-medullary distinction of either kidney. Foley decompressed urinary bladder. Small amount of perihepatic free fluid.  CT head 11/26: SDH unchanged, no imaging findings to suggest evolving large ischemia.   MRI 11/27: NAICA, stable 3-4 mm right cerebral convexity subdural hematoma. 4 mm right lateral ventricle nodule on under belly of corpus callosum, likely benign. Comparison with prior MRI imaging where available is recommended. Otherwise if clinically indicated consider 6-12 month follow-up to evaluate for stability.  CULTURES: 11/23 pleural fluid: gram stain negative, culture NG x 2 days 11/25 BCx: NGTD   ANTIBIOTICS: Vanc 11/19 >>  Zosyn 11/19 >>   SIGNIFICANT  EVENTS: 11/19 admitted with abdominal pain 11/23 left chest tube placed 11/25 AMS, CT with perf > OR   LINES/TUBES: PIV L wrist 11/19 >  PIV R forearm 11/25 >  Radial art line 11/25 >  CVC left subclavian 11/25 >  Chest tube 12 french 11/23 >  Foley 11/25 >  ETT 11/25 > NG 11/25 >   DISCUSSION: 82 year old male history of peptic ulcer disease, diverticulitis with abscess of the sigmoid colon, colocutaneous fistula with indwelling intraabdominal drain who presented with abdominal pain and draining from around his intra-abdominal drain site, found to have a loculated pleural effusion for which a left  sided chest tube was placed. Progressively encephalopathic with CT showing concern for perforated viscus > OR for perforated viscus.   ASSESSMENT / PLAN:  NEUROLOGIC A:   New small SDH 11/25, unchanged on CT head 11/26  MRI without evidence of acute stroke, no neuro deficits on exam 11/28 P:   RASS goal: -1  -continue to hold heparin  PULMONARY A: Acute respiratory failure  Loculated L pleural effusion  P:   -continue vanc/zosyn -continue chest tube until able to wean ventilator -wean vent today, possible extubation if doing well  CARDIOVASCULAR A:  New onset afib  Levo to maintain MAP >65 (not yet required) P:  -continue to hold heparin -continue amiodarone  RENAL A:   AKI, stabilizing Renal calculi UOP slightly improved at 72mL x last 24 hours P:   -daily BMP  GASTROINTESTINAL A:   Perforated diverticulitis s/p partial sigmoid colectomy with end colostomy Hx of diverticulitis with abscess of the sigmoid colon, colocutaneous fistula with indwelling intraabdominal drain, c/w possible intraabdominal malignancy P:   -surgery following: 1/2 goal TFs -continue vanc/zosyn -monitor ostomy output (481mL last 24 hours)  HEMATOLOGIC A:   Anemia in setting of SDH, baseline 9.0 P:  -daily CBC   INFECTIOUS A:   Sepsis 2/2 perforated viscus  Loculated L pleural  effusion P:   -continue vanc/zosyn -follow cultures  FAMILY  - Updates: no family at bedside on my exam - Inter-disciplinary family meet or Palliative Care meeting due by:  12/2  Ralene Ok, MD PGY-2, [facility] 11/17/2017 7:01 AM

## 2017-11-17 NOTE — Progress Notes (Addendum)
Nutrition Follow-up  DOCUMENTATION CODES:   Severe malnutrition in context of chronic illness  INTERVENTION:    Increase Vital AF 1.2 to new goal rate of 70 ml/h (1680 ml per day) to provide 2016 kcal, 126 gm protein, 1362 ml free water daily  NUTRITION DIAGNOSIS:   Severe Malnutrition related to chronic illness(altered GI function) as evidenced by severe muscle depletion, severe fat depletion, percent weight loss(8% in 3 months).  Ongoing  GOAL:   Patient will meet greater than or equal to 90% of their needs  Unmet  MONITOR:   Vent status, TF tolerance, Labs, I & O's  ASSESSMENT:   Pt with PMH of anemia, peptic ulcer disease, hx of GI bleed, diverticulosis presents with colonic obstruction and loculated pleural effusion possibly causing persistent hiccups.  S/P sigmoid colectomy and colostomy 11/25 for perforated viscus. Per surgery note, okay to increase TF to goal rate as patient is tolerating TF at half goal rate well. Remains intubated on ventilator support MV: 10.5 L/min Temp (24hrs), Avg:97.8 F (36.6 C), Min:97 F (36.1 C), Max:98.4 F (36.9 C)  Propofol: off Labs reviewed. CBG's: [id number] Medications reviewed.   Diet Order:  Diet NPO time specified  EDUCATION NEEDS:   No education needs have been identified at this time  Skin:  Skin Assessment: Skin Integrity Issues: Skin Integrity Issues:: Incisions Incisions: abdomen  Last BM:  11/28 (colostomy)  Height:   Ht Readings from Last 1 Encounters:  11/09/17 6\' 6"  (1.981 m)    Weight:   Wt Readings from Last 1 Encounters:  11/17/17 203 lb 7.8 oz (92.3 kg)    Ideal Body Weight:  97 kg  BMI:  Body mass index is 23.51 kg/m.  Estimated Nutritional Needs:   Kcal:  2015  Protein:  120-140 gm  Fluid:  >/= 2 L/d   Molli Barrows, RD, LDN, [place] Pager [phone] After Hours Pager [phone]

## 2017-11-17 NOTE — Progress Notes (Signed)
Physical Therapy Treatment Patient Details Name: Roberto Owen MRN: [id number] DOB: [birth year] Today's Date: 11/17/2017    History of Present Illness Pt is an 82 year old male with a known peptic ulcer disease, history of GI bleed, diverticulosis who presents with draining around indwelling catheter secondary to diverticulitis with abscess who was brought to the ED with increasing weight loss, GI spasms, cough plus leaking around his diverticular catheter site. Pt now s/p ex lap with partial sigmoid colectomy on 11/25. Of note, pt was intubated from 11/25-11/28.    PT Comments    Pt seen for re-evaluation following exploratory lap with colostomy placement as well as wound VAC application and chest tube.   Pt functionally more limited this session as compared to evaluation on 11/23. He currently requires max to total A x2 for bed mobility. Pt also with greatly decreased tolerance to activity and endurance. Pt on 3L of O2 via [place] after being extubated this AM. Pt tolerated sitting EOB ~20 minutes with close min guard to mod A to maintain upright position. Pt's SPO2 fluctuating throughout between 80% and 93% (?waveform).   Pt would continue to benefit from skilled physical therapy services at this time while admitted and after d/c to address the below listed limitations in order to improve overall safety and independence with functional mobility.    Follow Up Recommendations  SNF;Supervision/Assistance - 24 hour     Equipment Recommendations  Other (comment)(TBD with further mobility assessment)    Recommendations for Other Services       Precautions / Restrictions Precautions Precautions: Fall Precaution Comments: NG tube, L UQ colostomy, chest tube, abdominal wound VAC Restrictions Weight Bearing Restrictions: No    Mobility  Bed Mobility Overal bed mobility: Needs Assistance Bed Mobility: Supine to Sit;Sit to Supine     Supine to sit: Max assist;+2 for physical  assistance;HOB elevated Sit to supine: Total assist;+2 for physical assistance   General bed mobility comments: increased time and effort, assist with bilateral LE movement off of bed, assist to elevate trunk, total A to return to supine.  Transfers                 General transfer comment: focused on sitting balance and overall endurance with light activity as pt was extubated this AM with SPO2 fluctuating between 80% and 93%  Ambulation/Gait                 Stairs            Wheelchair Mobility    Modified Rankin (Stroke Patients Only)       Balance Overall balance assessment: Needs assistance Sitting-balance support: Feet supported Sitting balance-Leahy Scale: Poor Sitting balance - Comments: cueing for pt to maintain upright sitting balance and to lift head; pt unable to achieve a midline head position Postural control: Posterior lean                                  Cognition Arousal/Alertness: Awake/alert Behavior During Therapy: WFL for tasks assessed/performed Overall Cognitive Status: Within Functional Limits for tasks assessed                                 General Comments: pt very chatty this session but stating that he was very motivated and "I will not quit"      Exercises General Exercises - Lower Extremity  Ankle Circles/Pumps: AROM;Both;10 reps;Supine Long Arc Quad: AROM;Strengthening;Both;5 reps;Seated    General Comments        Pertinent Vitals/Pain Pain Assessment: Faces Faces Pain Scale: Hurts little more Pain Location: generalized Pain Descriptors / Indicators: Sore Pain Intervention(s): Monitored during session;Repositioned    Home Living                      Prior Function            PT Goals (current goals can now be found in the care plan section) Acute Rehab PT Goals PT Goal Formulation: With patient Time For Goal Achievement: 11/19/17 Potential to Achieve Goals:  Good Progress towards PT goals: Progressing toward goals    Frequency    Min 3X/week      PT Plan Discharge plan needs to be updated    Co-evaluation              AM-PAC PT "6 Clicks" Daily Activity  Outcome Measure  Difficulty turning over in bed (including adjusting bedclothes, sheets and blankets)?: Unable Difficulty moving from lying on back to sitting on the side of the bed? : Unable Difficulty sitting down on and standing up from a chair with arms (e.g., wheelchair, bedside commode, etc,.)?: Unable Help needed moving to and from a bed to chair (including a wheelchair)?: A Lot Help needed walking in hospital room?: A Lot Help needed climbing 3-5 steps with a railing? : Total 6 Click Score: 8    End of Session Equipment Utilized During Treatment: Oxygen Activity Tolerance: Patient limited by fatigue;Patient limited by pain Patient left: in bed;with call bell/phone within reach;with bed alarm set;with family/visitor present Nurse Communication: Mobility status PT Visit Diagnosis: Unsteadiness on feet (R26.81);Muscle weakness (generalized) (M62.81)     Time: 4174-0814 PT Time Calculation (min) (ACUTE ONLY): 38 min  Charges:  $Therapeutic Activity: 23-37 mins                    G Codes:       [place], [place], [place] [phone]    [place] 11/17/2017, 3:55 PM

## 2017-11-17 NOTE — Progress Notes (Signed)
Pharmacy Antibiotic Note  Roberto Owen is a 82 y.o. male admitted on 11/08/2017 with intra-abdominal infection.  Pharmacy has been consulted for Vancomycin and Zosyn dosing.  Assessment: Patient's WBC count is trending down and remains afebrile. Cultures are still NGTD. Patient's SCr remains stable and some urine output yesterday (0.49mL/kg/hr). Patient's vancomycin random this morning remains elevated at 25.   Plan: Continue holding vancomycin Re-start vancomycin at 1g IV Q48h on 11/29 @1800  (based on kinetics) Continue Zosyn 3.375g IV Q8H  LOT of 14 days per PCCM, through 12/3 Monitor WBC, Temp, Cx, SCr, UOP, drug levels as needed  Height: 6\' 6"  (198.1 cm) Weight: 203 lb 7.8 oz (92.3 kg) IBW/kg (Calculated) : 91.4  Temp (24hrs), Avg:97.9 F (36.6 C), Min:97 F (36.1 C), Max:98.4 F (36.9 C)  Recent Labs  Lab 11/11/17 0319  11/14/17 0428 11/14/17 1311 11/14/17 1531  11/15/17 0343 11/15/17 1540 11/16/17 0351 11/17/17 0447  WBC 10.9*   < > 5.3 32.1*  --   --  19.5*  --  23.8* 21.2*  CREATININE 1.04   < > 1.91* 2.26*  --   --  2.21*  --  2.42* 2.45*  LATICACIDVEN  --   --   --  1.8 1.9  --   --   --   --   --   VANCOTROUGH 13*  --   --   --   --   --   --  37*  --   --   VANCORANDOM  --   --   --   --   --    < > 12  --  34 25   < > = values in this interval not displayed.    Estimated Creatinine Clearance: 31.1 mL/min (A) (by C-G formula based on SCr of 2.45 mg/dL (H)).    No Known Allergies  Antimicrobials this admission: 11/20 Zosyn >>  11/20 Vanc >>   Dose adjustments this admission: 11/26 Vanc trough: 37 >> Hold >> 11/27 Vanc random: 34 >> Hold >> 11/28 Vanc random: 25 >> Hold  Microbiology results: 11/25 BCx: NGTD 2 days 11/23 Pleural Cx: NGTD 4 days 11/26 MRSA PCR: Positive  Thank you for allowing pharmacy to be a part of this patient's care.  Geraldo Pitter 11/17/2017 10:42 AM

## 2017-11-17 NOTE — Progress Notes (Signed)
[facility]/Trauma Progress Note  3 Days Post-Op   Assessment/Plan Principal Problem:   Colonic obstruction (HCC) Active Problems:   Microcytic anemia   Anemia due to GI blood loss   Hyponatremia   Diverticulitis of large intestine with abscess   AKI (acute kidney injury) ([place])   Diverticulitis of intestine with abscess   Diverticulitis   Atrial fibrillation (HCC)   Loculated pleural effusion on Left, Possible   Hypoalbuminemia due to protein-calorie malnutrition (HCC)   Thrombocytosis (HCC)   Euthyroid sick syndrome   Hematuria, microscopic   Colonic fistula, History of, by fistulogram   Hiccups   Severe protein-calorie malnutrition (HCC)   Dehydration  Perforated Diverticulitis - S/P Exploratory laparotomy, partial sigmoid colectomy with end colostomy, Dr. Kae Heller, 11/25 - pt having bowel function - can advance tube feeds to goal as tolerated  FEN: TF advance to goal as tolerated VTE: SCD's, okay to start an anticoagulant from a surgical standpoint but heparin held due to SDH ID: WBC trending down slowing 21.1 today, Zosyn 11/19>> Follow up: Dr. Kae Heller  DISPO: advance TF as tolerated, wound vac placement today    LOS: 9 days    Subjective: CC: S/P hartman's  Pt denies abdominal pain. On vent. Alert.   Objective: Vital signs in last 24 hours: Temp:  [97 F (36.1 C)-98.4 F (36.9 C)] 98 F (36.7 C) (11/28 0433) Pulse Rate:  [49-68] 52 (11/28 0600) Resp:  [13-22] 16 (11/28 0600) BP: (105-155)/(54-73) 137/70 (11/28 0600) SpO2:  [94 %-100 %] 100 % (11/28 0600) Arterial Line BP: (135-163)/(52-62) 163/62 (11/27 1000) FiO2 (%):  [30 %] 30 % (11/28 0600) Weight:  [203 lb 7.8 oz (92.3 kg)] 203 lb 7.8 oz (92.3 kg) (11/28 0443) Last BM Date: 11/16/17  Intake/Output from previous day: 11/27 0701 - 11/28 0700 In: 3526.7 [I.V.:2977.7; NG/GT:499; IV Piggyback:50] Out: 1380 [Urine:735; Drains:200; Stool:400; Chest Tube:45] Intake/Output this  shift: No intake/output data recorded.  PE: Gen:  Alert, NAD Card:  Mildly bradycardic, no M/G/R heard Pulm:  On vent, L sided CT Abd: Soft, not distented, +BS, nontender, new colostomy changed this am and no output, ostomy is red and appears viable, midline incision without purulent drainage, packed with wet to dry dressing,  Skin: warm and dry     Anti-infectives: Anti-infectives (From admission, onward)   Start     Dose/Rate Route Frequency Ordered Stop   11/15/17 0515  vancomycin (VANCOCIN) 1,250 mg in sodium chloride 0.9 % 250 mL IVPB  Status:  Discontinued     1,250 mg 166.7 mL/hr over 90 Minutes Intravenous Every 24 hours 11/15/17 0502 11/15/17 1746   11/14/17 2315  vancomycin (VANCOCIN) IVPB 750 mg/150 ml premix  Status:  Discontinued     750 mg 150 mL/hr over 60 Minutes Intravenous Every 12 hours 11/14/17 2306 11/14/17 2309   11/13/17 1600  vancomycin (VANCOCIN) IVPB 750 mg/150 ml premix  Status:  Discontinued     750 mg 150 mL/hr over 60 Minutes Intravenous Every 12 hours 11/13/17 1057 11/14/17 2306   11/11/17 1600  vancomycin (VANCOCIN) IVPB 1000 mg/200 mL premix  Status:  Discontinued     1,000 mg 200 mL/hr over 60 Minutes Intravenous Every 12 hours 11/11/17 0443 11/13/17 1057   11/09/17 1600  vancomycin (VANCOCIN) IVPB 750 mg/150 ml premix  Status:  Discontinued     750 mg 150 mL/hr over 60 Minutes Intravenous Every 12 hours 11/09/17 1500 11/11/17 0443   11/09/17 0800  vancomycin (VANCOCIN) IVPB 750 mg/150 ml  premix  Status:  Discontinued     750 mg 150 mL/hr over 60 Minutes Intravenous Every 12 hours 11/08/17 2152 11/09/17 1500   11/09/17 0200  piperacillin-tazobactam (ZOSYN) IVPB 3.375 g     3.375 g 12.5 mL/hr over 240 Minutes Intravenous Every 8 hours 11/08/17 1821     11/08/17 2200  vancomycin (VANCOCIN) 2,000 mg in sodium chloride 0.9 % 500 mL IVPB     2,000 mg 250 mL/hr over 120 Minutes Intravenous  Once 11/08/17 2152 11/09/17 0020   11/08/17 1830   piperacillin-tazobactam (ZOSYN) IVPB 3.375 g     3.375 g 100 mL/hr over 30 Minutes Intravenous  Once 11/08/17 1821 11/08/17 2134   11/08/17 1745  piperacillin-tazobactam (ZOSYN) IVPB 3.375 g  Status:  Discontinued     3.375 g 100 mL/hr over 30 Minutes Intravenous  Once 11/08/17 1733 11/08/17 1734      Lab Results:  Recent Labs    11/16/17 0351 11/17/17 0447  WBC 23.8* 21.2*  HGB 8.2* 8.6*  HCT 24.4* 25.6*  PLT 326 294   BMET Recent Labs    11/16/17 0351 11/17/17 0447  NA 138 141  K 3.1* 3.5  CL 114* 117*  CO2 18* 19*  GLUCOSE 115* 124*  BUN 22* 23*  CREATININE 2.42* 2.45*  CALCIUM 6.6* 7.1*   PT/INR No results for input(s): LABPROT, INR in the last 72 hours. CMP     Component Value Date/Time   NA 141 11/17/2017 0447   K 3.5 11/17/2017 0447   CL 117 (H) 11/17/2017 0447   CO2 19 (L) 11/17/2017 0447   GLUCOSE 124 (H) 11/17/2017 0447   BUN 23 (H) 11/17/2017 0447   CREATININE 2.45 (H) 11/17/2017 0447   CALCIUM 7.1 (L) 11/17/2017 0447   PROT 5.7 (L) 11/14/2017 1311   ALBUMIN 1.2 (L) 11/14/2017 1311   AST 21 11/14/2017 1311   ALT 11 (L) 11/14/2017 1311   ALKPHOS 61 11/14/2017 1311   BILITOT 0.7 11/14/2017 1311   GFRNONAA 23 (L) 11/17/2017 0447   GFRAA 27 (L) 11/17/2017 0447   Lipase     Component Value Date/Time   LIPASE 19 11/08/2017 1041    Studies/Results: Ct Head Wo Contrast  Result Date: 11/15/2017 CLINICAL DATA:  82 year old male post abdominal surgery yesterday. Altered level of consciousness. Not waking up. Insert sub EXAM: CT HEAD WITHOUT CONTRAST TECHNIQUE: Contiguous axial images were obtained from the base of the skull through the vertex without intravenous contrast. COMPARISON:  11/14/2017 CT. FINDINGS: Brain: Right convexity 3.9 mm subdural hematoma without change. No significant associated mass effect. No new intracranial hemorrhage seen separate from the above described finding. No CT evidence of large acute infarct or diffuse anoxia. Atrophy.  No intracranial mass lesion noted on this unenhanced exam. Vascular: Vascular calcifications Skull: No acute abnormality Sinuses/Orbits: No acute orbital abnormality. Minimal mucosal thickening ethmoid sinus air cells. Other: Mastoid air cells and middle ear cavities are clear. IMPRESSION: Right convexity 3.9 mm subdural hematoma without change. No significant associated mass effect. No new intracranial hemorrhage. No CT evidence of large acute infarct or diffuse anoxia. Atrophy. Electronically Signed   By: Genia Del M.D.   On: 11/15/2017 14:04   Mr Brain Wo Contrast  Result Date: 11/16/2017 CLINICAL DATA:  82 y/o M; asymmetry limb movement abnormality of uncertain etiology. History of subdural hematoma. EXAM: MRI HEAD WITHOUT CONTRAST TECHNIQUE: Multiplanar, multiecho pulse sequences of the brain and surrounding structures were obtained without intravenous contrast. COMPARISON:  11/15/2017 CT head.  FINDINGS: Brain: No reduced diffusion to suggest acute or early subacute infarction. Intermediate T1 and high T2 signal acute 3-4 mm subdural hematoma over right cerebral convexity is stable. No significant mass effect. No new intracranial hemorrhage. Mild chronic microvascular ischemic changes of white matter and parenchymal volume loss of the brain. 4 mm right lateral ventricle nodule along the under belly of corpus callosum with increased T2 and intermediate T1 signal (series 11, image 17 and series 10, image 17). Vascular: Normal flow voids. Skull and upper cervical spine: Normal marrow signal. Sinuses/Orbits: Negative. Other: None. IMPRESSION: 1. No new acute intracranial abnormality. 2. Stable 3-4 mm right cerebral convexity subdural hematoma. 3. Mild chronic microvascular ischemic changes and mild parenchymal volume loss of the brain. 4. 4 mm right lateral ventricle nodule on under belly of corpus callosum, likely benign. Comparison with prior MRI imaging where available is recommended. Otherwise if  clinically indicated consider 6-12 month follow-up to evaluate for stability. Electronically Signed   By: Kristine Garbe M.D.   On: 11/16/2017 14:53      Kalman Drape , [facility] Surgery 11/17/2017, 7:58 AM Pager: [phone] Consults: [phone] Mon-Fri 7:00 am-4:30 pm Sat-Sun 7:00 am-11:30 am

## 2017-11-17 NOTE — Consult Note (Addendum)
[facility] Nurse wound consult note Reason for Consult: Consult requested for vac application.  Pt is being followed by the surgical team for assessment and plan of care.  Wound type: Full thickness abd wound to midline abd Measurement: 21X4X1cm Wound bed: beefy red Drainage (amount, consistency, odor) small amt pink drainage, no odor Periwound: Intact skin surrounding Dressing procedure/placement/frequency: Applied one piece of black foam to 134mm cont suction.  Pt was medicated for pain prior to the procedure and tolerated with minimal discomfort.  Plan for bedside nurses to change Q M/W/F  WOC Nurse ostomy consult note Stoma type/location:  Colostomy stoma to LLQ Stomal assessment/size: Stoma is red and viable with darker colored red edges Peristomal assessment: intact skin surrounding Output: No stool or flatus at this time  Ostomy pouching: 2pc.  Education provided:  Previous pouch had leaked behind the barrier. Applied 2 piece pouch with barrier ring to attempt to maintain a seal.  Enrolled patient in [place] program: No Will continue educational sessions when pt is stable and out of ICU.  Supplies ordered to room for bedside nurse use. Julien Girt MSN, RN, [place], [place], [place]

## 2017-11-18 LAB — BASIC METABOLIC PANEL
ANION GAP: 4 — AB (ref 5–15)
BUN: 22 mg/dL — ABNORMAL HIGH (ref 6–20)
CALCIUM: 7.2 mg/dL — AB (ref 8.9–10.3)
CO2: 19 mmol/L — ABNORMAL LOW (ref 22–32)
CREATININE: 2.32 mg/dL — AB (ref 0.61–1.24)
Chloride: 121 mmol/L — ABNORMAL HIGH (ref 101–111)
GFR, EST AFRICAN AMERICAN: 29 mL/min — AB (ref >60)
GFR, EST NON AFRICAN AMERICAN: 25 mL/min — AB (ref >60)
Glucose, Bld: 124 mg/dL — ABNORMAL HIGH (ref 65–99)
Potassium: 3.3 mmol/L — ABNORMAL LOW (ref 3.5–5.1)
SODIUM: 144 mmol/L (ref 135–145)

## 2017-11-18 LAB — CBC
HEMATOCRIT: 27.4 % — AB (ref 39.0–52.0)
HEMOGLOBIN: 8.8 g/dL — AB (ref 13.0–17.0)
MCH: 24.6 pg — AB (ref 26.0–34.0)
MCHC: 32.1 g/dL (ref 30.0–36.0)
MCV: 76.8 fL — ABNORMAL LOW (ref 78.0–100.0)
PLATELETS: 336 10*3/uL (ref 150–400)
RBC: 3.57 MIL/uL — ABNORMAL LOW (ref 4.22–5.81)
RDW: 18.5 % — AB (ref 11.5–15.5)
WBC: 14.1 10*3/uL — ABNORMAL HIGH (ref 4.0–10.5)

## 2017-11-18 LAB — GLUCOSE, CAPILLARY
GLUCOSE-CAPILLARY: 81 mg/dL (ref 65–99)
Glucose-Capillary: 105 mg/dL — ABNORMAL HIGH (ref 65–99)
Glucose-Capillary: 89 mg/dL (ref 65–99)

## 2017-11-18 MED ORDER — PRO-STAT SUGAR FREE PO LIQD
30.0000 mL | Freq: Three times a day (TID) | ORAL | Status: DC
  Administered 2017-11-18 – 2017-11-19 (×2): 30 mL
  Filled 2017-11-18 (×3): qty 30

## 2017-11-18 MED ORDER — POTASSIUM CHLORIDE 20 MEQ/15ML (10%) PO SOLN
40.0000 meq | Freq: Once | ORAL | Status: AC
  Administered 2017-11-18: 40 meq
  Filled 2017-11-18: qty 30

## 2017-11-18 MED ORDER — OSMOLITE 1.5 CAL PO LIQD
1000.0000 mL | ORAL | Status: DC
  Administered 2017-11-18 – 2017-11-19 (×2): 1000 mL
  Filled 2017-11-18 (×4): qty 1000

## 2017-11-18 NOTE — Progress Notes (Signed)
4 Days Post-Op   Subjective/Chief Complaint: Extubated, alert, no pain   Objective: Vital signs in last 24 hours: Temp:  [97.5 F (36.4 C)-98 F (36.7 C)] 98 F (36.7 C) (11/29 0804) Pulse Rate:  [28-86] 58 (11/29 1100) Resp:  [12-26] 23 (11/29 1100) BP: (134-168)/(58-81) 151/81 (11/29 1100) SpO2:  [98 %-100 %] 99 % (11/29 1100) Weight:  [97.6 kg (215 lb 2.7 oz)] 97.6 kg (215 lb 2.7 oz) (11/29 0500) Last BM Date: 11/18/17  Intake/Output from previous day: 11/28 0701 - 11/29 0700 In: 4617.8 [I.V.:3110; VH/QI:6962.9; IV Piggyback:50] Out: 2206 [Urine:635; Stool:1525; Chest Tube:46] Intake/Output this shift: Total I/O In: 585 [I.V.:375; NG/GT:210] Out: 150 [Urine:50; Stool:100]  GI: vac in place, stoma functional  Lab Results:  Recent Labs    11/17/17 0447 11/18/17 0504  WBC 21.2* 14.1*  HGB 8.6* 8.8*  HCT 25.6* 27.4*  PLT 294 336   BMET Recent Labs    11/17/17 0447 11/18/17 0504  NA 141 144  K 3.5 3.3*  CL 117* 121*  CO2 19* 19*  GLUCOSE 124* 124*  BUN 23* 22*  CREATININE 2.45* 2.32*  CALCIUM 7.1* 7.2*   PT/INR No results for input(s): LABPROT, INR in the last 72 hours. ABG Recent Labs    11/15/17 1201 11/16/17 0535  PHART 7.475* 7.461*  HCO3 15.7* 17.5*    Studies/Results: Mr Brain Wo Contrast  Result Date: 11/16/2017 CLINICAL DATA:  82 y/o M; asymmetry limb movement abnormality of uncertain etiology. History of subdural hematoma. EXAM: MRI HEAD WITHOUT CONTRAST TECHNIQUE: Multiplanar, multiecho pulse sequences of the brain and surrounding structures were obtained without intravenous contrast. COMPARISON:  11/15/2017 CT head. FINDINGS: Brain: No reduced diffusion to suggest acute or early subacute infarction. Intermediate T1 and high T2 signal acute 3-4 mm subdural hematoma over right cerebral convexity is stable. No significant mass effect. No new intracranial hemorrhage. Mild chronic microvascular ischemic changes of white matter and parenchymal  volume loss of the brain. 4 mm right lateral ventricle nodule along the under belly of corpus callosum with increased T2 and intermediate T1 signal (series 11, image 17 and series 10, image 17). Vascular: Normal flow voids. Skull and upper cervical spine: Normal marrow signal. Sinuses/Orbits: Negative. Other: None. IMPRESSION: 1. No new acute intracranial abnormality. 2. Stable 3-4 mm right cerebral convexity subdural hematoma. 3. Mild chronic microvascular ischemic changes and mild parenchymal volume loss of the brain. 4. 4 mm right lateral ventricle nodule on under belly of corpus callosum, likely benign. Comparison with prior MRI imaging where available is recommended. Otherwise if clinically indicated consider 6-12 month follow-up to evaluate for stability. Electronically Signed   By: Kristine Garbe M.D.   On: 11/16/2017 14:53   Dg Chest Port 1 View  Result Date: 11/17/2017 CLINICAL DATA:  Intubated EXAM: PORTABLE CHEST 1 VIEW COMPARISON:  11/14/2017 FINDINGS: Support devices are stable. Left basilar chest tube remains in place. No pneumothorax. Heart is borderline in size. Left base atelectasis, improving since prior study. IMPRESSION: Stable support devices.  No pneumothorax. Left base atelectasis, improving since prior study. Electronically Signed   By: Rolm Baptise M.D.   On: 11/17/2017 09:01    Anti-infectives: Anti-infectives (From admission, onward)   Start     Dose/Rate Route Frequency Ordered Stop   11/18/17 1800  vancomycin (VANCOCIN) IVPB 1000 mg/200 mL premix     1,000 mg 200 mL/hr over 60 Minutes Intravenous Every 48 hours 11/17/17 1419 11/22/17 2359   11/15/17 0515  vancomycin (VANCOCIN) 1,250 mg in  sodium chloride 0.9 % 250 mL IVPB  Status:  Discontinued     1,250 mg 166.7 mL/hr over 90 Minutes Intravenous Every 24 hours 11/15/17 0502 11/15/17 1746   11/14/17 2315  vancomycin (VANCOCIN) IVPB 750 mg/150 ml premix  Status:  Discontinued     750 mg 150 mL/hr over 60  Minutes Intravenous Every 12 hours 11/14/17 2306 11/14/17 2309   11/13/17 1600  vancomycin (VANCOCIN) IVPB 750 mg/150 ml premix  Status:  Discontinued     750 mg 150 mL/hr over 60 Minutes Intravenous Every 12 hours 11/13/17 1057 11/14/17 2306   11/11/17 1600  vancomycin (VANCOCIN) IVPB 1000 mg/200 mL premix  Status:  Discontinued     1,000 mg 200 mL/hr over 60 Minutes Intravenous Every 12 hours 11/11/17 0443 11/13/17 1057   11/09/17 1600  vancomycin (VANCOCIN) IVPB 750 mg/150 ml premix  Status:  Discontinued     750 mg 150 mL/hr over 60 Minutes Intravenous Every 12 hours 11/09/17 1500 11/11/17 0443   11/09/17 0800  vancomycin (VANCOCIN) IVPB 750 mg/150 ml premix  Status:  Discontinued     750 mg 150 mL/hr over 60 Minutes Intravenous Every 12 hours 11/08/17 2152 11/09/17 1500   11/09/17 0200  piperacillin-tazobactam (ZOSYN) IVPB 3.375 g     3.375 g 12.5 mL/hr over 240 Minutes Intravenous Every 8 hours 11/08/17 1821 11/22/17 2359   11/08/17 2200  vancomycin (VANCOCIN) 2,000 mg in sodium chloride 0.9 % 500 mL IVPB     2,000 mg 250 mL/hr over 120 Minutes Intravenous  Once 11/08/17 2152 11/09/17 0020   11/08/17 1830  piperacillin-tazobactam (ZOSYN) IVPB 3.375 g     3.375 g 100 mL/hr over 30 Minutes Intravenous  Once 11/08/17 1821 11/08/17 2134   11/08/17 1745  piperacillin-tazobactam (ZOSYN) IVPB 3.375 g  Status:  Discontinued     3.375 g 100 mL/hr over 30 Minutes Intravenous  Once 11/08/17 1733 11/08/17 1734      Assessment/Plan: POD #4 hartmanns  - continue tube feeds -speech to see- he can take diet when cleared -vac changes   Rolm Bookbinder 11/18/2017

## 2017-11-18 NOTE — Progress Notes (Signed)
PULMONARY / CRITICAL CARE MEDICINE   Name: Roberto Owen MRN: [id number] DOB: 10/28/1935    ADMISSION DATE:  11/08/2017 CONSULTATION DATE:  11/14/17  REFERRING MD:  FPTS  CHIEF COMPLAINT:  confusion  HISTORY OF PRESENT ILLNESS:   82 year old male history of peptic ulcer disease, diverticulitis with abscess of the sigmoid colon, colocutaneous fistula with indwelling intraabdominal drain who presented with abdominal pain and draining from around his intra-abdominal drain site.  CT abdomen pelvis on 11/19 showed no residual abscess but circumferential colonic wall thickening.  He was myosin and Zosyn planned artmann this week after being seen by surgery.  He was also noted to have a loculated pleural effusion for which interventional radiology placed a left-sided pigtail catheter on 11/23.  Overnight he had worsening abdominal pain and this morning he was found to be more confused.  He was noted to have an episode of bradycardia with heart rate in the 20s and became unresponsive, spO2 70's.  Chest compressions were initiated however after a single compression he awoke and reportedly pushed the team away.   Subsequent CT abdomen pelvis showed concern for perforated viscus and so he was taken to the operating room for exploratory laparotomy.  PAST MEDICAL HISTORY :  He  has a past medical history of Anemia, Bladder diverticulum (11/10/2017), Esophageal ulcer, Gastric ulcer, GI bleed, History of blood transfusion (07/2017), and NSAID induced gastritis.  PAST SURGICAL HISTORY: He  has a past surgical history that includes Esophagogastroduodenoscopy (egd) with propofol (N/A, 07/25/2017); Colonoscopy (~ 05/2016); IR Radiologist Eval & Mgmt (09/30/2017); IR Catheter Tube Change (10/28/2017); IR Radiologist Eval & Mgmt (10/19/2017); and laparotomy (N/A, 11/14/2017).  No Known Allergies  No current facility-administered medications on file prior to encounter.    Current Outpatient Medications on File  Prior to Encounter  Medication Sig  . acetaminophen (TYLENOL) 500 MG tablet Take 1,000 mg by mouth every 6 (six) hours as needed for mild pain.  . ferrous sulfate 325 (65 FE) MG tablet Take 1 tablet (325 mg total) by mouth daily with breakfast.  . Multiple Vitamins-Minerals (ADULT ONE DAILY GUMMIES) CHEW Chew 1 tablet by mouth daily.   . pantoprazole (PROTONIX) 40 MG tablet TAKE 1 TABLET BY MOUTH TWICE DAILY  . Simethicone 180 MG CAPS Take 1 capsule every 4 (four) hours as needed by mouth (for gas, bloating).  . Sod Bicarb-Ginger-Fennel-Cham (GRIPE WATER) LIQD Take 30 mLs every 4 (four) hours as needed by mouth (for gas).   . traMADol (ULTRAM) 50 MG tablet Take 1 tablet (50 mg total) by mouth every 6 (six) hours as needed for moderate pain or severe pain. (Patient not taking: Reported on 10/23/2017)    FAMILY HISTORY:  His indicated that the status of his mother is unknown. He indicated that the status of his father is unknown. He indicated that the status of his neg hx is unknown.  SOCIAL HISTORY: He  reports that  has never smoked. he has never used smokeless tobacco. He reports that he does not drink alcohol or use drugs.  REVIEW OF SYSTEMS:   Patient denies pain or SOB.   SUBJECTIVE:  Feels well, states he is getting stronger. He is pleased with his ability to move his extremities this morning.   VITAL SIGNS: BP (!) 154/73   Pulse 66   Temp 97.6 F (36.4 C) (Oral)   Resp 20   Ht 6\' 6"  (1.981 m)   Wt 215 lb 2.7 oz (97.6 kg)   SpO2 98%  BMI 24.87 kg/m   HEMODYNAMICS:    VENTILATOR SETTINGS: Vent Mode: PRVC FiO2 (%):  [30 %] 30 % Set Rate:  [16 bmp] 16 bmp Vt Set:  [620 mL] 620 mL PEEP:  [5 cmH20] 5 cmH20 Plateau Pressure:  [21 cmH20] 21 cmH20  INTAKE / OUTPUT: I/O last 3 completed shifts: In: 6657.8 [I.V.:4730; NG/GT:1877.8; IV Piggyback:50] Out: 7209 [Urine:1010; Stool:1925; Chest Tube:71]  PHYSICAL EXAMINATION: General:  Ill appearing male, lying in bed  sedated.  Neuro:  No distress. Follows commands and able to move all extremities.  HEENT:  Poor dentition, ETT and OG in place, MMM Cardiovascular:  Irregular rhythm, no murmur Lungs:  Coarse breath sounds bilaterally, decreased over left base Abdomen:  Soft, nondistended, ostomy pink with output, dressings C/D/I  Skin:  No rashes   LABS:  BMET Recent Labs  Lab 11/16/17 0351 11/17/17 0447 11/18/17 0504  NA 138 141 144  K 3.1* 3.5 3.3*  CL 114* 117* 121*  CO2 18* 19* 19*  BUN 22* 23* 22*  CREATININE 2.42* 2.45* 2.32*  GLUCOSE 115* 124* 124*    Electrolytes Recent Labs  Lab 11/16/17 0351  11/16/17 1851 11/17/17 0447 11/17/17 1700 11/18/17 0504  CALCIUM 6.6*  --   --  7.1*  --  7.2*  MG  --    < > 1.8 1.8 1.8  --   PHOS  --    < > 3.2 2.8 3.0  --    < > = values in this interval not displayed.    CBC Recent Labs  Lab 11/16/17 0351 11/17/17 0447 11/18/17 0504  WBC 23.8* 21.2* 14.1*  HGB 8.2* 8.6* 8.8*  HCT 24.4* 25.6* 27.4*  PLT 326 294 336    Coag's No results for input(s): APTT, INR in the last 168 hours.  Sepsis Markers Recent Labs  Lab 11/14/17 1311 11/14/17 1531  LATICACIDVEN 1.8 1.9    ABG Recent Labs  Lab 11/14/17 2349 11/15/17 1201 11/16/17 0535  PHART 7.512* 7.475* 7.461*  PCO2ART 21.1* 21.4* 24.8*  PO2ART 119* 163.0* 136*    Liver Enzymes Recent Labs  Lab 11/14/17 1311  AST 21  ALT 11*  ALKPHOS 61  BILITOT 0.7  ALBUMIN 1.2*    Cardiac Enzymes Recent Labs  Lab 11/14/17 1311 11/16/17 1851  TROPONINI <0.03 <0.03    Glucose Recent Labs  Lab 11/17/17 0821 11/17/17 1139 11/17/17 1621 11/17/17 2022 11/18/17 0015 11/18/17 0411  GLUCAP 89 96 98 98 89 81    Imaging Dg Chest Port 1 View  Result Date: 11/17/2017 CLINICAL DATA:  Intubated EXAM: PORTABLE CHEST 1 VIEW COMPARISON:  11/14/2017 FINDINGS: Support devices are stable. Left basilar chest tube remains in place. No pneumothorax. Heart is borderline in size.  Left base atelectasis, improving since prior study. IMPRESSION: Stable support devices.  No pneumothorax. Left base atelectasis, improving since prior study. Electronically Signed   By: Rolm Baptise M.D.   On: 11/17/2017 09:01   STUDIES:  CT A/P 11/14/17: New pneumoperitoneum compared to the prior examination, compatible with perforated viscus. The exact source of this is uncertain, but this is suspected be related to gas traversing the chronic diverticular abscess wall at the site of the indwelling catheter. This was discussed by phone with Dr. Romana Juniper. Diffuse body wall edema, mesenteric edema and retroperitoneal edema with bilateral pleural effusions, suspicious for a state of anasarca. Multiple nonobstructive calculi in the collecting systems of both kidneys measuring up to 8 mm in the lower pole collecting system  of the right kidney. There is also some new high attenuation material lying dependently in the large right bladder diverticulum. This could represent multiple tiny calculi, but this is unlikely given the development compared to the recent prior examination. Rather, this is favored to represent some proteinaceous/hemorrhagic debris or residual contrast material from prior CT scan 11/08/2017.  CXR 11/25: stable cardiomediastinal silhouette, no pneumothorax. Mild left basilar atelectasis or infiltrate is noted with minimal left pleural effusion.  CT Head 11/25: Thin subdural collection over right cerebral convexity, likely subdural hematoma. No significant mass effect. No evidence for stroke, brain parenchymal hemorrhage, or mass effect. Mild chronic microvascular ischemic changes and parenchymal volume loss of the brain.  CXR 11/25: ETT appropriate, slight worsening of small left effusion with LLL atelectasis or infiltrate.   US renal 11/25: Bilateral nephrolithiasis without obstructive uropathy. Calculi measure on the order of 8 mm each, one seen in the lower the right kidney and  the second in the upper pole of left kidney. No loss of cortical-medullary distinction of either kidney. Foley decompressed urinary bladder. Small amount of perihepatic free fluid.  CT head 11/26: SDH unchanged, no imaging findings to suggest evolving large ischemia.   MRI 11/27: NAICA, stable 3-4 mm right cerebral convexity subdural hematoma. 4 mm right lateral ventricle nodule on under belly of corpus callosum, likely benign. Comparison with prior MRI imaging where available is recommended. Otherwise if clinically indicated consider 6-12 month follow-up to evaluate for stability.  CULTURES: 11/23 pleural fluid: gram stain negative, culture NG x 2 days 11/25 BCx: NGTD   ANTIBIOTICS: Vanc 11/19 >>  Zosyn 11/19 >>   SIGNIFICANT EVENTS: 11/19 admitted with abdominal pain 11/23 left chest tube placed 11/25 AMS, CT with perf > OR   LINES/TUBES: PIV L wrist 11/19 >  PIV R forearm 11/25 >  Radial art line 11/25 >  CVC left subclavian 11/25 >  Chest tube 12 french 11/23 >  Foley 11/25 >  ETT 11/25 > NG 11/25 >   DISCUSSION: 82 year old male history of peptic ulcer disease, diverticulitis with abscess of the sigmoid colon, colocutaneous fistula with indwelling intraabdominal drain who presented with abdominal pain and draining from around his intra-abdominal drain site, found to have a loculated pleural effusion for which a left sided chest tube was placed. Progressively encephalopathic with CT showing concern for perforated viscus > OR for perforated viscus.   ASSESSMENT / PLAN:  NEUROLOGIC A:   New small SDH 11/25, unchanged on CT head 11/26  MRI without evidence of acute stroke, no neuro deficits on exam 11/28 P:   RASS goal: -1  -continue to hold heparin, will need to discuss with neurology about when to restart heparin  PULMONARY A: Acute respiratory failure  Loculated L pleural effusion  P:   -continue vanc/zosyn -continue chest tube to water seal  CARDIOVASCULAR A:   New onset afib  P:  -continue to hold heparin -continue amiodarone, lopressor   RENAL A:   AKI, stabilizing Renal calculi UOP slightly improved at 678mL x last 24 hours P:   -daily BMP  GASTROINTESTINAL A:   Perforated diverticulitis s/p partial sigmoid colectomy with end colostomy Hx of diverticulitis with abscess of the sigmoid colon, colocutaneous fistula with indwelling intraabdominal drain, c/w possible intraabdominal malignancy P:   -surgery following: 1/2 goal TFs -continue vanc/zosyn -monitor ostomy output (1525mL last 24 hours)  HEMATOLOGIC A:   Anemia in setting of SDH, baseline 9.0 P:  -daily CBC   INFECTIOUS A:  Sepsis 2/2 perforated viscus  Loculated L pleural effusion P:   -continue vanc/zosyn -follow cultures  Hypokalemia: K 3.3, repleted with 40 meQ per tube.   Dispo: transfer to tele today and back to [facility] 11/30  FAMILY  - Updates: no family at bedside on my exam - Inter-disciplinary family meet or Palliative Care meeting due by:  12/2  Ralene Ok, MD PGY-2, [facility] 11/18/2017 7:13 AM

## 2017-11-18 NOTE — Care Management Note (Signed)
Case Management Note  Patient Details  Name: Roberto Owen MRN: [id number] Date of Birth: October 07, 1935  Subjective/Objective:  From home with daughter Roberto Owen, she works but he states he is indep at  Home he has been in [place] for a year now (from [place]) , presents with afib/flutter with RVR, htn, colonic diverticulitis and partial colon obstruction, will dc cardizem drip, add lopressor, consider GI eval.  He does not use any assistive devices at home, he does not have a PCP, but he states his daughter Roberto Owen is working on getting him a PCP.                   Action/Plan: NCM will follow for dc needs.   Expected Discharge Date:                  Expected Discharge Plan:  [place]  In-House Referral:     Discharge planning Services  CM Consult  Post Acute Care Choice:    Choice offered to:     DME Arranged:    DME Agency:     HH Arranged:    [place] Agency:     Status of Service:  In process, will continue to follow  If discussed at Long Length of Stay Meetings, dates discussed:    Additional Comments: 11/18/2017 Discussed in LOS 11/29 - pt remains appropriate for continued stay and deemed appropriate for [facility] referral per physician advisor (both LTACH's given referral during meeting).  CM discussed recommendation with attending and attending does not agree with recommendation due to; pt will likely pass swallow study and TPN will be discontinued, IV antibiotics will be discontinued soon, pt is expected to be appropriate for SNF as highest level of care need at discharge.  CM informed both LTACHs.  Maryclare Labrador, RN 11/18/2017, 5:45 PM

## 2017-11-18 NOTE — Evaluation (Signed)
Clinical/Bedside Swallow Evaluation Patient Details  Name: Orien Mayhall MRN: [id number] Date of Birth: 10-Jun-1935  Today's Date: 11/18/2017 Time: SLP Start Time (ACUTE ONLY): 2947 SLP Stop Time (ACUTE ONLY): 1446 SLP Time Calculation (min) (ACUTE ONLY): 18 min  Past Medical History:  Past Medical History:  Diagnosis Date  . Anemia   . Bladder diverticulum 11/10/2017  . Esophageal ulcer    hx/notes 09/15/2017  . Gastric ulcer    hx/notes 09/15/2017  . GI bleed   . History of blood transfusion 07/2017  . NSAID induced gastritis    Past Surgical History:  Past Surgical History:  Procedure Laterality Date  . COLONOSCOPY  ~ 05/2016  . ESOPHAGOGASTRODUODENOSCOPY (EGD) WITH PROPOFOL N/A 07/25/2017   Procedure: ESOPHAGOGASTRODUODENOSCOPY (EGD) WITH PROPOFOL;  Surgeon: Mauri Pole, MD;  Location: [facility];  Service: Endoscopy;  Laterality: N/A;  . IR CATHETER TUBE CHANGE  10/28/2017  . IR RADIOLOGIST EVAL & MGMT  09/30/2017  . IR RADIOLOGIST EVAL & MGMT  10/19/2017  . LAPAROTOMY N/A 11/14/2017   Procedure: EXPLORATORY LAPAROTOMY WITH HARTMANN PROCEDURE;  Surgeon: Clovis Riley, MD;  Location: [facility] OR;  Service: General;  Laterality: N/A;   HPI:  Pt is an 82 year old male with a known peptic ulcer disease, history of GI bleed, diverticulosis who presents with draining around indwelling catheter secondary to diverticulitis with abscess who was brought to the ED with increasing weight loss, GI spasms, cough plus leaking around his diverticular catheter site. Pt now s/p ex lap with partial sigmoid colectomy on 11/25. Of note, pt was intubated from 11/25-11/28.   Assessment / Plan / Recommendation Clinical Impression  Pt initially has cognitive difficulty initiating a swallow to command and even with his first few boluses, but then he started to swallow with much more automaticity. He had no overt s/s of aspiration, but he does not swallow any of his secretions. Even with cueing  from SLP to attempt to swallow his saliva, he still ended up having to orally expectorate it. Pt also does not give a clear history and he has conflicting reports of his symptoms. Given the above as well as risk factors such as recent intubation and SDH, recommend to proceed with instrumental testing before making diet recommendations. He is nutritionally supported via NGT until that time (likely tomorrow). SLP Visit Diagnosis: Dysphagia, unspecified (R13.10)    Aspiration Risk  Mild aspiration risk;Moderate aspiration risk    Diet Recommendation NPO;Ice chips PRN after oral care   Medication Administration: Via alternative means    Other  Recommendations Oral Care Recommendations: Oral care QID Other Recommendations: Have oral suction available   Follow up Recommendations (tba)      Frequency and Duration            Prognosis Prognosis for Safe Diet Advancement: Good      Swallow Study   General HPI: Pt is an 82 year old male with a known peptic ulcer disease, history of GI bleed, diverticulosis who presents with draining around indwelling catheter secondary to diverticulitis with abscess who was brought to the ED with increasing weight loss, GI spasms, cough plus leaking around his diverticular catheter site. Pt now s/p ex lap with partial sigmoid colectomy on 11/25. Of note, pt was intubated from 11/25-11/28. Type of Study: Bedside Swallow Evaluation Previous Swallow Assessment: none in chart Diet Prior to this Study: NPO;NG Tube Temperature Spikes Noted: No Respiratory Status: Room air History of Recent Intubation: Yes Length of Intubations (days): 4 days Date extubated: 11/17/17 Behavior/Cognition:  Alert;Cooperative;Pleasant mood;Confused Oral Cavity Assessment: Within Functional Limits Oral Care Completed by SLP: No Oral Cavity - Dentition: Poor condition;Missing dentition Patient Positioning: Upright in bed Baseline Vocal Quality: Low vocal intensity;Other  (comment)(mildly rough) Volitional Swallow: Unable to elicit    Oral/Motor/Sensory Function Overall Oral Motor/Sensory Function: (pt with difficulty following commands to assess)   Ice Chips Ice chips: Impaired Presentation: Spoon Pharyngeal Phase Impairments: Suspected delayed Swallow   Thin Liquid Thin Liquid: Impaired Presentation: Spoon;Straw Pharyngeal  Phase Impairments: Suspected delayed Swallow    Nectar Thick Nectar Thick Liquid: Not tested   Honey Thick Honey Thick Liquid: Not tested   Puree Puree: Within functional limits Presentation: Spoon   Solid   GO   Solid: Not tested        Germain Osgood 11/18/2017,3:34 PM  Germain Osgood, M.A. CCC-SLP [phone]

## 2017-11-18 NOTE — Progress Notes (Signed)
Pathology report reviewed. T4N1 perforated adenocarcinoma. I have updated Roberto Owen and his daughter, [name] with this news. We will see if oncology can meet with him before he goes home.

## 2017-11-18 NOTE — Progress Notes (Signed)
Reviewed new diagnosis of colorectal carcinoma with our GI oncology team. Roberto Owen will see Dr Ma Rings at the cancer center 12/10. The patient and family will be contacted with the specific time and directions.   Please let me know if I can be of furthe rhelp.

## 2017-11-18 NOTE — Progress Notes (Signed)
Nutrition Follow-up  DOCUMENTATION CODES:   Severe malnutrition in context of chronic illness  INTERVENTION:   Initiate Osmolite 1.5 @ 25 ml/hr via cortrak tube and increase by 10 ml every 4 hours to goal rate of 55 ml/hr.   30 ml Prostat TID.    Tube feeding regimen provides 2280 kcal (100% of needs), 128 grams of protein, and 1006 ml of H2O.   NUTRITION DIAGNOSIS:   Severe Malnutrition related to chronic illness(altered GI function) as evidenced by severe muscle depletion, severe fat depletion, percent weight loss(8% in 3 months).  Ongoing  GOAL:   Patient will meet greater than or equal to 90% of their needs  Progressing  MONITOR:   Diet advancement, Labs, Weight trends, TF tolerance, Skin, I & O's  REASON FOR ASSESSMENT:   Consult Assessment of nutrition requirement/status  ASSESSMENT:   Pt with PMH of anemia, peptic ulcer disease, hx of GI bleed, diverticulosis presents with colonic obstruction and loculated pleural effusion possibly causing persistent hiccups  11/25- S/P sigmoid colectomy and colostomy for perforated viscus 11/28- extubated 11/29- transferred from ICU to floor; per surgery note, pathology positive for 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 Per SLP, pt is making improvements with swallowing, but would benefit from instrumental testing. Pt will continue with TF today.   Vital AF 1.2 infusing via cortrak tube @ 70 ml/hr, which provides 2016 kcals, 126 grams protein, and 1362 ml free water daily. This meets 92% of estimated kcal needs and 100% of estimated protein needs.   Spoke with pt and discussed how he will receive nutrition via cortrak tube until he is able to take PO's.   Labs reviewed: K: 3.3, CBGS: 81-105  Diet Order:  Diet NPO time specified  EDUCATION NEEDS:   No education needs have been identified at this time  Skin:  Skin Assessment: Skin Integrity Issues: Skin Integrity  Issues:: Wound VAC Wound Vac: abdomen Incisions: abdomen, with wound vac  Last BM:  11/18/17 (200 ml output via colostomy)  Height:   Ht Readings from Last 1 Encounters:  11/18/17 6\' 6"  (1.981 m)    Weight:   Wt Readings from Last 1 Encounters:  11/18/17 215 lb 2.7 oz (97.6 kg)    Ideal Body Weight:  97 kg  BMI:  Body mass index is 24.87 kg/m.  Estimated Nutritional Needs:   Kcal:  2200-2400  Protein:  120-135 grams  Fluid:  > 2.2 L    Alann Avey A. Jimmye Norman, RD, LDN, CDE Pager: [phone] After hours Pager: [phone]

## 2017-11-19 ENCOUNTER — Inpatient Hospital Stay (HOSPITAL_COMMUNITY): Payer: Medicare Other

## 2017-11-19 DIAGNOSIS — C187 Malignant neoplasm of sigmoid colon: Secondary | ICD-10-CM

## 2017-11-19 DIAGNOSIS — C779 Secondary and unspecified malignant neoplasm of lymph node, unspecified: Secondary | ICD-10-CM

## 2017-11-19 DIAGNOSIS — S065X9A Traumatic subdural hemorrhage with loss of consciousness of unspecified duration, initial encounter: Secondary | ICD-10-CM

## 2017-11-19 LAB — BASIC METABOLIC PANEL
ANION GAP: 5 (ref 5–15)
BUN: 24 mg/dL — ABNORMAL HIGH (ref 6–20)
CO2: 18 mmol/L — ABNORMAL LOW (ref 22–32)
Calcium: 7.4 mg/dL — ABNORMAL LOW (ref 8.9–10.3)
Chloride: 122 mmol/L — ABNORMAL HIGH (ref 101–111)
Creatinine, Ser: 2.27 mg/dL — ABNORMAL HIGH (ref 0.61–1.24)
GFR calc Af Amer: 30 mL/min — ABNORMAL LOW (ref >60)
GFR, EST NON AFRICAN AMERICAN: 26 mL/min — AB (ref >60)
GLUCOSE: 94 mg/dL (ref 65–99)
POTASSIUM: 3.9 mmol/L (ref 3.5–5.1)
Sodium: 145 mmol/L (ref 135–145)

## 2017-11-19 LAB — CULTURE, BLOOD (ROUTINE X 2)
CULTURE: NO GROWTH
Culture: NO GROWTH
Special Requests: ADEQUATE

## 2017-11-19 LAB — CBC
HEMATOCRIT: 27.2 % — AB (ref 39.0–52.0)
Hemoglobin: 8.7 g/dL — ABNORMAL LOW (ref 13.0–17.0)
MCH: 24.9 pg — AB (ref 26.0–34.0)
MCHC: 32 g/dL (ref 30.0–36.0)
MCV: 77.7 fL — AB (ref 78.0–100.0)
Platelets: 328 10*3/uL (ref 150–400)
RBC: 3.5 MIL/uL — ABNORMAL LOW (ref 4.22–5.81)
RDW: 19 % — AB (ref 11.5–15.5)
WBC: 10.1 10*3/uL (ref 4.0–10.5)

## 2017-11-19 LAB — GLUCOSE, CAPILLARY
GLUCOSE-CAPILLARY: 90 mg/dL (ref 65–99)
GLUCOSE-CAPILLARY: 55 mg/dL — AB (ref 65–99)
Glucose-Capillary: 84 mg/dL (ref 65–99)
Glucose-Capillary: 90 mg/dL (ref 65–99)
Glucose-Capillary: 84 mg/dL (ref 65–99)
Glucose-Capillary: 66 mg/dL (ref 65–99)

## 2017-11-19 MED ORDER — ACETAMINOPHEN 160 MG/5ML PO SOLN
650.0000 mg | Freq: Four times a day (QID) | ORAL | Status: DC
  Administered 2017-11-19 – 2017-11-24 (×4): 650 mg via ORAL
  Filled 2017-11-19 (×29): qty 20.3

## 2017-11-19 MED ORDER — METOPROLOL TARTRATE 12.5 MG HALF TABLET
12.5000 mg | ORAL_TABLET | Freq: Two times a day (BID) | ORAL | Status: DC
  Administered 2017-11-19 – 2017-11-26 (×15): 12.5 mg via ORAL
  Filled 2017-11-19 (×15): qty 1

## 2017-11-19 MED ORDER — PANTOPRAZOLE SODIUM 40 MG PO TBEC
40.0000 mg | DELAYED_RELEASE_TABLET | Freq: Two times a day (BID) | ORAL | Status: DC
  Administered 2017-11-19 – 2017-11-26 (×14): 40 mg via ORAL
  Filled 2017-11-19 (×15): qty 1

## 2017-11-19 MED ORDER — RAMELTEON 8 MG PO TABS
8.0000 mg | ORAL_TABLET | Freq: Every day | ORAL | Status: DC
  Administered 2017-11-19 – 2017-11-25 (×7): 8 mg via ORAL
  Filled 2017-11-19 (×8): qty 1

## 2017-11-19 MED ORDER — SODIUM CHLORIDE 0.9 % IV SOLN
INTRAVENOUS | Status: DC
  Administered 2017-11-19 – 2017-11-20 (×2): via INTRAVENOUS

## 2017-11-19 MED ORDER — POTASSIUM CHLORIDE 10 MEQ/100ML IV SOLN
10.0000 meq | INTRAVENOUS | Status: AC
  Administered 2017-11-19 (×4): 10 meq via INTRAVENOUS
  Filled 2017-11-19 (×4): qty 100

## 2017-11-19 MED ORDER — AMIODARONE HCL 200 MG PO TABS
200.0000 mg | ORAL_TABLET | Freq: Every day | ORAL | Status: DC
  Administered 2017-11-20 – 2017-11-26 (×7): 200 mg via ORAL
  Filled 2017-11-19 (×7): qty 1

## 2017-11-19 MED ORDER — HEPARIN SODIUM (PORCINE) 5000 UNIT/ML IJ SOLN
5000.0000 [IU] | Freq: Three times a day (TID) | INTRAMUSCULAR | Status: DC
  Administered 2017-11-19 – 2017-11-22 (×8): 5000 [IU] via SUBCUTANEOUS
  Filled 2017-11-19 (×8): qty 1

## 2017-11-19 MED ORDER — ENSURE ENLIVE PO LIQD
237.0000 mL | Freq: Two times a day (BID) | ORAL | Status: DC
  Administered 2017-11-19 – 2017-11-22 (×2): 237 mL via ORAL

## 2017-11-19 NOTE — Progress Notes (Signed)
MD:  Please provide medical clearance for MBS due to colorectal carcinoma/concern for obstruction.  Luanna Salk, [facility] SLP [phone]

## 2017-11-19 NOTE — Clinical Social Work Note (Signed)
Clinical Social Work Assessment  Patient Details  Name: Roberto Owen MRN: [id number] Date of Birth: 1935/10/24  Date of referral:  11/19/17               Reason for consult:  Facility Placement, Discharge Planning                Permission sought to share information with:  Chartered certified accountant granted to share information::  Yes, Verbal Permission Granted  Name::        Agency::  SNF's  Relationship::     Contact Information:     Housing/Transportation Living arrangements for the past 2 months:  Single Family Home Source of Information:  Patient, Medical Team Patient Interpreter Needed:  None Criminal Activity/Legal Involvement Pertinent to Current Situation/Hospitalization:  No - Comment as needed Significant Relationships:  Adult Children Lives with:  Adult Children Do you feel safe going back to the place where you live?  Yes Need for family participation in patient care:  Yes (Comment)  Care giving concerns:  PT recommending SNF placement once medically stable for discharge.   Social Worker assessment / plan:  CSW met with patient. No supports at bedside. CSW introduced role and explained that PT recommendations would be discussed. Patient very motivated for rehabilitation and is agreeable to SNF placement. NG tube removed today. Patient understands that referral will be sent out once chest tube is out. SNF list provided for review with his family. No further concerns. CSW encouraged patient to contact CSW as needed. CSW will continue to follow patient for support and facilitate discharge to SNF once medically stable.  Employment status:  Retired Forensic scientist:  Medicare PT Recommendations:  [place] / Referral to community resources:  [place]  Patient/Family's Response to care:  Patient agreeable to SNF placement. Patient's cihldren supportive and involved in patient's care. Patient appreciated  social work intervention.  Patient/Family's Understanding of and Emotional Response to Diagnosis, Current Treatment, and Prognosis:  Patient has a good understanding of the reason for admission and his need for rehab prior to returning home. Patient appears happy with hospital care.  Emotional Assessment Appearance:  Appears stated age Attitude/Demeanor/Rapport:  Other(Pleasant) Affect (typically observed):  Accepting, Appropriate, Calm, Pleasant Orientation:  Oriented to Self, Oriented to Place, Oriented to  Time, Oriented to Situation Alcohol / Substance use:  Never Used Psych involvement (Current and /or in the community):  No (Comment)  Discharge Needs  Concerns to be addressed:  Care Coordination Readmission within the last 30 days:  Yes Current discharge risk:  Dependent with Mobility Barriers to Discharge:  Continued Medical Work up   Candie Chroman, LCSW 11/19/2017, 4:03 PM

## 2017-11-19 NOTE — Progress Notes (Addendum)
[place]  [phone]   HEMATOLOGY ONCOLOGY INPATIENT CONSULTATION   Roberto Owen  DOB: 10/20/1944  MR#: [id number]  CSN#: [id number]    Requesting Physician: Dr. Donne Hazel   Patient Care Team: Patient, No Pcp Per as PCP - General (General Practice)  Reason for consult:  colon cancer   History of present illness: Roberto Owen is a 82 y.o. male who presented to the ED on 11/08/17 for abdomainl pain, cough and leaking around his diverticular catheter site. He was admitted, further work up showed sigmoid colon cancer with perforation. I was consult to discuss management of his colon cancer .   Pt has been pretty healthy until 3 months ago, 3 months ago, when he developed diverticulitis and abscess.  He had drainage catheter placed and treated with antibiotics.  He also reports worsening fatigue, decreased appetite, and weight loss in the past few months.  After admission, he had a CT AP on 11/19 that shows persistent abscess.  Also showed new left lower lobe loculated effusion.  Also found to be in atrial fibrillation with rapid ventricular rate. CT Chest from 11/21 shows possible URI from pleural effusion. Procedure was done to drain his effusion. CT Head from 11/25 shows possible subdural hematoma, no evidence for stroke. Repeat CT AP on 11/25 shows calculi in both kidneys up to 61m and retroperitoneal  He unfortunately developed bowel perforation, and underwent a laparotomy with hartmann procedure by Dr. CWindle Guardand Gerkin on 11/14/17. Pathology results show evidence for invasive adenocarcinoma metastatic to 2 lymph nodes.   He has been recovering slowly from surgery, he was allowed to eat solid food for the first time today.   Objective:  Vitals:   11/19/17 0746 11/19/17 0931  BP: (!) 141/64 (!) 143/66  Pulse: 70 72  Resp: 18   Temp: 98.6 F (37 C)   SpO2: 100%     Body mass index is 25.81 kg/m.  Intake/Output Summary (Last 24 hours) at  11/19/2017 1451 Last data filed at 11/19/2017 1232 Gross per 24 hour  Intake 6993.84 ml  Output 2851 ml  Net 4142.84 ml     Sclerae unicteric  Oropharynx clear  No peripheral adenopathy  Lungs clear -- no rales or rhonchi  Heart regular rate and rhythm  Abdomen soft, (+) midline surgical incision covered by wound VAC, (+) colostomy bag on the left side  MSK no focal spinal tenderness, no peripheral edema  Neuro nonfocal  Breast exam: exam deferred  CBG (last 3)  Recent Labs    11/19/17 0521 11/19/17 0744 11/19/17 1135  GLUCAP 84 90 90     Labs:  CBC Latest Ref Rng & Units 11/19/2017 11/18/2017 11/17/2017  WBC 4.0 - 10.5 K/uL 10.1 14.1(H) 21.2(H)  Hemoglobin 13.0 - 17.0 g/dL 8.7(L) 8.8(L) 8.6(L)  Hematocrit 39.0 - 52.0 % 27.2(L) 27.4(L) 25.6(L)  Platelets 150 - 400 K/uL 328 336 294    CMP Latest Ref Rng & Units 11/19/2017 11/18/2017 11/17/2017  Glucose 65 - 99 mg/dL 94 124(H) 124(H)  BUN 6 - 20 mg/dL 24(H) 22(H) 23(H)  Creatinine 0.61 - 1.24 mg/dL 2.27(H) 2.32(H) 2.45(H)  Sodium 135 - 145 mmol/L 145 144 141  Potassium 3.5 - 5.1 mmol/L 3.9 3.3(L) 3.5  Chloride 101 - 111 mmol/L 122(H) 121(H) 117(H)  CO2 22 - 32 mmol/L 18(L) 19(L) 19(L)  Calcium 8.9 - 10.3 mg/dL 7.4(L) 7.2(L) 7.1(L)  Total Protein 6.5 - 8.1 g/dL - - -  Total Bilirubin 0.3 - 1.2 mg/dL - - -  Alkaline Phos 38 - 126 U/L - - -  AST 15 - 41 U/L - - -  ALT 17 - 63 U/L - - -     Urine Studies No results for input(s): UHGB, CRYS in the last 72 hours.  Invalid input(s): UACOL, UAPR, USPG, UPH, UTP, UGL, [place], UBIL, UNIT, [place] Axon [place], [place]  Basic Metabolic Panel: Recent Labs  Lab 11/15/17 0343 11/16/17 0351 11/16/17 1513 11/16/17 1851 11/17/17 0447 11/17/17 1700 11/18/17 0504 11/19/17 0800  NA 136 138  --   --  141  --  144 145  K 3.6 3.1*  --   --  3.5  --  3.3* 3.9  CL 113* 114*  --   --  117*  --  121* 122*  CO2 17* 18*  --   --  19*  --  19* 18*   GLUCOSE 159* 115*  --   --  124*  --  124* 94  BUN 18 22*  --   --  23*  --  22* 24*  CREATININE 2.21* 2.42*  --   --  2.45*  --  2.32* 2.27*  CALCIUM 6.5* 6.6*  --   --  7.1*  --  7.2* 7.4*  MG  --   --  1.9 1.8 1.8 1.8  --   --   PHOS  --   --  3.5 3.2 2.8 3.0  --   --    GFR Estimated Creatinine Clearance: 33.6 mL/min (A) (by C-G formula based on SCr of 2.27 mg/dL (H)). Liver Function Tests: Recent Labs  Lab 11/14/17 1311  AST 21  ALT 11*  ALKPHOS 61  BILITOT 0.7  PROT 5.7*  ALBUMIN 1.2*   No results for input(s): LIPASE, AMYLASE in the last 168 hours. Recent Labs  Lab 11/14/17 1311  AMMONIA 19   Coagulation profile No results for input(s): INR, PROTIME in the last 168 hours.  CBC: Recent Labs  Lab 11/15/17 0343 11/16/17 0351 11/17/17 0447 11/18/17 0504 11/19/17 0506  WBC 19.5* 23.8* 21.2* 14.1* 10.1  HGB 8.8* 8.2* 8.6* 8.8* 8.7*  HCT 26.5* 24.4* 25.6* 27.4* 27.2*  MCV 74.6* 73.9* 75.3* 76.8* 77.7*  PLT 399 326 294 336 328   Cardiac Enzymes: Recent Labs  Lab 11/14/17 1311 11/16/17 1851  TROPONINI <0.03 <0.03   BNP: Invalid input(s): POCBNP CBG: Recent Labs  Lab 11/18/17 0411 11/18/17 0803 11/19/17 0521 11/19/17 0744 11/19/17 1135  GLUCAP 81 105* 84 90 90   D-Dimer No results for input(s): DDIMER in the last 72 hours. Hgb A1c No results for input(s): HGBA1C in the last 72 hours. Lipid Profile No results for input(s): CHOL, HDL, LDLCALC, TRIG, CHOLHDL, LDLDIRECT in the last 72 hours. Thyroid function studies No results for input(s): TSH, T4TOTAL, T3FREE, THYROIDAB in the last 72 hours.  Invalid input(s): FREET3 Anemia work up No results for input(s): VITAMINB12, FOLATE, FERRITIN, TIBC, IRON, RETICCTPCT in the last 72 hours. Microbiology Recent Results (from the past 240 hour(s))  Culture, body fluid-bottle     Status: None   Collection Time: 11/12/17  2:13 PM  Result Value Ref Range Status   Specimen Description PLEURAL  Final    Special Requests LEFT  Final   Culture NO GROWTH 5 DAYS  Final   Report Status 11/17/2017 FINAL  Final  Gram stain     Status: None   Collection Time: 11/12/17  2:13 PM  Result Value Ref Range Status  Specimen Description PLEURAL  Final   Special Requests LEFT  Final   Gram Stain   Final    WBC PRESENT, PREDOMINANTLY PMN NO ORGANISMS SEEN CYTOSPIN SMEAR    Report Status 11/13/2017 FINAL  Final  Culture, blood (routine x 2)     Status: None   Collection Time: 11/14/17 12:58 PM  Result Value Ref Range Status   Specimen Description BLOOD RIGHT ANTECUBITAL  Final   Special Requests   Final    BOTTLES DRAWN AEROBIC ONLY Blood Culture results may not be optimal due to an excessive volume of blood received in culture bottles   Culture NO GROWTH 5 DAYS  Final   Report Status 11/19/2017 FINAL  Final  Culture, blood (routine x 2)     Status: None   Collection Time: 11/14/17  1:14 PM  Result Value Ref Range Status   Specimen Description BLOOD RIGHT ARM  Final   Special Requests IN PEDIATRIC BOTTLE Blood Culture adequate volume  Final   Culture NO GROWTH 5 DAYS  Final   Report Status 11/19/2017 FINAL  Final  MRSA PCR Screening     Status: Abnormal   Collection Time: 11/15/17  2:41 PM  Result Value Ref Range Status   MRSA by PCR POSITIVE (A) NEGATIVE Final    Comment:        The GeneXpert MRSA Assay (FDA approved for NASAL specimens only), is one component of a comprehensive MRSA colonization surveillance program. It is not intended to diagnose MRSA infection nor to guide or monitor treatment for MRSA infections. RESULT CALLED TO, READ BACK BY AND VERIFIED WITH: Jacklynn Barnacle RN 16:05 11/15/17 ([email or web address])    PATHOLOGY   EXPLORATORY LAPAROTOMY WITH HARTMANN PROCEDURE 11/14/17 Diagnosis Colon, segmental resection for tumor, Descending and Sigmoid - INVASIVE ADENOCARCINOMA, MODERATELY DIFFERENTIATED, SPANNING 4.3 CM. - ADENOCARCINOMA IS INVOLVED WITH TRANSMURAL DEFECT AND IS AT INKED  SEROSAL EDGE. - METASTATIC CARCINOMA IN 2 OF 5 LYMPH NODES (2/5) WITH EXTRACAPSULAR EXTENSION. - MULTIPLE SOFT TISSUE DEPOSITS. - THE PROXIMAL AND DISTAL RESECTION MARGINS ARE NEGATIVE FOR ADENOCARCINOMA. - SEE ONCOLOGY TABLE BELOW. Microscopic Comment COLON AND RECTUM (INCLUDING TRANS-ANAL RESECTION): Specimen: Descending and sigmoid colon. Procedure: Resection. Tumor site: Mid portion. Specimen integrity: Perforated. Macroscopic tumor perforation: Present. Invasive tumor: Maximum size: 4.3 cm. Histologic type(s): Adenocarcinoma. Histologic grade and differentiation: G2: moderately differentiated/low grade Type of polyp in which invasive carcinoma arose: Tubular adenoma. Microscopic extension of invasive tumor: Adenocarcinoma extends into pericolonic soft tissue and involves inked serosa. Lymph-Vascular invasion: Not identified. Peri-neural invasion: Present. Tumor deposit(s) (discontinuous extramural extension): Present. Resection margins: 8.0 cm to the closest margin and 16.0 cm to the opposite margin. Of note, carcinoma involves inked serosa. Treatment effect (neo-adjuvant therapy): N/A. Additional polyp(s): Not identified. Non-neoplastic findings: No significant findings. 1 of 2 FINAL for Roberto Owen, Roberto Owen ([id number]) Microscopic Comment(continued) Lymph nodes: number examined 5; number positive: 2. Pathologic Staging: pT4a, pN1b, pMX. Ancillary studies: A block (block 20F) will be sent for MSI by PCR and a separate block (1E) will be sent for MMR by IHC. Peri-colonic adipose tissue was cleared to locate additional lymph nodes. (JBK:ah 11/18/17)   Studies:  Dg Swallowing Func-speech Pathology  Result Date: 11/19/2017 Objective Swallowing Evaluation: Type of Study: Bedside Swallow Evaluation  Patient Details Name: Roberto Owen MRN: [id number] Date of Birth: 1935-03-29 Today's Date: 11/19/2017 Time: SLP Start Time (ACUTE ONLY): 1158 -SLP Stop Time (ACUTE ONLY): 4854 SLP  Time Calculation (min) (ACUTE ONLY): 16 min Past Medical History: Past Medical  History: Diagnosis Date . Anemia  . Bladder diverticulum 11/10/2017 . Esophageal ulcer   hx/notes 09/15/2017 . Gastric ulcer   hx/notes 09/15/2017 . GI bleed  . History of blood transfusion 07/2017 . NSAID induced gastritis  Past Surgical History: Past Surgical History: Procedure Laterality Date . COLONOSCOPY  ~ 05/2016 . ESOPHAGOGASTRODUODENOSCOPY (EGD) WITH PROPOFOL N/A 07/25/2017  Procedure: ESOPHAGOGASTRODUODENOSCOPY (EGD) WITH PROPOFOL;  Surgeon: Mauri Pole, MD;  Location: [facility];  Service: Endoscopy;  Laterality: N/A; . IR CATHETER TUBE CHANGE  10/28/2017 . IR RADIOLOGIST EVAL & MGMT  09/30/2017 . IR RADIOLOGIST EVAL & MGMT  10/19/2017 . LAPAROTOMY N/A 11/14/2017  Procedure: EXPLORATORY LAPAROTOMY WITH HARTMANN PROCEDURE;  Surgeon: Clovis Riley, MD;  Location: [facility];  Service: General;  Laterality: N/A; HPI: Pt is an 82 year old male with a known peptic ulcer disease, history of GI bleed, diverticulosis who presents with draining around indwelling catheter secondary to diverticulitis with abscess who was brought to the ED with increasing weight loss, GI spasms, cough plus leaking around his diverticular catheter site. Pt now s/p ex lap with partial sigmoid colectomy on 11/25. Of note, pt was intubated from 11/25-11/28.  Subjective: pt alert but not a great historian, provides inconsistent descriptions Assessment / Plan / Recommendation CHL IP CLINICAL IMPRESSIONS 11/19/2017 Clinical Impression -- SLP Visit Diagnosis -- Attention and concentration deficit following -- Frontal lobe and executive function deficit following -- Impact on safety and function (No Data)   CHL IP TREATMENT RECOMMENDATION 11/19/2017 Treatment Recommendations Therapy as outlined in treatment plan below   Prognosis 11/19/2017 Prognosis for Safe Diet Advancement Good Barriers to Reach Goals Cognitive deficits Barriers/Prognosis Comment -- CHL IP  DIET RECOMMENDATION 11/19/2017 SLP Diet Recommendations Dysphagia 2 (Fine chop) solids;Thin liquid Liquid Administration via Cup;No straw Medication Administration Whole meds with puree Compensations Small sips/bites;Minimize environmental distractions;Slow rate;Clear throat intermittently Postural Changes Seated upright at 90 degrees   CHL IP OTHER RECOMMENDATIONS 11/19/2017 Recommended Consults -- Oral Care Recommendations Oral care BID Other Recommendations --   CHL IP FOLLOW UP RECOMMENDATIONS 11/19/2017 Follow up Recommendations (No Data)   CHL IP FREQUENCY AND DURATION 11/19/2017 Speech Therapy Frequency (ACUTE ONLY) min 2x/week Treatment Duration 2 weeks      CHL IP ORAL PHASE 11/19/2017 Oral Phase Impaired Oral - Pudding Teaspoon -- Oral - Pudding Cup -- Oral - Honey Teaspoon -- Oral - Honey Cup -- Oral - Nectar Teaspoon -- Oral - Nectar Cup -- Oral - Nectar Straw -- Oral - Thin Teaspoon -- Oral - Thin Cup Delayed oral transit;Premature spillage Oral - Thin Straw Delayed oral transit Oral - Puree Delayed oral transit Oral - Mech Soft -- Oral - Regular Delayed oral transit Oral - Multi-Consistency -- Oral - Pill -- Oral Phase - Comment --  CHL IP PHARYNGEAL PHASE 11/19/2017 Pharyngeal Phase Impaired Pharyngeal- Pudding Teaspoon -- Pharyngeal -- Pharyngeal- Pudding Cup -- Pharyngeal -- Pharyngeal- Honey Teaspoon -- Pharyngeal -- Pharyngeal- Honey Cup -- Pharyngeal -- Pharyngeal- Nectar Teaspoon -- Pharyngeal -- Pharyngeal- Nectar Cup -- Pharyngeal -- Pharyngeal- Nectar Straw -- Pharyngeal -- Pharyngeal- Thin Teaspoon -- Pharyngeal -- Pharyngeal- Thin Cup Penetration/Aspiration during swallow;Reduced airway/laryngeal closure;Penetration/Aspiration before swallow;Pharyngeal residue - pyriform Pharyngeal Material enters airway, remains ABOVE vocal cords and not ejected out Pharyngeal- Thin Straw Penetration/Aspiration during swallow;Reduced airway/laryngeal closure Pharyngeal Material enters airway, remains ABOVE  vocal cords and not ejected out Pharyngeal- Puree WFL Pharyngeal -- Pharyngeal- Mechanical Soft -- Pharyngeal -- Pharyngeal- Regular WFL Pharyngeal -- Pharyngeal- Multi-consistency -- Pharyngeal -- Pharyngeal- Pill --  Pharyngeal -- Pharyngeal Comment --  CHL IP CERVICAL ESOPHAGEAL PHASE 11/19/2017 Cervical Esophageal Phase (No Data) Pudding Teaspoon -- Pudding Cup -- Honey Teaspoon -- Honey Cup -- Nectar Teaspoon -- Nectar Cup -- Nectar Straw -- Thin Teaspoon -- Thin Cup -- Thin Straw -- Puree -- Mechanical Soft -- Regular -- Multi-consistency -- Pill -- Cervical Esophageal Comment -- No flowsheet data found. Houston Siren 11/19/2017, 1:24 PM               Assessment: 82 y.o. male with known pepcid ulcer disease, history of GI Bleeding and Diverticulosis. Presented to ED on 11/08/17 for abdominal pain from draining abscess secondary to diverticulosis, vomiting and weight loss.    1. Adenocarcinoma of sigmoid colon, with perforation, pT4aN1cMx, s/p ex lap and colostomy (Hartmann's Procedure): 2. iron deficient anemia  3.  Loculated left pleural effusion 4.  Atrial fibrillation 5.  Small subdural hematoma 6. AKI 7.  Severe protein and calorie malnutrition   RECOMMENDATIONS -I have reviewed his pathology findings, CT scan images, and his hospital course -Although no definitive evidence of metastasis on his CT scan, giving T4a lesion, perforated tumor, multiple soft tissue deposits on the surgical sample, he is at extremely high risk for recurrence. -His left pleural effusion cytology was not sent.  -I may consider a PET scan as outpatient, for further evaluation of his metastatic disease -I plan to meet patient and his daughter again tomorrow, discuss the overall prognosis, outpatient workup, and treatment options. -Due to his advanced age, multiple comorbidities, he may not be a good candidate for intensive chemotherapy, but I would consider single agent chemo such as 5-FU as adjuvant  chemo if he recovers well, and his renal functions improves  -will also check his tumor MSI  -I recommend IV feraheme 536m once when he is in hospital for his iron deficient anemia, and discharge with oral iron -I will see him back in a few weeks after his discharge   FTruitt Merle 11/19/2017   Addendum I met with patient and his daughter around noon today. We discussed his diagnosis, staging, outpt workup including a PET scan, and role of adjuvant chemo. All questions were answered.  Plan to see him back in a few weeks after hospital discharge.   FTruitt Merle 11/20/2017

## 2017-11-19 NOTE — Progress Notes (Signed)
Did not get wound vac dressing change completed on my shift. Relayed to night team. Raquel Sarna RN will complete dressing change.

## 2017-11-19 NOTE — Progress Notes (Signed)
Physical Therapy Treatment Patient Details Name: Roberto Owen MRN: [id number] DOB: 03/27/1935 Today's Date: 11/19/2017    History of Present Illness Pt is an 82 year old male with a known peptic ulcer disease, history of GI bleed, diverticulosis who presents with draining around indwelling catheter secondary to diverticulitis with abscess who was brought to the ED with increasing weight loss, GI spasms, cough plus leaking around his diverticular catheter site. Pt now s/p ex lap with partial sigmoid colectomy on 11/25. Of note, pt was intubated from 11/25-11/28.    PT Comments     Pt very motivated for pt this session. Pt eager to do bed exercises before sitting EOB. MinA-ModA for bed mobility. Once EOB pt min guard for safety. Frequent cues to lift head up. Min-mod A for stand pivot transfer. Required more assistance by end of session during sit to stand transfer from recliner. Was able to take a couple steps to side of bed Mod A +2 person HHA. Pt fatigued throughout session. Pt will continue to benefit from therapy due to pt decrease in endurance and assistance needed limited activity.     Follow Up Recommendations  SNF;Supervision/Assistance - 24 hour     Equipment Recommendations  Other (comment)(TBD at next venue)    Recommendations for Other Services       Precautions / Restrictions Precautions Precautions: Fall Precaution Comments: NG tube, L UQ colostomy, chest tube, abdominal wound VAC Restrictions Weight Bearing Restrictions: No    Mobility  Bed Mobility Overal bed mobility: Needs Assistance Bed Mobility: Supine to Sit     Supine to sit: Min assist;HOB elevated Sit to supine: Min assist;Mod assist;+2 for physical assistance;HOB elevated   General bed mobility comments: increased time and effort for trunk and LE management from supine to sit. Pt able to use rails. Min A-Mod A for LE management off of bed.  Mod A +2 for positioning and LE management into bed during  sit to supine.   Transfers Overall transfer level: Needs assistance Equipment used: 2 person hand held assist Transfers: Sit to/from [facility] Sit to Stand: +2 physical assistance;Mod assist(pt Mod A for sit to stand transfer from recliner due to increased fatigue. Pt with forward lean. Frequent cueing for erect posture. Pt able to take a couple steps during transfer from recliner to bed. required less assist when standing upright.) Stand pivot transfers: +2 physical assistance;Min assist;Mod assist       General transfer comment: Vc needed for hand placement and power up during stand pivot tranfer. Pt with carryover for proper hand placement during sit to stand transfer recliner>bed  Ambulation/Gait                 Stairs            Wheelchair Mobility    Modified Rankin (Stroke Patients Only)       Balance Overall balance assessment: Needs assistance Sitting-balance support: Feet supported;Single extremity supported Sitting balance-Leahy Scale: Fair Sitting balance - Comments: Pt able to sit EOB. VC for erect posture and mod cueing to lift head. Min guard for safety   Standing balance support: Bilateral upper extremity supported Standing balance-Leahy Scale: Poor Standing balance comment: pt dependent on 2 person hand held assist to stand. decrease assist once cued to stand upright.                            Cognition Arousal/Alertness: Awake/alert Behavior During Therapy: WFL for tasks assessed/performed  Overall Cognitive Status: Within Functional Limits for tasks assessed                                        Exercises General Exercises - Lower Extremity Long Arc Quad: AROM;15 reps;Seated Heel Slides: AROM;Both;10 reps;Supine Straight Leg Raises: AROM;Both;10 reps;Supine Hip Flexion/Marching: AROM;10 reps;Both;Seated Toe Raises: Both;AROM;Seated;10 reps Heel Raises: AROM;Both;Seated;10 reps Other  Exercises Other Exercises: towel squeeze 10 reps 5 sec holds    General Comments        Pertinent Vitals/Pain Pain Assessment: No/denies pain    Home Living                      Prior Function            PT Goals (current goals can now be found in the care plan section) Acute Rehab PT Goals Patient Stated Goal: not discussed Progress towards PT goals: Progressing toward goals    Frequency    Min 3X/week      PT Plan Current plan remains appropriate    Co-evaluation              AM-PAC PT "6 Clicks" Daily Activity  Outcome Measure  Difficulty turning over in bed (including adjusting bedclothes, sheets and blankets)?: Unable Difficulty moving from lying on back to sitting on the side of the bed? : Unable Difficulty sitting down on and standing up from a chair with arms (e.g., wheelchair, bedside commode, etc,.)?: Unable Help needed moving to and from a bed to chair (including a wheelchair)?: A Lot Help needed walking in hospital room?: A Lot Help needed climbing 3-5 steps with a railing? : Total 6 Click Score: 7    End of Session Equipment Utilized During Treatment: Gait belt Activity Tolerance: Patient tolerated treatment well Patient left: in bed;with nursing/sitter in room Nurse Communication: Mobility status PT Visit Diagnosis: Unsteadiness on feet (R26.81);Muscle weakness (generalized) (M62.81)     Time: 2876-8115 PT Time Calculation (min) (ACUTE ONLY): 40 min  Charges:  $Therapeutic Exercise: 8-22 mins $Therapeutic Activity: 23-37 mins                    G Codes:  Functional Assessment Tool Used: AM-PAC 6 Clicks Basic Mobility    Fransisca Connors, SPTA    Fransisca Connors 11/19/2017, 4:30 PM

## 2017-11-19 NOTE — Progress Notes (Signed)
SLP spoke to Dr Olene Floss who reports patient may proceed with MBS today.  MBS tentatively planned for 1130 am.  Full report to follow.  Thanks for clarifying approval.  Luanna Salk, [place] [facility] SLP [phone]

## 2017-11-19 NOTE — Progress Notes (Signed)
Family Medicine Teaching Service Daily Progress Note Intern Pager: [phone]  Patient name: Roberto Owen Medical record number: [id number] Date of birth: [birth year] Age: 82 y.o. Gender: male  Primary Care Provider: Patient, No Pcp Per Consultants: Surgery, IR, Cardiology Code Status: FULL  Pt Overview and Major Events to Date:  11/19 - admitted for GI spasms, LOA 11/22 - allowed to eat liquid diet per Surgery 11/23 - Left chest tube placement by IR  Assessment and Plan: Roberto Owen is a 82 y.o. male presenting with an in place abdominal drain due to previous episode of diverticulitis with abscess of the sigmoid colon, developed bowel perforation s/p ex-lap and coloectomy with colostomy placed and found to have colorectal carcinoma. PMH is significant for Peptic ulcer disease, history of GI bleed and previous episode of diverticulosis.  Perforated viscus s/p ex lap and colostomy (Hartmann's Procedure):  He is POD#5. Patient has been transferred back to our service after good management by Critical Care. He denies any pain now, colostomy bag in place and producing liquid brown stool. - Surgery following, advancing diet as tolerated after speech therapy has cleared. - Per Surgery, Vanc 1g for 3 doses every 48hrs started on 11/29. Zosyn 3.375g q8 for 14 days, started on 11/20. - tylenol PRN - zofran PRN - D5NS at 148mL/hr - NG tube in place; NPO at the moment. - Swallow-speech function eval - Modified barium swallow, if pass advance diet  Colorectal Carcinoma: Patient was found to have colorectal carcinoma after ex lap and colostomy. Hematology and GI oncology on board. Pathology showed T4N1 perforated adenocarcinoma. - Patient will be seen by Dr. Burr Medico with GI Oncology today for further - Hematology also on board  Loculated Left Pleural Effusion: Chest tube still in place and draining well. CT chest obtained 11/21 and showed bilateral loculated pleural effusions. WBC at 10.1.  Pleural culture negative, gram stain no growth final. - Chest tube in place since 11/23. - Vanc/Zosyn for HCAP coverage overlaps with coverage from ex-lap and colectomy with colostomy. - Continuous pulse ox - consult IR on when to remove pigtail catheter  New Onset Afib now with bradycardia: Bradycardia resolved. He has no complaints of chest pain or palpitations and heat rate is now well controlled with oral amiodarone. Cardiology has signed off and will follow up outpatient.  - Stopped Heparin due to small subdural hematomas found; neuro recs appreciated - Transitioned to amiodarone 200mg  BID, per Cards; anticipate reducing to daily in several days, per Cards - Metoprolol decreased to 12.5 mg BID from 25 mg BID 11/22, per cards for lower heart rates  - Consult cards on Amiodarone  Small Subdural Hematoma: CT Head on right convexity 3.9 mm subdural hematoma without change on 11/26. MRI also on 11/27  showed no acute changes, stable 3-27mm right cerebral convexity subdural hematoma. - Holding heparin now. Consult Neuro, appreciate recs  Hypoalbuminemia due to Severe protein-calorie malnutrition ([place]): - Per Nutrition started Osmolite 1.5 ml/hr via cortrak tube; increasing by 29ml every 4 hours to goal of 66ml/hr. appreciate recs. - Per Nutrition 4ml Prostat TID.  - Awaiting clearance for diet  AKI: Worsened from 1.04 to 2.26 from the 23rd to the 25th. Creatinine since has been slowly stabilized and now at 2.32 (11/30).  1.91 baseline of  ~0.8. Renal U/S showed bilateral non-obstructive nephrolithasis.Calculi measure on the order of 8 mm each, one seen in the lower the right kidney and the second in the upper pole of left kidney. - Continue IVFs - Continue  to follow BMP for resolution of AKI  Hypokalemia: 3.3 this morning.  - Replenish Potassium with 29mEq IV today   Anemia: Hemoglobin stable at 8.7, BL ~ 9.0.  No signs of overt bleeding. - Continue to monitor with CBC  FEN/GI:  Protonix, NPO for chest tube placement 11/23 then order full liquid diet Prophylaxis: Heparin (stopping for now)  Disposition: continue inpatient management of GI spasms  Subjective:  Patient feels well. He is up to date with his diagnosis of colorectal carcinoma and he says he is in "good spirits" and "I'm going to keep going". Patient attitude and mood is extremely positive given his recent health and diagnosis. He denies any abdominal pain and he states he has no complaints at this time.  He denies feeling feverish, chills, headache, chest pain, SOB, cough, abdominal pain, nausea, vomiting.  Objective: Temp:  [97.8 F (36.6 C)-98.6 F (37 C)] 98.6 F (37 C) (11/30 0441) Pulse Rate:  [28-84] 66 (11/30 0441) Resp:  [18-23] 18 (11/30 0441) BP: (140-162)/(48-81) 140/58 (11/30 0441) SpO2:  [99 %-100 %] 100 % (11/30 0441) Weight:  [223 lb 5.2 oz (101.3 kg)] 223 lb 5.2 oz (101.3 kg) (11/30 0441)  Physical Exam: Gen: Alert and Oriented x 3, NAD HEENT: Normocephalic, atraumatic, PERRLA, EOMI, NG tube in place CV: Irregular rhythm, normal rate, no murmurs, normal S1, S2 split, +2 pulses dorsalis pedis bilaterally Resp: CTAB, no wheezing, rales, or rhonchi, comfortable work of breathing Abd: non-distended, non-tender, soft, +bs in all four quadrants,colostomy in place in LUQ, midline incision dressed, not warm, erythematous, no discharge with drain in place. MSK: FROM in all four extremities Ext: no clubbing, cyanosis, trace edema in hand bilaterally Neuro: CN II-XII intact, no focal or gross deficits Skin: warm, dry, intact, no rashes  Laboratory: Recent Labs  Lab 11/17/17 0447 11/18/17 0504 11/19/17 0506  WBC 21.2* 14.1* 10.1  HGB 8.6* 8.8* 8.7*  HCT 25.6* 27.4* 27.2*  PLT 294 336 328   Recent Labs  Lab 11/14/17 1311  11/17/17 0447 11/18/17 0504 11/19/17 0800  NA 136   < > 141 144 145  K 2.9*   < > 3.5 3.3* 3.9  CL 107   < > 117* 121* 122*  CO2 22   < > 19* 19* 18*  BUN  18   < > 23* 22* 24*  CREATININE 2.26*   < > 2.45* 2.32* 2.27*  CALCIUM 7.1*   < > 7.1* 7.2* 7.4*  PROT 5.7*  --   --   --   --   BILITOT 0.7  --   --   --   --   ALKPHOS 61  --   --   --   --   ALT 11*  --   --   --   --   AST 21  --   --   --   --   GLUCOSE 86   < > 124* 124* 94   < > = values in this interval not displayed.   11/19 - Lipase: 19 11/19 - Lactic Acid: 1.52, 1.15 11/20 - Troponin I: 0.03,  11/20 - TSH: 0.271 11/20 - T3: 0.9; T4: 0.98 11/23 - Pleural Cx: NG for 5 days; gram stain normal 11/25 - BCx: NGTD >48hrs 11/28 - Mag: 1.8, Phos: 2.8  Imaging/Diagnostic Tests:  Dg Chest 2 View  Result Date: 11/08/2017 IMPRESSION: Mild left basilar subsegmental atelectasis or infiltrate is noted. Followup PA and lateral chest X-ray is recommended in  3-4 weeks following trial of antibiotic therapy to ensure resolution and exclude underlying malignancy. Electronically Signed   By: Marijo Conception, M.D.   On: 11/08/2017 16:46   Ct Chest W Contrast  Result Date: 11/10/2017 IMPRESSION: 1. Loculated bilateral pleural effusions, left greater than right. 2. Associated airspace disease likely reflects atelectasis. 3. Additional pleural irregularity on the left may represent infection associated with the fusion. 4. 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 Dextroconvex curvature is associated. Electronically Signed   By: San Morelle M.D.   On: 11/10/2017 15:19   Ct Abdomen Pelvis W Contrast  Result Date: 11/08/2017 IMPRESSION: 1. The position of the left abdominal percutaneous pigtail catheter appears stable since October. No residual abscess but continued and perhaps progressive circumferential colonic wall thickening and mesenteric inflammation near the catheter. 2. Increased low-density stool distending the upstream left colon and splenic flexure raising the possibility of partial obstruction. 3. Consider Colonoscopy: The persistent colonic wall  thickening in #1 may be inflammatory but colonic tumor is difficult to exclude. 4. New since October small to moderate size loculated left pleural effusion. Associated lung base atelectasis without definite pneumonia. Electronically Signed   By: Genevie Ann M.D.   On: 11/08/2017 15:30   CT Perc Pleural Drain Placement: 11/12/2017 CT-guided placement of a chest tube within the loculated left pleural effusion.  CT Head: 11/15/2017 IMPRESSION: Right convexity 3.9 mm subdural hematoma without change. No significant associated mass effect. No new intracranial hemorrhage. No CT evidence of large acute infarct or diffuse anoxia. Atrophy.  Renal U/S:  11/15/2017 IMPRESSION: 1. Bilateral nephrolithiasis without obstructive uropathy. Calculi measure on the order of 8 mm each, one seen in the lower the right kidney and the second in the upper pole of left kidney. 2. No loss of cortical-medullary distinction of either kidney. 3. Foley decompressed urinary bladder. 4. Small amount of perihepatic free fluid.  MRI Brain: 11/16/2017 IMPRESSION: 1. No new acute intracranial abnormality. 2. Stable 3-4 mm right cerebral convexity subdural hematoma. 3. Mild chronic microvascular ischemic changes and mild parenchymal volume loss of the brain. 4. 4 mm right lateral ventricle nodule on under belly of corpus callosum, likely benign. Comparison with prior MRI imaging where available is recommended. Otherwise if clinically indicated consider 6-12 month follow-up to evaluate for stability.  CXR: 11/28 IMPRESSION: Stable support devices.  No pneumothorax. Left base atelectasis, improving since prior study.  Nuala Alpha, DO 11/19/2017, 7:12 AM PGY-1, [place] Intern pager: [phone], text pages welcome

## 2017-11-19 NOTE — Progress Notes (Signed)
Spoke with patient's Cardiologist Dr. Terrence Dupont. He recommends decreasing amiodarone to 200 mg once daily from BID and to maintain this dose until he can follow up with him in the outpatient setting.  Olene Floss, MD [place], PGY-3

## 2017-11-19 NOTE — Progress Notes (Signed)
5 Days Post-Op   Subjective/Chief Complaint: Awake alert no issues   Objective: Vital signs in last 24 hours: Temp:  [97.8 F (36.6 C)-98.6 F (37 C)] 98.6 F (37 C) (11/30 0746) Pulse Rate:  [28-84] 70 (11/30 0746) Resp:  [18-23] 18 (11/30 0746) BP: (140-162)/(48-81) 141/64 (11/30 0746) SpO2:  [99 %-100 %] 100 % (11/30 0746) Weight:  [101.3 kg (223 lb 5.2 oz)] 101.3 kg (223 lb 5.2 oz) (11/30 0441) Last BM Date: 11/18/17  Intake/Output from previous day: 11/29 0701 - 11/30 0700 In: 7578.8 [I.V.:6375; NG/GT:1003.8; IV Piggyback:200] Out: 2896 [Urine:1600; Stool:1250; Chest Tube:46] Intake/Output this shift: No intake/output data recorded.  GI: vac in place ,soft bs present stoma pink and functional  Lab Results:  Recent Labs    11/18/17 0504 11/19/17 0506  WBC 14.1* 10.1  HGB 8.8* 8.7*  HCT 27.4* 27.2*  PLT 336 328   BMET Recent Labs    11/17/17 0447 11/18/17 0504  NA 141 144  K 3.5 3.3*  CL 117* 121*  CO2 19* 19*  GLUCOSE 124* 124*  BUN 23* 22*  CREATININE 2.45* 2.32*  CALCIUM 7.1* 7.2*   PT/INR No results for input(s): LABPROT, INR in the last 72 hours. ABG No results for input(s): PHART, HCO3 in the last 72 hours.  Invalid input(s): PCO2, PO2  Studies/Results: No results found.  Anti-infectives: Anti-infectives (From admission, onward)   Start     Dose/Rate Route Frequency Ordered Stop   11/18/17 1800  vancomycin (VANCOCIN) IVPB 1000 mg/200 mL premix     1,000 mg 200 mL/hr over 60 Minutes Intravenous Every 48 hours 11/17/17 1419 11/22/17 2359   11/15/17 0515  vancomycin (VANCOCIN) 1,250 mg in sodium chloride 0.9 % 250 mL IVPB  Status:  Discontinued     1,250 mg 166.7 mL/hr over 90 Minutes Intravenous Every 24 hours 11/15/17 0502 11/15/17 1746   11/14/17 2315  vancomycin (VANCOCIN) IVPB 750 mg/150 ml premix  Status:  Discontinued     750 mg 150 mL/hr over 60 Minutes Intravenous Every 12 hours 11/14/17 2306 11/14/17 2309   11/13/17 1600   vancomycin (VANCOCIN) IVPB 750 mg/150 ml premix  Status:  Discontinued     750 mg 150 mL/hr over 60 Minutes Intravenous Every 12 hours 11/13/17 1057 11/14/17 2306   11/11/17 1600  vancomycin (VANCOCIN) IVPB 1000 mg/200 mL premix  Status:  Discontinued     1,000 mg 200 mL/hr over 60 Minutes Intravenous Every 12 hours 11/11/17 0443 11/13/17 1057   11/09/17 1600  vancomycin (VANCOCIN) IVPB 750 mg/150 ml premix  Status:  Discontinued     750 mg 150 mL/hr over 60 Minutes Intravenous Every 12 hours 11/09/17 1500 11/11/17 0443   11/09/17 0800  vancomycin (VANCOCIN) IVPB 750 mg/150 ml premix  Status:  Discontinued     750 mg 150 mL/hr over 60 Minutes Intravenous Every 12 hours 11/08/17 2152 11/09/17 1500   11/09/17 0200  piperacillin-tazobactam (ZOSYN) IVPB 3.375 g     3.375 g 12.5 mL/hr over 240 Minutes Intravenous Every 8 hours 11/08/17 1821 11/22/17 2359   11/08/17 2200  vancomycin (VANCOCIN) 2,000 mg in sodium chloride 0.9 % 500 mL IVPB     2,000 mg 250 mL/hr over 120 Minutes Intravenous  Once 11/08/17 2152 11/09/17 0020   11/08/17 1830  piperacillin-tazobactam (ZOSYN) IVPB 3.375 g     3.375 g 100 mL/hr over 30 Minutes Intravenous  Once 11/08/17 1821 11/08/17 2134   11/08/17 1745  piperacillin-tazobactam (ZOSYN) IVPB 3.375 g  Status:  Discontinued     3.375 g 100 mL/hr over 30 Minutes Intravenous  Once 11/08/17 1733 11/08/17 1734      Assessment/Plan: POD #5 hartmanns- Dr Kae Heller -continue tube feeds -can have diet when cleared by speech therapy -vac changes T/TH/Sat -Dr Kae Heller discussed path, patient will have oncology follow up per Dr Starleen Arms note -can use pharm dvt proph when ok from sdh standpoint   Rolm Bookbinder 11/19/2017

## 2017-11-19 NOTE — Progress Notes (Signed)
UPDATE: Dr Burr Medico from our GI oncology group will see the patient in the hospital today, 11/30; we are cancelling the 12/10 visit

## 2017-11-19 NOTE — Progress Notes (Signed)
Modified Barium Swallow Progress Note  Patient Details  Name: Roberto Owen MRN: [id number] Date of Birth: [birth year]  Today's Date: 11/19/2017  Modified Barium Swallow completed.  Full report located under Chart Review in the Imaging Section.  Brief recommendations include the following:  Clinical Impression  Pt exhibited mild-moderate oropharyngeal dysphagia with decreased epiglottic deflection impacted by large bore NGT. Prolonged oral manipulation and transit and premature spill (x 1). Mild and intermittent penetration before and during swallow with cup and straw thin with mild pyriform sinus residue. Majority of penetrates expelled from vestibule during swallow with subsequent swallows. Esophageal scan did not reveal overt abnormalities although not diagnosed during MBS. Recommending Dys 2 texture, thin liquids, NO straw, pills whole in applesauce and sit upright. Safety with po's will increase once large bore NGT removed. ST will continue to see for safety and upgrade in texture when appropriate   Swallow Evaluation Recommendations       SLP Diet Recommendations: Dysphagia 2 (Fine chop) solids;Thin liquid   Liquid Administration via: Cup;No straw   Medication Administration: Whole meds with puree   Supervision: Patient able to self feed;Full supervision/cueing for compensatory strategies   Compensations: Small sips/bites;Minimize environmental distractions;Slow rate;Clear throat intermittently   Postural Changes: Seated upright at 90 degrees   Oral Care Recommendations: Oral care BID        Houston Siren 11/19/2017,1:25 PM   Orbie Pyo [place] [phone]

## 2017-11-20 ENCOUNTER — Inpatient Hospital Stay (HOSPITAL_COMMUNITY): Payer: Medicare Other

## 2017-11-20 LAB — GLUCOSE, CAPILLARY
GLUCOSE-CAPILLARY: 44 mg/dL — AB (ref 65–99)
GLUCOSE-CAPILLARY: 56 mg/dL — AB (ref 65–99)
GLUCOSE-CAPILLARY: 75 mg/dL (ref 65–99)
GLUCOSE-CAPILLARY: 79 mg/dL (ref 65–99)
GLUCOSE-CAPILLARY: 91 mg/dL (ref 65–99)
Glucose-Capillary: 37 mg/dL — CL (ref 65–99)
Glucose-Capillary: 58 mg/dL — ABNORMAL LOW (ref 65–99)
Glucose-Capillary: 30 mg/dL — CL (ref 65–99)
Glucose-Capillary: 68 mg/dL (ref 65–99)

## 2017-11-20 LAB — COMPREHENSIVE METABOLIC PANEL
ALBUMIN: 1.2 g/dL — AB (ref 3.5–5.0)
ALK PHOS: 51 U/L (ref 38–126)
ALT: 12 U/L — ABNORMAL LOW (ref 17–63)
ANION GAP: 5 (ref 5–15)
AST: 17 U/L (ref 15–41)
BILIRUBIN TOTAL: 0.4 mg/dL (ref 0.3–1.2)
BUN: 22 mg/dL — ABNORMAL HIGH (ref 6–20)
CALCIUM: 7.7 mg/dL — AB (ref 8.9–10.3)
CO2: 17 mmol/L — ABNORMAL LOW (ref 22–32)
Chloride: 122 mmol/L — ABNORMAL HIGH (ref 101–111)
Creatinine, Ser: 2.04 mg/dL — ABNORMAL HIGH (ref 0.61–1.24)
GFR calc Af Amer: 34 mL/min — ABNORMAL LOW (ref >60)
GFR, EST NON AFRICAN AMERICAN: 29 mL/min — AB (ref >60)
GLUCOSE: 79 mg/dL (ref 65–99)
Potassium: 4 mmol/L (ref 3.5–5.1)
Sodium: 144 mmol/L (ref 135–145)
TOTAL PROTEIN: 5.9 g/dL — AB (ref 6.5–8.1)

## 2017-11-20 LAB — CBC
HEMATOCRIT: 29.2 % — AB (ref 39.0–52.0)
HEMOGLOBIN: 9.3 g/dL — AB (ref 13.0–17.0)
MCH: 24.7 pg — AB (ref 26.0–34.0)
MCHC: 31.8 g/dL (ref 30.0–36.0)
MCV: 77.7 fL — AB (ref 78.0–100.0)
Platelets: 298 10*3/uL (ref 150–400)
RBC: 3.76 MIL/uL — ABNORMAL LOW (ref 4.22–5.81)
RDW: 19.5 % — AB (ref 11.5–15.5)
WBC: 8.8 10*3/uL (ref 4.0–10.5)

## 2017-11-20 LAB — GLUCOSE, RANDOM: Glucose, Bld: 111 mg/dL — ABNORMAL HIGH (ref 65–99)

## 2017-11-20 MED ORDER — DEXTROSE 50 % IV SOLN
INTRAVENOUS | Status: AC
  Administered 2017-11-20: 50 mL
  Filled 2017-11-20: qty 50

## 2017-11-20 MED ORDER — TRAMADOL HCL 50 MG PO TABS
50.0000 mg | ORAL_TABLET | Freq: Three times a day (TID) | ORAL | Status: DC | PRN
  Administered 2017-11-20 – 2017-11-25 (×7): 50 mg via ORAL
  Filled 2017-11-20 (×7): qty 1

## 2017-11-20 MED ORDER — DEXTROSE-NACL 5-0.9 % IV SOLN
INTRAVENOUS | Status: DC
  Administered 2017-11-20: 18:00:00 via INTRAVENOUS

## 2017-11-20 NOTE — Progress Notes (Signed)
Pharmacy Antibiotic Note  Roberto Owen is a 82 y.o. male admitted on 11/08/2017 with an intra-abdominal infection and sepsis due to perforated viscus.  Pharmacy has been consulted for Vancomycin and Zosyn dosing. Vancomycin was restarted on 11/29 at 1gm IV q48h after being held due to a high vancomycin random level.  Assessment: WBC down to 8.8 from 10.1, patient afebrile. Per surgery will continue Vanc and Zosyn until 12/3. Stop dates are in place.   Plan: - Continue vancomycin 1 gm IV q48h x 3 doses - stop date in for 12/3 - Continue Zosyn 3.375 gm IV q8h - stop date in for 12/3 - Monitor any changes over the next 2 days  Height: 6\' 6"  (198.1 cm) Weight: 220 lb 14.4 oz (100.2 kg) IBW/kg (Calculated) : 91.4  Temp (24hrs), Avg:98.5 F (36.9 C), Min:98.3 F (36.8 C), Max:98.6 F (37 C)  Recent Labs  Lab 11/14/17 1311 11/14/17 1531  11/15/17 1540 11/16/17 0351 11/17/17 0447 11/18/17 0504 11/19/17 0506 11/19/17 0800 11/20/17 0423  WBC 32.1*  --    < >  --  23.8* 21.2* 14.1* 10.1  --  8.8  CREATININE 2.26*  --    < >  --  2.42* 2.45* 2.32*  --  2.27* 2.04*  LATICACIDVEN 1.8 1.9  --   --   --   --   --   --   --   --   VANCOTROUGH  --   --   --  37*  --   --   --   --   --   --   VANCORANDOM  --   --    < >  --  34 25  --   --   --   --    < > = values in this interval not displayed.    Estimated Creatinine Clearance: 37.3 mL/min (A) (by C-G formula based on SCr of 2.04 mg/dL (H)).    No Known Allergies  Antimicrobials this admission: 11/20 Zosyn >> (12/3) 11/20 Vanc >> (12/3)  Dose adjustments this admission: 11/26 Vanc trough: 37 >> Hold >> 11/27 Vanc random: 34 >> Hold >> 11/28 Vanc random: 25 >> Hold >> restarted on 11/29 at 1 gm IV q48h  Microbiology results: 11/25 BCx: NGTD 2 days 11/23 Pleural Cx: NGTD 4 days 11/26 MRSA PCR: Positive  Thank you for allowing pharmacy to be a part of this patient's care.  Kadin Bera L. Lorry Furber, PharmD, AAHIVP,  CPP Infectious Diseases Clinical Pharmacist 11/20/2017, 1:20 PM

## 2017-11-20 NOTE — Progress Notes (Signed)
6 Days Post-Op   Subjective/Chief Complaint: Slept well last night    Objective: Vital signs in last 24 hours: Temp:  [98.5 F (36.9 C)-98.6 F (37 C)] 98.5 F (36.9 C) (12/01 0628) Pulse Rate:  [68-86] 86 (12/01 0628) Resp:  [18] 18 (12/01 0628) BP: (143-159)/(66-81) 147/81 (12/01 0628) SpO2:  [99 %-100 %] 100 % (12/01 0628) Weight:  [100.2 kg (220 lb 14.4 oz)] 100.2 kg (220 lb 14.4 oz) (12/01 0628) Last BM Date: 11/19/17  Intake/Output from previous day: 11/30 0701 - 12/01 0700 In: 2608.8 [P.O.:240; I.V.:2268.8; IV Piggyback:100] Out: 1715 [Urine:900; Drains:40; Stool:775] Intake/Output this shift: No intake/output data recorded.  Incision/Wound:vac in  Place  Ostomy functioning viable with slight retraction    Lab Results:  Recent Labs    11/19/17 0506 11/20/17 0423  WBC 10.1 8.8  HGB 8.7* 9.3*  HCT 27.2* 29.2*  PLT 328 298   BMET Recent Labs    11/19/17 0800 11/20/17 0423  NA 145 144  K 3.9 4.0  CL 122* 122*  CO2 18* 17*  GLUCOSE 94 79  BUN 24* 22*  CREATININE 2.27* 2.04*  CALCIUM 7.4* 7.7*   PT/INR No results for input(s): LABPROT, INR in the last 72 hours. ABG No results for input(s): PHART, HCO3 in the last 72 hours.  Invalid input(s): PCO2, PO2  Studies/Results: Dg Swallowing Func-speech Pathology  Result Date: 11/19/2017 Objective Swallowing Evaluation: Type of Study: Bedside Swallow Evaluation  Patient Details Name: Roberto Owen MRN: [id number] Date of Birth: [birth year] Today's Date: 11/19/2017 Time: SLP Start Time (ACUTE ONLY): 4628 -SLP Stop Time (ACUTE ONLY): 6381 SLP Time Calculation (min) (ACUTE ONLY): 16 min Past Medical History: Past Medical History: Diagnosis Date . Anemia  . Bladder diverticulum 11/10/2017 . Esophageal ulcer   hx/notes 09/15/2017 . Gastric ulcer   hx/notes 09/15/2017 . GI bleed  . History of blood transfusion 07/2017 . NSAID induced gastritis  Past Surgical History: Past Surgical History: Procedure Laterality Date .  COLONOSCOPY  ~ 05/2016 . ESOPHAGOGASTRODUODENOSCOPY (EGD) WITH PROPOFOL N/A 07/25/2017  Procedure: ESOPHAGOGASTRODUODENOSCOPY (EGD) WITH PROPOFOL;  Surgeon: Mauri Pole, MD;  Location: [facility];  Service: Endoscopy;  Laterality: N/A; . IR CATHETER TUBE CHANGE  10/28/2017 . IR RADIOLOGIST EVAL & MGMT  09/30/2017 . IR RADIOLOGIST EVAL & MGMT  10/19/2017 . LAPAROTOMY N/A 11/14/2017  Procedure: EXPLORATORY LAPAROTOMY WITH HARTMANN PROCEDURE;  Surgeon: Clovis Riley, MD;  Location: [facility] OR;  Service: General;  Laterality: N/A; HPI: Pt is an 82 year old male with a known peptic ulcer disease, history of GI bleed, diverticulosis who presents with draining around indwelling catheter secondary to diverticulitis with abscess who was brought to the ED with increasing weight loss, GI spasms, cough plus leaking around his diverticular catheter site. Pt now s/p ex lap with partial sigmoid colectomy on 11/25. Of note, pt was intubated from 11/25-11/28.  Subjective: pt alert but not a great historian, provides inconsistent descriptions Assessment / Plan / Recommendation CHL IP CLINICAL IMPRESSIONS 11/19/2017 Clinical Impression -- SLP Visit Diagnosis -- Attention and concentration deficit following -- Frontal lobe and executive function deficit following -- Impact on safety and function (No Data)   CHL IP TREATMENT RECOMMENDATION 11/19/2017 Treatment Recommendations Therapy as outlined in treatment plan below   Prognosis 11/19/2017 Prognosis for Safe Diet Advancement Good Barriers to Reach Goals Cognitive deficits Barriers/Prognosis Comment -- CHL IP DIET RECOMMENDATION 11/19/2017 SLP Diet Recommendations Dysphagia 2 (Fine chop) solids;Thin liquid Liquid Administration via Cup;No straw Medication Administration Whole meds with puree Compensations  Small sips/bites;Minimize environmental distractions;Slow rate;Clear throat intermittently Postural Changes Seated upright at 90 degrees   CHL IP OTHER RECOMMENDATIONS 11/19/2017  Recommended Consults -- Oral Care Recommendations Oral care BID Other Recommendations --   CHL IP FOLLOW UP RECOMMENDATIONS 11/19/2017 Follow up Recommendations (No Data)   CHL IP FREQUENCY AND DURATION 11/19/2017 Speech Therapy Frequency (ACUTE ONLY) min 2x/week Treatment Duration 2 weeks      CHL IP ORAL PHASE 11/19/2017 Oral Phase Impaired Oral - Pudding Teaspoon -- Oral - Pudding Cup -- Oral - Honey Teaspoon -- Oral - Honey Cup -- Oral - Nectar Teaspoon -- Oral - Nectar Cup -- Oral - Nectar Straw -- Oral - Thin Teaspoon -- Oral - Thin Cup Delayed oral transit;Premature spillage Oral - Thin Straw Delayed oral transit Oral - Puree Delayed oral transit Oral - Mech Soft -- Oral - Regular Delayed oral transit Oral - Multi-Consistency -- Oral - Pill -- Oral Phase - Comment --  CHL IP PHARYNGEAL PHASE 11/19/2017 Pharyngeal Phase Impaired Pharyngeal- Pudding Teaspoon -- Pharyngeal -- Pharyngeal- Pudding Cup -- Pharyngeal -- Pharyngeal- Honey Teaspoon -- Pharyngeal -- Pharyngeal- Honey Cup -- Pharyngeal -- Pharyngeal- Nectar Teaspoon -- Pharyngeal -- Pharyngeal- Nectar Cup -- Pharyngeal -- Pharyngeal- Nectar Straw -- Pharyngeal -- Pharyngeal- Thin Teaspoon -- Pharyngeal -- Pharyngeal- Thin Cup Penetration/Aspiration during swallow;Reduced airway/laryngeal closure;Penetration/Aspiration before swallow;Pharyngeal residue - pyriform Pharyngeal Material enters airway, remains ABOVE vocal cords and not ejected out Pharyngeal- Thin Straw Penetration/Aspiration during swallow;Reduced airway/laryngeal closure Pharyngeal Material enters airway, remains ABOVE vocal cords and not ejected out Pharyngeal- Puree WFL Pharyngeal -- Pharyngeal- Mechanical Soft -- Pharyngeal -- Pharyngeal- Regular WFL Pharyngeal -- Pharyngeal- Multi-consistency -- Pharyngeal -- Pharyngeal- Pill -- Pharyngeal -- Pharyngeal Comment --  CHL IP CERVICAL ESOPHAGEAL PHASE 11/19/2017 Cervical Esophageal Phase (No Data) Pudding Teaspoon -- Pudding Cup -- Honey  Teaspoon -- Honey Cup -- Nectar Teaspoon -- Nectar Cup -- Nectar Straw -- Thin Teaspoon -- Thin Cup -- Thin Straw -- Puree -- Mechanical Soft -- Regular -- Multi-consistency -- Pill -- Cervical Esophageal Comment -- No flowsheet data found. Houston Siren 11/19/2017, 1:24 PM               Anti-infectives: Anti-infectives (From admission, onward)   Start     Dose/Rate Route Frequency Ordered Stop   11/18/17 1800  vancomycin (VANCOCIN) IVPB 1000 mg/200 mL premix     1,000 mg 200 mL/hr over 60 Minutes Intravenous Every 48 hours 11/17/17 1419 11/22/17 2359   11/15/17 0515  vancomycin (VANCOCIN) 1,250 mg in sodium chloride 0.9 % 250 mL IVPB  Status:  Discontinued     1,250 mg 166.7 mL/hr over 90 Minutes Intravenous Every 24 hours 11/15/17 0502 11/15/17 1746   11/14/17 2315  vancomycin (VANCOCIN) IVPB 750 mg/150 ml premix  Status:  Discontinued     750 mg 150 mL/hr over 60 Minutes Intravenous Every 12 hours 11/14/17 2306 11/14/17 2309   11/13/17 1600  vancomycin (VANCOCIN) IVPB 750 mg/150 ml premix  Status:  Discontinued     750 mg 150 mL/hr over 60 Minutes Intravenous Every 12 hours 11/13/17 1057 11/14/17 2306   11/11/17 1600  vancomycin (VANCOCIN) IVPB 1000 mg/200 mL premix  Status:  Discontinued     1,000 mg 200 mL/hr over 60 Minutes Intravenous Every 12 hours 11/11/17 0443 11/13/17 1057   11/09/17 1600  vancomycin (VANCOCIN) IVPB 750 mg/150 ml premix  Status:  Discontinued     750 mg 150 mL/hr over 60 Minutes Intravenous Every 12  hours 11/09/17 1500 11/11/17 0443   11/09/17 0800  vancomycin (VANCOCIN) IVPB 750 mg/150 ml premix  Status:  Discontinued     750 mg 150 mL/hr over 60 Minutes Intravenous Every 12 hours 11/08/17 2152 11/09/17 1500   11/09/17 0200  piperacillin-tazobactam (ZOSYN) IVPB 3.375 g     3.375 g 12.5 mL/hr over 240 Minutes Intravenous Every 8 hours 11/08/17 1821 11/22/17 2359   11/08/17 2200  vancomycin (VANCOCIN) 2,000 mg in sodium chloride 0.9 % 500 mL IVPB      2,000 mg 250 mL/hr over 120 Minutes Intravenous  Once 11/08/17 2152 11/09/17 0020   11/08/17 1830  piperacillin-tazobactam (ZOSYN) IVPB 3.375 g     3.375 g 100 mL/hr over 30 Minutes Intravenous  Once 11/08/17 1821 11/08/17 2134   11/08/17 1745  piperacillin-tazobactam (ZOSYN) IVPB 3.375 g  Status:  Discontinued     3.375 g 100 mL/hr over 30 Minutes Intravenous  Once 11/08/17 1733 11/08/17 1734      Assessment/Plan: s/p Procedure(s): EXPLORATORY LAPAROTOMY WITH HARTMANN PROCEDURE (N/A) Advance diet as indicated by speech Continue wound care  ABX for total of 10 days from surgery date   LOS: 12 days    Marcello Moores A Carzell Saldivar 11/20/2017

## 2017-11-20 NOTE — Progress Notes (Signed)
Family Medicine Teaching Service Daily Progress Note Intern Pager: [phone]  Patient name: Roberto Owen Medical record number: [id number] Date of birth: 21-Apr-1935 Age: 82 y.o. Gender: male  Primary Care Provider: Patient, No Pcp Per Consultants: Surgery, IR, Cardiology, Oncology Code Status: FULL  Pt Overview and Major Events to Date:  11/19 - admitted for GI spasms, LOA 11/22 - allowed to eat liquid diet per Surgery 11/23 - Left chest tube placement by IR 11/25- perforated diverticuli requiring emergent colectomy  Assessment and Plan: Roberto Owen is a 82 y.o. male presenting with an in place abdominal drain due to previous episode of diverticulitis with abscess of the sigmoid colon, developed bowel perforation s/p ex-lap and coloectomy with colostomy placed and found to have colorectal carcinoma. PMH is significant for Peptic ulcer disease, history of GI bleed and previous episode of diverticulosis.  Perforated viscus s/p ex lap and colostomy (Hartmann's Procedure):  He is POD#6.  - Surgery following, appreciate recommendations - Per Surgery, Vanc 1g for 3 doses every 48hrs started on 11/29. Zosyn 3.375g q8 for 14 days, started on 11/20. - tylenol PRN - zofran PRN - D5NS at 75/hr, dysphagia diet  Colorectal Carcinoma: Patient was found to have colorectal carcinoma after ex lap and colostomy. Hematology and GI oncology on board. Pathology showed T4N1 perforated adenocarcinoma. - oncology following, appreciate recommendations- plan to discuss further with patient and daughter this afternoon - Hematology also on board  Loculated Left Pleural Effusion: Chest tube in place since 11/23.  - Vanc/Zosyn for HCAP coverage overlaps with coverage from ex-lap and colectomy with colostomy. - Continuous pulse ox - repeat CXR today, if appearance stable may d/c chest tube 12/2 per IR  New Onset Afib now with bradycardia: stable on oral amiodarone. - Cardiology has signed off and will  follow up outpatient.  - Stopped Heparin due to small subdural hematomas found; neuro recs appreciated - continue amio 200mg  daily - continue Metoprolol 12.5 BID  Anemia: Hemoglobin stable at 9.3, BL ~ 9.0 - Continue to monitor with CBC - per onc recs, will give IV feraheme during hospitalization and d/c w/ oral iron  Hypoalbuminemia due to Severe protein-calorie malnutrition ([place]): - Per Nutrition 74ml Prostat TID.  - dysphagia diet  AKI: improving. Cr 2.04 today. Baseline ~0.8. Renal U/S showed bilateral non-obstructive nephrolithasis. Calculi measure on the order of 8 mm each, one seen in the lower the right kidney and the second in the upper pole of left kidney. - Continue IVFs at half maintenance now that patient is on diet - Continue to follow BMP for resolution of AKI  Hypokalemia: stable, 4.0 today. - monitor and replete as necessary  Small Subdural Hematoma: stable. CT Head on right convexity 3.9 mm subdural hematoma without change on 11/26. MRI also on 11/27  showed no acute changes, stable 3-66mm right cerebral convexity subdural hematoma. - Holding heparin now. Consult Neuro, appreciate recs  FEN/GI: Protonix, dysphagia 2 diet Prophylaxis: Heparin  Disposition: continue inpatient management of GI spasms  Subjective:  Reports he is doing well. He is very motivated to continue working with PT. Complains of cough, feeling like something is stuck that he has to get up. Working with IS. No fevers, chills, pain.   Objective: Temp:  [98.5 F (36.9 C)-98.6 F (37 C)] 98.5 F (36.9 C) (12/01 0628) Pulse Rate:  [68-86] 86 (12/01 0628) Resp:  [18] 18 (12/01 0628) BP: (147-159)/(71-81) 147/81 (12/01 0628) SpO2:  [99 %-100 %] 100 % (12/01 0628) Weight:  [220 lb  14.4 oz (100.2 kg)] 220 lb 14.4 oz (100.2 kg) (12/01 7371)  Physical Exam: Gen: pleasant elderly man sitting up in bed in NAD CV: Irregular rhythm, normal rate, no murmurs, normal S1, S2 split, +2 pulses dorsalis  pedis bilaterally Resp: CTAB, no wheezing, rales, or rhonchi, comfortable work of breathing Abd: non-distended, non-tender, soft, +bs in all four quadrants,colostomy in place in LUQ, midline incision dressed, not warm, not erythematous, no discharge with drain in place. Ext: no clubbing, cyanosis in LE, trace edema in hands bilaterally Neuro: CN II-XII intact, no focal or gross deficits Skin: warm, dry, intact, no rashes  Laboratory: Recent Labs  Lab 11/18/17 0504 11/19/17 0506 11/20/17 0423  WBC 14.1* 10.1 8.8  HGB 8.8* 8.7* 9.3*  HCT 27.4* 27.2* 29.2*  PLT 336 328 298   Recent Labs  Lab 11/14/17 1311  11/18/17 0504 11/19/17 0800 11/20/17 0423  NA 136   < > 144 145 144  K 2.9*   < > 3.3* 3.9 4.0  CL 107   < > 121* 122* 122*  CO2 22   < > 19* 18* 17*  BUN 18   < > 22* 24* 22*  CREATININE 2.26*   < > 2.32* 2.27* 2.04*  CALCIUM 7.1*   < > 7.2* 7.4* 7.7*  PROT 5.7*  --   --   --  5.9*  BILITOT 0.7  --   --   --  0.4  ALKPHOS 61  --   --   --  51  ALT 11*  --   --   --  12*  AST 21  --   --   --  17  GLUCOSE 86   < > 124* 94 79   < > = values in this interval not displayed.   11/19 - Lipase: 19 11/19 - Lactic Acid: 1.52, 1.15 11/20 - Troponin I: 0.03,  11/20 - TSH: 0.271 11/20 - T3: 0.9; T4: 0.98 11/23 - Pleural Cx: NG for 5 days; gram stain normal 11/25 - BCx: NGTD >48hrs 11/28 - Mag: 1.8, Phos: 2.8  Imaging/Diagnostic Tests:  Dg Chest 2 View  Result Date: 11/08/2017 IMPRESSION: Mild left basilar subsegmental atelectasis or infiltrate is noted. Followup PA and lateral chest X-ray is recommended in 3-4 weeks following trial of antibiotic therapy to ensure resolution and exclude underlying malignancy. Electronically Signed   By: Marijo Conception, M.D.   On: 11/08/2017 16:46   Ct Chest W Contrast  Result Date: 11/10/2017 IMPRESSION: 1. Loculated bilateral pleural effusions, left greater than right. 2. Associated airspace disease likely reflects atelectasis. 3.  Additional pleural irregularity on the left may represent infection associated with the fusion. 4. 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 Dextroconvex curvature is associated. Electronically Signed   By: San Morelle M.D.   On: 11/10/2017 15:19   Ct Abdomen Pelvis W Contrast  Result Date: 11/08/2017 IMPRESSION: 1. The position of the left abdominal percutaneous pigtail catheter appears stable since October. No residual abscess but continued and perhaps progressive circumferential colonic wall thickening and mesenteric inflammation near the catheter. 2. Increased low-density stool distending the upstream left colon and splenic flexure raising the possibility of partial obstruction. 3. Consider Colonoscopy: The persistent colonic wall thickening in #1 may be inflammatory but colonic tumor is difficult to exclude. 4. New since October small to moderate size loculated left pleural effusion. Associated lung base atelectasis without definite pneumonia. Electronically Signed   By: Herminio Heads.D.  On: 11/08/2017 15:30   CT Perc Pleural Drain Placement: 11/12/2017 CT-guided placement of a chest tube within the loculated left pleural effusion.  CT Head: 11/15/2017 IMPRESSION: Right convexity 3.9 mm subdural hematoma without change. No significant associated mass effect. No new intracranial hemorrhage. No CT evidence of large acute infarct or diffuse anoxia. Atrophy.  Renal U/S:  11/15/2017 IMPRESSION: 1. Bilateral nephrolithiasis without obstructive uropathy. Calculi measure on the order of 8 mm each, one seen in the lower the right kidney and the second in the upper pole of left kidney. 2. No loss of cortical-medullary distinction of either kidney. 3. Foley decompressed urinary bladder. 4. Small amount of perihepatic free fluid.  MRI Brain: 11/16/2017 IMPRESSION: 1. No new acute intracranial abnormality. 2. Stable 3-4 mm right cerebral convexity subdural  hematoma. 3. Mild chronic microvascular ischemic changes and mild parenchymal volume loss of the brain. 4. 4 mm right lateral ventricle nodule on under belly of corpus callosum, likely benign. Comparison with prior MRI imaging where available is recommended. Otherwise if clinically indicated consider 6-12 month follow-up to evaluate for stability.  CXR: 11/28 IMPRESSION: Stable support devices.  No pneumothorax. Left base atelectasis, improving since prior study.  Steve Rattler, DO 11/20/2017, 10:15 AM PGY-2, [place] Intern pager: [phone], text pages welcome

## 2017-11-20 NOTE — Progress Notes (Signed)
Patient ID: Roberto Owen, male   DOB: 1935/06/22, 82 y.o.   MRN: [id number]    Referring Physician(s): Dr. Fanny Skates  Supervising Physician: Aletta Edouard  Patient Status: [facility] - In-pt  Chief Complaint: Left pleural effusion  Subjective: Patient feels ok today.  He denies any SOB.    Allergies: Patient has no known allergies.  Medications: Prior to Admission medications   Medication Sig Start Date End Date Taking? Authorizing Provider  acetaminophen (TYLENOL) 500 MG tablet Take 1,000 mg by mouth every 6 (six) hours as needed for mild pain.   Yes [provider]  ferrous sulfate 325 (65 FE) MG tablet Take 1 tablet (325 mg total) by mouth daily with breakfast. 07/27/17  Yes Tawny Asal, MD  Multiple Vitamins-Minerals (ADULT ONE DAILY GUMMIES) CHEW Chew 1 tablet by mouth daily.    Yes [provider]  pantoprazole (PROTONIX) 40 MG tablet TAKE 1 TABLET BY MOUTH TWICE DAILY 09/10/17  Yes Lorella Nimrod, MD  Simethicone 180 MG CAPS Take 1 capsule every 4 (four) hours as needed by mouth (for gas, bloating).   Yes [provider]  Sod Bicarb-Ginger-Fennel-Cham (GRIPE WATER) LIQD Take 30 mLs every 4 (four) hours as needed by mouth (for gas).    Yes [provider]  traMADol (ULTRAM) 50 MG tablet Take 1 tablet (50 mg total) by mouth every 6 (six) hours as needed for moderate pain or severe pain. Patient not taking: Reported on 10/23/2017 09/21/17   Aline August, MD    Vital Signs: BP (!) 137/56 (BP Location: Left Arm)   Pulse 66   Temp 98.5 F (36.9 C) (Oral)   Resp 18   Ht 6\' 6"  (1.981 m)   Wt 220 lb 14.4 oz (100.2 kg)   SpO2 97%   BMI 25.53 kg/m   Physical Exam: Chest: chest drain in place with minimal serous output.  40cc documented in last 24hrs.    Imaging: Mr Brain Wo Contrast  Result Date: 11/16/2017 CLINICAL DATA:  82 y/o M; asymmetry limb movement abnormality of uncertain etiology. History of subdural hematoma. EXAM: MRI HEAD  WITHOUT CONTRAST TECHNIQUE: Multiplanar, multiecho pulse sequences of the brain and surrounding structures were obtained without intravenous contrast. COMPARISON:  11/15/2017 CT head. FINDINGS: Brain: No reduced diffusion to suggest acute or early subacute infarction. Intermediate T1 and high T2 signal acute 3-4 mm subdural hematoma over right cerebral convexity is stable. No significant mass effect. No new intracranial hemorrhage. Mild chronic microvascular ischemic changes of white matter and parenchymal volume loss of the brain. 4 mm right lateral ventricle nodule along the under belly of corpus callosum with increased T2 and intermediate T1 signal (series 11, image 17 and series 10, image 17). Vascular: Normal flow voids. Skull and upper cervical spine: Normal marrow signal. Sinuses/Orbits: Negative. Other: None. IMPRESSION: 1. No new acute intracranial abnormality. 2. Stable 3-4 mm right cerebral convexity subdural hematoma. 3. Mild chronic microvascular ischemic changes and mild parenchymal volume loss of the brain. 4. 4 mm right lateral ventricle nodule on under belly of corpus callosum, likely benign. Comparison with prior MRI imaging where available is recommended. Otherwise if clinically indicated consider 6-12 month follow-up to evaluate for stability. Electronically Signed   By: Kristine Garbe M.D.   On: 11/16/2017 14:53   Dg Chest Port 1 View  Result Date: 11/17/2017 CLINICAL DATA:  Intubated EXAM: PORTABLE CHEST 1 VIEW COMPARISON:  11/14/2017 FINDINGS: Support devices are stable. Left basilar chest tube remains in place. No pneumothorax. Heart  is borderline in size. Left base atelectasis, improving since prior study. IMPRESSION: Stable support devices.  No pneumothorax. Left base atelectasis, improving since prior study. Electronically Signed   By: Rolm Baptise M.D.   On: 11/17/2017 09:01   Dg Swallowing Func-speech Pathology  Result Date: 11/19/2017 Objective Swallowing Evaluation:  Type of Study: Bedside Swallow Evaluation  Patient Details Name: Roberto Owen MRN: [id number] Date of Birth: 12-27-1934 Today's Date: 11/19/2017 Time: SLP Start Time (ACUTE ONLY): 1158 -SLP Stop Time (ACUTE ONLY): 8185 SLP Time Calculation (min) (ACUTE ONLY): 16 min Past Medical History: Past Medical History: Diagnosis Date . Anemia  . Bladder diverticulum 11/10/2017 . Esophageal ulcer   hx/notes 09/15/2017 . Gastric ulcer   hx/notes 09/15/2017 . GI bleed  . History of blood transfusion 07/2017 . NSAID induced gastritis  Past Surgical History: Past Surgical History: Procedure Laterality Date . COLONOSCOPY  ~ 05/2016 . ESOPHAGOGASTRODUODENOSCOPY (EGD) WITH PROPOFOL N/A 07/25/2017  Procedure: ESOPHAGOGASTRODUODENOSCOPY (EGD) WITH PROPOFOL;  Surgeon: Mauri Pole, MD;  Location: [facility];  Service: Endoscopy;  Laterality: N/A; . IR CATHETER TUBE CHANGE  10/28/2017 . IR RADIOLOGIST EVAL & MGMT  09/30/2017 . IR RADIOLOGIST EVAL & MGMT  10/19/2017 . LAPAROTOMY N/A 11/14/2017  Procedure: EXPLORATORY LAPAROTOMY WITH HARTMANN PROCEDURE;  Surgeon: Clovis Riley, MD;  Location: [facility] OR;  Service: General;  Laterality: N/A; HPI: Pt is an 82 year old male with a known peptic ulcer disease, history of GI bleed, diverticulosis who presents with draining around indwelling catheter secondary to diverticulitis with abscess who was brought to the ED with increasing weight loss, GI spasms, cough plus leaking around his diverticular catheter site. Pt now s/p ex lap with partial sigmoid colectomy on 11/25. Of note, pt was intubated from 11/25-11/28.  Subjective: pt alert but not a great historian, provides inconsistent descriptions Assessment / Plan / Recommendation CHL IP CLINICAL IMPRESSIONS 11/19/2017 Clinical Impression -- SLP Visit Diagnosis -- Attention and concentration deficit following -- Frontal lobe and executive function deficit following -- Impact on safety and function (No Data)   CHL IP TREATMENT RECOMMENDATION  11/19/2017 Treatment Recommendations Therapy as outlined in treatment plan below   Prognosis 11/19/2017 Prognosis for Safe Diet Advancement Good Barriers to Reach Goals Cognitive deficits Barriers/Prognosis Comment -- CHL IP DIET RECOMMENDATION 11/19/2017 SLP Diet Recommendations Dysphagia 2 (Fine chop) solids;Thin liquid Liquid Administration via Cup;No straw Medication Administration Whole meds with puree Compensations Small sips/bites;Minimize environmental distractions;Slow rate;Clear throat intermittently Postural Changes Seated upright at 90 degrees   CHL IP OTHER RECOMMENDATIONS 11/19/2017 Recommended Consults -- Oral Care Recommendations Oral care BID Other Recommendations --   CHL IP FOLLOW UP RECOMMENDATIONS 11/19/2017 Follow up Recommendations (No Data)   CHL IP FREQUENCY AND DURATION 11/19/2017 Speech Therapy Frequency (ACUTE ONLY) min 2x/week Treatment Duration 2 weeks      CHL IP ORAL PHASE 11/19/2017 Oral Phase Impaired Oral - Pudding Teaspoon -- Oral - Pudding Cup -- Oral - Honey Teaspoon -- Oral - Honey Cup -- Oral - Nectar Teaspoon -- Oral - Nectar Cup -- Oral - Nectar Straw -- Oral - Thin Teaspoon -- Oral - Thin Cup Delayed oral transit;Premature spillage Oral - Thin Straw Delayed oral transit Oral - Puree Delayed oral transit Oral - Mech Soft -- Oral - Regular Delayed oral transit Oral - Multi-Consistency -- Oral - Pill -- Oral Phase - Comment --  CHL IP PHARYNGEAL PHASE 11/19/2017 Pharyngeal Phase Impaired Pharyngeal- Pudding Teaspoon -- Pharyngeal -- Pharyngeal- Pudding Cup -- Pharyngeal -- Pharyngeal- Honey Teaspoon -- Pharyngeal --  Pharyngeal- Honey Cup -- Pharyngeal -- Pharyngeal- Nectar Teaspoon -- Pharyngeal -- Pharyngeal- Nectar Cup -- Pharyngeal -- Pharyngeal- Nectar Straw -- Pharyngeal -- Pharyngeal- Thin Teaspoon -- Pharyngeal -- Pharyngeal- Thin Cup Penetration/Aspiration during swallow;Reduced airway/laryngeal closure;Penetration/Aspiration before swallow;Pharyngeal residue -  pyriform Pharyngeal Material enters airway, remains ABOVE vocal cords and not ejected out Pharyngeal- Thin Straw Penetration/Aspiration during swallow;Reduced airway/laryngeal closure Pharyngeal Material enters airway, remains ABOVE vocal cords and not ejected out Pharyngeal- Puree WFL Pharyngeal -- Pharyngeal- Mechanical Soft -- Pharyngeal -- Pharyngeal- Regular WFL Pharyngeal -- Pharyngeal- Multi-consistency -- Pharyngeal -- Pharyngeal- Pill -- Pharyngeal -- Pharyngeal Comment --  CHL IP CERVICAL ESOPHAGEAL PHASE 11/19/2017 Cervical Esophageal Phase (No Data) Pudding Teaspoon -- Pudding Cup -- Honey Teaspoon -- Honey Cup -- Nectar Teaspoon -- Nectar Cup -- Nectar Straw -- Thin Teaspoon -- Thin Cup -- Thin Straw -- Puree -- Mechanical Soft -- Regular -- Multi-consistency -- Pill -- Cervical Esophageal Comment -- No flowsheet data found. Houston Siren 11/19/2017, 1:24 PM               Labs:  CBC: Recent Labs    11/17/17 0447 11/18/17 0504 11/19/17 0506 11/20/17 0423  WBC 21.2* 14.1* 10.1 8.8  HGB 8.6* 8.8* 8.7* 9.3*  HCT 25.6* 27.4* 27.2* 29.2*  PLT 294 336 328 298    COAGS: Recent Labs    07/23/17 1328 09/16/17 0736 11/08/17 2125  INR 1.09 1.18 1.11  APTT 27  --  33    BMP: Recent Labs    11/17/17 0447 11/18/17 0504 11/19/17 0800 11/20/17 0423  NA 141 144 145 144  K 3.5 3.3* 3.9 4.0  CL 117* 121* 122* 122*  CO2 19* 19* 18* 17*  GLUCOSE 124* 124* 94 79  BUN 23* 22* 24* 22*  CALCIUM 7.1* 7.2* 7.4* 7.7*  CREATININE 2.45* 2.32* 2.27* 2.04*  GFRNONAA 23* 25* 26* 29*  GFRAA 27* 29* 30* 34*    LIVER FUNCTION TESTS: Recent Labs    11/08/17 1041 11/09/17 0304 11/14/17 1311 11/20/17 0423  BILITOT 0.5 0.4 0.7 0.4  AST 27 22 21 17   ALT 14* 12* 11* 12*  ALKPHOS 92 73 61 51  PROT 7.2 6.2* 5.7* 5.9*  ALBUMIN 1.7* 1.5* 1.2* 1.2*    Assessment and Plan: 1. S/p left chest drain placement  Plan to waterseal chest drain today.  If follow up chest x-ray shows no  evidence of PTX or reaccumulation of effusion, then plan for possible removal within the next day or so.  Electronically Signed: Henreitta Cea 11/20/2017, 11:31 AM   I spent a total of 15 Minutes at the the patient's bedside AND on the patient's hospital floor or unit, greater than 50% of which was counseling/coordinating care for left pleural effusion

## 2017-11-20 NOTE — Plan of Care (Signed)
Patient stable during 7 a to 7 p shift, no complaints other than abdominal pain for which he received Tramadol with some improvement.  Wound vac changed this shift.  Colostomy with approximately 200 ccs output this shift.  Patient eating approximately 50% of meals, does not like Ensure but will drink juice.  CBG's difficult to manage this shift, several readings below 70, patient asymptomatic.  Discussed with primary MD who added dextrose to IV fluids to see if this would help sustain patient.  Will continue to monitor.

## 2017-11-21 LAB — GLUCOSE, CAPILLARY
GLUCOSE-CAPILLARY: 36 mg/dL — AB (ref 65–99)
GLUCOSE-CAPILLARY: 63 mg/dL — AB (ref 65–99)
GLUCOSE-CAPILLARY: 71 mg/dL (ref 65–99)
GLUCOSE-CAPILLARY: 86 mg/dL (ref 65–99)
Glucose-Capillary: 64 mg/dL — ABNORMAL LOW (ref 65–99)
Glucose-Capillary: 86 mg/dL (ref 65–99)
Glucose-Capillary: 94 mg/dL (ref 65–99)
Glucose-Capillary: 95 mg/dL (ref 65–99)

## 2017-11-21 LAB — BASIC METABOLIC PANEL
Anion gap: 3 — ABNORMAL LOW (ref 5–15)
BUN: 21 mg/dL — ABNORMAL HIGH (ref 6–20)
CHLORIDE: 118 mmol/L — AB (ref 101–111)
CO2: 19 mmol/L — ABNORMAL LOW (ref 22–32)
CREATININE: 2.18 mg/dL — AB (ref 0.61–1.24)
Calcium: 7.6 mg/dL — ABNORMAL LOW (ref 8.9–10.3)
GFR, EST AFRICAN AMERICAN: 31 mL/min — AB (ref >60)
GFR, EST NON AFRICAN AMERICAN: 27 mL/min — AB (ref >60)
Glucose, Bld: 99 mg/dL (ref 65–99)
POTASSIUM: 3.8 mmol/L (ref 3.5–5.1)
SODIUM: 140 mmol/L (ref 135–145)

## 2017-11-21 NOTE — Progress Notes (Signed)
0440 CBG 36. Pt stated he did not feel tired or sluggish asymptomatic. Pt has 5% dextrose running at 60ml/hr.  Pt eating softened graham crackers and peanut butter orange juice and sips of ensure, tolerated well.  0535 CBG 63.  Will continue to monitor.

## 2017-11-21 NOTE — Plan of Care (Signed)
  Progressing Safety: Ability to remain free from injury will improve 11/21/2017 0405 - Progressing by Ardine Eng, RN

## 2017-11-21 NOTE — NC FL2 (Signed)
[place] LEVEL OF CARE SCREENING TOOL     IDENTIFICATION  Patient Name: Roberto Owen Birthdate: [birth year] Sex: male Admission Date (Current Location): 11/08/2017  [facility] and [place] Number:  Herbalist and Address:  [facility], [place] [address] [place], [place] [address]      Provider Number: [id number]  Attending Physician Name and Address:  Lind Covert, MD  Relative Name and Phone Number:  Zellous,[name] Daughter   [phone] [place],Dr. Christia Reading   [phone]     Current Level of Care: Hospital Recommended Level of Care: [place] Prior Approval Number:    Date Approved/Denied:   PASRR Number: [id number]  Discharge Plan: SNF    Current Diagnoses: Patient Active Problem List   Diagnosis Date Noted  . Severe protein-calorie malnutrition ([place]) 11/11/2017  . Dehydration   . Hypoalbuminemia due to protein-calorie malnutrition ([place]) 11/10/2017  . Bladder diverticulum 11/10/2017  . Colonic obstruction ([place]) 11/10/2017  . Thrombocytosis ([place]) 11/10/2017  . Euthyroid sick syndrome 11/10/2017  . Hematuria, microscopic 11/10/2017  . Hiccups 11/10/2017  . Atrial fibrillation ([place])   . Loculated pleural effusion on Left, Possible   . Diverticulitis 11/08/2017  . Colonic fistula, History of, by fistulogram 09/30/2017  . Hyponatremia 09/16/2017  . Diverticulitis of large intestine with abscess 09/16/2017  . PUD (peptic ulcer disease) 09/16/2017  . AKI (acute kidney injury) ([place]) 09/16/2017  . Diverticulitis of intestine with abscess 09/16/2017  . Orthostasis 09/16/2017  . Diverticular disease of intestine with perforation and abscess   . Acute gastric ulcer with hemorrhage   . Multiple gastric ulcers   . Anemia due to GI blood loss   . GI bleed 07/23/2017  . Microcytic anemia 07/23/2017    Orientation RESPIRATION BLADDER Height & Weight     Self  Normal Continent Weight: 224 lb  13.9 oz (102 kg) Height:  _0  (198.1 cm)  BEHAVIORAL SYMPTOMS/MOOD NEUROLOGICAL BOWEL NUTRITION STATUS      Continent Diet(Dysphagia 2 diet)  AMBULATORY STATUS COMMUNICATION OF NEEDS Skin   Limited Assist Verbally Surgical wounds, Wound Vac                       Personal Care Assistance Level of Assistance  Bathing, Feeding, Dressing Bathing Assistance: Limited assistance Feeding assistance: Limited assistance Dressing Assistance: Limited assistance     Functional Limitations Info  Sight, Hearing, Speech Sight Info: Adequate Hearing Info: Adequate Speech Info: Adequate    SPECIAL CARE FACTORS FREQUENCY  PT (By licensed PT)     PT Frequency: 5x a week              Contractures Contractures Info: Not present    Additional Factors Info  Code Status, Allergies Code Status Info: Full Code Allergies Info: NKA           Current Medications (11/21/2017):  This is the current hospital active medication list Current Facility-Administered Medications  Medication Dose Route Frequency Provider Last Rate Last Dose  . 0.9 %  sodium chloride infusion   Intravenous Once Suzy Bouchard, CRNA   Stopped at 11/18/17 0500  . acetaminophen (TYLENOL) solution 650 mg  650 mg Oral Q6H Rogue Bussing, MD   650 mg at 11/21/17 [phone]  . amiodarone (PACERONE) tablet 200 mg  200 mg Oral Daily Rogue Bussing, MD   200 mg at 11/21/17 1103  . chlorhexidine gluconate (MEDLINE KIT) (PERIDEX) 0.12 % solution 15 mL  15 mL Mouth Rinse BID Rigoberto Noel, MD   15 mL at 11/20/17 2200  . feeding supplement (ENSURE ENLIVE) (ENSURE ENLIVE) liquid 237 mL  237 mL Oral BID BM Rogue Bussing, MD   237 mL at 11/19/17 1550  . heparin injection 5,000 Units  5,000 Units Subcutaneous Q8H Lockamy, Timothy, DO   5,000 Units at 11/21/17 1453  . MEDLINE mouth rinse  15 mL Mouth Rinse QID Rigoberto Noel, MD   15 mL at 11/21/17 [phone]  . metoprolol tartrate (LOPRESSOR) tablet 12.5 mg   12.5 mg Oral BID Rogue Bussing, MD   12.5 mg at 11/21/17 1103  . ondansetron (ZOFRAN) injection 4 mg  4 mg Intravenous Q6H PRN Smiley Houseman, MD      . pantoprazole (PROTONIX) EC tablet 40 mg  40 mg Oral BID Rogue Bussing, MD   40 mg at 11/21/17 1104  . piperacillin-tazobactam (ZOSYN) IVPB 3.375 g  3.375 g Intravenous Q8H Mannam, Praveen, MD 12.5 mL/hr at 11/21/17 1452 3.375 g at 11/21/17 1452  . ramelteon (ROZEREM) tablet 8 mg  8 mg Oral QHS Lockamy, Timothy, DO   8 mg at 11/20/17 2216  . traMADol (ULTRAM) tablet 50 mg  50 mg Oral Q8H PRN Riccio, Angela C, DO   50 mg at 11/20/17 1626  . vancomycin (VANCOCIN) IVPB 1000 mg/200 mL premix  1,000 mg Intravenous Q48H Mannam, Hart Robinsons, MD   Stopped at 11/20/17 1931  . white petrolatum (VASELINE) gel   Topical PRN Erick Colace, NP         Discharge Medications: Please see discharge summary for a list of discharge medications.  Relevant Imaging Results:  Relevant Lab Results:   Additional Information SSN   Price Lach[place]elle, Jones Broom, LCSWA

## 2017-11-21 NOTE — Progress Notes (Signed)
7 Days Post-Op   Subjective/Chief Complaint: No complaints, sore with vac change last night, tol diet   Objective: Vital signs in last 24 hours: Temp:  [98 F (36.7 C)-98.5 F (36.9 C)] 98 F (36.7 C) (12/02 0445) Pulse Rate:  [56-68] 56 (12/02 0445) Resp:  [18] 18 (12/02 0445) BP: (130-137)/(56-71) 130/56 (12/02 0445) SpO2:  [97 %-100 %] 100 % (12/02 0445) Weight:  [102 kg (224 lb 13.9 oz)] 102 kg (224 lb 13.9 oz) (12/02 0445) Last BM Date: 11/20/17  Intake/Output from previous day: 12/01 0701 - 12/02 0700 In: 1825 [P.O.:900; I.V.:725; IV Piggyback:200] Out: 1607 [Urine:1050; Stool:525] Intake/Output this shift: No intake/output data recorded.  GI: vac in place stoma pink and functional soft bs present  Lab Results:  Recent Labs    11/19/17 0506 11/20/17 0423  WBC 10.1 8.8  HGB 8.7* 9.3*  HCT 27.2* 29.2*  PLT 328 298   BMET Recent Labs    11/19/17 0800 11/20/17 0423 11/20/17 0954  NA 145 144  --   K 3.9 4.0  --   CL 122* 122*  --   CO2 18* 17*  --   GLUCOSE 94 79 111*  BUN 24* 22*  --   CREATININE 2.27* 2.04*  --   CALCIUM 7.4* 7.7*  --    PT/INR No results for input(s): LABPROT, INR in the last 72 hours. ABG No results for input(s): PHART, HCO3 in the last 72 hours.  Invalid input(s): PCO2, PO2  Studies/Results: Dg Chest Port 1 View  Result Date: 11/20/2017 CLINICAL DATA:  Evaluation after placement of left basilar chest tube to water seal. Original placement of pigtail chest tube on 11/12/2017 to treat loculated basilar pleural effusion. EXAM: PORTABLE CHEST 1 VIEW COMPARISON:  11/17/2017 FINDINGS: Interval extubation and removal of nasogastric tube. Left basilar chest tube shows stable course and positioning. No significant residual pleural effusion. Mild bibasilar atelectasis. No pneumothorax. Stable heart size. IMPRESSION: No pneumothorax. No significant residual left-sided pleural effusion. Electronically Signed   By: Aletta Edouard M.D.   On:  11/20/2017 15:02   Dg Swallowing Func-speech Pathology  Result Date: 11/19/2017 Objective Swallowing Evaluation: Type of Study: Bedside Swallow Evaluation  Patient Details Name: Roberto Owen MRN: [id number] Date of Birth: Feb 13, 1935 Today's Date: 11/19/2017 Time: SLP Start Time (ACUTE ONLY): 1158 -SLP Stop Time (ACUTE ONLY): 8546 SLP Time Calculation (min) (ACUTE ONLY): 16 min Past Medical History: Past Medical History: Diagnosis Date . Anemia  . Bladder diverticulum 11/10/2017 . Esophageal ulcer   hx/notes 09/15/2017 . Gastric ulcer   hx/notes 09/15/2017 . GI bleed  . History of blood transfusion 07/2017 . NSAID induced gastritis  Past Surgical History: Past Surgical History: Procedure Laterality Date . COLONOSCOPY  ~ 05/2016 . ESOPHAGOGASTRODUODENOSCOPY (EGD) WITH PROPOFOL N/A 07/25/2017  Procedure: ESOPHAGOGASTRODUODENOSCOPY (EGD) WITH PROPOFOL;  Surgeon: Mauri Pole, MD;  Location: [facility];  Service: Endoscopy;  Laterality: N/A; . IR CATHETER TUBE CHANGE  10/28/2017 . IR RADIOLOGIST EVAL & MGMT  09/30/2017 . IR RADIOLOGIST EVAL & MGMT  10/19/2017 . LAPAROTOMY N/A 11/14/2017  Procedure: EXPLORATORY LAPAROTOMY WITH HARTMANN PROCEDURE;  Surgeon: Clovis Riley, MD;  Location: [facility];  Service: General;  Laterality: N/A; HPI: Pt is an 82 year old male with a known peptic ulcer disease, history of GI bleed, diverticulosis who presents with draining around indwelling catheter secondary to diverticulitis with abscess who was brought to the ED with increasing weight loss, GI spasms, cough plus leaking around his diverticular catheter site. Pt now s/p  ex lap with partial sigmoid colectomy on 11/25. Of note, pt was intubated from 11/25-11/28.  Subjective: pt alert but not a great historian, provides inconsistent descriptions Assessment / Plan / Recommendation CHL IP CLINICAL IMPRESSIONS 11/19/2017 Clinical Impression -- SLP Visit Diagnosis -- Attention and concentration deficit following -- Frontal lobe and  executive function deficit following -- Impact on safety and function (No Data)   CHL IP TREATMENT RECOMMENDATION 11/19/2017 Treatment Recommendations Therapy as outlined in treatment plan below   Prognosis 11/19/2017 Prognosis for Safe Diet Advancement Good Barriers to Reach Goals Cognitive deficits Barriers/Prognosis Comment -- CHL IP DIET RECOMMENDATION 11/19/2017 SLP Diet Recommendations Dysphagia 2 (Fine chop) solids;Thin liquid Liquid Administration via Cup;No straw Medication Administration Whole meds with puree Compensations Small sips/bites;Minimize environmental distractions;Slow rate;Clear throat intermittently Postural Changes Seated upright at 90 degrees   CHL IP OTHER RECOMMENDATIONS 11/19/2017 Recommended Consults -- Oral Care Recommendations Oral care BID Other Recommendations --   CHL IP FOLLOW UP RECOMMENDATIONS 11/19/2017 Follow up Recommendations (No Data)   CHL IP FREQUENCY AND DURATION 11/19/2017 Speech Therapy Frequency (ACUTE ONLY) min 2x/week Treatment Duration 2 weeks      CHL IP ORAL PHASE 11/19/2017 Oral Phase Impaired Oral - Pudding Teaspoon -- Oral - Pudding Cup -- Oral - Honey Teaspoon -- Oral - Honey Cup -- Oral - Nectar Teaspoon -- Oral - Nectar Cup -- Oral - Nectar Straw -- Oral - Thin Teaspoon -- Oral - Thin Cup Delayed oral transit;Premature spillage Oral - Thin Straw Delayed oral transit Oral - Puree Delayed oral transit Oral - Mech Soft -- Oral - Regular Delayed oral transit Oral - Multi-Consistency -- Oral - Pill -- Oral Phase - Comment --  CHL IP PHARYNGEAL PHASE 11/19/2017 Pharyngeal Phase Impaired Pharyngeal- Pudding Teaspoon -- Pharyngeal -- Pharyngeal- Pudding Cup -- Pharyngeal -- Pharyngeal- Honey Teaspoon -- Pharyngeal -- Pharyngeal- Honey Cup -- Pharyngeal -- Pharyngeal- Nectar Teaspoon -- Pharyngeal -- Pharyngeal- Nectar Cup -- Pharyngeal -- Pharyngeal- Nectar Straw -- Pharyngeal -- Pharyngeal- Thin Teaspoon -- Pharyngeal -- Pharyngeal- Thin Cup Penetration/Aspiration  during swallow;Reduced airway/laryngeal closure;Penetration/Aspiration before swallow;Pharyngeal residue - pyriform Pharyngeal Material enters airway, remains ABOVE vocal cords and not ejected out Pharyngeal- Thin Straw Penetration/Aspiration during swallow;Reduced airway/laryngeal closure Pharyngeal Material enters airway, remains ABOVE vocal cords and not ejected out Pharyngeal- Puree WFL Pharyngeal -- Pharyngeal- Mechanical Soft -- Pharyngeal -- Pharyngeal- Regular WFL Pharyngeal -- Pharyngeal- Multi-consistency -- Pharyngeal -- Pharyngeal- Pill -- Pharyngeal -- Pharyngeal Comment --  CHL IP CERVICAL ESOPHAGEAL PHASE 11/19/2017 Cervical Esophageal Phase (No Data) Pudding Teaspoon -- Pudding Cup -- Honey Teaspoon -- Honey Cup -- Nectar Teaspoon -- Nectar Cup -- Nectar Straw -- Thin Teaspoon -- Thin Cup -- Thin Straw -- Puree -- Mechanical Soft -- Regular -- Multi-consistency -- Pill -- Cervical Esophageal Comment -- No flowsheet data found. Houston Siren 11/19/2017, 1:24 PM               Anti-infectives: Anti-infectives (From admission, onward)   Start     Dose/Rate Route Frequency Ordered Stop   11/18/17 1800  vancomycin (VANCOCIN) IVPB 1000 mg/200 mL premix     1,000 mg 200 mL/hr over 60 Minutes Intravenous Every 48 hours 11/17/17 1419 11/22/17 2359   11/15/17 0515  vancomycin (VANCOCIN) 1,250 mg in sodium chloride 0.9 % 250 mL IVPB  Status:  Discontinued     1,250 mg 166.7 mL/hr over 90 Minutes Intravenous Every 24 hours 11/15/17 0502 11/15/17 1746   11/14/17 2315  vancomycin (VANCOCIN) IVPB 750  mg/150 ml premix  Status:  Discontinued     750 mg 150 mL/hr over 60 Minutes Intravenous Every 12 hours 11/14/17 2306 11/14/17 2309   11/13/17 1600  vancomycin (VANCOCIN) IVPB 750 mg/150 ml premix  Status:  Discontinued     750 mg 150 mL/hr over 60 Minutes Intravenous Every 12 hours 11/13/17 1057 11/14/17 2306   11/11/17 1600  vancomycin (VANCOCIN) IVPB 1000 mg/200 mL premix  Status:   Discontinued     1,000 mg 200 mL/hr over 60 Minutes Intravenous Every 12 hours 11/11/17 0443 11/13/17 1057   11/09/17 1600  vancomycin (VANCOCIN) IVPB 750 mg/150 ml premix  Status:  Discontinued     750 mg 150 mL/hr over 60 Minutes Intravenous Every 12 hours 11/09/17 1500 11/11/17 0443   11/09/17 0800  vancomycin (VANCOCIN) IVPB 750 mg/150 ml premix  Status:  Discontinued     750 mg 150 mL/hr over 60 Minutes Intravenous Every 12 hours 11/08/17 2152 11/09/17 1500   11/09/17 0200  piperacillin-tazobactam (ZOSYN) IVPB 3.375 g     3.375 g 12.5 mL/hr over 240 Minutes Intravenous Every 8 hours 11/08/17 1821 11/22/17 2359   11/08/17 2200  vancomycin (VANCOCIN) 2,000 mg in sodium chloride 0.9 % 500 mL IVPB     2,000 mg 250 mL/hr over 120 Minutes Intravenous  Once 11/08/17 2152 11/09/17 0020   11/08/17 1830  piperacillin-tazobactam (ZOSYN) IVPB 3.375 g     3.375 g 100 mL/hr over 30 Minutes Intravenous  Once 11/08/17 1821 11/08/17 2134   11/08/17 1745  piperacillin-tazobactam (ZOSYN) IVPB 3.375 g  Status:  Discontinued     3.375 g 100 mL/hr over 30 Minutes Intravenous  Once 11/08/17 1733 11/08/17 1734      Assessment/Plan: POD #7 hartmanns- Dr Kae Heller -continue d2 diet per speech with supplements -vac changes T/TH/Sat -Dr Kae Heller discussed path, patient will have oncology follow up per Dr Starleen Arms note -can use pharm dvt proph when ok from sdh standpoint -can dc from surgical standpoint anytime - will f/u Dr Gerald Dexter 11/21/2017

## 2017-11-21 NOTE — Plan of Care (Signed)
Patient stable during 7 a to 7 p shift, VSS.  Tramadol for abdominal pain x 1.  Continues to eat minimally, will drink some Ensure and juice.  Chest tube pulled today by radiology pa, no complaints of shortness of breath or other.  O2 saturation 100% on room air.

## 2017-11-21 NOTE — Progress Notes (Signed)
Family Medicine Teaching Service Daily Progress Note Intern Pager: [phone]  Patient name: Roberto Owen Medical record number: [id number] Date of birth: 04/02/1935 Age: 82 y.o. Gender: male  Primary Care Provider: Patient, No Pcp Per Consultants: Surgery, IR, Cardiology, Oncology Code Status: FULL  Pt Overview and Major Events to Date:  11/19 - admitted for GI spasms, LOA 11/22 - allowed to eat liquid diet per Surgery 11/23 - Left chest tube placement by IR 11/25- perforated diverticuli requiring emergent colectomy  Assessment and Plan: Roberto Owen is a 82 y.o. male presenting with an in place abdominal drain due to previous episode of diverticulitis with abscess of the sigmoid colon, developed bowel perforation s/p ex-lap and coloectomy with colostomy placed and found to have colorectal carcinoma. PMH is significant for Peptic ulcer disease, history of GI bleed and previous episode of diverticulosis.  Perforated viscus s/p ex lap and colostomy (Hartmann's Procedure):  He is POD#7.  - Surgery following, appreciate recommendations - Per Surgery, Vanc 1g for 3 doses every 48hrs started on 11/29. Zosyn 3.375g q8 for 14 days, started on 11/20 (day 12). - tylenol PRN - zofran PRN - D5NS at 75/hr, dysphagia diet  Colorectal Carcinoma: Patient was found to have colorectal carcinoma after ex lap and colostomy. Hematology and GI oncology on board. Pathology showed T4N1 perforated adenocarcinoma. - oncology following, appreciate recommendations- plan to discuss further with patient and daughter - Hematology also on board  Loculated Left Pleural Effusion: Chest tube in place since 11/23. CXR 12/1 stable with no significant remaining pleural effusion - Vanc/Zosyn for HCAP coverage overlaps with coverage from ex-lap and colectomy with colostomy. - Continuous pulse ox - d/c chest tube per IR likely today or tomorrow  Hypoglycemia- patient has had persistently low glucose readings despite  eating and drinking, D50 given. He is asymptomatic even when CBG reads 36. No sugar lowering medications on board. Unclear etiology.  -added D5 to fluids -assess glucose on BMP  New Onset Afib now with bradycardia: stable on oral amiodarone. - Cardiology has signed off and will follow up outpatient.  - Stopped Heparin due to small subdural hematomas found; neuro recs appreciated - continue amio 200mg  daily - continue Metoprolol 12.5 BID  Anemia: Hemoglobin stable at 9.3, BL ~ 9.0 - Continue to monitor with CBC - per onc recs, will give IV feraheme during hospitalization and d/c w/ oral iron  Hypoalbuminemia due to Severe protein-calorie malnutrition ([place]): - Per Nutrition 82ml Prostat TID.  - dysphagia diet  AKI: improving. BMP pending today. Baseline ~0.8. Renal U/S showed bilateral non-obstructive nephrolithasis. Calculi measure on the order of 8 mm each, one seen in the lower the right kidney and the second in the upper pole of left kidney. - follow up BMP from today - Continue IVFs at half maintenance now that patient is on diet - Continue to follow BMP for resolution of AKI  Hypokalemia: stable - monitor and replete as necessary  Small Subdural Hematoma: stable. CT Head on right convexity 3.9 mm subdural hematoma without change on 11/26. MRI also on 11/27  showed no acute changes, stable 3-76mm right cerebral convexity subdural hematoma. D/w neurosurgery who felt hematomas were stable and it was okay to restart anticoagulation -Continue heparin ppx for now  FEN/GI: Protonix, dysphagia 2 diet Prophylaxis: Heparin  Disposition: continue inpatient management  Subjective:  Feels well today. Has some abdominal pain with coughing along incision. No other complaints. No fevers or chills. Denies feeling shaky, diaphoretic, tired, confused.   Objective: Temp:  [  98 F (36.7 C)-98.5 F (36.9 C)] 98 F (36.7 C) (12/02 0445) Pulse Rate:  [56-68] 56 (12/02 0445) Resp:  [18] 18  (12/02 0445) BP: (130-137)/(56-71) 130/56 (12/02 0445) SpO2:  [97 %-100 %] 100 % (12/02 0445) Weight:  [224 lb 13.9 oz (102 kg)] 224 lb 13.9 oz (102 kg) (12/02 0445)  Physical Exam: Gen: pleasant elderly man sitting up in bed in NAD CV: Irregular rhythm, normal rate, no murmurs, normal S1, S2 split, +2 pulses dorsalis pedis bilaterally Resp: CTAB, no wheezing, rales, or rhonchi, comfortable work of breathing Abd: non-distended, non-tender, soft, +bs in all four quadrants,colostomy in place in LUQ, midline incision dressed, not warm, not erythematous, no discharge with drain in place. Ext: no clubbing, cyanosis in LE, trace edema in hands bilaterally Neuro: CN II-XII intact, no focal or gross deficits Skin: warm, dry, intact, no rashes  Laboratory: Recent Labs  Lab 11/18/17 0504 11/19/17 0506 11/20/17 0423  WBC 14.1* 10.1 8.8  HGB 8.8* 8.7* 9.3*  HCT 27.4* 27.2* 29.2*  PLT 336 328 298   Recent Labs  Lab 11/14/17 1311  11/18/17 0504 11/19/17 0800 11/20/17 0423 11/20/17 0954  NA 136   < > 144 145 144  --   K 2.9*   < > 3.3* 3.9 4.0  --   CL 107   < > 121* 122* 122*  --   CO2 22   < > 19* 18* 17*  --   BUN 18   < > 22* 24* 22*  --   CREATININE 2.26*   < > 2.32* 2.27* 2.04*  --   CALCIUM 7.1*   < > 7.2* 7.4* 7.7*  --   PROT 5.7*  --   --   --  5.9*  --   BILITOT 0.7  --   --   --  0.4  --   ALKPHOS 61  --   --   --  51  --   ALT 11*  --   --   --  12*  --   AST 21  --   --   --  17  --   GLUCOSE 86   < > 124* 94 79 111*   < > = values in this interval not displayed.   11/19 - Lipase: 19 11/19 - Lactic Acid: 1.52, 1.15 11/20 - Troponin I: 0.03,  11/20 - TSH: 0.271 11/20 - T3: 0.9; T4: 0.98 11/23 - Pleural Cx: NG for 5 days; gram stain normal 11/25 - BCx: NGTD >48hrs 11/28 - Mag: 1.8, Phos: 2.8  Imaging/Diagnostic Tests:  Dg Chest 2 View  Result Date: 11/08/2017 IMPRESSION: Mild left basilar subsegmental atelectasis or infiltrate is noted. Followup PA and lateral  chest X-ray is recommended in 3-4 weeks following trial of antibiotic therapy to ensure resolution and exclude underlying malignancy. Electronically Signed   By: Marijo Conception, M.D.   On: 11/08/2017 16:46   Ct Chest W Contrast  Result Date: 11/10/2017 IMPRESSION: 1. Loculated bilateral pleural effusions, left greater than right. 2. Associated airspace disease likely reflects atelectasis. 3. Additional pleural irregularity on the left may represent infection associated with the fusion. 4. 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 Dextroconvex curvature is associated. Electronically Signed   By: San Morelle M.D.   On: 11/10/2017 15:19   Ct Abdomen Pelvis W Contrast  Result Date: 11/08/2017 IMPRESSION: 1. The position of the left abdominal percutaneous pigtail catheter appears stable since October. No residual  abscess but continued and perhaps progressive circumferential colonic wall thickening and mesenteric inflammation near the catheter. 2. Increased low-density stool distending the upstream left colon and splenic flexure raising the possibility of partial obstruction. 3. Consider Colonoscopy: The persistent colonic wall thickening in #1 may be inflammatory but colonic tumor is difficult to exclude. 4. New since October small to moderate size loculated left pleural effusion. Associated lung base atelectasis without definite pneumonia. Electronically Signed   By: Genevie Ann M.D.   On: 11/08/2017 15:30   CT Perc Pleural Drain Placement: 11/12/2017 CT-guided placement of a chest tube within the loculated left pleural effusion.  CT Head: 11/15/2017 IMPRESSION: Right convexity 3.9 mm subdural hematoma without change. No significant associated mass effect. No new intracranial hemorrhage. No CT evidence of large acute infarct or diffuse anoxia. Atrophy.  Renal U/S:  11/15/2017 IMPRESSION: 1. Bilateral nephrolithiasis without obstructive uropathy. Calculi measure on  the order of 8 mm each, one seen in the lower the right kidney and the second in the upper pole of left kidney. 2. No loss of cortical-medullary distinction of either kidney. 3. Foley decompressed urinary bladder. 4. Small amount of perihepatic free fluid.  MRI Brain: 11/16/2017 IMPRESSION: 1. No new acute intracranial abnormality. 2. Stable 3-4 mm right cerebral convexity subdural hematoma. 3. Mild chronic microvascular ischemic changes and mild parenchymal volume loss of the brain. 4. 4 mm right lateral ventricle nodule on under belly of corpus callosum, likely benign. Comparison with prior MRI imaging where available is recommended. Otherwise if clinically indicated consider 6-12 month follow-up to evaluate for stability.  CXR: 11/28 IMPRESSION: Stable support devices.  No pneumothorax. Left base atelectasis, improving since prior study.  Dg Chest Port 1 View  Result Date: 11/20/2017 CLINICAL DATA:  Evaluation after placement of left basilar chest tube to water seal. Original placement of pigtail chest tube on 11/12/2017 to treat loculated basilar pleural effusion. EXAM: PORTABLE CHEST 1 VIEW COMPARISON:  11/17/2017 FINDINGS: Interval extubation and removal of nasogastric tube. Left basilar chest tube shows stable course and positioning. No significant residual pleural effusion. Mild bibasilar atelectasis. No pneumothorax. Stable heart size. IMPRESSION: No pneumothorax. No significant residual left-sided pleural effusion. Electronically Signed   By: Aletta Edouard M.D.   On: 11/20/2017 15:02     Steve Rattler, DO 11/21/2017, 8:27 AM PGY-2, [place] Intern pager: [phone], text pages welcome

## 2017-11-21 NOTE — Progress Notes (Signed)
Patient ID: Roberto Owen, male   DOB: 03-18-1935, 82 y.o.   MRN: [id number]    Referring Physician(s): Dr. Fanny Skates  Supervising Physician: Aletta Edouard  Patient Status: [facility] - In-pt  Chief Complaint: Left pleural effusion  Subjective: Patient feels well.  No complaints  Allergies: Patient has no known allergies.  Medications: Prior to Admission medications   Medication Sig Start Date End Date Taking? Authorizing Provider  acetaminophen (TYLENOL) 500 MG tablet Take 1,000 mg by mouth every 6 (six) hours as needed for mild pain.   Yes [provider]  ferrous sulfate 325 (65 FE) MG tablet Take 1 tablet (325 mg total) by mouth daily with breakfast. 07/27/17  Yes Tawny Asal, MD  Multiple Vitamins-Minerals (ADULT ONE DAILY GUMMIES) CHEW Chew 1 tablet by mouth daily.    Yes [provider]  pantoprazole (PROTONIX) 40 MG tablet TAKE 1 TABLET BY MOUTH TWICE DAILY 09/10/17  Yes Lorella Nimrod, MD  Simethicone 180 MG CAPS Take 1 capsule every 4 (four) hours as needed by mouth (for gas, bloating).   Yes [provider]  Sod Bicarb-Ginger-Fennel-Cham (GRIPE WATER) LIQD Take 30 mLs every 4 (four) hours as needed by mouth (for gas).    Yes [provider]  traMADol (ULTRAM) 50 MG tablet Take 1 tablet (50 mg total) by mouth every 6 (six) hours as needed for moderate pain or severe pain. Patient not taking: Reported on 10/23/2017 09/21/17   Aline August, MD    Vital Signs: BP (!) 119/51 (BP Location: Left Arm)   Pulse 68   Temp 97.8 F (36.6 C) (Oral)   Resp 20   Ht 6\' 6"  (1.981 m)   Wt 224 lb 13.9 oz (102 kg)   SpO2 97%   BMI 25.99 kg/m   Physical Exam: Chest: left chest tube removed with no issues.  vaseline gauze dressing placed along with gauze and tape.  Imaging: Dg Chest Port 1 View  Result Date: 11/20/2017 CLINICAL DATA:  Evaluation after placement of left basilar chest tube to water seal. Original placement of pigtail chest tube  on 11/12/2017 to treat loculated basilar pleural effusion. EXAM: PORTABLE CHEST 1 VIEW COMPARISON:  11/17/2017 FINDINGS: Interval extubation and removal of nasogastric tube. Left basilar chest tube shows stable course and positioning. No significant residual pleural effusion. Mild bibasilar atelectasis. No pneumothorax. Stable heart size. IMPRESSION: No pneumothorax. No significant residual left-sided pleural effusion. Electronically Signed   By: Aletta Edouard M.D.   On: 11/20/2017 15:02   Dg Swallowing Func-speech Pathology  Result Date: 11/19/2017 Objective Swallowing Evaluation: Type of Study: Bedside Swallow Evaluation  Patient Details Name: Roberto Owen MRN: [id number] Date of Birth: Jun 26, 1935 Today's Date: 11/19/2017 Time: SLP Start Time (ACUTE ONLY): 1158 -SLP Stop Time (ACUTE ONLY): 2542 SLP Time Calculation (min) (ACUTE ONLY): 16 min Past Medical History: Past Medical History: Diagnosis Date . Anemia  . Bladder diverticulum 11/10/2017 . Esophageal ulcer   hx/notes 09/15/2017 . Gastric ulcer   hx/notes 09/15/2017 . GI bleed  . History of blood transfusion 07/2017 . NSAID induced gastritis  Past Surgical History: Past Surgical History: Procedure Laterality Date . COLONOSCOPY  ~ 05/2016 . ESOPHAGOGASTRODUODENOSCOPY (EGD) WITH PROPOFOL N/A 07/25/2017  Procedure: ESOPHAGOGASTRODUODENOSCOPY (EGD) WITH PROPOFOL;  Surgeon: Mauri Pole, MD;  Location: [facility];  Service: Endoscopy;  Laterality: N/A; . IR CATHETER TUBE CHANGE  10/28/2017 . IR RADIOLOGIST EVAL & MGMT  09/30/2017 . IR RADIOLOGIST EVAL & MGMT  10/19/2017 . LAPAROTOMY N/A 11/14/2017  Procedure: EXPLORATORY  LAPAROTOMY WITH HARTMANN PROCEDURE;  Surgeon: Clovis Riley, MD;  Location: [facility] OR;  Service: General;  Laterality: N/A; HPI: Pt is an 82 year old male with a known peptic ulcer disease, history of GI bleed, diverticulosis who presents with draining around indwelling catheter secondary to diverticulitis with abscess who was brought to  the ED with increasing weight loss, GI spasms, cough plus leaking around his diverticular catheter site. Pt now s/p ex lap with partial sigmoid colectomy on 11/25. Of note, pt was intubated from 11/25-11/28.  Subjective: pt alert but not a great historian, provides inconsistent descriptions Assessment / Plan / Recommendation CHL IP CLINICAL IMPRESSIONS 11/19/2017 Clinical Impression -- SLP Visit Diagnosis -- Attention and concentration deficit following -- Frontal lobe and executive function deficit following -- Impact on safety and function (No Data)   CHL IP TREATMENT RECOMMENDATION 11/19/2017 Treatment Recommendations Therapy as outlined in treatment plan below   Prognosis 11/19/2017 Prognosis for Safe Diet Advancement Good Barriers to Reach Goals Cognitive deficits Barriers/Prognosis Comment -- CHL IP DIET RECOMMENDATION 11/19/2017 SLP Diet Recommendations Dysphagia 2 (Fine chop) solids;Thin liquid Liquid Administration via Cup;No straw Medication Administration Whole meds with puree Compensations Small sips/bites;Minimize environmental distractions;Slow rate;Clear throat intermittently Postural Changes Seated upright at 90 degrees   CHL IP OTHER RECOMMENDATIONS 11/19/2017 Recommended Consults -- Oral Care Recommendations Oral care BID Other Recommendations --   CHL IP FOLLOW UP RECOMMENDATIONS 11/19/2017 Follow up Recommendations (No Data)   CHL IP FREQUENCY AND DURATION 11/19/2017 Speech Therapy Frequency (ACUTE ONLY) min 2x/week Treatment Duration 2 weeks      CHL IP ORAL PHASE 11/19/2017 Oral Phase Impaired Oral - Pudding Teaspoon -- Oral - Pudding Cup -- Oral - Honey Teaspoon -- Oral - Honey Cup -- Oral - Nectar Teaspoon -- Oral - Nectar Cup -- Oral - Nectar Straw -- Oral - Thin Teaspoon -- Oral - Thin Cup Delayed oral transit;Premature spillage Oral - Thin Straw Delayed oral transit Oral - Puree Delayed oral transit Oral - Mech Soft -- Oral - Regular Delayed oral transit Oral - Multi-Consistency -- Oral -  Pill -- Oral Phase - Comment --  CHL IP PHARYNGEAL PHASE 11/19/2017 Pharyngeal Phase Impaired Pharyngeal- Pudding Teaspoon -- Pharyngeal -- Pharyngeal- Pudding Cup -- Pharyngeal -- Pharyngeal- Honey Teaspoon -- Pharyngeal -- Pharyngeal- Honey Cup -- Pharyngeal -- Pharyngeal- Nectar Teaspoon -- Pharyngeal -- Pharyngeal- Nectar Cup -- Pharyngeal -- Pharyngeal- Nectar Straw -- Pharyngeal -- Pharyngeal- Thin Teaspoon -- Pharyngeal -- Pharyngeal- Thin Cup Penetration/Aspiration during swallow;Reduced airway/laryngeal closure;Penetration/Aspiration before swallow;Pharyngeal residue - pyriform Pharyngeal Material enters airway, remains ABOVE vocal cords and not ejected out Pharyngeal- Thin Straw Penetration/Aspiration during swallow;Reduced airway/laryngeal closure Pharyngeal Material enters airway, remains ABOVE vocal cords and not ejected out Pharyngeal- Puree WFL Pharyngeal -- Pharyngeal- Mechanical Soft -- Pharyngeal -- Pharyngeal- Regular WFL Pharyngeal -- Pharyngeal- Multi-consistency -- Pharyngeal -- Pharyngeal- Pill -- Pharyngeal -- Pharyngeal Comment --  CHL IP CERVICAL ESOPHAGEAL PHASE 11/19/2017 Cervical Esophageal Phase (No Data) Pudding Teaspoon -- Pudding Cup -- Honey Teaspoon -- Honey Cup -- Nectar Teaspoon -- Nectar Cup -- Nectar Straw -- Thin Teaspoon -- Thin Cup -- Thin Straw -- Puree -- Mechanical Soft -- Regular -- Multi-consistency -- Pill -- Cervical Esophageal Comment -- No flowsheet data found. Houston Siren 11/19/2017, 1:24 PM               Labs:  CBC: Recent Labs    11/17/17 0447 11/18/17 0504 11/19/17 0506 11/20/17 0423  WBC 21.2* 14.1* 10.1 8.8  HGB 8.6*  8.8* 8.7* 9.3*  HCT 25.6* 27.4* 27.2* 29.2*  PLT 294 336 328 298    COAGS: Recent Labs    07/23/17 1328 09/16/17 0736 11/08/17 2125  INR 1.09 1.18 1.11  APTT 27  --  33    BMP: Recent Labs    11/18/17 0504 11/19/17 0800 11/20/17 0423 11/20/17 0954 11/21/17 0853  NA 144 145 144  --  140  K 3.3* 3.9 4.0   --  3.8  CL 121* 122* 122*  --  118*  CO2 19* 18* 17*  --  19*  GLUCOSE 124* 94 79 111* 99  BUN 22* 24* 22*  --  21*  CALCIUM 7.2* 7.4* 7.7*  --  7.6*  CREATININE 2.32* 2.27* 2.04*  --  2.18*  GFRNONAA 25* 26* 29*  --  27*  GFRAA 29* 30* 34*  --  31*    LIVER FUNCTION TESTS: Recent Labs    11/08/17 1041 11/09/17 0304 11/14/17 1311 11/20/17 0423  BILITOT 0.5 0.4 0.7 0.4  AST 27 22 21 17   ALT 14* 12* 11* 12*  ALKPHOS 92 73 61 51  PROT 7.2 6.2* 5.7* 5.9*  ALBUMIN 1.7* 1.5* 1.2* 1.2*    Assessment and Plan: 1. Left loculated pleural effusion, s/p drain placement Drain has been on waterseal.  Follow up xray shows no PTX or accumulation of fluid.  The chest drain was removed with no issues.  Further care per primary service.  Electronically Signed: Henreitta Cea 11/21/2017, 12:16 PM   I spent a total of 15 Minutes at the the patient's bedside AND on the patient's hospital floor or unit, greater than 50% of which was counseling/coordinating care for left pleural effusion

## 2017-11-22 ENCOUNTER — Telehealth: Payer: Self-pay | Admitting: Oncology

## 2017-11-22 LAB — CBC
HCT: 26.6 % — ABNORMAL LOW (ref 39.0–52.0)
HEMOGLOBIN: 8.5 g/dL — AB (ref 13.0–17.0)
MCH: 24.8 pg — AB (ref 26.0–34.0)
MCHC: 32 g/dL (ref 30.0–36.0)
MCV: 77.6 fL — ABNORMAL LOW (ref 78.0–100.0)
PLATELETS: 355 10*3/uL (ref 150–400)
RBC: 3.43 MIL/uL — ABNORMAL LOW (ref 4.22–5.81)
RDW: 19.8 % — AB (ref 11.5–15.5)
WBC: 9.6 10*3/uL (ref 4.0–10.5)

## 2017-11-22 LAB — GLUCOSE, CAPILLARY
GLUCOSE-CAPILLARY: 60 mg/dL — AB (ref 65–99)
GLUCOSE-CAPILLARY: 75 mg/dL (ref 65–99)
GLUCOSE-CAPILLARY: 83 mg/dL (ref 65–99)
GLUCOSE-CAPILLARY: 76 mg/dL (ref 65–99)
Glucose-Capillary: 94 mg/dL (ref 65–99)
Glucose-Capillary: 77 mg/dL (ref 65–99)
Glucose-Capillary: 63 mg/dL — ABNORMAL LOW (ref 65–99)
Glucose-Capillary: 84 mg/dL (ref 65–99)

## 2017-11-22 LAB — BASIC METABOLIC PANEL
Anion gap: 4 — ABNORMAL LOW (ref 5–15)
BUN: 20 mg/dL (ref 6–20)
CALCIUM: 7.7 mg/dL — AB (ref 8.9–10.3)
CHLORIDE: 117 mmol/L — AB (ref 101–111)
CO2: 19 mmol/L — ABNORMAL LOW (ref 22–32)
CREATININE: 2.34 mg/dL — AB (ref 0.61–1.24)
GFR, EST AFRICAN AMERICAN: 29 mL/min — AB (ref >60)
GFR, EST NON AFRICAN AMERICAN: 25 mL/min — AB (ref >60)
Glucose, Bld: 93 mg/dL (ref 65–99)
Potassium: 3.9 mmol/L (ref 3.5–5.1)
SODIUM: 140 mmol/L (ref 135–145)

## 2017-11-22 MED ORDER — DEXTROSE-NACL 5-0.9 % IV SOLN: INTRAVENOUS | Status: DC

## 2017-11-22 MED ORDER — ADULT MULTIVITAMIN W/MINERALS CH
1.0000 | ORAL_TABLET | Freq: Every day | ORAL | Status: DC
  Administered 2017-11-22 – 2017-11-26 (×5): 1 via ORAL
  Filled 2017-11-22 (×6): qty 1

## 2017-11-22 MED ORDER — ENSURE ENLIVE PO LIQD
237.0000 mL | Freq: Three times a day (TID) | ORAL | Status: DC
  Administered 2017-11-22 – 2017-11-26 (×9): 237 mL via ORAL

## 2017-11-22 MED ORDER — APIXABAN 2.5 MG PO TABS
2.5000 mg | ORAL_TABLET | Freq: Two times a day (BID) | ORAL | Status: DC
  Administered 2017-11-22 – 2017-11-26 (×9): 2.5 mg via ORAL
  Filled 2017-11-22 (×9): qty 1

## 2017-11-22 MED ORDER — SODIUM CHLORIDE 0.9 % IV SOLN
510.0000 mg | Freq: Once | INTRAVENOUS | Status: AC
  Administered 2017-11-22: 510 mg via INTRAVENOUS
  Filled 2017-11-22: qty 17

## 2017-11-22 NOTE — Consult Note (Addendum)
[place] Nurse wound consult note Reason for Consult: Pt is being followed by the surgical team for assessment and plan of care. Assisted bedside nurse with Vac dressing change.  Wound type: Full thickness abd wound to midline abd Wound bed: beefy red Drainage (amount, consistency, odor) small amt pink drainage, no odor Periwound: Intact skin surrounding Dressing procedure/placement/frequency: Applied one piece of black foam to 16mm cont suction.  Pt tolerated with minimal discomfort.  Plan for bedside nurses to change Q M/W/F  WOC Nurse ostomy consult note Stoma type/location:  Colostomy stoma to LLQ Stomal assessment/size: Stoma is red and viable with tan colored edges, 1 1/2 inches, slightly above skin level. Peristomal assessment: intact skin surrounding Output: Mod amt tan liquid stool in the pouch Ostomy pouching: 1pc.  Education provided:   Applied 1piece pouch with barrier ring to attempt to maintain a seal. Supplies at the bedside for staff nurse use.  Pt plans to transfer to a rehab facility, according to the EMR notes. No family at the bedside during the teaching session.  Pt did not participate in pouch application or opening and closing velcro and will need total assistance with pouching activities. Enrolled patient in [place] program: No Julien Girt MSN, [place], [place], [place], [place]

## 2017-11-22 NOTE — Clinical Social Work Note (Signed)
CSW provided patient with bed offers so far. He will review with his daughter when she arrives today.  Dayton Scrape, [place]

## 2017-11-22 NOTE — Progress Notes (Signed)
Wound vac dressing changed. Will continue to monitor.

## 2017-11-22 NOTE — Progress Notes (Signed)
8 Days Post-Op   Subjective/Chief Complaint: TOLERATING DIET OSTOMY WORKING    Objective: Vital signs in last 24 hours: Temp:  [97.8 F (36.6 C)-98.8 F (37.1 C)] 98.8 F (37.1 C) (12/03 0600) Pulse Rate:  [61-73] 61 (12/03 0600) Resp:  [16-20] 16 (12/03 0600) BP: (114-125)/(48-59) 125/48 (12/03 0600) SpO2:  [97 %-100 %] 100 % (12/03 0600) Weight:  [103 kg (227 lb 1.2 oz)] 103 kg (227 lb 1.2 oz) (12/03 0600) Last BM Date: 11/21/17(colostomy )  Intake/Output from previous day: 12/02 0701 - 12/03 0700 In: 1110 [P.O.:960; IV Piggyback:150] Out: 1375 [Urine:875; Drains:125; Stool:375] Intake/Output this shift: No intake/output data recorded.  Incision/Wound:VAC IN PLACE  OSTOMY WITH MILD RETRACTION BUT VIABLE AND WORKING   Lab Results:  Recent Labs    11/20/17 0423 11/22/17 0556  WBC 8.8 9.6  HGB 9.3* 8.5*  HCT 29.2* 26.6*  PLT 298 355   BMET Recent Labs    11/21/17 0853 11/22/17 0556  NA 140 140  K 3.8 3.9  CL 118* 117*  CO2 19* 19*  GLUCOSE 99 93  BUN 21* 20  CREATININE 2.18* 2.34*  CALCIUM 7.6* 7.7*   PT/INR No results for input(s): LABPROT, INR in the last 72 hours. ABG No results for input(s): PHART, HCO3 in the last 72 hours.  Invalid input(s): PCO2, PO2  Studies/Results: Dg Chest Port 1 View  Result Date: 11/20/2017 CLINICAL DATA:  Evaluation after placement of left basilar chest tube to water seal. Original placement of pigtail chest tube on 11/12/2017 to treat loculated basilar pleural effusion. EXAM: PORTABLE CHEST 1 VIEW COMPARISON:  11/17/2017 FINDINGS: Interval extubation and removal of nasogastric tube. Left basilar chest tube shows stable course and positioning. No significant residual pleural effusion. Mild bibasilar atelectasis. No pneumothorax. Stable heart size. IMPRESSION: No pneumothorax. No significant residual left-sided pleural effusion. Electronically Signed   By: Aletta Edouard M.D.   On: 11/20/2017 15:02     Anti-infectives: Anti-infectives (From admission, onward)   Start     Dose/Rate Route Frequency Ordered Stop   11/18/17 1800  vancomycin (VANCOCIN) IVPB 1000 mg/200 mL premix     1,000 mg 200 mL/hr over 60 Minutes Intravenous Every 48 hours 11/17/17 1419 11/22/17 2359   11/15/17 0515  vancomycin (VANCOCIN) 1,250 mg in sodium chloride 0.9 % 250 mL IVPB  Status:  Discontinued     1,250 mg 166.7 mL/hr over 90 Minutes Intravenous Every 24 hours 11/15/17 0502 11/15/17 1746   11/14/17 2315  vancomycin (VANCOCIN) IVPB 750 mg/150 ml premix  Status:  Discontinued     750 mg 150 mL/hr over 60 Minutes Intravenous Every 12 hours 11/14/17 2306 11/14/17 2309   11/13/17 1600  vancomycin (VANCOCIN) IVPB 750 mg/150 ml premix  Status:  Discontinued     750 mg 150 mL/hr over 60 Minutes Intravenous Every 12 hours 11/13/17 1057 11/14/17 2306   11/11/17 1600  vancomycin (VANCOCIN) IVPB 1000 mg/200 mL premix  Status:  Discontinued     1,000 mg 200 mL/hr over 60 Minutes Intravenous Every 12 hours 11/11/17 0443 11/13/17 1057   11/09/17 1600  vancomycin (VANCOCIN) IVPB 750 mg/150 ml premix  Status:  Discontinued     750 mg 150 mL/hr over 60 Minutes Intravenous Every 12 hours 11/09/17 1500 11/11/17 0443   11/09/17 0800  vancomycin (VANCOCIN) IVPB 750 mg/150 ml premix  Status:  Discontinued     750 mg 150 mL/hr over 60 Minutes Intravenous Every 12 hours 11/08/17 2152 11/09/17 1500   11/09/17 0200  piperacillin-tazobactam (ZOSYN) IVPB 3.375 g     3.375 g 12.5 mL/hr over 240 Minutes Intravenous Every 8 hours 11/08/17 1821 11/22/17 2359   11/08/17 2200  vancomycin (VANCOCIN) 2,000 mg in sodium chloride 0.9 % 500 mL IVPB     2,000 mg 250 mL/hr over 120 Minutes Intravenous  Once 11/08/17 2152 11/09/17 0020   11/08/17 1830  piperacillin-tazobactam (ZOSYN) IVPB 3.375 g     3.375 g 100 mL/hr over 30 Minutes Intravenous  Once 11/08/17 1821 11/08/17 2134   11/08/17 1745  piperacillin-tazobactam (ZOSYN) IVPB 3.375 g   Status:  Discontinued     3.375 g 100 mL/hr over 30 Minutes Intravenous  Once 11/08/17 1733 11/08/17 1734      Assessment/Plan: s/p Procedure(s): EXPLORATORY LAPAROTOMY WITH HARTMANN PROCEDURE (N/A) VAC CHANGE AS DIRECTED  SURGICALLY STABLE AT THIS POINT    LOS: 14 days    Kaycee Haycraft A Floyed Masoud 11/22/2017

## 2017-11-22 NOTE — Progress Notes (Signed)
FPTS Interim Progress Note  Discussed patient with Dr. Benay Spice, heme-onc, to discuss anticoagulation therapy given history of cancer. Per Dr. Benay Spice patient may be on Eliquis once cleared by surgery for anticoagulation.  Caroline More, DO 11/22/2017, 12:05 PM PGY-1, [place] [facility] Service pager [phone]

## 2017-11-22 NOTE — Progress Notes (Signed)
Nutrition Follow-up  DOCUMENTATION CODES:   Severe malnutrition in context of chronic illness  INTERVENTION:   -MVI daily -Increase Ensure Enlive po to TID, each supplement provides 350 kcal and 20 grams of protein  NUTRITION DIAGNOSIS:   Severe Malnutrition related to chronic illness(altered GI function) as evidenced by severe muscle depletion, severe fat depletion, percent weight loss(8% in 3 months).  Ongoing  GOAL:   Patient will meet greater than or equal to 90% of their needs  Progressing  MONITOR:   PO intake, Supplement acceptance, Diet advancement, Labs, Weight trends, Skin, I & O's  REASON FOR ASSESSMENT:   Consult Assessment of nutrition requirement/status  ASSESSMENT:   Pt with PMH of anemia, peptic ulcer disease, hx of GI bleed, diverticulosis presents with colonic obstruction and loculated pleural effusion possibly causing persistent hiccups  11/25- S/P sigmoid colectomy and colostomy for perforated viscus 11/28- extubated 11/29- transferred from ICU to floor; per surgery note, pathology positive for T4N1 perforated adenocarcinoma 11/30- s/p MBSS- advanced to dysphagia 2 diet with thin liquids, cortrak tube removed 11/30- oncology consulted; plan for PET scan 12/1- lt chest tube removed  Case discussed with nurse tech, who reports that pt ate a few bites of breakfast this morning. Pt has had multiple hypoglycemic episodes per her report.   Spoke with pt, who reports tolerating current diet well, but complains of early satiety. He reports he was not hungry due to "thye gave me a lot of snacks at 3 in the morning because my sugar dropped". Pt had Ensure supplement at bedside, which he likes. Discussed importance of good meal and supplement intake to promote healing and help prevent further hypoglycemic episodes.   Labs reviewed.   Diet Order:  DIET DYS 2 Room service appropriate? Yes; Fluid consistency: Thin  EDUCATION NEEDS:   Education needs have  been addressed  Skin:  Skin Assessment: Skin Integrity Issues: Skin Integrity Issues:: Wound VAC Wound Vac: abdomen Incisions: abdomen, with wound vac  Last BM:  11/22/17 (150 ml output via colostomy)  Height:   Ht Readings from Last 1 Encounters:  11/18/17 6\' 6"  (1.981 m)    Weight:   Wt Readings from Last 1 Encounters:  11/22/17 227 lb 1.2 oz (103 kg)    Ideal Body Weight:  97 kg  BMI:  Body mass index is 26.24 kg/m.  Estimated Nutritional Needs:   Kcal:  2200-2400  Protein:  120-135 grams  Fluid:  > 2.2 L    Joseguadalupe Stan A. Jimmye Norman, RD, LDN, CDE Pager: [phone] After hours Pager: [phone]

## 2017-11-22 NOTE — Progress Notes (Signed)
Family Medicine Teaching Service Daily Progress Note Intern Pager: [phone]  Patient name: Roberto Owen Medical record number: [id number] Date of birth: May 04, 1935 Age: 82 y.o. Gender: male  Primary Care Provider: Patient, No Pcp Per Consultants: Surgery, IR, Cardiology, Oncology  Code Status: Full   Pt Overview and Major Events to Date:  11/19 - admitted for GI spasms, LOA 11/22 - allowed to eat liquid diet per Surgery 11/23 - Left chest tube placement by IR 11/25- perforated diverticuli requiring emergent colectomy  Assessment and Plan: Roberto Owen a 82 y.o.malepresenting with an in place abdominal drain due to previous episode of diverticulitis with abscess of the sigmoid colon, developed bowel perforation s/p ex-lap and coloectomy with colostomy placed and found to have colorectal carcinoma. PMH is significant forPeptic ulcer disease, history of GI bleedand previous episode of diverticulosis.  Perforated viscus s/p ex lap and colostomy (Hartmann's Procedure): He is POD#8. Surgically stable per surgical note.  - Surgery following, appreciate recommendations - Per Surgery, Vanc 1g for 3 doses every 48hrs started on 11/29. Zosyn 3.375g q8 for 14 days, started on 11/20 (day 13). - tylenol PRN - zofran PRN - D5NS at 75/hr, dysphagia diet  Colorectal Carcinoma Patient was found to have colorectal carcinoma after ex lap and colostomy. Hematology and GI oncology on board. Pathology showed T4N1 perforated adenocarcinoma. - oncology following, appreciate recommendations- plan to discuss further with patient and daughter - Hematology also on board - follow up appointment with Dr. Benay Spice on 12/10 @ 2pm   Loculated Left Pleural Effusion Chest tube in place since 11/23. CXR 12/1 stable with no significant remaining pleural effusion - Vanc/Zosyn for HCAP coverage overlaps with coverage from ex-lap and colectomy with colostomy. - Continuous pulse ox - d/c chest tube per IR  likely today or tomorrow  Hypoglycemia Stable. CBG of 83 today. Patient has had persistently low glucose readings despite eating and drinking, D50 given. No sugar lowering medications on board. Unclear etiology.  -added D5 to fluids -assess glucose on BMP  New Onset Afib now with bradycardia stable on oral amiodarone. - Cardiology has signed off and will follow up outpatient.  - Stopped Heparin due to small subdural hematomas found; neuro recs appreciated - continue amio 200mg  daily - continue Metoprolol 12.5 BID  Anemia Hemoglobin stable at 9.3 on 11/20/17, BL ~ 9.0 - Continue to monitor with CBC - per onc recs, will give IV feraheme during hospitalization and d/c w/ oral iron  Hypoalbuminemia due to Severe protein-calorie malnutrition ([place]) - Per Nutrition 63ml Prostat TID.  - dysphagia diet  AKI Appears to be in Stage 4 CKD per labs. Scr of 2.34, increased from 2.18 on 11/21/17. Baseline ~0.8. GFR stable. Renal U/S showed bilateral non-obstructive nephrolithasis. Calculi measure on the order of 8 mm each, one seen in the lower the right kidney and the second in the upper pole of left kidney. - Discontinue IVF - am BMP  Hypokalemia Stable. K 3.9 today - monitor and replete as necessary  Small Subdural Hematoma: stable CT Head on right convexity 3.9 mm subdural hematoma without change on 11/26. MRI also on 11/27 showed no acute changes, stable 3-32mm right cerebral convexity subdural hematoma. D/w neurosurgery who felt hematomas were stable and it was okay to restart anticoagulation -Continue heparin ppx for now -will consult hematology for recommendations on anticoagulation therapy   FEN/GI:Protonix, dysphagia 2 diet Prophylaxis:Heparin  Disposition: SNF vs. CIR  Subjective:  Patient today states he feels "much better". Patient states some abdominal pain but  only when coughing. Patient denies headache or dizziness. Patient denies blood in stool or any emesis.  Patient undecided on anticoagulation, but states he will think about it.   Objective: Temp:  [98.2 F (36.8 C)-98.8 F (37.1 C)] 98.8 F (37.1 C) (12/03 0600) Pulse Rate:  [61-73] 61 (12/03 0600) Resp:  [16-20] 16 (12/03 0600) BP: (114-125)/(48-55) 125/48 (12/03 0600) SpO2:  [100 %] 100 % (12/03 0600) Weight:  [227 lb 1.2 oz (103 kg)] 227 lb 1.2 oz (103 kg) (12/03 0600) Physical Exam: General: awake and alert  Cardiovascular: RRR, no MRG Respiratory: CTAB, no wheezes, rales, or rhonchi  Abdomen: soft, bowel sounds normal, colostomy in place in LUQ, incisions dressed, no signs of infection  Extremities: no edema, non tender   Laboratory: Recent Labs  Lab 11/19/17 0506 11/20/17 0423 11/22/17 0556  WBC 10.1 8.8 9.6  HGB 8.7* 9.3* 8.5*  HCT 27.2* 29.2* 26.6*  PLT 328 298 355   Recent Labs  Lab 11/20/17 0423 11/20/17 0954 11/21/17 0853 11/22/17 0556  NA 144  --  140 140  K 4.0  --  3.8 3.9  CL 122*  --  118* 117*  CO2 17*  --  19* 19*  BUN 22*  --  21* 20  CREATININE 2.04*  --  2.18* 2.34*  CALCIUM 7.7*  --  7.6* 7.7*  PROT 5.9*  --   --   --   BILITOT 0.4  --   --   --   ALKPHOS 51  --   --   --   ALT 12*  --   --   --   AST 17  --   --   --   GLUCOSE 79 111* 99 93     Imaging/Diagnostic Tests: Ct Abdomen Pelvis Wo Contrast  Result Date: 11/14/2017 CLINICAL DATA:  82 year old male presenting with signs and symptoms suspicious for potential bowel obstruction. EXAM: CT ABDOMEN AND PELVIS WITHOUT CONTRAST TECHNIQUE: Multidetector CT imaging of the abdomen and pelvis was performed following the standard protocol without IV contrast. COMPARISON:  CT the abdomen and pelvis 11/08/2017. FINDINGS: Lower chest: Pigtail drainage catheter in the lower left hemithorax posteriorly. Small left-sided pleural effusion lying dependently. Trace right pleural effusion lying dependently. Areas of apparent subsegmental atelectasis in the lower lobes of lungs bilaterally. Hepatobiliary:  Numerous tiny calcified granulomas. No other definite suspicious appearing hepatic lesions noted throughout the liver on today's noncontrast CT examination. Amorphous intermediate to high attenuation material lying dependently in the gallbladder, presumably biliary sludge. Pancreas: No definite pancreatic mass noted on today's noncontrast CT examination. Small amount of peripancreatic stranding noted, but nonspecific given generalize soft tissues trending throughout the retroperitoneum, mesenteric and diffuse body wall edema. Spleen: Unremarkable. Adrenals/Urinary Tract: Several nonobstructive calculi are noted within the collecting systems of both kidneys, measuring up to 8 mm in the lower pole collecting system of the right kidney. No ureteral stones. No hydroureteronephrosis. Bilateral adrenal glands are normal in appearance. Large right-sided bladder diverticulum with some high attenuation material lying dependently in this diverticulum, new compared to the prior examination from 11/08/2017, potentially residual contrast material or proteinaceous/hemorrhagic debris. Stomach/Bowel: Unenhanced appearance of the stomach is unremarkable. No pathologic dilatation of small bowel. Colon appears moderately distended with multiple air-fluid levels. Previously noted diverticular abscess with indwelling pigtail drainage catheter in the distal descending colon (axial image 57 of series 3) remains completely decompressed. Soft tissue stranding adjacent to the decompressed abscess is noted, as well as adjacent locules of extraluminal  gas (axial image 58 of series 3) which are new compared to the prior study. The appendix is not confidently identified and may be surgically absent. Regardless, there are no inflammatory changes noted adjacent to the cecum to suggest the presence of an acute appendicitis at this time. Vascular/Lymphatic: Aortic atherosclerosis, without evidence of aneurysm in the abdominal or pelvic vasculature.  No lymphadenopathy noted in the abdomen or pelvis on today's noncontrast CT examination. Reproductive: Prostate gland and seminal vesicles are incompletely imaged. Other: New pneumoperitoneum (moderate volume) noted. Trace volume of ascites. Diffuse mesenteric and retroperitoneal edema. Musculoskeletal: Diffuse body wall edema. There are no aggressive appearing lytic or blastic lesions noted in the visualized portions of the skeleton. IMPRESSION: 1. New pneumoperitoneum compared to the prior examination, compatible with perforated viscus. The exact source of this is uncertain, but this is suspected be related to gas traversing the chronic diverticular abscess wall at the site of the indwelling catheter. This was discussed by phone with Dr. Romana Juniper. 2. Diffuse body wall edema, mesenteric edema and retroperitoneal edema with bilateral pleural effusions, suspicious for a state of anasarca. 3. Multiple nonobstructive calculi in the collecting systems of both kidneys measuring up to 8 mm in the lower pole collecting system of the right kidney. There is also some new high attenuation material lying dependently in the large right bladder diverticulum. This could represent multiple tiny calculi, but this is unlikely given the development compared to the recent prior examination. Rather, this is favored to represent some proteinaceous/hemorrhagic debris or residual contrast material from prior CT scan 11/08/2017. 4. Aortic atherosclerosis. 5. Additional findings, as above. Aortic Atherosclerosis (ICD10-I70.0). These results were called by telephone at the time of interpretation on 11/14/2017 at 3:35 pm to Dr. Kae Heller, who verbally acknowledged these results. Electronically Signed   By: Vinnie Langton M.D.   On: 11/14/2017 15:48   Dg Chest 2 View  Result Date: 11/08/2017 CLINICAL DATA:  Pleural effusion. EXAM: CHEST  2 VIEW COMPARISON:  Radiographs of March 25, 2012. FINDINGS: Stable cardiomediastinal silhouette.  No pneumothorax is noted. Right lung is clear. Mild left basilar atelectasis or infiltrate is noted. Bony thorax is unremarkable. IMPRESSION: Mild left basilar subsegmental atelectasis or infiltrate is noted. Followup PA and lateral chest X-ray is recommended in 3-4 weeks following trial of antibiotic therapy to ensure resolution and exclude underlying malignancy. Electronically Signed   By: Marijo Conception, M.D.   On: 11/08/2017 16:46   Ct Head Wo Contrast  Result Date: 11/15/2017 CLINICAL DATA:  82 year old male post abdominal surgery yesterday. Altered level of consciousness. Not waking up. Insert sub EXAM: CT HEAD WITHOUT CONTRAST TECHNIQUE: Contiguous axial images were obtained from the base of the skull through the vertex without intravenous contrast. COMPARISON:  11/14/2017 CT. FINDINGS: Brain: Right convexity 3.9 mm subdural hematoma without change. No significant associated mass effect. No new intracranial hemorrhage seen separate from the above described finding. No CT evidence of large acute infarct or diffuse anoxia. Atrophy. No intracranial mass lesion noted on this unenhanced exam. Vascular: Vascular calcifications Skull: No acute abnormality Sinuses/Orbits: No acute orbital abnormality. Minimal mucosal thickening ethmoid sinus air cells. Other: Mastoid air cells and middle ear cavities are clear. IMPRESSION: Right convexity 3.9 mm subdural hematoma without change. No significant associated mass effect. No new intracranial hemorrhage. No CT evidence of large acute infarct or diffuse anoxia. Atrophy. Electronically Signed   By: Genia Del M.D.   On: 11/15/2017 14:04   Ct Head Wo Contrast  Result  Date: 11/14/2017 CLINICAL DATA:  82 y/o  M; altered mental status, rule out stroke. EXAM: CT HEAD WITHOUT CONTRAST TECHNIQUE: Contiguous axial images were obtained from the base of the skull through the vertex without intravenous contrast. COMPARISON:  None. FINDINGS: Brain: No evidence of acute  infarction, hemorrhage, hydrocephalus or mass lesion/mass effect. Mild chronic microvascular ischemic changes and parenchymal volume loss of the brain for age. 4 mm trace subdural collection in the right cerebral convexity with intermittent attenuation, probably subdural hematoma (series 10, image 39). No significant mass effect. Vascular: Calcific atherosclerosis of carotid siphons. No hyperdense vessel. Skull: Normal. Negative for fracture or focal lesion. Sinuses/Orbits: Mild ethmoid sinus mucosal thickening. Otherwise negative. Other: None. IMPRESSION: 1. Thin subdural collection over right cerebral convexity, likely subdural hematoma. No significant mass effect. 2. No evidence for stroke, brain parenchymal hemorrhage, or mass effect. 3. Mild chronic microvascular ischemic changes and parenchymal volume loss of the brain. Critical Value/emergent results were called by telephone at the time of interpretation on 11/14/2017 at 3:34 pm to Dr. Kae Heller, who verbally acknowledged these results. Electronically Signed   By: Kristine Garbe M.D.   On: 11/14/2017 15:31   Ct Chest W Contrast  Result Date: 11/10/2017 CLINICAL DATA:  Pleural effusions. EXAM: CT CHEST WITH CONTRAST TECHNIQUE: Multidetector CT imaging of the chest was performed during intravenous contrast administration. CONTRAST:  93mL ISOVUE-300 IOPAMIDOL (ISOVUE-300) INJECTION 61% COMPARISON:  Two-view chest x-ray 11/08/2017 FINDINGS: Cardiovascular: The heart is enlarged. Coronary artery calcifications are present. Pulmonary artery is enlarged. The main pulmonary outflow tract measures 3.9 cm. The aorta is normal in size. Mediastinum/Nodes: Hyperdense material is present within the left hilum. This is greater than expected for calcification appears be some sort of embolic material. Lungs/Pleura: A loculated left lower lobe fusion is present. There is a large portion posteriorly inferiorly. A second loculated portion along the left major  fissure. Right-sided pleural fluid is less certain to be loculated. There is significant volume loss in the left greater than right lower lobes. The upper lung fields are clear. Upper Abdomen: Unremarkable. 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 Vertebral body heights are otherwise normal. Rightward curvature of the upper thoracic spine is associated with the butterfly vertebral body. No focal lytic or blastic lesions are present. IMPRESSION: 1. Loculated bilateral pleural effusions, left greater than right. 2. Associated airspace disease likely reflects atelectasis. 3. Additional pleural irregularity on the left may represent infection associated with the fusion. 4. 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 Dextroconvex curvature is associated. Electronically Signed   By: San Morelle M.D.   On: 11/10/2017 15:19   Mr Brain Wo Contrast  Result Date: 11/16/2017 CLINICAL DATA:  82 y/o M; asymmetry limb movement abnormality of uncertain etiology. History of subdural hematoma. EXAM: MRI HEAD WITHOUT CONTRAST TECHNIQUE: Multiplanar, multiecho pulse sequences of the brain and surrounding structures were obtained without intravenous contrast. COMPARISON:  11/15/2017 CT head. FINDINGS: Brain: No reduced diffusion to suggest acute or early subacute infarction. Intermediate T1 and high T2 signal acute 3-4 mm subdural hematoma over right cerebral convexity is stable. No significant mass effect. No new intracranial hemorrhage. Mild chronic microvascular ischemic changes of white matter and parenchymal volume loss of the brain. 4 mm right lateral ventricle nodule along the under belly of corpus callosum with increased T2 and intermediate T1 signal (series 11, image 17 and series 10, image 17). Vascular: Normal flow voids. Skull  and upper cervical spine: Normal marrow signal. Sinuses/Orbits: Negative. Other: None.  IMPRESSION: 1. No new acute intracranial abnormality. 2. Stable 3-4 mm right cerebral convexity subdural hematoma. 3. Mild chronic microvascular ischemic changes and mild parenchymal volume loss of the brain. 4. 4 mm right lateral ventricle nodule on under belly of corpus callosum, likely benign. Comparison with prior MRI imaging where available is recommended. Otherwise if clinically indicated consider 6-12 month follow-up to evaluate for stability. Electronically Signed   By: Kristine Garbe M.D.   On: 11/16/2017 14:53   Ct Abdomen Pelvis W Contrast  Result Date: 11/08/2017 CLINICAL DATA:  82 year old male. Left lower quadrant abscess secondary to diverticular disease in September with percutaneous drain placement. Subsequent feculent drainage, with fluoro guided injection demonstrating fistula to the colon. Drain occlusion on 10/28/2017. Up size an exchange for a new 14 [place] drain at that time. 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 Recent decreased drainage volume from catheter. Loss of appetite for 2 days with vomiting. EXAM: CT ABDOMEN AND PELVIS WITH CONTRAST TECHNIQUE: Multidetector CT imaging of the abdomen and pelvis was performed using the standard protocol following bolus administration of intravenous contrast. CONTRAST:  16mL ISOVUE-300 IOPAMIDOL (ISOVUE-300) INJECTION 61% COMPARISON:  Drain replacement images [id number]. CT Abdomen and Pelvis 09/30/2017. FINDINGS: Lower chest: New loculated appearing small to moderate size left pleural effusion with simple fluid density (series 3, image 6). Associated left lung base compressive atelectasis. No definite lower lobe or lingula consolidation. No pericardial effusion.  Stable and negative right lung base. Hepatobiliary: Stable and negative, scattered punctate calcified granulomas. Pancreas: Negative. Spleen: Negative. Adrenals/Urinary Tract: Normal adrenal glands. Bilateral renal enhancement and contrast excretion is symmetric and within  normal limits. No hydronephrosis or hydroureter. 5 cm posterior right urinary bladder diverticulum appears unchanged. Stable and negative urinary bladder otherwise. Stomach/Bowel: Stable and negative rectum. Redundant sigmoid colon with persistent circumferential wall thickening in the proximal sigmoid just distal to the left abdominal percutaneous pigtail drain site (series 3, image 56). Adjacent left gutter/mesenteric stranding or trace fluid has not resolved since October and may be mildly increased (series 3, image 58). No extraluminal gas identified. The distal sigmoid appears stable and negative. The descending colon demonstrates increased distention with low-density stool to the splenic flexure. The lumen of the descending colon becomes narrow at the level of the pigtail drain and sigmoid wall thickening. See coronal images 57 52 through 58. Distal descending colon wall thickening. Redundant transverse colon. No retained stool at the splenic flexure. Small volume of fluid in the right colon. Respiratory motion artifact in the right lower quadrant. The cecum is on a lax mesentery. The appendix is not identified today. Nondilated distal small bowel. No dilated small bowel loops. Decompressed stomach and duodenum. No other abdominal free fluid. Vascular/Lymphatic: Mild for age atherosclerosis. Major arterial structures are patent. Portal venous system appears patent. Stable lymph nodes. No lymphadenopathy. Reproductive: Negative. Other: No pelvic free fluid. Musculoskeletal: Stable.  No acute osseous abnormality identified. IMPRESSION: 1. The position of the left abdominal percutaneous pigtail catheter appears stable since October. No residual abscess but continued and perhaps progressive circumferential colonic wall thickening and mesenteric inflammation near the catheter. 2. Increased low-density stool distending the upstream left colon and splenic flexure raising the possibility of partial obstruction. 3.  Consider Colonoscopy: The persistent colonic wall thickening in #1 may be inflammatory but colonic tumor is difficult to exclude. 4. New since October small to moderate size loculated left pleural effusion. Associated lung base atelectasis  without definite pneumonia. Electronically Signed   By: Genevie Ann M.D.   On: 11/08/2017 15:30   Ir Catheter Tube Change  Result Date: 10/28/2017 INDICATION: 82 year old male with a history of left lower quadrant abscess secondary to diverticular disease. Drain placed 09/16/2017. The patient has had feculent drainage, with fluoro guided injection demonstrating fistula to the colon. Current drain has become occluded. EXAM: IR CATHETER TUBE CHANGE MEDICATIONS: None ANESTHESIA/SEDATION: None COMPLICATIONS: None PROCEDURE: Informed written consent was obtained from the patient after a thorough discussion of the procedural risks, benefits and alternatives. All questions were addressed. Maximal Sterile Barrier Technique was utilized including caps, mask, sterile gowns, sterile gloves, sterile drape, hand hygiene and skin antiseptic. A timeout was performed prior to the initiation of the procedure. Patient positioned supine position on the fluoroscopy table. The left lower quadrant and drain were prepped and draped in the usual sterile fashion. 1% lidocaine was used for local anesthesia. Attempted drain flush proved blockage of the tube. Small amount contrast entered the cavity, confirming persisting fistula. Modified Seldinger technique was used to place a new 14 [place] drain. Drain was sutured in position. Drain attached to gravity drainage. Patient tolerated the procedure well and remained hemodynamically stable throughout. No complications were encountered and no significant blood loss. IMPRESSION: Status post upsized and exchange for a new 14 French drain into a abscess cavity of the left lower quadrant. Injection confirms persisting fistula to the colon. Signed, Dulcy Fanny. Earleen Newport,  DO [facility] Radiology Electronically Signed   By: Corrie Mckusick D.O.   On: 10/28/2017 12:17   US Renal  Result Date: 11/15/2017 CLINICAL DATA:  Acute renal injury, postop exploratory laparotomy. EXAM: RENAL / URINARY TRACT ULTRASOUND COMPLETE COMPARISON:  CT 11/14/2017. FINDINGS: Right Kidney: Length: 12.6 cm. Echogenicity within normal limits. There is an 8 mm calculus in the lower pole. No mass or hydronephrosis visualized. Left Kidney: Length: 11.8 cm. Echogenicity within normal limits. Nonobstructing 8 mm calculus in the left upper pole. No mass or hydronephrosis visualized. Bladder: Decompressed by Foley catheter. Other: Small amount of perihepatic fluid is seen. IMPRESSION: 1. Bilateral nephrolithiasis without obstructive uropathy. Calculi measure on the order of 8 mm each, one seen in the lower the right kidney and the second in the upper pole of left kidney. 2. No loss of cortical-medullary distinction of either kidney. 3. Foley decompressed urinary bladder. 4. Small amount of perihepatic free fluid. Electronically Signed   By: Ashley Royalty M.D.   On: 11/15/2017 00:20   Dg Chest Port 1 View  Result Date: 11/20/2017 CLINICAL DATA:  Evaluation after placement of left basilar chest tube to water seal. Original placement of pigtail chest tube on 11/12/2017 to treat loculated basilar pleural effusion. EXAM: PORTABLE CHEST 1 VIEW COMPARISON:  11/17/2017 FINDINGS: Interval extubation and removal of nasogastric tube. Left basilar chest tube shows stable course and positioning. No significant residual pleural effusion. Mild bibasilar atelectasis. No pneumothorax. Stable heart size. IMPRESSION: No pneumothorax. No significant residual left-sided pleural effusion. Electronically Signed   By: Aletta Edouard M.D.   On: 11/20/2017 15:02   Dg Chest Port 1 View  Result Date: 11/17/2017 CLINICAL DATA:  Intubated EXAM: PORTABLE CHEST 1 VIEW COMPARISON:   11/14/2017 FINDINGS: Support devices are stable. Left basilar chest tube remains in place. No pneumothorax. Heart is borderline in size. Left base atelectasis, improving since prior study. IMPRESSION: Stable support devices.  No pneumothorax. Left base atelectasis, improving since prior study. Electronically Signed  By: Rolm Baptise M.D.   On: 11/17/2017 09:01   Portable Chest Xray  Result Date: 11/14/2017 CLINICAL DATA:  Post op bowel resections EXAM: PORTABLE CHEST 1 VIEW COMPARISON:  11/14/2017 FINDINGS: Endotracheal tube tip is about 8.2 cm superior to carina. Esophageal tube tip is below the diaphragm. Left lower chest drainage catheter as before. Small pleural effusions. Mild cardiomegaly. No pneumothorax. Mild atelectasis or focus of infiltrate in the left mid lung. Left lung base consolidation. Left-sided central venous catheter tip overlies the brachiocephalic confluence. No pneumothorax. IMPRESSION: 1. Endotracheal tube tip about 8.2 cm superior to carina 2. Small left effusion with left lower lobe atelectasis or infiltrate, slight worsening. 3. Mild cardiomegaly Electronically Signed   By: Donavan Foil M.D.   On: 11/14/2017 23:03   Dg Chest Port 1 View  Result Date: 11/14/2017 CLINICAL DATA:  82 year old male with altered mental status. EXAM: PORTABLE CHEST 1 VIEW COMPARISON:  11/14/2017. FINDINGS: Cardiomediastinal silhouette enlarged but stable. There is mild pulmonary vascular congestion without frank edema. No focal parenchymal consolidation. Likely small left pleural effusion and associated atelectasis, superimposed consolidation not excluded. No pneumothorax identified. Pigtail catheter overlies the left upper quadrant. No acute osseous abnormalities. IMPRESSION: Grossly stable appearance of the chest with small left basilar effusion/atelectasis, superimposed consolidation not excluded. Electronically Signed   By: Kristopher Oppenheim M.D.   On: 11/14/2017 13:39   Dg Chest Port 1  View  Result Date: 11/14/2017 CLINICAL DATA:  Left pleural effusion. EXAM: PORTABLE CHEST 1 VIEW COMPARISON:  Radiographs of November 08, 2017. FINDINGS: Stable cardiomediastinal silhouette. No pneumothorax is noted. Right lung is clear. Mild left basilar atelectasis or infiltrate is noted with possible minimal left pleural effusion. Bony thorax is unremarkable. IMPRESSION: Mild left basilar atelectasis or infiltrate is noted with minimal left pleural effusion. Electronically Signed   By: Marijo Conception, M.D.   On: 11/14/2017 07:30   Dg Swallowing Func-speech Pathology  Result Date: 11/19/2017 Objective Swallowing Evaluation: Type of Study: Bedside Swallow Evaluation  Patient Details Name: Tamar Lipscomb MRN: [id number] Date of Birth: 03/22/1935 Today's Date: 11/19/2017 Time: SLP Start Time (ACUTE ONLY): 1158 -SLP Stop Time (ACUTE ONLY): 6962 SLP Time Calculation (min) (ACUTE ONLY): 16 min Past Medical History: Past Medical History: Diagnosis Date . Anemia  . Bladder diverticulum 11/10/2017 . Esophageal ulcer   hx/notes 09/15/2017 . Gastric ulcer   hx/notes 09/15/2017 . GI bleed  . History of blood transfusion 07/2017 . NSAID induced gastritis  Past Surgical History: Past Surgical History: Procedure Laterality Date . COLONOSCOPY  ~ 05/2016 . ESOPHAGOGASTRODUODENOSCOPY (EGD) WITH PROPOFOL N/A 07/25/2017  Procedure: ESOPHAGOGASTRODUODENOSCOPY (EGD) WITH PROPOFOL;  Surgeon: Mauri Pole, MD;  Location: [facility];  Service: Endoscopy;  Laterality: N/A; . IR CATHETER TUBE CHANGE  10/28/2017 . IR RADIOLOGIST EVAL & MGMT  09/30/2017 . IR RADIOLOGIST EVAL & MGMT  10/19/2017 . LAPAROTOMY N/A 11/14/2017  Procedure: EXPLORATORY LAPAROTOMY WITH HARTMANN PROCEDURE;  Surgeon: Clovis Riley, MD;  Location: [facility];  Service: General;  Laterality: N/A; HPI: Pt is an 82 year old male with a known peptic ulcer disease, history of GI bleed, diverticulosis who presents with draining around indwelling catheter secondary to  diverticulitis with abscess who was brought to the ED with increasing weight loss, GI spasms, cough plus leaking around his diverticular catheter site. Pt now s/p ex lap with partial sigmoid colectomy on 11/25. Of note, pt was intubated from 11/25-11/28.  Subjective: pt alert but not a great historian, provides inconsistent descriptions Assessment / Plan /  Recommendation CHL IP CLINICAL IMPRESSIONS 11/19/2017 Clinical Impression -- SLP Visit Diagnosis -- Attention and concentration deficit following -- Frontal lobe and executive function deficit following -- Impact on safety and function (No Data)   CHL IP TREATMENT RECOMMENDATION 11/19/2017 Treatment Recommendations Therapy as outlined in treatment plan below   Prognosis 11/19/2017 Prognosis for Safe Diet Advancement Good Barriers to Reach Goals Cognitive deficits Barriers/Prognosis Comment -- CHL IP DIET RECOMMENDATION 11/19/2017 SLP Diet Recommendations Dysphagia 2 (Fine chop) solids;Thin liquid Liquid Administration via Cup;No straw Medication Administration Whole meds with puree Compensations Small sips/bites;Minimize environmental distractions;Slow rate;Clear throat intermittently Postural Changes Seated upright at 90 degrees   CHL IP OTHER RECOMMENDATIONS 11/19/2017 Recommended Consults -- Oral Care Recommendations Oral care BID Other Recommendations --   CHL IP FOLLOW UP RECOMMENDATIONS 11/19/2017 Follow up Recommendations (No Data)   CHL IP FREQUENCY AND DURATION 11/19/2017 Speech Therapy Frequency (ACUTE ONLY) min 2x/week Treatment Duration 2 weeks      CHL IP ORAL PHASE 11/19/2017 Oral Phase Impaired Oral - Pudding Teaspoon -- Oral - Pudding Cup -- Oral - Honey Teaspoon -- Oral - Honey Cup -- Oral - Nectar Teaspoon -- Oral - Nectar Cup -- Oral - Nectar Straw -- Oral - Thin Teaspoon -- Oral - Thin Cup Delayed oral transit;Premature spillage Oral - Thin Straw Delayed oral transit Oral - Puree Delayed oral transit Oral - Mech Soft -- Oral - Regular Delayed  oral transit Oral - Multi-Consistency -- Oral - Pill -- Oral Phase - Comment --  CHL IP PHARYNGEAL PHASE 11/19/2017 Pharyngeal Phase Impaired Pharyngeal- Pudding Teaspoon -- Pharyngeal -- Pharyngeal- Pudding Cup -- Pharyngeal -- Pharyngeal- Honey Teaspoon -- Pharyngeal -- Pharyngeal- Honey Cup -- Pharyngeal -- Pharyngeal- Nectar Teaspoon -- Pharyngeal -- Pharyngeal- Nectar Cup -- Pharyngeal -- Pharyngeal- Nectar Straw -- Pharyngeal -- Pharyngeal- Thin Teaspoon -- Pharyngeal -- Pharyngeal- Thin Cup Penetration/Aspiration during swallow;Reduced airway/laryngeal closure;Penetration/Aspiration before swallow;Pharyngeal residue - pyriform Pharyngeal Material enters airway, remains ABOVE vocal cords and not ejected out Pharyngeal- Thin Straw Penetration/Aspiration during swallow;Reduced airway/laryngeal closure Pharyngeal Material enters airway, remains ABOVE vocal cords and not ejected out Pharyngeal- Puree WFL Pharyngeal -- Pharyngeal- Mechanical Soft -- Pharyngeal -- Pharyngeal- Regular WFL Pharyngeal -- Pharyngeal- Multi-consistency -- Pharyngeal -- Pharyngeal- Pill -- Pharyngeal -- Pharyngeal Comment --  CHL IP CERVICAL ESOPHAGEAL PHASE 11/19/2017 Cervical Esophageal Phase (No Data) Pudding Teaspoon -- Pudding Cup -- Honey Teaspoon -- Honey Cup -- Nectar Teaspoon -- Nectar Cup -- Nectar Straw -- Thin Teaspoon -- Thin Cup -- Thin Straw -- Puree -- Mechanical Soft -- Regular -- Multi-consistency -- Pill -- Cervical Esophageal Comment -- No flowsheet data found. Houston Siren 11/19/2017, 1:24 PM              Ct Perc Pleural Drain W/[place]well Cath W/img Guide  Result Date: 11/12/2017 INDICATION: 82 year old with a loculated left pleural effusion. Plan for CT-guided chest tube placement. EXAM: CT-GUIDED PLACEMENT OF LEFT CHEST TUBE MEDICATIONS: None ANESTHESIA/SEDATION: Fentanyl 50 mcg IV; Versed 1.0 mg IV Moderate Sedation Time:  24 minutes The patient was continuously monitored during the procedure by the  interventional radiology nurse under my direct supervision. COMPLICATIONS: None immediate. PROCEDURE: Informed written consent was obtained from the patient after a thorough discussion of the procedural risks, benefits and alternatives. All questions were addressed. A timeout was performed prior to the initiation of the procedure. Patient was placed prone. CT images through the chest were obtained. Posterior loculated left pleural effusion was targeted. The left side of the back  was prepped and draped in sterile fashion. Skin and soft tissues were anesthetized with 1% lidocaine. 18 gauge trocar needle was directed into the pleural space with CT guidance. Clear yellow fluid was aspirated. Stiff Amplatz wire was placed. Tract was dilated to accommodate a 12 [place] multipurpose drain. Drain was advanced over the wire. Clear yellow fluid was aspirated and sent for culture. Catheter was sutured to the skin and attached to a pleural fluid evacuation device. FINDINGS: Loculated effusion along the posterior left lower chest. IMPRESSION: CT-guided placement of a chest tube within the loculated left pleural effusion. Electronically Signed   By: Markus Daft M.D.   On: 11/12/2017 15:10     Caroline More, DO 11/22/2017, 11:58 AM PGY-1, [place] Intern pager: [phone], text pages welcome

## 2017-11-22 NOTE — Progress Notes (Signed)
Pt had CBG of 60, pt has been very the past couple days in the 30's 2 night in a row, pt had pudding and 4 oz of juice and brought it back to 75, will continue to monitor, Thanks Arvella Nigh RN

## 2017-11-22 NOTE — Progress Notes (Signed)
  Speech Language Pathology Treatment: Dysphagia  Patient Details Name: Roberto Owen MRN: [id number] DOB: [birth year] Today's Date: 11/22/2017 Time: 7672-0947 SLP Time Calculation (min) (ACUTE ONLY): 15 min  Assessment / Plan / Recommendation Clinical Impression  Pt seen at bedside to follow up after BSE/MBS completed 11/19/17. Pt awake, pleasant and cooperative. Pt was observed with trials of thin liquid via cup sip, and graham cracker. Pt appeared to tolerate these consistencies, without overt s/s aspiration. He reports eating graham crackers and peanut butter as a snack at bedtime. When SLP asked pt about advancing diet to Dys 3 (mech soft solids), pt indicated he prefers to stay with the Dys 2 (finely chopped) consistency, as his poor dentition makes adequate mastication challenging. Will continue current diet (dys2/thin - no straws) per pt preference. Safe swallow precautions posted at [facility], and pt aware. ST will follow briefly for diet tolerance and education.     HPI HPI: Pt is an 82 year old male with a known peptic ulcer disease, history of GI bleed, diverticulosis who presents with draining around indwelling catheter secondary to diverticulitis with abscess who was brought to the ED with increasing weight loss, GI spasms, cough plus leaking around his diverticular catheter site. Pt now s/p ex lap with partial sigmoid colectomy on 11/25. Of note, pt was intubated from 11/25-11/28.      SLP Plan  Continue with current plan of care       Recommendations  Diet recommendations: Dysphagia 2 (fine chop);Thin liquid Liquids provided via: No straw;Cup Medication Administration: Whole meds with puree Supervision: Patient able to self feed;Intermittent supervision to cue for compensatory strategies Compensations: Small sips/bites;Minimize environmental distractions;Slow rate;Clear throat intermittently Postural Changes and/or Swallow Maneuvers: Seated upright 90 degrees;Upright 30-60 min  after meal                Oral Care Recommendations: Oral care QID Follow up Recommendations: None SLP Visit Diagnosis: Dysphagia, oropharyngeal phase (R13.12) Plan: Continue with current plan of care       GO              Celia B. Quentin Ore [facility], CCC-SLP Speech Language Pathologist [phone]  Shonna Chock 11/22/2017, 3:22 PM

## 2017-11-22 NOTE — Plan of Care (Signed)
  Progressing SLP Dysphagia Goals Patient will utilize recommended strategies Description Patient will utilize recommended strategies during swallow to increase swallowing safety with 11/22/2017 1528 - Progressing by Colon Flattery B, CCC-SLP Flowsheets Taken 11/22/2017 1528  Patient will utilize recommended strategies during swallow to increase swallowing safety with  min assist Misc Dysphagia Goal 11/22/2017 1528 - Progressing by Colon Flattery B, CCC-SLP Flowsheets Taken 11/22/2017 1528  Misc Dysphagia Goal  Pt will tolerate least restrictive diet without overt s/s aspiration or decline in respiratory status

## 2017-11-22 NOTE — Telephone Encounter (Signed)
Appt has been scheduled for the pt to see Dr. Benay Spice on 12/10 at 2pm. Pt is currently in the hospital.

## 2017-11-23 ENCOUNTER — Encounter: Payer: Self-pay | Admitting: Gastroenterology

## 2017-11-23 LAB — GLUCOSE, CAPILLARY
GLUCOSE-CAPILLARY: 48 mg/dL — AB (ref 65–99)
GLUCOSE-CAPILLARY: 53 mg/dL — AB (ref 65–99)
GLUCOSE-CAPILLARY: 71 mg/dL (ref 65–99)
GLUCOSE-CAPILLARY: 72 mg/dL (ref 65–99)
GLUCOSE-CAPILLARY: 74 mg/dL (ref 65–99)
GLUCOSE-CAPILLARY: 80 mg/dL (ref 65–99)
GLUCOSE-CAPILLARY: 87 mg/dL (ref 65–99)
Glucose-Capillary: 43 mg/dL — CL (ref 65–99)
Glucose-Capillary: 52 mg/dL — ABNORMAL LOW (ref 65–99)
Glucose-Capillary: 56 mg/dL — ABNORMAL LOW (ref 65–99)
Glucose-Capillary: 28 mg/dL — CL (ref 65–99)
Glucose-Capillary: 41 mg/dL — CL (ref 65–99)
Glucose-Capillary: 120 mg/dL — ABNORMAL HIGH (ref 65–99)

## 2017-11-23 LAB — BASIC METABOLIC PANEL
ANION GAP: 8 (ref 5–15)
Anion gap: 7 (ref 5–15)
BUN: 20 mg/dL (ref 6–20)
BUN: 22 mg/dL — AB (ref 6–20)
CALCIUM: 7.9 mg/dL — AB (ref 8.9–10.3)
CHLORIDE: 111 mmol/L (ref 101–111)
CHLORIDE: 112 mmol/L — AB (ref 101–111)
CO2: 20 mmol/L — AB (ref 22–32)
CO2: 18 mmol/L — ABNORMAL LOW (ref 22–32)
Calcium: 7.8 mg/dL — ABNORMAL LOW (ref 8.9–10.3)
Creatinine, Ser: 2.46 mg/dL — ABNORMAL HIGH (ref 0.61–1.24)
Creatinine, Ser: 2.73 mg/dL — ABNORMAL HIGH (ref 0.61–1.24)
GFR calc Af Amer: 24 mL/min — ABNORMAL LOW (ref >60)
GFR calc non Af Amer: 23 mL/min — ABNORMAL LOW (ref >60)
GFR calc non Af Amer: 20 mL/min — ABNORMAL LOW (ref >60)
GFR, EST AFRICAN AMERICAN: 27 mL/min — AB (ref >60)
GLUCOSE: 168 mg/dL — AB (ref 65–99)
Glucose, Bld: 82 mg/dL (ref 65–99)
POTASSIUM: 4.1 mmol/L (ref 3.5–5.1)
Potassium: 4.3 mmol/L (ref 3.5–5.1)
SODIUM: 137 mmol/L (ref 135–145)
Sodium: 139 mmol/L (ref 135–145)

## 2017-11-23 LAB — CORTISOL: CORTISOL PLASMA: 15.8 ug/dL

## 2017-11-23 MED ORDER — GLUCOSE 40 % PO GEL
ORAL | Status: AC
  Administered 2017-11-23: 18.75 g
  Filled 2017-11-23: qty 1

## 2017-11-23 MED ORDER — DEXTROSE 50 % IV SOLN
INTRAVENOUS | Status: AC
  Administered 2017-11-23: 25 mL
  Filled 2017-11-23: qty 50

## 2017-11-23 NOTE — Progress Notes (Signed)
Family Medicine Teaching Service Daily Progress Note Intern Pager: [phone]  Patient name: Roberto Owen Medical record number: [id number] Date of birth: 12/08/1935 Age: 82 y.o. Gender: male  Primary Care Provider: Patient, No Pcp Per Consultants: Surgery, IR, Cardiology, Oncology  Code Status: Full   Pt Overview and Major Events to Date:  11/19 - admitted for GI spasms, LOA 11/22 - allowed to eat liquid diet per Surgery 11/23 - Left chest tube placement by IR 11/25- perforated diverticuli requiring emergent colectomy  Assessment and Plan: Roberto Reynoldsis a 82 y.o.malepresenting with an in place abdominal drain due to previous episode of diverticulitis with abscess of the sigmoid colon, developed bowel perforation s/p ex-lap and coloectomy with colostomy placed and found to have colorectal carcinoma. PMH is significant forPeptic ulcer disease, history of GI bleedand previous episode of diverticulosis.  Perforated viscus s/p ex lap and colostomy (Hartmann's Procedure): He is POD#9. Surgically stable per surgical note.  - Surgery following, appreciate recommendations - Per Surgery, last day of antibiotics. Vanc 1g for 3 doses every 48hrs started on 11/29. Zosyn 3.375g q8 for 14 days, started on 11/20 (day 14).  - tylenol PRN - zofran PRN - dysphagia diet - PT/OT follow up to determine placement of CIR vs SNF  Colorectal Carcinoma Patient was found to have colorectal carcinoma after ex lap and colostomy. Hematology and GI oncology on board. Pathology showed T4N1 perforated adenocarcinoma. - oncology following, appreciate recommendations- plan to discuss further with patient and daughter - Hematology also on board - follow up appointment with Dr. Benay Spice on 12/10 @ 2pm   Loculated Left Pleural Effusion Chest tube removed per IR on 12/2. CXR 12/1 stable with no significant remaining pleural effusion - Vanc/Zosyn for HCAP coverage overlaps with coverage from ex-lap and  colectomy with colostomy. Abx stopping today - Continuous pulse ox  Hypoglycemia Stable for the past 24hrs with CBGs of 63-93. Patient has had persistently low glucose readings despite eating and drinking, D50 given. No sugar lowering medications on board. Unclear etiology.  -assess glucose on BMP -low glucose protocol  New Onset Afib now with bradycardia stable on oral amiodarone. - Cardiology has signed off and will follow up outpatient.  - Stopped Heparin due to small subdural hematomas found; neuro recs appreciated - continue amio 200mg  daily - continue Metoprolol 12.5 BID  Anemia Hemoglobin stable at 9.3 on 11/20/17, BL ~ 9.0 - Continue to monitor with CBC - per onc recs, will give IV feraheme during hospitalization and d/c w/ oral iron  Hypoalbuminemia due to Severe protein-calorie malnutrition ([place]) - Per Nutrition 30ml Prostat TID.  - dysphagia diet  AKI Appears to be in Stage 4 CKD per labs. Scr of 2.46, increased from 2.34 on 11/22/17. Baseline ~0.8. GFR stable. Renal U/S showed bilateral non-obstructive nephrolithasis. Calculi measure on the order of 8 mm each, one seen in the lower the right kidney and the second in the upper pole of left kidney. - am BMP to monitor Cr  Hypokalemia Stable. K 4.3 today - monitor and replete as necessary  Small Subdural Hematoma: stable CT Head on right convexity 3.9 mm subdural hematoma without change on 11/26. MRI also on 11/27 showed no acute changes, stable 3-36mm right cerebral convexity subdural hematoma. D/w neurosurgery who felt hematomas were stable and it was okay to restart anticoagulation -Continue heparin ppx for now -Hematology recommended starting elliquis   FEN/GI:Protonix, dysphagia 2 diet Prophylaxis:Heparin  Disposition: SNF vs. CIR  Subjective:  Patient today states he feels good and  only has some mild pain in his left lower abdominal quadrant only when he coughs. Otherwise, he feels good and is excited  for rehab.  Objective: Temp:  [98 F (36.7 C)-98.9 F (37.2 C)] 98.9 F (37.2 C) (12/04 0506) Pulse Rate:  [74-75] 75 (12/04 0506) Resp:  [16-18] 18 (12/04 0506) BP: (110-152)/(49-62) 123/62 (12/04 0506) SpO2:  [100 %] 100 % (12/04 0506) Weight:  [226 lb 3.1 oz (102.6 kg)] 226 lb 3.1 oz (102.6 kg) (12/04 0506) Physical Exam: Gen: Alert and Oriented x 3, NAD HEENT: Normocephalic, atraumatic, PERRLA, EOMI,  CV: Irregularly irregular rhythm, no murmurs, normal S1, S2 split, +2 pulses dorsalis pedis bilaterallyResp: CTAB, no wheezing, rales, or rhonchi, comfortable work of breathing Abd: non-distended, non-tender, soft, +bs in all four quadrants, midline incision clean no signs of infection, colostomy bag in place in LUQ MSK: FROM in all four extremities Ext: no clubbing, cyanosis, or edema Skin: warm, dry, intact, no rashes  Laboratory: Recent Labs  Lab 11/19/17 0506 11/20/17 0423 11/22/17 0556  WBC 10.1 8.8 9.6  HGB 8.7* 9.3* 8.5*  HCT 27.2* 29.2* 26.6*  PLT 328 298 355   Recent Labs  Lab 11/20/17 0423  11/21/17 0853 11/22/17 0556 11/23/17 0510  NA 144  --  140 140 139  K 4.0  --  3.8 3.9 4.3  CL 122*  --  118* 117* 111  CO2 17*  --  19* 19* 20*  BUN 22*  --  21* 20 20  CREATININE 2.04*  --  2.18* 2.34* 2.46*  CALCIUM 7.7*  --  7.6* 7.7* 7.9*  PROT 5.9*  --   --   --   --   BILITOT 0.4  --   --   --   --   ALKPHOS 51  --   --   --   --   ALT 12*  --   --   --   --   AST 17  --   --   --   --   GLUCOSE 79   < > 99 93 82   < > = values in this interval not displayed.     Imaging/Diagnostic Tests: Ct Abdomen Pelvis Wo Contrast  Result Date: 11/14/2017 CLINICAL DATA:  82 year old male presenting with signs and symptoms suspicious for potential bowel obstruction. EXAM: CT ABDOMEN AND PELVIS WITHOUT CONTRAST TECHNIQUE: Multidetector CT imaging of the abdomen and pelvis was performed following the standard protocol without IV contrast. COMPARISON:  CT the abdomen and  pelvis 11/08/2017. FINDINGS: Lower chest: Pigtail drainage catheter in the lower left hemithorax posteriorly. Small left-sided pleural effusion lying dependently. Trace right pleural effusion lying dependently. Areas of apparent subsegmental atelectasis in the lower lobes of lungs bilaterally. Hepatobiliary: Numerous tiny calcified granulomas. No other definite suspicious appearing hepatic lesions noted throughout the liver on today's noncontrast CT examination. Amorphous intermediate to high attenuation material lying dependently in the gallbladder, presumably biliary sludge. Pancreas: No definite pancreatic mass noted on today's noncontrast CT examination. Small amount of peripancreatic stranding noted, but nonspecific given generalize soft tissues trending throughout the retroperitoneum, mesenteric and diffuse body wall edema. Spleen: Unremarkable. Adrenals/Urinary Tract: Several nonobstructive calculi are noted within the collecting systems of both kidneys, measuring up to 8 mm in the lower pole collecting system of the right kidney. No ureteral stones. No hydroureteronephrosis. Bilateral adrenal glands are normal in appearance. Large right-sided bladder diverticulum with some high attenuation material lying dependently in this diverticulum, new compared to the prior  examination from 11/08/2017, potentially residual contrast material or proteinaceous/hemorrhagic debris. Stomach/Bowel: Unenhanced appearance of the stomach is unremarkable. No pathologic dilatation of small bowel. Colon appears moderately distended with multiple air-fluid levels. Previously noted diverticular abscess with indwelling pigtail drainage catheter in the distal descending colon (axial image 57 of series 3) remains completely decompressed. Soft tissue stranding adjacent to the decompressed abscess is noted, as well as adjacent locules of extraluminal gas (axial image 58 of series 3) which are new compared to the prior study. The appendix  is not confidently identified and may be surgically absent. Regardless, there are no inflammatory changes noted adjacent to the cecum to suggest the presence of an acute appendicitis at this time. Vascular/Lymphatic: Aortic atherosclerosis, without evidence of aneurysm in the abdominal or pelvic vasculature. No lymphadenopathy noted in the abdomen or pelvis on today's noncontrast CT examination. Reproductive: Prostate gland and seminal vesicles are incompletely imaged. Other: New pneumoperitoneum (moderate volume) noted. Trace volume of ascites. Diffuse mesenteric and retroperitoneal edema. Musculoskeletal: Diffuse body wall edema. There are no aggressive appearing lytic or blastic lesions noted in the visualized portions of the skeleton. IMPRESSION: 1. New pneumoperitoneum compared to the prior examination, compatible with perforated viscus. The exact source of this is uncertain, but this is suspected be related to gas traversing the chronic diverticular abscess wall at the site of the indwelling catheter. This was discussed by phone with Dr. Romana Juniper. 2. Diffuse body wall edema, mesenteric edema and retroperitoneal edema with bilateral pleural effusions, suspicious for a state of anasarca. 3. Multiple nonobstructive calculi in the collecting systems of both kidneys measuring up to 8 mm in the lower pole collecting system of the right kidney. There is also some new high attenuation material lying dependently in the large right bladder diverticulum. This could represent multiple tiny calculi, but this is unlikely given the development compared to the recent prior examination. Rather, this is favored to represent some proteinaceous/hemorrhagic debris or residual contrast material from prior CT scan 11/08/2017. 4. Aortic atherosclerosis. 5. Additional findings, as above. Aortic Atherosclerosis (ICD10-I70.0). These results were called by telephone at the time of interpretation on 11/14/2017 at 3:35 pm to Dr.  Kae Heller, who verbally acknowledged these results. Electronically Signed   By: Vinnie Langton M.D.   On: 11/14/2017 15:48   Dg Chest 2 View  Result Date: 11/08/2017 CLINICAL DATA:  Pleural effusion. EXAM: CHEST  2 VIEW COMPARISON:  Radiographs of March 25, 2012. FINDINGS: Stable cardiomediastinal silhouette. No pneumothorax is noted. Right lung is clear. Mild left basilar atelectasis or infiltrate is noted. Bony thorax is unremarkable. IMPRESSION: Mild left basilar subsegmental atelectasis or infiltrate is noted. Followup PA and lateral chest X-ray is recommended in 3-4 weeks following trial of antibiotic therapy to ensure resolution and exclude underlying malignancy. Electronically Signed   By: Marijo Conception, M.D.   On: 11/08/2017 16:46   Ct Head Wo Contrast  Result Date: 11/15/2017 CLINICAL DATA:  82 year old male post abdominal surgery yesterday. Altered level of consciousness. Not waking up. Insert sub EXAM: CT HEAD WITHOUT CONTRAST TECHNIQUE: Contiguous axial images were obtained from the base of the skull through the vertex without intravenous contrast. COMPARISON:  11/14/2017 CT. FINDINGS: Brain: Right convexity 3.9 mm subdural hematoma without change. No significant associated mass effect. No new intracranial hemorrhage seen separate from the above described finding. No CT evidence of large acute infarct or diffuse anoxia. Atrophy. No intracranial mass lesion noted on this unenhanced exam. Vascular: Vascular calcifications Skull: No acute abnormality Sinuses/Orbits:  No acute orbital abnormality. Minimal mucosal thickening ethmoid sinus air cells. Other: Mastoid air cells and middle ear cavities are clear. IMPRESSION: Right convexity 3.9 mm subdural hematoma without change. No significant associated mass effect. No new intracranial hemorrhage. No CT evidence of large acute infarct or diffuse anoxia. Atrophy. Electronically Signed   By: Genia Del M.D.   On: 11/15/2017 14:04   Ct Head Wo  Contrast  Result Date: 11/14/2017 CLINICAL DATA:  82 y/o  M; altered mental status, rule out stroke. EXAM: CT HEAD WITHOUT CONTRAST TECHNIQUE: Contiguous axial images were obtained from the base of the skull through the vertex without intravenous contrast. COMPARISON:  None. FINDINGS: Brain: No evidence of acute infarction, hemorrhage, hydrocephalus or mass lesion/mass effect. Mild chronic microvascular ischemic changes and parenchymal volume loss of the brain for age. 4 mm trace subdural collection in the right cerebral convexity with intermittent attenuation, probably subdural hematoma (series 10, image 39). No significant mass effect. Vascular: Calcific atherosclerosis of carotid siphons. No hyperdense vessel. Skull: Normal. Negative for fracture or focal lesion. Sinuses/Orbits: Mild ethmoid sinus mucosal thickening. Otherwise negative. Other: None. IMPRESSION: 1. Thin subdural collection over right cerebral convexity, likely subdural hematoma. No significant mass effect. 2. No evidence for stroke, brain parenchymal hemorrhage, or mass effect. 3. Mild chronic microvascular ischemic changes and parenchymal volume loss of the brain. Critical Value/emergent results were called by telephone at the time of interpretation on 11/14/2017 at 3:34 pm to Dr. Kae Heller, who verbally acknowledged these results. Electronically Signed   By: Kristine Garbe M.D.   On: 11/14/2017 15:31   Ct Chest W Contrast  Result Date: 11/10/2017 CLINICAL DATA:  Pleural effusions. EXAM: CT CHEST WITH CONTRAST TECHNIQUE: Multidetector CT imaging of the chest was performed during intravenous contrast administration. CONTRAST:  13mL ISOVUE-300 IOPAMIDOL (ISOVUE-300) INJECTION 61% COMPARISON:  Two-view chest x-ray 11/08/2017 FINDINGS: Cardiovascular: The heart is enlarged. Coronary artery calcifications are present. Pulmonary artery is enlarged. The main pulmonary outflow tract measures 3.9 cm. The aorta is normal in size.  Mediastinum/Nodes: Hyperdense material is present within the left hilum. This is greater than expected for calcification appears be some sort of embolic material. Lungs/Pleura: A loculated left lower lobe fusion is present. There is a large portion posteriorly inferiorly. A second loculated portion along the left major fissure. Right-sided pleural fluid is less certain to be loculated. There is significant volume loss in the left greater than right lower lobes. The upper lung fields are clear. Upper Abdomen: Unremarkable. 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 Vertebral body heights are otherwise normal. Rightward curvature of the upper thoracic spine is associated with the butterfly vertebral body. No focal lytic or blastic lesions are present. IMPRESSION: 1. Loculated bilateral pleural effusions, left greater than right. 2. Associated airspace disease likely reflects atelectasis. 3. Additional pleural irregularity on the left may represent infection associated with the fusion. 4. 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 Dextroconvex curvature is associated. Electronically Signed   By: San Morelle M.D.   On: 11/10/2017 15:19   Mr Brain Wo Contrast  Result Date: 11/16/2017 CLINICAL DATA:  82 y/o M; asymmetry limb movement abnormality of uncertain etiology. History of subdural hematoma. EXAM: MRI HEAD WITHOUT CONTRAST TECHNIQUE: Multiplanar, multiecho pulse sequences of the brain and surrounding structures were obtained without intravenous contrast. COMPARISON:  11/15/2017 CT head. FINDINGS: Brain: No reduced diffusion to suggest acute or early subacute infarction. Intermediate T1  and high T2 signal acute 3-4 mm subdural hematoma over right cerebral convexity is stable. No significant mass effect. No new intracranial hemorrhage. Mild chronic microvascular ischemic changes of white matter and parenchymal  volume loss of the brain. 4 mm right lateral ventricle nodule along the under belly of corpus callosum with increased T2 and intermediate T1 signal (series 11, image 17 and series 10, image 17). Vascular: Normal flow voids. Skull and upper cervical spine: Normal marrow signal. Sinuses/Orbits: Negative. Other: None. IMPRESSION: 1. No new acute intracranial abnormality. 2. Stable 3-4 mm right cerebral convexity subdural hematoma. 3. Mild chronic microvascular ischemic changes and mild parenchymal volume loss of the brain. 4. 4 mm right lateral ventricle nodule on under belly of corpus callosum, likely benign. Comparison with prior MRI imaging where available is recommended. Otherwise if clinically indicated consider 6-12 month follow-up to evaluate for stability. Electronically Signed   By: Kristine Garbe M.D.   On: 11/16/2017 14:53   Ct Abdomen Pelvis W Contrast  Result Date: 11/08/2017 CLINICAL DATA:  82 year old male. Left lower quadrant abscess secondary to diverticular disease in September with percutaneous drain placement. Subsequent feculent drainage, with fluoro guided injection demonstrating fistula to the colon. Drain occlusion on 10/28/2017. Up size an exchange for a new 14 [place] drain at that time. 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 Recent decreased drainage volume from catheter. Loss of appetite for 2 days with vomiting. EXAM: CT ABDOMEN AND PELVIS WITH CONTRAST TECHNIQUE: Multidetector CT imaging of the abdomen and pelvis was performed using the standard protocol following bolus administration of intravenous contrast. CONTRAST:  129mL ISOVUE-300 IOPAMIDOL (ISOVUE-300) INJECTION 61% COMPARISON:  Drain replacement images [id number]. CT Abdomen and Pelvis 09/30/2017. FINDINGS: Lower chest: New loculated appearing small to moderate size left pleural effusion with simple fluid density (series 3, image 6). Associated left lung base compressive atelectasis. No definite lower lobe or  lingula consolidation. No pericardial effusion.  Stable and negative right lung base. Hepatobiliary: Stable and negative, scattered punctate calcified granulomas. Pancreas: Negative. Spleen: Negative. Adrenals/Urinary Tract: Normal adrenal glands. Bilateral renal enhancement and contrast excretion is symmetric and within normal limits. No hydronephrosis or hydroureter. 5 cm posterior right urinary bladder diverticulum appears unchanged. Stable and negative urinary bladder otherwise. Stomach/Bowel: Stable and negative rectum. Redundant sigmoid colon with persistent circumferential wall thickening in the proximal sigmoid just distal to the left abdominal percutaneous pigtail drain site (series 3, image 56). Adjacent left gutter/mesenteric stranding or trace fluid has not resolved since October and may be mildly increased (series 3, image 58). No extraluminal gas identified. The distal sigmoid appears stable and negative. The descending colon demonstrates increased distention with low-density stool to the splenic flexure. The lumen of the descending colon becomes narrow at the level of the pigtail drain and sigmoid wall thickening. See coronal images 57 52 through 58. Distal descending colon wall thickening. Redundant transverse colon. No retained stool at the splenic flexure. Small volume of fluid in the right colon. Respiratory motion artifact in the right lower quadrant. The cecum is on a lax mesentery. The appendix is not identified today. Nondilated distal small bowel. No dilated small bowel loops. Decompressed stomach and duodenum. No other abdominal free fluid. Vascular/Lymphatic: Mild for age atherosclerosis. Major arterial structures are patent. Portal venous system appears patent. Stable lymph nodes. No lymphadenopathy. Reproductive: Negative. Other: No pelvic free fluid. Musculoskeletal: Stable.  No acute osseous abnormality identified. IMPRESSION: 1. The position of the left abdominal percutaneous pigtail  catheter appears stable since October.  No residual abscess but continued and perhaps progressive circumferential colonic wall thickening and mesenteric inflammation near the catheter. 2. Increased low-density stool distending the upstream left colon and splenic flexure raising the possibility of partial obstruction. 3. Consider Colonoscopy: The persistent colonic wall thickening in #1 may be inflammatory but colonic tumor is difficult to exclude. 4. New since October small to moderate size loculated left pleural effusion. Associated lung base atelectasis without definite pneumonia. Electronically Signed   By: Genevie Ann M.D.   On: 11/08/2017 15:30   Ir Catheter Tube Change  Result Date: 10/28/2017 INDICATION: 82 year old male with a history of left lower quadrant abscess secondary to diverticular disease. Drain placed 09/16/2017. The patient has had feculent drainage, with fluoro guided injection demonstrating fistula to the colon. Current drain has become occluded. EXAM: IR CATHETER TUBE CHANGE MEDICATIONS: None ANESTHESIA/SEDATION: None COMPLICATIONS: None PROCEDURE: Informed written consent was obtained from the patient after a thorough discussion of the procedural risks, benefits and alternatives. All questions were addressed. Maximal Sterile Barrier Technique was utilized including caps, mask, sterile gowns, sterile gloves, sterile drape, hand hygiene and skin antiseptic. A timeout was performed prior to the initiation of the procedure. Patient positioned supine position on the fluoroscopy table. The left lower quadrant and drain were prepped and draped in the usual sterile fashion. 1% lidocaine was used for local anesthesia. Attempted drain flush proved blockage of the tube. Small amount contrast entered the cavity, confirming persisting fistula. Modified Seldinger technique was used to place a new 14 [place] drain. Drain was sutured in position. Drain attached to gravity drainage. Patient tolerated the  procedure well and remained hemodynamically stable throughout. No complications were encountered and no significant blood loss. IMPRESSION: Status post upsized and exchange for a new 14 French drain into a abscess cavity of the left lower quadrant. Injection confirms persisting fistula to the colon. Signed, Dulcy Fanny. Earleen Newport, DO [facility] Electronically Signed   By: Corrie Mckusick D.O.   On: 10/28/2017 12:17   US Renal  Result Date: 11/15/2017 CLINICAL DATA:  Acute renal injury, postop exploratory laparotomy. EXAM: RENAL / URINARY TRACT ULTRASOUND COMPLETE COMPARISON:  CT 11/14/2017. FINDINGS: Right Kidney: Length: 12.6 cm. Echogenicity within normal limits. There is an 8 mm calculus in the lower pole. No mass or hydronephrosis visualized. Left Kidney: Length: 11.8 cm. Echogenicity within normal limits. Nonobstructing 8 mm calculus in the left upper pole. No mass or hydronephrosis visualized. Bladder: Decompressed by Foley catheter. Other: Small amount of perihepatic fluid is seen. IMPRESSION: 1. Bilateral nephrolithiasis without obstructive uropathy. Calculi measure on the order of 8 mm each, one seen in the lower the right kidney and the second in the upper pole of left kidney. 2. No loss of cortical-medullary distinction of either kidney. 3. Foley decompressed urinary bladder. 4. Small amount of perihepatic free fluid. Electronically Signed   By: Ashley Royalty M.D.   On: 11/15/2017 00:20   Dg Chest Port 1 View  Result Date: 11/20/2017 CLINICAL DATA:  Evaluation after placement of left basilar chest tube to water seal. Original placement of pigtail chest tube on 11/12/2017 to treat loculated basilar pleural effusion. EXAM: PORTABLE CHEST 1 VIEW COMPARISON:  11/17/2017 FINDINGS: Interval extubation and removal of nasogastric tube. Left basilar chest tube shows stable course and positioning. No significant residual pleural effusion. Mild bibasilar  atelectasis. No pneumothorax. Stable heart size. IMPRESSION: No pneumothorax. No significant residual left-sided pleural effusion. Electronically Signed   By: Jenness Corner.D.  On: 11/20/2017 15:02   Dg Chest Port 1 View  Result Date: 11/17/2017 CLINICAL DATA:  Intubated EXAM: PORTABLE CHEST 1 VIEW COMPARISON:  11/14/2017 FINDINGS: Support devices are stable. Left basilar chest tube remains in place. No pneumothorax. Heart is borderline in size. Left base atelectasis, improving since prior study. IMPRESSION: Stable support devices.  No pneumothorax. Left base atelectasis, improving since prior study. Electronically Signed   By: Rolm Baptise M.D.   On: 11/17/2017 09:01   Portable Chest Xray  Result Date: 11/14/2017 CLINICAL DATA:  Post op bowel resections EXAM: PORTABLE CHEST 1 VIEW COMPARISON:  11/14/2017 FINDINGS: Endotracheal tube tip is about 8.2 cm superior to carina. Esophageal tube tip is below the diaphragm. Left lower chest drainage catheter as before. Small pleural effusions. Mild cardiomegaly. No pneumothorax. Mild atelectasis or focus of infiltrate in the left mid lung. Left lung base consolidation. Left-sided central venous catheter tip overlies the brachiocephalic confluence. No pneumothorax. IMPRESSION: 1. Endotracheal tube tip about 8.2 cm superior to carina 2. Small left effusion with left lower lobe atelectasis or infiltrate, slight worsening. 3. Mild cardiomegaly Electronically Signed   By: Donavan Foil M.D.   On: 11/14/2017 23:03   Dg Chest Port 1 View  Result Date: 11/14/2017 CLINICAL DATA:  82 year old male with altered mental status. EXAM: PORTABLE CHEST 1 VIEW COMPARISON:  11/14/2017. FINDINGS: Cardiomediastinal silhouette enlarged but stable. There is mild pulmonary vascular congestion without frank edema. No focal parenchymal consolidation. Likely small left pleural effusion and associated atelectasis, superimposed consolidation not excluded. No pneumothorax identified.  Pigtail catheter overlies the left upper quadrant. No acute osseous abnormalities. IMPRESSION: Grossly stable appearance of the chest with small left basilar effusion/atelectasis, superimposed consolidation not excluded. Electronically Signed   By: Kristopher Oppenheim M.D.   On: 11/14/2017 13:39   Dg Chest Port 1 View  Result Date: 11/14/2017 CLINICAL DATA:  Left pleural effusion. EXAM: PORTABLE CHEST 1 VIEW COMPARISON:  Radiographs of November 08, 2017. FINDINGS: Stable cardiomediastinal silhouette. No pneumothorax is noted. Right lung is clear. Mild left basilar atelectasis or infiltrate is noted with possible minimal left pleural effusion. Bony thorax is unremarkable. IMPRESSION: Mild left basilar atelectasis or infiltrate is noted with minimal left pleural effusion. Electronically Signed   By: Marijo Conception, M.D.   On: 11/14/2017 07:30   Dg Swallowing Func-speech Pathology  Result Date: 11/19/2017 Objective Swallowing Evaluation: Type of Study: Bedside Swallow Evaluation  Patient Details Name: Obediah Welles MRN: [id number] Date of Birth: 14-Sep-1935 Today's Date: 11/19/2017 Time: SLP Start Time (ACUTE ONLY): 1158 -SLP Stop Time (ACUTE ONLY): 8250 SLP Time Calculation (min) (ACUTE ONLY): 16 min Past Medical History: Past Medical History: Diagnosis Date . Anemia  . Bladder diverticulum 11/10/2017 . Esophageal ulcer   hx/notes 09/15/2017 . Gastric ulcer   hx/notes 09/15/2017 . GI bleed  . History of blood transfusion 07/2017 . NSAID induced gastritis  Past Surgical History: Past Surgical History: Procedure Laterality Date . COLONOSCOPY  ~ 05/2016 . ESOPHAGOGASTRODUODENOSCOPY (EGD) WITH PROPOFOL N/A 07/25/2017  Procedure: ESOPHAGOGASTRODUODENOSCOPY (EGD) WITH PROPOFOL;  Surgeon: Mauri Pole, MD;  Location: [facility];  Service: Endoscopy;  Laterality: N/A; . IR CATHETER TUBE CHANGE  10/28/2017 . IR RADIOLOGIST EVAL & MGMT  09/30/2017 . IR RADIOLOGIST EVAL & MGMT  10/19/2017 . LAPAROTOMY N/A 11/14/2017   Procedure: EXPLORATORY LAPAROTOMY WITH HARTMANN PROCEDURE;  Surgeon: Clovis Riley, MD;  Location: [facility];  Service: General;  Laterality: N/A; HPI: Pt is an 82 year old male with a known peptic ulcer disease,  history of GI bleed, diverticulosis who presents with draining around indwelling catheter secondary to diverticulitis with abscess who was brought to the ED with increasing weight loss, GI spasms, cough plus leaking around his diverticular catheter site. Pt now s/p ex lap with partial sigmoid colectomy on 11/25. Of note, pt was intubated from 11/25-11/28.  Subjective: pt alert but not a great historian, provides inconsistent descriptions Assessment / Plan / Recommendation CHL IP CLINICAL IMPRESSIONS 11/19/2017 Clinical Impression -- SLP Visit Diagnosis -- Attention and concentration deficit following -- Frontal lobe and executive function deficit following -- Impact on safety and function (No Data)   CHL IP TREATMENT RECOMMENDATION 11/19/2017 Treatment Recommendations Therapy as outlined in treatment plan below   Prognosis 11/19/2017 Prognosis for Safe Diet Advancement Good Barriers to Reach Goals Cognitive deficits Barriers/Prognosis Comment -- CHL IP DIET RECOMMENDATION 11/19/2017 SLP Diet Recommendations Dysphagia 2 (Fine chop) solids;Thin liquid Liquid Administration via Cup;No straw Medication Administration Whole meds with puree Compensations Small sips/bites;Minimize environmental distractions;Slow rate;Clear throat intermittently Postural Changes Seated upright at 90 degrees   CHL IP OTHER RECOMMENDATIONS 11/19/2017 Recommended Consults -- Oral Care Recommendations Oral care BID Other Recommendations --   CHL IP FOLLOW UP RECOMMENDATIONS 11/19/2017 Follow up Recommendations (No Data)   CHL IP FREQUENCY AND DURATION 11/19/2017 Speech Therapy Frequency (ACUTE ONLY) min 2x/week Treatment Duration 2 weeks      CHL IP ORAL PHASE 11/19/2017 Oral Phase Impaired Oral - Pudding Teaspoon -- Oral - Pudding Cup  -- Oral - Honey Teaspoon -- Oral - Honey Cup -- Oral - Nectar Teaspoon -- Oral - Nectar Cup -- Oral - Nectar Straw -- Oral - Thin Teaspoon -- Oral - Thin Cup Delayed oral transit;Premature spillage Oral - Thin Straw Delayed oral transit Oral - Puree Delayed oral transit Oral - Mech Soft -- Oral - Regular Delayed oral transit Oral - Multi-Consistency -- Oral - Pill -- Oral Phase - Comment --  CHL IP PHARYNGEAL PHASE 11/19/2017 Pharyngeal Phase Impaired Pharyngeal- Pudding Teaspoon -- Pharyngeal -- Pharyngeal- Pudding Cup -- Pharyngeal -- Pharyngeal- Honey Teaspoon -- Pharyngeal -- Pharyngeal- Honey Cup -- Pharyngeal -- Pharyngeal- Nectar Teaspoon -- Pharyngeal -- Pharyngeal- Nectar Cup -- Pharyngeal -- Pharyngeal- Nectar Straw -- Pharyngeal -- Pharyngeal- Thin Teaspoon -- Pharyngeal -- Pharyngeal- Thin Cup Penetration/Aspiration during swallow;Reduced airway/laryngeal closure;Penetration/Aspiration before swallow;Pharyngeal residue - pyriform Pharyngeal Material enters airway, remains ABOVE vocal cords and not ejected out Pharyngeal- Thin Straw Penetration/Aspiration during swallow;Reduced airway/laryngeal closure Pharyngeal Material enters airway, remains ABOVE vocal cords and not ejected out Pharyngeal- Puree WFL Pharyngeal -- Pharyngeal- Mechanical Soft -- Pharyngeal -- Pharyngeal- Regular WFL Pharyngeal -- Pharyngeal- Multi-consistency -- Pharyngeal -- Pharyngeal- Pill -- Pharyngeal -- Pharyngeal Comment --  CHL IP CERVICAL ESOPHAGEAL PHASE 11/19/2017 Cervical Esophageal Phase (No Data) Pudding Teaspoon -- Pudding Cup -- Honey Teaspoon -- Honey Cup -- Nectar Teaspoon -- Nectar Cup -- Nectar Straw -- Thin Teaspoon -- Thin Cup -- Thin Straw -- Puree -- Mechanical Soft -- Regular -- Multi-consistency -- Pill -- Cervical Esophageal Comment -- No flowsheet data found. Houston Siren 11/19/2017, 1:24 PM              Ct Perc Pleural Drain W/[place]well Cath W/img Guide  Result Date: 11/12/2017 INDICATION:  82 year old with a loculated left pleural effusion. Plan for CT-guided chest tube placement. EXAM: CT-GUIDED PLACEMENT OF LEFT CHEST TUBE MEDICATIONS: None ANESTHESIA/SEDATION: Fentanyl 50 mcg IV; Versed 1.0 mg IV Moderate Sedation Time:  24 minutes The patient was continuously monitored during the procedure  by the interventional radiology nurse under my direct supervision. COMPLICATIONS: None immediate. PROCEDURE: Informed written consent was obtained from the patient after a thorough discussion of the procedural risks, benefits and alternatives. All questions were addressed. A timeout was performed prior to the initiation of the procedure. Patient was placed prone. CT images through the chest were obtained. Posterior loculated left pleural effusion was targeted. The left side of the back was prepped and draped in sterile fashion. Skin and soft tissues were anesthetized with 1% lidocaine. 18 gauge trocar needle was directed into the pleural space with CT guidance. Clear yellow fluid was aspirated. Stiff Amplatz wire was placed. Tract was dilated to accommodate a 12 [place] multipurpose drain. Drain was advanced over the wire. Clear yellow fluid was aspirated and sent for culture. Catheter was sutured to the skin and attached to a pleural fluid evacuation device. FINDINGS: Loculated effusion along the posterior left lower chest. IMPRESSION: CT-guided placement of a chest tube within the loculated left pleural effusion. Electronically Signed   By: Markus Daft M.D.   On: 11/12/2017 15:10     Nuala Alpha, DO 11/23/2017, 6:22 AM PGY-1, [place] Intern pager: [phone], text pages welcome

## 2017-11-23 NOTE — Progress Notes (Signed)
Physical Therapy Treatment Patient Details Name: Roberto Owen MRN: [id number] DOB: 04-05-1935 Today's Date: 11/23/2017    History of Present Illness Pt is an 82 year old male with a known peptic ulcer disease, history of GI bleed, diverticulosis who presents with draining around indwelling catheter secondary to diverticulitis with abscess who was brought to the ED with increasing weight loss, GI spasms, cough plus leaking around his diverticular catheter site. Pt now s/p ex lap with partial sigmoid colectomy on 11/25. Of note, pt was intubated from 11/25-11/28.    PT Comments    Pt lethargic during today's session.Pt reported feeling alot weaker today due to not exercising the past couple days. Min Assist for trunk elevation and LE management during supine to sit with HOB elevated. Increased time and effort. Once EOB assist and vc to scoot hips forward. Min guard for transfers. Once standing pt asked to sit down and rest. Pt able to ambulate out into hallway with mod encouragement. Pt fatigue quickly throughout today's session. Current plan remains appropriate due to pt decrease in endurance and assist needed with minimal activity.  Follow Up Recommendations  SNF;Supervision/Assistance - 24 hour     Equipment Recommendations  Other (comment)(TBD at next venue)    Recommendations for Other Services       Precautions / Restrictions Precautions Precautions: Fall Precaution Comments: L UQ colostomy, abdominal wound VAC Restrictions Weight Bearing Restrictions: No    Mobility  Bed Mobility Overal bed mobility: Needs Assistance Bed Mobility: Supine to Sit     Supine to sit: Min assist;HOB elevated     General bed mobility comments: increased time and effort for trunk and LE management from supine to sit EOB. VC for hand placement and to scoot hips forward.   Transfers Overall transfer level: Needs assistance Equipment used: Rolling walker (2 wheeled) Transfers: Sit to/from  Stand Sit to Stand: Min guard         General transfer comment: VC for hand placement, erect posture and to lift head upright once standing.   Ambulation/Gait Ambulation/Gait assistance: Min assist Ambulation Distance (Feet): 12 Feet Assistive device: Rolling walker (2 wheeled) Gait Pattern/deviations: Shuffle;Decreased stride length;Step-to pattern;Step-through pattern;Trunk flexed Gait velocity: decreased   General Gait Details: Pt able to ambulate out of room today. Reported increased fatigue once standing and asked to sit and rest. Pt required motivation throughout ambulation.  VC for erect posture and increased step length.   Stairs            Wheelchair Mobility    Modified Rankin (Stroke Patients Only)       Balance Overall balance assessment: Needs assistance Sitting-balance support: Feet supported;Single extremity supported Sitting balance-Leahy Scale: Fair Sitting balance - Comments: Pt able to sit EOB. VC for erect posture and mod cueing to lift head.    Standing balance support: Bilateral upper extremity supported Standing balance-Leahy Scale: Poor Standing balance comment: pt reliant on BUE support from RW when standing.                             Cognition Arousal/Alertness: Lethargic Behavior During Therapy: WFL for tasks assessed/performed Overall Cognitive Status: Within Functional Limits for tasks assessed                                        Exercises General Exercises - Lower Extremity Ankle Circles/Pumps: AROM;20 reps;Supine;Both  Long Arc Quad: AROM;Seated;10 reps Straight Leg Raises: (attempted but too weak to perform.) Hip Flexion/Marching: AROM;10 reps;Both;Seated Toe Raises: Both;AROM;Seated;20 reps Heel Raises: AROM;20 reps;Both;Seated Other Exercises Other Exercises: towel squeeze 10 reps 5 sec holds    General Comments        Pertinent Vitals/Pain Pain Assessment: 0-10 Pain Score: 5  Pain  Location: abdomen Pain Descriptors / Indicators: Sore;Discomfort;Aching Pain Intervention(s): Monitored during session;Repositioned    Home Living                      Prior Function            PT Goals (current goals can now be found in the care plan section) Acute Rehab PT Goals Patient Stated Goal: not discussed Progress towards PT goals: Progressing toward goals    Frequency    Min 3X/week      PT Plan Current plan remains appropriate    Co-evaluation              AM-PAC PT "6 Clicks" Daily Activity  Outcome Measure  Difficulty turning over in bed (including adjusting bedclothes, sheets and blankets)?: Unable Difficulty moving from lying on back to sitting on the side of the bed? : Unable Difficulty sitting down on and standing up from a chair with arms (e.g., wheelchair, bedside commode, etc,.)?: Unable Help needed moving to and from a bed to chair (including a wheelchair)?: A Lot Help needed walking in hospital room?: A Lot Help needed climbing 3-5 steps with a railing? : Total 6 Click Score: 8    End of Session Equipment Utilized During Treatment: Gait belt Activity Tolerance: Patient limited by fatigue;Patient tolerated treatment well Patient left: in chair;with chair alarm set;Other (comment);with call bell/phone within reach(with nurse tech in room) Nurse Communication: Mobility status PT Visit Diagnosis: Unsteadiness on feet (R26.81);Muscle weakness (generalized) (M62.81)     Time: 1638-4665 PT Time Calculation (min) (ACUTE ONLY): 29 min  Charges:  $Gait Training: 8-22 mins $Therapeutic Activity: 8-22 mins                    G Codes:  Functional Assessment Tool Used: AM-PAC 6 Clicks Basic Mobility    Fransisca Connors, SPTA    Fransisca Connors 11/23/2017, 1:19 PM

## 2017-11-23 NOTE — Progress Notes (Signed)
Placed stat order for BMP at 1757  Second page to MD  Awaiting call back

## 2017-11-23 NOTE — NC FL2 (Signed)
[place] LEVEL OF CARE SCREENING TOOL     IDENTIFICATION  Patient Name: Roberto Owen Birthdate: Aug 10, 1935 Sex: male Admission Date (Current Location): 11/08/2017  [facility] [place] and [place] Number:  Herbalist and Address:  The [place]. [facility], [place] [address] [place], [place] [address]      Provider Number: [id number]  Attending Physician Name and Address:  Lind Covert, MD  Relative Name and Phone Number:  Zellous,[name] Daughter   [phone] [place],Dr. Christia Reading   [phone]     Current Level of Care: Hospital Recommended Level of Care: [place] Prior Approval Number:    Date Approved/Denied:   PASRR Number: [id number]  Discharge Plan: SNF    Current Diagnoses: Patient Active Problem List   Diagnosis Date Noted  . Severe protein-calorie malnutrition ([place]) 11/11/2017  . Dehydration   . Hypoalbuminemia due to protein-calorie malnutrition ([place]) 11/10/2017  . Bladder diverticulum 11/10/2017  . Colonic obstruction ([place]) 11/10/2017  . Thrombocytosis ([place]) 11/10/2017  . Euthyroid sick syndrome 11/10/2017  . Hematuria, microscopic 11/10/2017  . Hiccups 11/10/2017  . Atrial fibrillation ([place])   . Loculated pleural effusion on Left, Possible   . Diverticulitis 11/08/2017  . Colonic fistula, History of, by fistulogram 09/30/2017  . Hyponatremia 09/16/2017  . Diverticulitis of large intestine with abscess 09/16/2017  . PUD (peptic ulcer disease) 09/16/2017  . AKI (acute kidney injury) ([place]) 09/16/2017  . Diverticulitis of intestine with abscess 09/16/2017  . Orthostasis 09/16/2017  . Diverticular disease of intestine with perforation and abscess   . Acute gastric ulcer with hemorrhage   . Multiple gastric ulcers   . Anemia due to GI blood loss   . GI bleed 07/23/2017  . Microcytic anemia 07/23/2017    Orientation RESPIRATION BLADDER Height & Weight     Self, Time, Situation, Place  Normal  Continent Weight: 226 lb 3.1 oz (102.6 kg) Height:  '6\' 6"'  (198.1 cm)  BEHAVIORAL SYMPTOMS/MOOD NEUROLOGICAL BOWEL NUTRITION STATUS  (None)  None Continent, Colostomy Diet(Dysphagia 2 diet)  AMBULATORY STATUS COMMUNICATION OF NEEDS Skin   Extensive Assist Verbally Wound Vac, Surgical wounds, Other (Comment)(Catheter entry/exit. Wound vac dressing changed MWF.)                       Personal Care Assistance Level of Assistance  Bathing, Feeding, Dressing Bathing Assistance: Limited assistance Feeding assistance: Limited assistance Dressing Assistance: Limited assistance     Functional Limitations Info  Sight, Hearing, Speech Sight Info: Adequate Hearing Info: Adequate Speech Info: Adequate    SPECIAL CARE FACTORS FREQUENCY  PT (By licensed PT), Speech therapy     PT Frequency: 5 x week       Speech Therapy Frequency: 5 x week      Contractures Contractures Info: Not present    Additional Factors Info  Isolation Precautions Code Status Info: Full Code Allergies Info: NKA     Isolation Precautions Info: Contact: MRSA     Current Medications (11/23/2017):  This is the current hospital active medication list Current Facility-Administered Medications  Medication Dose Route Frequency Provider Last Rate Last Dose  . acetaminophen (TYLENOL) solution 650 mg  650 mg Oral Q6H Rogue Bussing, MD   650 mg at 11/21/17 [phone]  . amiodarone (PACERONE) tablet 200 mg  200 mg Oral Daily Rogue Bussing, MD   200 mg at 11/23/17 0801  . apixaban (ELIQUIS) tablet 2.5 mg  2.5 mg Oral BID Caroline More, DO  2.5 mg at 11/23/17 0801  . chlorhexidine gluconate (MEDLINE KIT) (PERIDEX) 0.12 % solution 15 mL  15 mL Mouth Rinse BID Rigoberto Noel, MD   15 mL at 11/20/17 2200  . feeding supplement (ENSURE ENLIVE) (ENSURE ENLIVE) liquid 237 mL  237 mL Oral TID BM Lind Covert, MD   237 mL at 11/23/17 0842  . MEDLINE mouth rinse  15 mL Mouth Rinse QID Rigoberto Noel,  MD   15 mL at 11/21/17 [phone]  . metoprolol tartrate (LOPRESSOR) tablet 12.5 mg  12.5 mg Oral BID Rogue Bussing, MD   12.5 mg at 11/23/17 0801  . multivitamin with minerals tablet 1 tablet  1 tablet Oral Daily Lind Covert, MD   1 tablet at 11/22/17 1251  . pantoprazole (PROTONIX) EC tablet 40 mg  40 mg Oral BID Rogue Bussing, MD   40 mg at 11/23/17 0801  . ramelteon (ROZEREM) tablet 8 mg  8 mg Oral QHS Lockamy, Timothy, DO   8 mg at 11/22/17 2300  . traMADol (ULTRAM) tablet 50 mg  50 mg Oral Q8H PRN Riccio, Angela C, DO   50 mg at 11/23/17 0800  . white petrolatum (VASELINE) gel   Topical PRN Erick Colace, NP         Discharge Medications: Please see discharge summary for a list of discharge medications.  Relevant Imaging Results:  Relevant Lab Results:   Additional Information SS#: [id number]  Candie Chroman, LCSW

## 2017-11-23 NOTE — Progress Notes (Signed)
9 Days Post-Op   Subjective/Chief Complaint: No changes sore at laparotomy site    Objective: Vital signs in last 24 hours: Temp:  [98 F (36.7 C)-98.9 F (37.2 C)] 98.9 F (37.2 C) (12/04 0506) Pulse Rate:  [74-75] 75 (12/04 0506) Resp:  [16-18] 18 (12/04 0506) BP: (110-152)/(49-62) 123/62 (12/04 0506) SpO2:  [100 %] 100 % (12/04 0506) Weight:  [102.6 kg (226 lb 3.1 oz)] 102.6 kg (226 lb 3.1 oz) (12/04 0506) Last BM Date: 11/22/17  Intake/Output from previous day: 12/03 0701 - 12/04 0700 In: 740 [P.O.:740] Out: 1600 [Urine:925; Stool:675] Intake/Output this shift: Total I/O In: -  Out: 150 [Urine:150]  Incision/Wound:vac in place ostomy viable and functioning   Lab Results:  Recent Labs    11/22/17 0556  WBC 9.6  HGB 8.5*  HCT 26.6*  PLT 355   BMET Recent Labs    11/22/17 0556 11/23/17 0510  NA 140 139  K 3.9 4.3  CL 117* 111  CO2 19* 20*  GLUCOSE 93 82  BUN 20 20  CREATININE 2.34* 2.46*  CALCIUM 7.7* 7.9*   PT/INR No results for input(s): LABPROT, INR in the last 72 hours. ABG No results for input(s): PHART, HCO3 in the last 72 hours.  Invalid input(s): PCO2, PO2  Studies/Results: No results found.  Anti-infectives: Anti-infectives (From admission, onward)   Start     Dose/Rate Route Frequency Ordered Stop   11/18/17 1800  vancomycin (VANCOCIN) IVPB 1000 mg/200 mL premix     1,000 mg 200 mL/hr over 60 Minutes Intravenous Every 48 hours 11/17/17 1419 11/22/17 2252   11/15/17 0515  vancomycin (VANCOCIN) 1,250 mg in sodium chloride 0.9 % 250 mL IVPB  Status:  Discontinued     1,250 mg 166.7 mL/hr over 90 Minutes Intravenous Every 24 hours 11/15/17 0502 11/15/17 1746   11/14/17 2315  vancomycin (VANCOCIN) IVPB 750 mg/150 ml premix  Status:  Discontinued     750 mg 150 mL/hr over 60 Minutes Intravenous Every 12 hours 11/14/17 2306 11/14/17 2309   11/13/17 1600  vancomycin (VANCOCIN) IVPB 750 mg/150 ml premix  Status:  Discontinued     750  mg 150 mL/hr over 60 Minutes Intravenous Every 12 hours 11/13/17 1057 11/14/17 2306   11/11/17 1600  vancomycin (VANCOCIN) IVPB 1000 mg/200 mL premix  Status:  Discontinued     1,000 mg 200 mL/hr over 60 Minutes Intravenous Every 12 hours 11/11/17 0443 11/13/17 1057   11/09/17 1600  vancomycin (VANCOCIN) IVPB 750 mg/150 ml premix  Status:  Discontinued     750 mg 150 mL/hr over 60 Minutes Intravenous Every 12 hours 11/09/17 1500 11/11/17 0443   11/09/17 0800  vancomycin (VANCOCIN) IVPB 750 mg/150 ml premix  Status:  Discontinued     750 mg 150 mL/hr over 60 Minutes Intravenous Every 12 hours 11/08/17 2152 11/09/17 1500   11/09/17 0200  piperacillin-tazobactam (ZOSYN) IVPB 3.375 g     3.375 g 12.5 mL/hr over 240 Minutes Intravenous Every 8 hours 11/08/17 1821 11/22/17 2359   11/08/17 2200  vancomycin (VANCOCIN) 2,000 mg in sodium chloride 0.9 % 500 mL IVPB     2,000 mg 250 mL/hr over 120 Minutes Intravenous  Once 11/08/17 2152 11/09/17 0020   11/08/17 1830  piperacillin-tazobactam (ZOSYN) IVPB 3.375 g     3.375 g 100 mL/hr over 30 Minutes Intravenous  Once 11/08/17 1821 11/08/17 2134   11/08/17 1745  piperacillin-tazobactam (ZOSYN) IVPB 3.375 g  Status:  Discontinued     3.375  g 100 mL/hr over 30 Minutes Intravenous  Once 11/08/17 1733 11/08/17 1734      Assessment/Plan: s/p Procedure(s): EXPLORATORY LAPAROTOMY WITH HARTMANN PROCEDURE (N/A) On DYS 2 diet Can be placed at anytime Last day of ABX   LOS: 15 days    Joyice Faster Lyndol Vanderheiden 11/23/2017

## 2017-11-23 NOTE — Progress Notes (Signed)
Paged MD  Pushed 1/2 amp IV 50% Dextrose  Awaiting call back  Pt AOx4 pt stated "this is not new"

## 2017-11-23 NOTE — Progress Notes (Signed)
Pt CBG low. At 25. Pt nonsymptomatic. Pt states he will have an orange juice and eat dinner. Re checked and blood sugar resulted at 43. Pt still nonsymptomatic. Pt states he will eat more, pt does not want other interventions at this time. Informed pt of IV dextrose and glucose gel. Pt states he will re try if CBG is low when rechecked. RN at bedside encouraging PO intake

## 2017-11-23 NOTE — Clinical Social Work Note (Signed)
CSW met with patient. He and his daughter have reviewed bed offers and will notify CSW of their decision today. Patient asked for CSW contact card so they can call once his daughter arrives.  Dayton Scrape, [place]

## 2017-11-23 NOTE — Progress Notes (Signed)
MD returned page  Informed MD that CBG was changed to q AM and RN requested to switch back to q4 hour due to pt poor intake and poor management  MD provided verbal order for CBG q 4 hour  Will continue to monitor

## 2017-11-23 NOTE — Progress Notes (Signed)
Pt given oral glucose gel  Will recheck CBG  Continuing to monitor pt  Pt nonsymptomatic

## 2017-11-23 NOTE — Discharge Instructions (Signed)

## 2017-11-24 LAB — GLUCOSE, CAPILLARY
GLUCOSE-CAPILLARY: 74 mg/dL (ref 65–99)
GLUCOSE-CAPILLARY: 50 mg/dL — AB (ref 65–99)
GLUCOSE-CAPILLARY: 41 mg/dL — AB (ref 65–99)
GLUCOSE-CAPILLARY: 93 mg/dL (ref 65–99)
GLUCOSE-CAPILLARY: 47 mg/dL — AB (ref 65–99)
Glucose-Capillary: 83 mg/dL (ref 65–99)
Glucose-Capillary: 87 mg/dL (ref 65–99)
Glucose-Capillary: 88 mg/dL (ref 65–99)

## 2017-11-24 MED ORDER — PRO-STAT SUGAR FREE PO LIQD
30.0000 mL | Freq: Two times a day (BID) | ORAL | Status: DC
  Administered 2017-11-24 – 2017-11-25 (×2): 30 mL via ORAL
  Filled 2017-11-24 (×5): qty 30

## 2017-11-24 MED ORDER — SODIUM CHLORIDE 0.9 % IV BOLUS (SEPSIS)
500.0000 mL | Freq: Once | INTRAVENOUS | Status: AC
  Administered 2017-11-24: 500 mL via INTRAVENOUS

## 2017-11-24 MED ORDER — DEXTROSE 50 % IV SOLN
INTRAVENOUS | Status: AC
  Administered 2017-11-24: 50 mL
  Filled 2017-11-24: qty 50

## 2017-11-24 NOTE — Progress Notes (Signed)
Family Medicine Teaching Service Daily Progress Note Intern Pager: [phone]  Patient name: Roberto Owen Medical record number: [id number] Date of birth: Nov 29, 1935 Age: 82 y.o. Gender: male  Primary Care Provider: Patient, No Pcp Per Consultants: Surgery, IR, Cardiology, Oncology  Code Status: Full   Pt Overview and Major Events to Date:  11/19 - admitted for GI spasms, LOA 11/22 - allowed to eat liquid diet per Surgery 11/23 - Left chest tube placement by IR 11/25 - perforated diverticuli requiring emergent colectomy 11/26 - found to have T4N1 colorectal carcinoma  Assessment and Plan: Roberto Owen a 82 y.o.malepresenting with an in place abdominal drain due to previous episode of diverticulitis with abscess of the sigmoid colon, developed bowel perforation s/p ex-lap and coloectomy with colostomy placed and found to have colorectal carcinoma. PMH is significant forPeptic ulcer disease, history of GI bleedand previous episode of diverticulosis.  Perforated viscus s/p ex lap and colostomy (Hartmann's Procedure): He is POD#10. - Surgery following, surgically stable for placement per note on 12/5 - Per Surgery, full course of antibiotics have been completed as of yesterday. Vanc 1g for 3 doses every 48hrs started on 11/29. Zosyn 3.375g q8 for 14 days, started on 11/20 (day 14).  - tylenol PRN - zofran PRN - dysphagia diet - patient will go to SNF per PT/OT recs - Call daughter today as she and patient have been discussing SNF placement  Colorectal Carcinoma Patient was found to have colorectal carcinoma after ex lap and colostomy. Hematology and GI oncology on board. Pathology showed T4N1 perforated adenocarcinoma. - oncology following, follow up appointment with Dr. Benay Spice on 12/10 @ 2pm  - Hematology also following  Loculated Left Pleural Effusion Chest tube removed per IR on 12/2. CXR 12/1 stable with no significant remaining pleural effusion - Vanc/Zosyn for  HCAP coverage overlaps with coverage from ex-lap and colectomy with colostomy. Abx course completed as of 12/4. - Continuous pulse ox - IR has signed off  Hypoglycemia Patient had episode on 12/4 of CBG of 29. He was given several cups of orange juice and peanut butter with graham crackers. Afterwards, stable for the past with CBGs of 74-168. Patient has had persistently low glucose readings and in part may be due to poor appetite. No sugar lowering medications on board. Unclear etiology. Cortisol 12/4 in pm was high at 15.4. -assess glucose on BMP -low glucose protocol -per nutrition Enlive to supplement caloric needs  New Onset Afib stable on oral amiodarone. - Cardiology has signed off and will follow up outpatient.  - Stopped Heparin due to small subdural hematomas found; neuro recs appreciated - continue amio 200mg  daily - continue Metoprolol 12.5 BID - restarted Eliquis after consulting with Hematology  Anemia Hemoglobin stable at 9.3 on 11/20/17, BL ~ 9.0 - Continue to monitor with CBC - per onc recs, will give IV feraheme during hospitalization and d/c w/ oral iron  Hypoalbuminemia due to Severe protein-calorie malnutrition ([place]) - Per Nutrition 70ml Prostat TID.  - dysphagia diet  AKI/CKD IV Appears to be in Stage 4 CKD per labs. Scr of 2.73, increased from 2.46 on 11/23/17. Baseline ~0.8. GFR stable. Renal U/S showed bilateral non-obstructive nephrolithasis. Calculi measure on the order of 8 mm each, one seen in the lower the right kidney and the second in the upper pole of left kidney. Suspect his continual rise in Cr is due to poor oral hydration. - cont am BMP to monitor Cr and once it stablizes d/c daily BMP - give 500cc  NS bolus today to help  Hypokalemia Stable. K 4.1 today - monitor and replete as necessary  Small Subdural Hematoma: stable CT Head on right convexity 3.9 mm subdural hematoma without change on 11/26. MRI also on 11/27 showed no acute changes,  stable 3-60mm right cerebral convexity subdural hematoma. D/w neurosurgery who felt hematomas were stable and it was okay to restart anticoagulation -Continue heparin ppx for now -Hematology recommended starting elliquis   FEN/GI:Protonix, dysphagia 2 diet Prophylaxis:Heparin  Disposition: SNF vs. CIR  Subjective:  Patient today states he feels good; he still endorses  mild pain in his left lower abdominal quadrant only when he coughs. Otherwise, he feels good and is excited for rehab. Of note, patient states he is not finishing his meals and has a poor appetite. On further questioning he states he doesn't want to complain but he doesn't really like the food here. He also endorses being unable to taste the food.  Objective: Temp:  [97.5 F (36.4 C)-98.3 F (36.8 C)] 98.3 F (36.8 C) (12/05 0528) Pulse Rate:  [62-83] 63 (12/05 0528) Resp:  [18] 18 (12/05 0528) BP: (116-128)/(46-64) 128/55 (12/05 0528) SpO2:  [100 %] 100 % (12/05 0528) Weight:  [224 lb 6.9 oz (101.8 kg)] 224 lb 6.9 oz (101.8 kg) (12/05 0528) Physical Exam: Gen: Alert and Oriented x 3, NAD HEENT: Normocephalic, atraumatic, PERRLA, EOMI,  CV: Irregularly irregular rhythm, no murmurs, normal S1, S2 split, +2 pulses dorsalis pedis bilaterally Resp: CTAB, no wheezing, rales, or rhonchi, comfortable work of breathing Abd: non-distended, non-tender, soft, +bs in all four quadrants, midline incision clean no signs of infection, colostomy bag in place in LUQ with good output MSK: FROM in all four extremities Ext: no clubbing, cyanosis, or edema Skin: warm, dry, intact, no rashes  Laboratory: Recent Labs  Lab 11/19/17 0506 11/20/17 0423 11/22/17 0556  WBC 10.1 8.8 9.6  HGB 8.7* 9.3* 8.5*  HCT 27.2* 29.2* 26.6*  PLT 328 298 355   Recent Labs  Lab 11/20/17 0423  11/22/17 0556 11/23/17 0510 11/23/17 1835  NA 144   < > 140 139 137  K 4.0   < > 3.9 4.3 4.1  CL 122*   < > 117* 111 112*  CO2 17*   < > 19* 20* 18*   BUN 22*   < > 20 20 22*  CREATININE 2.04*   < > 2.34* 2.46* 2.73*  CALCIUM 7.7*   < > 7.7* 7.9* 7.8*  PROT 5.9*  --   --   --   --   BILITOT 0.4  --   --   --   --   ALKPHOS 51  --   --   --   --   ALT 12*  --   --   --   --   AST 17  --   --   --   --   GLUCOSE 79   < > 93 82 168*   < > = values in this interval not displayed.     Imaging/Diagnostic Tests: Ct Abdomen Pelvis Wo Contrast  Result Date: 11/14/2017 CLINICAL DATA:  82 year old male presenting with signs and symptoms suspicious for potential bowel obstruction. EXAM: CT ABDOMEN AND PELVIS WITHOUT CONTRAST TECHNIQUE: Multidetector CT imaging of the abdomen and pelvis was performed following the standard protocol without IV contrast. COMPARISON:  CT the abdomen and pelvis 11/08/2017. FINDINGS: Lower chest: Pigtail drainage catheter in the lower left hemithorax posteriorly. Small left-sided pleural effusion lying dependently.  Trace right pleural effusion lying dependently. Areas of apparent subsegmental atelectasis in the lower lobes of lungs bilaterally. Hepatobiliary: Numerous tiny calcified granulomas. No other definite suspicious appearing hepatic lesions noted throughout the liver on today's noncontrast CT examination. Amorphous intermediate to high attenuation material lying dependently in the gallbladder, presumably biliary sludge. Pancreas: No definite pancreatic mass noted on today's noncontrast CT examination. Small amount of peripancreatic stranding noted, but nonspecific given generalize soft tissues trending throughout the retroperitoneum, mesenteric and diffuse body wall edema. Spleen: Unremarkable. Adrenals/Urinary Tract: Several nonobstructive calculi are noted within the collecting systems of both kidneys, measuring up to 8 mm in the lower pole collecting system of the right kidney. No ureteral stones. No hydroureteronephrosis. Bilateral adrenal glands are normal in appearance. Large right-sided bladder diverticulum with  some high attenuation material lying dependently in this diverticulum, new compared to the prior examination from 11/08/2017, potentially residual contrast material or proteinaceous/hemorrhagic debris. Stomach/Bowel: Unenhanced appearance of the stomach is unremarkable. No pathologic dilatation of small bowel. Colon appears moderately distended with multiple air-fluid levels. Previously noted diverticular abscess with indwelling pigtail drainage catheter in the distal descending colon (axial image 57 of series 3) remains completely decompressed. Soft tissue stranding adjacent to the decompressed abscess is noted, as well as adjacent locules of extraluminal gas (axial image 58 of series 3) which are new compared to the prior study. The appendix is not confidently identified and may be surgically absent. Regardless, there are no inflammatory changes noted adjacent to the cecum to suggest the presence of an acute appendicitis at this time. Vascular/Lymphatic: Aortic atherosclerosis, without evidence of aneurysm in the abdominal or pelvic vasculature. No lymphadenopathy noted in the abdomen or pelvis on today's noncontrast CT examination. Reproductive: Prostate gland and seminal vesicles are incompletely imaged. Other: New pneumoperitoneum (moderate volume) noted. Trace volume of ascites. Diffuse mesenteric and retroperitoneal edema. Musculoskeletal: Diffuse body wall edema. There are no aggressive appearing lytic or blastic lesions noted in the visualized portions of the skeleton. IMPRESSION: 1. New pneumoperitoneum compared to the prior examination, compatible with perforated viscus. The exact source of this is uncertain, but this is suspected be related to gas traversing the chronic diverticular abscess wall at the site of the indwelling catheter. This was discussed by phone with Dr. Romana Juniper. 2. Diffuse body wall edema, mesenteric edema and retroperitoneal edema with bilateral pleural effusions, suspicious for  a state of anasarca. 3. Multiple nonobstructive calculi in the collecting systems of both kidneys measuring up to 8 mm in the lower pole collecting system of the right kidney. There is also some new high attenuation material lying dependently in the large right bladder diverticulum. This could represent multiple tiny calculi, but this is unlikely given the development compared to the recent prior examination. Rather, this is favored to represent some proteinaceous/hemorrhagic debris or residual contrast material from prior CT scan 11/08/2017. 4. Aortic atherosclerosis. 5. Additional findings, as above. Aortic Atherosclerosis (ICD10-I70.0). These results were called by telephone at the time of interpretation on 11/14/2017 at 3:35 pm to Dr. Kae Heller, who verbally acknowledged these results. Electronically Signed   By: Vinnie Langton M.D.   On: 11/14/2017 15:48   Dg Chest 2 View  Result Date: 11/08/2017 CLINICAL DATA:  Pleural effusion. EXAM: CHEST  2 VIEW COMPARISON:  Radiographs of March 25, 2012. FINDINGS: Stable cardiomediastinal silhouette. No pneumothorax is noted. Right lung is clear. Mild left basilar atelectasis or infiltrate is noted. Bony thorax is unremarkable. IMPRESSION: Mild left basilar subsegmental atelectasis or infiltrate is  noted. Followup PA and lateral chest X-ray is recommended in 3-4 weeks following trial of antibiotic therapy to ensure resolution and exclude underlying malignancy. Electronically Signed   By: Marijo Conception, M.D.   On: 11/08/2017 16:46   Ct Head Wo Contrast  Result Date: 11/15/2017 CLINICAL DATA:  82 year old male post abdominal surgery yesterday. Altered level of consciousness. Not waking up. Insert sub EXAM: CT HEAD WITHOUT CONTRAST TECHNIQUE: Contiguous axial images were obtained from the base of the skull through the vertex without intravenous contrast. COMPARISON:  11/14/2017 CT. FINDINGS: Brain: Right convexity 3.9 mm subdural hematoma without change. No  significant associated mass effect. No new intracranial hemorrhage seen separate from the above described finding. No CT evidence of large acute infarct or diffuse anoxia. Atrophy. No intracranial mass lesion noted on this unenhanced exam. Vascular: Vascular calcifications Skull: No acute abnormality Sinuses/Orbits: No acute orbital abnormality. Minimal mucosal thickening ethmoid sinus air cells. Other: Mastoid air cells and middle ear cavities are clear. IMPRESSION: Right convexity 3.9 mm subdural hematoma without change. No significant associated mass effect. No new intracranial hemorrhage. No CT evidence of large acute infarct or diffuse anoxia. Atrophy. Electronically Signed   By: Genia Del M.D.   On: 11/15/2017 14:04   Ct Head Wo Contrast  Result Date: 11/14/2017 CLINICAL DATA:  82 y/o  M; altered mental status, rule out stroke. EXAM: CT HEAD WITHOUT CONTRAST TECHNIQUE: Contiguous axial images were obtained from the base of the skull through the vertex without intravenous contrast. COMPARISON:  None. FINDINGS: Brain: No evidence of acute infarction, hemorrhage, hydrocephalus or mass lesion/mass effect. Mild chronic microvascular ischemic changes and parenchymal volume loss of the brain for age. 4 mm trace subdural collection in the right cerebral convexity with intermittent attenuation, probably subdural hematoma (series 10, image 39). No significant mass effect. Vascular: Calcific atherosclerosis of carotid siphons. No hyperdense vessel. Skull: Normal. Negative for fracture or focal lesion. Sinuses/Orbits: Mild ethmoid sinus mucosal thickening. Otherwise negative. Other: None. IMPRESSION: 1. Thin subdural collection over right cerebral convexity, likely subdural hematoma. No significant mass effect. 2. No evidence for stroke, brain parenchymal hemorrhage, or mass effect. 3. Mild chronic microvascular ischemic changes and parenchymal volume loss of the brain. Critical Value/emergent results were called  by telephone at the time of interpretation on 11/14/2017 at 3:34 pm to Dr. Kae Heller, who verbally acknowledged these results. Electronically Signed   By: Kristine Garbe M.D.   On: 11/14/2017 15:31   Ct Chest W Contrast  Result Date: 11/10/2017 CLINICAL DATA:  Pleural effusions. EXAM: CT CHEST WITH CONTRAST TECHNIQUE: Multidetector CT imaging of the chest was performed during intravenous contrast administration. CONTRAST:  34mL ISOVUE-300 IOPAMIDOL (ISOVUE-300) INJECTION 61% COMPARISON:  Two-view chest x-ray 11/08/2017 FINDINGS: Cardiovascular: The heart is enlarged. Coronary artery calcifications are present. Pulmonary artery is enlarged. The main pulmonary outflow tract measures 3.9 cm. The aorta is normal in size. Mediastinum/Nodes: Hyperdense material is present within the left hilum. This is greater than expected for calcification appears be some sort of embolic material. Lungs/Pleura: A loculated left lower lobe fusion is present. There is a large portion posteriorly inferiorly. A second loculated portion along the left major fissure. Right-sided pleural fluid is less certain to be loculated. There is significant volume loss in the left greater than right lower lobes. The upper lung fields are clear. Upper Abdomen: Unremarkable. 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 Vertebral body heights are otherwise normal. Rightward  curvature of the upper thoracic spine is associated with the butterfly vertebral body. No focal lytic or blastic lesions are present. IMPRESSION: 1. Loculated bilateral pleural effusions, left greater than right. 2. Associated airspace disease likely reflects atelectasis. 3. Additional pleural irregularity on the left may represent infection associated with the fusion. 4. 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 Dextroconvex curvature is associated. Electronically Signed   By:  San Morelle M.D.   On: 11/10/2017 15:19   Mr Brain Wo Contrast  Result Date: 11/16/2017 CLINICAL DATA:  82 y/o M; asymmetry limb movement abnormality of uncertain etiology. History of subdural hematoma. EXAM: MRI HEAD WITHOUT CONTRAST TECHNIQUE: Multiplanar, multiecho pulse sequences of the brain and surrounding structures were obtained without intravenous contrast. COMPARISON:  11/15/2017 CT head. FINDINGS: Brain: No reduced diffusion to suggest acute or early subacute infarction. Intermediate T1 and high T2 signal acute 3-4 mm subdural hematoma over right cerebral convexity is stable. No significant mass effect. No new intracranial hemorrhage. Mild chronic microvascular ischemic changes of white matter and parenchymal volume loss of the brain. 4 mm right lateral ventricle nodule along the under belly of corpus callosum with increased T2 and intermediate T1 signal (series 11, image 17 and series 10, image 17). Vascular: Normal flow voids. Skull and upper cervical spine: Normal marrow signal. Sinuses/Orbits: Negative. Other: None. IMPRESSION: 1. No new acute intracranial abnormality. 2. Stable 3-4 mm right cerebral convexity subdural hematoma. 3. Mild chronic microvascular ischemic changes and mild parenchymal volume loss of the brain. 4. 4 mm right lateral ventricle nodule on under belly of corpus callosum, likely benign. Comparison with prior MRI imaging where available is recommended. Otherwise if clinically indicated consider 6-12 month follow-up to evaluate for stability. Electronically Signed   By: Kristine Garbe M.D.   On: 11/16/2017 14:53   Ct Abdomen Pelvis W Contrast  Result Date: 11/08/2017 CLINICAL DATA:  82 year old male. Left lower quadrant abscess secondary to diverticular disease in September with percutaneous drain placement. Subsequent feculent drainage, with fluoro guided injection demonstrating fistula to the colon. Drain occlusion on 10/28/2017. Up size an exchange  for a new 14 [place] drain at that time. 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 Recent decreased drainage volume from catheter. Loss of appetite for 2 days with vomiting. EXAM: CT ABDOMEN AND PELVIS WITH CONTRAST TECHNIQUE: Multidetector CT imaging of the abdomen and pelvis was performed using the standard protocol following bolus administration of intravenous contrast. CONTRAST:  138mL ISOVUE-300 IOPAMIDOL (ISOVUE-300) INJECTION 61% COMPARISON:  Drain replacement images [id number]. CT Abdomen and Pelvis 09/30/2017. FINDINGS: Lower chest: New loculated appearing small to moderate size left pleural effusion with simple fluid density (series 3, image 6). Associated left lung base compressive atelectasis. No definite lower lobe or lingula consolidation. No pericardial effusion.  Stable and negative right lung base. Hepatobiliary: Stable and negative, scattered punctate calcified granulomas. Pancreas: Negative. Spleen: Negative. Adrenals/Urinary Tract: Normal adrenal glands. Bilateral renal enhancement and contrast excretion is symmetric and within normal limits. No hydronephrosis or hydroureter. 5 cm posterior right urinary bladder diverticulum appears unchanged. Stable and negative urinary bladder otherwise. Stomach/Bowel: Stable and negative rectum. Redundant sigmoid colon with persistent circumferential wall thickening in the proximal sigmoid just distal to the left abdominal percutaneous pigtail drain site (series 3, image 56). Adjacent left gutter/mesenteric stranding or trace fluid has not resolved since October and may be mildly increased (series 3, image 58). No extraluminal gas identified. The distal sigmoid appears stable and negative. The descending colon demonstrates  increased distention with low-density stool to the splenic flexure. The lumen of the descending colon becomes narrow at the level of the pigtail drain and sigmoid wall thickening. See coronal images 57 52 through 58. Distal descending colon  wall thickening. Redundant transverse colon. No retained stool at the splenic flexure. Small volume of fluid in the right colon. Respiratory motion artifact in the right lower quadrant. The cecum is on a lax mesentery. The appendix is not identified today. Nondilated distal small bowel. No dilated small bowel loops. Decompressed stomach and duodenum. No other abdominal free fluid. Vascular/Lymphatic: Mild for age atherosclerosis. Major arterial structures are patent. Portal venous system appears patent. Stable lymph nodes. No lymphadenopathy. Reproductive: Negative. Other: No pelvic free fluid. Musculoskeletal: Stable.  No acute osseous abnormality identified. IMPRESSION: 1. The position of the left abdominal percutaneous pigtail catheter appears stable since October. No residual abscess but continued and perhaps progressive circumferential colonic wall thickening and mesenteric inflammation near the catheter. 2. Increased low-density stool distending the upstream left colon and splenic flexure raising the possibility of partial obstruction. 3. Consider Colonoscopy: The persistent colonic wall thickening in #1 may be inflammatory but colonic tumor is difficult to exclude. 4. New since October small to moderate size loculated left pleural effusion. Associated lung base atelectasis without definite pneumonia. Electronically Signed   By: Genevie Ann M.D.   On: 11/08/2017 15:30   Ir Catheter Tube Change  Result Date: 10/28/2017 INDICATION: 82 year old male with a history of left lower quadrant abscess secondary to diverticular disease. Drain placed 09/16/2017. The patient has had feculent drainage, with fluoro guided injection demonstrating fistula to the colon. Current drain has become occluded. EXAM: IR CATHETER TUBE CHANGE MEDICATIONS: None ANESTHESIA/SEDATION: None COMPLICATIONS: None PROCEDURE: Informed written consent was obtained from the patient after a thorough discussion of the procedural risks, benefits and  alternatives. All questions were addressed. Maximal Sterile Barrier Technique was utilized including caps, mask, sterile gowns, sterile gloves, sterile drape, hand hygiene and skin antiseptic. A timeout was performed prior to the initiation of the procedure. Patient positioned supine position on the fluoroscopy table. The left lower quadrant and drain were prepped and draped in the usual sterile fashion. 1% lidocaine was used for local anesthesia. Attempted drain flush proved blockage of the tube. Small amount contrast entered the cavity, confirming persisting fistula. Modified Seldinger technique was used to place a new 14 [place] drain. Drain was sutured in position. Drain attached to gravity drainage. Patient tolerated the procedure well and remained hemodynamically stable throughout. No complications were encountered and no significant blood loss. IMPRESSION: Status post upsized and exchange for a new 14 French drain into a abscess cavity of the left lower quadrant. Injection confirms persisting fistula to the colon. Signed, Dulcy Fanny. Earleen Newport, DO [id number] Electronically Signed   By: Corrie Mckusick D.O.   On: 10/28/2017 12:17   US Renal  Result Date: 11/15/2017 CLINICAL DATA:  Acute renal injury, postop exploratory laparotomy. EXAM: RENAL / URINARY TRACT ULTRASOUND COMPLETE COMPARISON:  CT 11/14/2017. FINDINGS: Right Kidney: Length: 12.6 cm. Echogenicity within normal limits. There is an 8 mm calculus in the lower pole. No mass or hydronephrosis visualized. Left Kidney: Length: 11.8 cm. Echogenicity within normal limits. Nonobstructing 8 mm calculus in the left upper pole. No mass or hydronephrosis visualized. Bladder: Decompressed by Foley catheter. Other: Small amount of perihepatic fluid is seen. IMPRESSION: 1. Bilateral nephrolithiasis without obstructive uropathy. Calculi measure on the order of 8 mm each, one  seen in the lower the right kidney  and the second in the upper pole of left kidney. 2. No loss of cortical-medullary distinction of either kidney. 3. Foley decompressed urinary bladder. 4. Small amount of perihepatic free fluid. Electronically Signed   By: Ashley Royalty M.D.   On: 11/15/2017 00:20   Dg Chest Port 1 View  Result Date: 11/20/2017 CLINICAL DATA:  Evaluation after placement of left basilar chest tube to water seal. Original placement of pigtail chest tube on 11/12/2017 to treat loculated basilar pleural effusion. EXAM: PORTABLE CHEST 1 VIEW COMPARISON:  11/17/2017 FINDINGS: Interval extubation and removal of nasogastric tube. Left basilar chest tube shows stable course and positioning. No significant residual pleural effusion. Mild bibasilar atelectasis. No pneumothorax. Stable heart size. IMPRESSION: No pneumothorax. No significant residual left-sided pleural effusion. Electronically Signed   By: Aletta Edouard M.D.   On: 11/20/2017 15:02   Dg Chest Port 1 View  Result Date: 11/17/2017 CLINICAL DATA:  Intubated EXAM: PORTABLE CHEST 1 VIEW COMPARISON:  11/14/2017 FINDINGS: Support devices are stable. Left basilar chest tube remains in place. No pneumothorax. Heart is borderline in size. Left base atelectasis, improving since prior study. IMPRESSION: Stable support devices.  No pneumothorax. Left base atelectasis, improving since prior study. Electronically Signed   By: Rolm Baptise M.D.   On: 11/17/2017 09:01   Portable Chest Xray  Result Date: 11/14/2017 CLINICAL DATA:  Post op bowel resections EXAM: PORTABLE CHEST 1 VIEW COMPARISON:  11/14/2017 FINDINGS: Endotracheal tube tip is about 8.2 cm superior to carina. Esophageal tube tip is below the diaphragm. Left lower chest drainage catheter as before. Small pleural effusions. Mild cardiomegaly. No pneumothorax. Mild atelectasis or focus of infiltrate in the left mid lung. Left lung base consolidation. Left-sided central venous catheter tip overlies the brachiocephalic  confluence. No pneumothorax. IMPRESSION: 1. Endotracheal tube tip about 8.2 cm superior to carina 2. Small left effusion with left lower lobe atelectasis or infiltrate, slight worsening. 3. Mild cardiomegaly Electronically Signed   By: Donavan Foil M.D.   On: 11/14/2017 23:03   Dg Chest Port 1 View  Result Date: 11/14/2017 CLINICAL DATA:  82 year old male with altered mental status. EXAM: PORTABLE CHEST 1 VIEW COMPARISON:  11/14/2017. FINDINGS: Cardiomediastinal silhouette enlarged but stable. There is mild pulmonary vascular congestion without frank edema. No focal parenchymal consolidation. Likely small left pleural effusion and associated atelectasis, superimposed consolidation not excluded. No pneumothorax identified. Pigtail catheter overlies the left upper quadrant. No acute osseous abnormalities. IMPRESSION: Grossly stable appearance of the chest with small left basilar effusion/atelectasis, superimposed consolidation not excluded. Electronically Signed   By: Kristopher Oppenheim M.D.   On: 11/14/2017 13:39   Dg Chest Port 1 View  Result Date: 11/14/2017 CLINICAL DATA:  Left pleural effusion. EXAM: PORTABLE CHEST 1 VIEW COMPARISON:  Radiographs of November 08, 2017. FINDINGS: Stable cardiomediastinal silhouette. No pneumothorax is noted. Right lung is clear. Mild left basilar atelectasis or infiltrate is noted with possible minimal left pleural effusion. Bony thorax is unremarkable. IMPRESSION: Mild left basilar atelectasis or infiltrate is noted with minimal left pleural effusion. Electronically Signed   By: Marijo Conception, M.D.   On: 11/14/2017 07:30   Dg Swallowing Func-speech Pathology  Result Date: 11/19/2017 Objective Swallowing Evaluation: Type of Study: Bedside Swallow Evaluation  Patient Details Name: Oaklee Sunga MRN: [id number] Date of Birth: 1935-03-24 Today's Date: 11/19/2017 Time: SLP Start Time (ACUTE ONLY): 5009 -SLP Stop Time (ACUTE ONLY): 3818 SLP Time Calculation (min) (ACUTE  ONLY): 16  min Past Medical History: Past Medical History: Diagnosis Date . Anemia  . Bladder diverticulum 11/10/2017 . Esophageal ulcer   hx/notes 09/15/2017 . Gastric ulcer   hx/notes 09/15/2017 . GI bleed  . History of blood transfusion 07/2017 . NSAID induced gastritis  Past Surgical History: Past Surgical History: Procedure Laterality Date . COLONOSCOPY  ~ 05/2016 . ESOPHAGOGASTRODUODENOSCOPY (EGD) WITH PROPOFOL N/A 07/25/2017  Procedure: ESOPHAGOGASTRODUODENOSCOPY (EGD) WITH PROPOFOL;  Surgeon: Mauri Pole, MD;  Location: [facility];  Service: Endoscopy;  Laterality: N/A; . IR CATHETER TUBE CHANGE  10/28/2017 . IR RADIOLOGIST EVAL & MGMT  09/30/2017 . IR RADIOLOGIST EVAL & MGMT  10/19/2017 . LAPAROTOMY N/A 11/14/2017  Procedure: EXPLORATORY LAPAROTOMY WITH HARTMANN PROCEDURE;  Surgeon: Clovis Riley, MD;  Location: [facility];  Service: General;  Laterality: N/A; HPI: Pt is an 82 year old male with a known peptic ulcer disease, history of GI bleed, diverticulosis who presents with draining around indwelling catheter secondary to diverticulitis with abscess who was brought to the ED with increasing weight loss, GI spasms, cough plus leaking around his diverticular catheter site. Pt now s/p ex lap with partial sigmoid colectomy on 11/25. Of note, pt was intubated from 11/25-11/28.  Subjective: pt alert but not a great historian, provides inconsistent descriptions Assessment / Plan / Recommendation CHL IP CLINICAL IMPRESSIONS 11/19/2017 Clinical Impression -- SLP Visit Diagnosis -- Attention and concentration deficit following -- Frontal lobe and executive function deficit following -- Impact on safety and function (No Data)   CHL IP TREATMENT RECOMMENDATION 11/19/2017 Treatment Recommendations Therapy as outlined in treatment plan below   Prognosis 11/19/2017 Prognosis for Safe Diet Advancement Good Barriers to Reach Goals Cognitive deficits Barriers/Prognosis Comment -- CHL IP DIET RECOMMENDATION 11/19/2017  SLP Diet Recommendations Dysphagia 2 (Fine chop) solids;Thin liquid Liquid Administration via Cup;No straw Medication Administration Whole meds with puree Compensations Small sips/bites;Minimize environmental distractions;Slow rate;Clear throat intermittently Postural Changes Seated upright at 90 degrees   CHL IP OTHER RECOMMENDATIONS 11/19/2017 Recommended Consults -- Oral Care Recommendations Oral care BID Other Recommendations --   CHL IP FOLLOW UP RECOMMENDATIONS 11/19/2017 Follow up Recommendations (No Data)   CHL IP FREQUENCY AND DURATION 11/19/2017 Speech Therapy Frequency (ACUTE ONLY) min 2x/week Treatment Duration 2 weeks      CHL IP ORAL PHASE 11/19/2017 Oral Phase Impaired Oral - Pudding Teaspoon -- Oral - Pudding Cup -- Oral - Honey Teaspoon -- Oral - Honey Cup -- Oral - Nectar Teaspoon -- Oral - Nectar Cup -- Oral - Nectar Straw -- Oral - Thin Teaspoon -- Oral - Thin Cup Delayed oral transit;Premature spillage Oral - Thin Straw Delayed oral transit Oral - Puree Delayed oral transit Oral - Mech Soft -- Oral - Regular Delayed oral transit Oral - Multi-Consistency -- Oral - Pill -- Oral Phase - Comment --  CHL IP PHARYNGEAL PHASE 11/19/2017 Pharyngeal Phase Impaired Pharyngeal- Pudding Teaspoon -- Pharyngeal -- Pharyngeal- Pudding Cup -- Pharyngeal -- Pharyngeal- Honey Teaspoon -- Pharyngeal -- Pharyngeal- Honey Cup -- Pharyngeal -- Pharyngeal- Nectar Teaspoon -- Pharyngeal -- Pharyngeal- Nectar Cup -- Pharyngeal -- Pharyngeal- Nectar Straw -- Pharyngeal -- Pharyngeal- Thin Teaspoon -- Pharyngeal -- Pharyngeal- Thin Cup Penetration/Aspiration during swallow;Reduced airway/laryngeal closure;Penetration/Aspiration before swallow;Pharyngeal residue - pyriform Pharyngeal Material enters airway, remains ABOVE vocal cords and not ejected out Pharyngeal- Thin Straw Penetration/Aspiration during swallow;Reduced airway/laryngeal closure Pharyngeal Material enters airway, remains ABOVE vocal cords and not ejected  out Pharyngeal- Puree WFL Pharyngeal -- Pharyngeal- Mechanical Soft -- Pharyngeal -- Pharyngeal- Regular WFL Pharyngeal -- Pharyngeal-  Multi-consistency -- Pharyngeal -- Pharyngeal- Pill -- Pharyngeal -- Pharyngeal Comment --  CHL IP CERVICAL ESOPHAGEAL PHASE 11/19/2017 Cervical Esophageal Phase (No Data) Pudding Teaspoon -- Pudding Cup -- Honey Teaspoon -- Honey Cup -- Nectar Teaspoon -- Nectar Cup -- Nectar Straw -- Thin Teaspoon -- Thin Cup -- Thin Straw -- Puree -- Mechanical Soft -- Regular -- Multi-consistency -- Pill -- Cervical Esophageal Comment -- No flowsheet data found. Houston Siren 11/19/2017, 1:24 PM              Ct Perc Pleural Drain W/indwell Cath W/img Guide  Result Date: 11/12/2017 INDICATION: 82 year old with a loculated left pleural effusion. Plan for CT-guided chest tube placement. EXAM: CT-GUIDED PLACEMENT OF LEFT CHEST TUBE MEDICATIONS: None ANESTHESIA/SEDATION: Fentanyl 50 mcg IV; Versed 1.0 mg IV Moderate Sedation Time:  24 minutes The patient was continuously monitored during the procedure by the interventional radiology nurse under my direct supervision. COMPLICATIONS: None immediate. PROCEDURE: Informed written consent was obtained from the patient after a thorough discussion of the procedural risks, benefits and alternatives. All questions were addressed. A timeout was performed prior to the initiation of the procedure. Patient was placed prone. CT images through the chest were obtained. Posterior loculated left pleural effusion was targeted. The left side of the back was prepped and draped in sterile fashion. Skin and soft tissues were anesthetized with 1% lidocaine. 18 gauge trocar needle was directed into the pleural space with CT guidance. Clear yellow fluid was aspirated. Stiff Amplatz wire was placed. Tract was dilated to accommodate a 12 [place] multipurpose drain. Drain was advanced over the wire. Clear yellow fluid was aspirated and sent for culture. Catheter was  sutured to the skin and attached to a pleural fluid evacuation device. FINDINGS: Loculated effusion along the posterior left lower chest. IMPRESSION: CT-guided placement of a chest tube within the loculated left pleural effusion. Electronically Signed   By: Markus Daft M.D.   On: 11/12/2017 15:10     Nuala Alpha, DO 11/24/2017, 7:18 AM PGY-1, [place] Intern pager: [phone], text pages welcome

## 2017-11-24 NOTE — Progress Notes (Signed)
Physical Therapy Treatment Patient Details Name: Roberto Owen MRN: [id number] DOB: 07/26/1935 Today's Date: 11/24/2017    History of Present Illness Pt is an 82 year old male with a known peptic ulcer disease, history of GI bleed, diverticulosis who presents with draining around indwelling catheter secondary to diverticulitis with abscess who was brought to the ED with increasing weight loss, GI spasms, cough plus leaking around his diverticular catheter site. Pt now s/p ex lap with partial sigmoid colectomy on 11/25. Of note, pt was intubated from 11/25-11/28.    PT Comments    Pt feeling a lot better this session. Supervision for bed mobility supine to sit with increased time and effort. Min guard- Min A for transfers from bed with RW. VC for proper technique. Pt able to ambulate increased distance in hallway. Frequent cueing for erect posture and to lift head upright. Pt quickly fatigued during gait but with motivation was able to take a couple more steps before sitting. Informed nurse of small bleeding area on pt sacrum. Nurse assisted and applied foam border dressing during session. Pt will continue to benefit from therapy to help increase their strength and endurance.   Follow Up Recommendations  SNF;Supervision/Assistance - 24 hour     Equipment Recommendations  Other (comment)(TBD at next venue)    Recommendations for Other Services       Precautions / Restrictions Precautions Precautions: Fall Precaution Comments: L UQ colostomy, abdominal wound VAC Restrictions Weight Bearing Restrictions: No    Mobility  Bed Mobility Overal bed mobility: Needs Assistance       Supine to sit: Supervision;HOB elevated     General bed mobility comments: supervision for safety. Increased time and effort with use of rails to help elevate trunk. VC to scoot hip forward once EOB.  Transfers Overall transfer level: Needs assistance Equipment used: Rolling walker (2 wheeled) Transfers:  Sit to/from Stand Sit to Stand: Min guard min assist        General transfer comment: min guard - min assist for safety. VC for proper hand placement, power up through BLE and erect posture.  Ambulation/Gait Ambulation/Gait assistance: Min assist Ambulation Distance (Feet): 30 Feet Assistive device: Rolling walker (2 wheeled) Gait Pattern/deviations: Decreased stride length;Step-to pattern;Step-through pattern;Trunk flexed Gait velocity: decreased   General Gait Details: VC for erect posture, to lift head upright during ambulation and RW proximity.   Stairs            Wheelchair Mobility    Modified Rankin (Stroke Patients Only)       Balance Overall balance assessment: Needs assistance Sitting-balance support: Feet supported;Single extremity supported Sitting balance-Leahy Scale: Fair     Standing balance support: Bilateral upper extremity supported Standing balance-Leahy Scale: Poor Standing balance comment: pt reliant on BUE support from RW when standing.  Pt able to stand with single extremity support on RW while SPTA adjusted RW.                             Cognition Arousal/Alertness: Awake/alert Behavior During Therapy: WFL for tasks assessed/performed Overall Cognitive Status: Within Functional Limits for tasks assessed                                        Exercises General Exercises - Lower Extremity Ankle Circles/Pumps: AROM;20 reps;Supine;Both Long Arc Quad: AROM;Seated;10 reps;Both Hip Flexion/Marching: AROM;10 reps;Both;Seated Toe Raises: Both;AROM;Seated;20  reps Heel Raises: AROM;20 reps;Both;Seated Other Exercises Other Exercises: towel squeeze 10 reps 5 sec holds    General Comments        Pertinent Vitals/Pain Pain Assessment: 0-10 Pain Score: 5  Pain Location: abdomen Pain Descriptors / Indicators: Sore;Discomfort;Aching Pain Intervention(s): Monitored during session;Repositioned    Home Living                       Prior Function            PT Goals (current goals can now be found in the care plan section) Acute Rehab PT Goals Patient Stated Goal: to get better Time For Goal Achievement: 12/07/17(per plan of care set by PT) Potential to Achieve Goals: Good Progress towards PT goals: Progressing toward goals    Frequency    Min 3X/week      PT Plan Current plan remains appropriate    Co-evaluation              AM-PAC PT "6 Clicks" Daily Activity  Outcome Measure  Difficulty turning over in bed (including adjusting bedclothes, sheets and blankets)?: Unable Difficulty moving from lying on back to sitting on the side of the bed? : A Little Difficulty sitting down on and standing up from a chair with arms (e.g., wheelchair, bedside commode, etc,.)?: Unable Help needed moving to and from a bed to chair (including a wheelchair)?: A Lot Help needed walking in hospital room?: A Lot Help needed climbing 3-5 steps with a railing? : Total 6 Click Score: 10    End of Session Equipment Utilized During Treatment: Gait belt Activity Tolerance: Patient tolerated treatment well Patient left: in chair;with chair alarm set;with call bell/phone within reach Nurse Communication: Mobility status;Other (comment)(informed nurse of wound on pt sacrum.) PT Visit Diagnosis: Unsteadiness on feet (R26.81);Muscle weakness (generalized) (M62.81)     Time: 7342-8768 PT Time Calculation (min) (ACUTE ONLY): 28 min  Charges:  $Gait Training: 8-22 mins $Therapeutic Exercise: 8-22 mins                    G Codes:  Functional Assessment Tool Used: AM-PAC 6 Clicks Basic Mobility    Fransisca Connors, SPTA   Fransisca Connors 11/24/2017, 3:21 PM

## 2017-11-24 NOTE — Progress Notes (Signed)
Lotion applied to BUE and BLE.

## 2017-11-24 NOTE — Progress Notes (Addendum)
CSW is covering patient for the day. CSW met patient at bedside and discuss with him the importance of picking a facility. Patient stated he wants daughter [name] to make decision. CSW contacted patients daughter several times but was unable to reach daughter. Daughters voicemail is also full and unable to leave voicemail.   5:00PM  CSW was contacted by RN stating patients daughter was in the room. RN stated to daughter to contact CSW via phone. CSW never received a phone call by patients daughter. CSW went up to check on daughter and daughter left hospital. CSW tired contacting daughter again via phone several times and daughter not picking up.   Rhea Pink, MSW,  [place]

## 2017-11-24 NOTE — Progress Notes (Signed)
Nutrition Follow-up  DOCUMENTATION CODES:   Severe malnutrition in context of chronic illness  INTERVENTION:   -Continue Ensure Enlive po TID, each supplement provides 350 kcal and 20 grams of protein -30 ml Prostat BID, each supplement provides 100 kcals and 15 grams protein -Continue MVI daily  NUTRITION DIAGNOSIS:   Severe Malnutrition related to chronic illness(altered GI function) as evidenced by severe muscle depletion, severe fat depletion, percent weight loss(8% in 3 months).  Ongoing  GOAL:   Patient will meet greater than or equal to 90% of their needs  Progressing  MONITOR:   PO intake, Supplement acceptance, Diet advancement, Labs, Weight trends, Skin, I & O's  REASON FOR ASSESSMENT:   Consult Assessment of nutrition requirement/status  ASSESSMENT:   Pt with PMH of anemia, peptic ulcer disease, hx of GI bleed, diverticulosis presents with colonic obstruction and loculated pleural effusion possibly causing persistent hiccups  11/25-S/P sigmoid colectomy and colostomyfor perforated viscus 11/28- extubated 11/29- transferred from ICU to floor; per surgery note, pathology positive for T4N1 perforated adenocarcinoma 11/30- s/p MBSS- advanced to dysphagia 2 diet with thin liquids, cortrak tube removed 11/30- oncology consulted; plan for PET scan 12/1- lt chest tube removed 12/4- hypoglycemic episode  12/5- hypoglycemic episode  Pt resting in recliner chair at time of visit; RD did not disturb.   Intake continues to be poor; meal completion 0-45%. RD noted multiple episodes of hypoglycemia. Pt is consuming Ensure supplements about 50% of the time.   Reviewed CSW notes; pt awaiting daughter to make decision on SNF placement.   Labs reviewed: CBGS: 41-93.   Diet Order:  DIET DYS 2 Room service appropriate? Yes; Fluid consistency: Thin  EDUCATION NEEDS:   Education needs have been addressed  Skin:  Skin Assessment: Skin Integrity Issues: Skin Integrity  Issues:: Wound VAC Wound Vac: abdomen Incisions: abdomen, with wound vac  Last BM:  11/22/17 (150 ml output via colostomy)  Height:   Ht Readings from Last 1 Encounters:  11/18/17 6\' 6"  (1.981 m)    Weight:   Wt Readings from Last 1 Encounters:  11/24/17 224 lb 6.9 oz (101.8 kg)    Ideal Body Weight:  97 kg  BMI:  Body mass index is 25.94 kg/m.  Estimated Nutritional Needs:   Kcal:  2200-2400  Protein:  120-135 grams  Fluid:  > 2.2 L    Aidenn Skellenger A. Jimmye Norman, RD, LDN, CDE Pager: [phone] After hours Pager: [phone]

## 2017-11-24 NOTE — Clinical Social Work Note (Addendum)
Patient has not chosen a facility yet. CSW explained importance of choosing one as soon as possible because insurance will stop paying if patient is stable. Patient states he is eager to start rehab so he can return home as soon as possible. Patient gave CSW permission to call his daughter. Voicemail is full. CSW will try again later.  Dayton Scrape, CSW [phone]  11:19 am CSW tried calling patient's daughter again. No answer and voicemail full.  Dayton Scrape, [place]

## 2017-11-24 NOTE — Plan of Care (Signed)
  Pain Managment: General experience of comfort will improve 11/24/2017 0058 - Progressing by Kiyanna Biegler A, RN   Safety: Ability to remain free from injury will improve 11/24/2017 0058 - Progressing by Traye Bates, Roma Kayser, RN

## 2017-11-24 NOTE — Progress Notes (Signed)
1654 - CBG 47 1718 - D50 IV push given. 1718 - CBG 87 MD aware.

## 2017-11-24 NOTE — Plan of Care (Signed)
  Safety: Ability to remain free from injury will improve 11/24/2017 0905 - Progressing by Imagene Gurney, RN

## 2017-11-24 NOTE — Progress Notes (Addendum)
1137 - CBG = 41.  Pt asymptomatic. 1143 - per hypoglycemic protocol, gave D50, 1 amp IV push. 1215 - CBG 93 MD aware.

## 2017-11-24 NOTE — Progress Notes (Signed)
10 Days Post-Op   Subjective/Chief Complaint: Feels ok  Tolerating diet    Objective: Vital signs in last 24 hours: Temp:  [97.5 F (36.4 C)-98.3 F (36.8 C)] 98.3 F (36.8 C) (12/05 0528) Pulse Rate:  [62-83] 63 (12/05 0528) Resp:  [18] 18 (12/05 0528) BP: (116-128)/(46-64) 128/55 (12/05 0528) SpO2:  [100 %] 100 % (12/05 0528) Weight:  [101.8 kg (224 lb 6.9 oz)] 101.8 kg (224 lb 6.9 oz) (12/05 0528) Last BM Date: 11/22/17  Intake/Output from previous day: 12/04 0701 - 12/05 0700 In: 720 [P.O.:720] Out: 850 [Urine:250; Stool:600] Intake/Output this shift: No intake/output data recorded.  Incision/Wound:vac in place   Ostomy working viable   Lab Results:  Recent Labs    11/22/17 0556  WBC 9.6  HGB 8.5*  HCT 26.6*  PLT 355   BMET Recent Labs    11/23/17 0510 11/23/17 1835  NA 139 137  K 4.3 4.1  CL 111 112*  CO2 20* 18*  GLUCOSE 82 168*  BUN 20 22*  CREATININE 2.46* 2.73*  CALCIUM 7.9* 7.8*   PT/INR No results for input(s): LABPROT, INR in the last 72 hours. ABG No results for input(s): PHART, HCO3 in the last 72 hours.  Invalid input(s): PCO2, PO2  Studies/Results: No results found.  Anti-infectives: Anti-infectives (From admission, onward)   Start     Dose/Rate Route Frequency Ordered Stop   11/18/17 1800  vancomycin (VANCOCIN) IVPB 1000 mg/200 mL premix     1,000 mg 200 mL/hr over 60 Minutes Intravenous Every 48 hours 11/17/17 1419 11/22/17 2252   11/15/17 0515  vancomycin (VANCOCIN) 1,250 mg in sodium chloride 0.9 % 250 mL IVPB  Status:  Discontinued     1,250 mg 166.7 mL/hr over 90 Minutes Intravenous Every 24 hours 11/15/17 0502 11/15/17 1746   11/14/17 2315  vancomycin (VANCOCIN) IVPB 750 mg/150 ml premix  Status:  Discontinued     750 mg 150 mL/hr over 60 Minutes Intravenous Every 12 hours 11/14/17 2306 11/14/17 2309   11/13/17 1600  vancomycin (VANCOCIN) IVPB 750 mg/150 ml premix  Status:  Discontinued     750 mg 150 mL/hr over 60  Minutes Intravenous Every 12 hours 11/13/17 1057 11/14/17 2306   11/11/17 1600  vancomycin (VANCOCIN) IVPB 1000 mg/200 mL premix  Status:  Discontinued     1,000 mg 200 mL/hr over 60 Minutes Intravenous Every 12 hours 11/11/17 0443 11/13/17 1057   11/09/17 1600  vancomycin (VANCOCIN) IVPB 750 mg/150 ml premix  Status:  Discontinued     750 mg 150 mL/hr over 60 Minutes Intravenous Every 12 hours 11/09/17 1500 11/11/17 0443   11/09/17 0800  vancomycin (VANCOCIN) IVPB 750 mg/150 ml premix  Status:  Discontinued     750 mg 150 mL/hr over 60 Minutes Intravenous Every 12 hours 11/08/17 2152 11/09/17 1500   11/09/17 0200  piperacillin-tazobactam (ZOSYN) IVPB 3.375 g  Status:  Discontinued     3.375 g 12.5 mL/hr over 240 Minutes Intravenous Every 8 hours 11/08/17 1821 11/22/17 2359   11/08/17 2200  vancomycin (VANCOCIN) 2,000 mg in sodium chloride 0.9 % 500 mL IVPB     2,000 mg 250 mL/hr over 120 Minutes Intravenous  Once 11/08/17 2152 11/09/17 0020   11/08/17 1830  piperacillin-tazobactam (ZOSYN) IVPB 3.375 g     3.375 g 100 mL/hr over 30 Minutes Intravenous  Once 11/08/17 1821 11/08/17 2134   11/08/17 1745  piperacillin-tazobactam (ZOSYN) IVPB 3.375 g  Status:  Discontinued     3.375  g 100 mL/hr over 30 Minutes Intravenous  Once 11/08/17 1733 11/08/17 1734      Assessment/Plan: s/p Procedure(s): EXPLORATORY LAPAROTOMY WITH HARTMANN PROCEDURE (N/A) Stable  Await placement   LOS: 16 days    Roberto Owen 11/24/2017

## 2017-11-25 LAB — GLUCOSE, CAPILLARY
GLUCOSE-CAPILLARY: 75 mg/dL (ref 65–99)
Glucose-Capillary: 68 mg/dL (ref 65–99)
Glucose-Capillary: 70 mg/dL (ref 65–99)
Glucose-Capillary: 71 mg/dL (ref 65–99)
Glucose-Capillary: 75 mg/dL (ref 65–99)
Glucose-Capillary: 83 mg/dL (ref 65–99)
Glucose-Capillary: 84 mg/dL (ref 65–99)

## 2017-11-25 LAB — BASIC METABOLIC PANEL
ANION GAP: 6 (ref 5–15)
BUN: 22 mg/dL — ABNORMAL HIGH (ref 6–20)
CO2: 18 mmol/L — AB (ref 22–32)
Calcium: 8 mg/dL — ABNORMAL LOW (ref 8.9–10.3)
Chloride: 115 mmol/L — ABNORMAL HIGH (ref 101–111)
Creatinine, Ser: 2.65 mg/dL — ABNORMAL HIGH (ref 0.61–1.24)
GFR calc Af Amer: 25 mL/min — ABNORMAL LOW (ref >60)
GFR calc non Af Amer: 21 mL/min — ABNORMAL LOW (ref >60)
GLUCOSE: 86 mg/dL (ref 65–99)
POTASSIUM: 4.4 mmol/L (ref 3.5–5.1)
Sodium: 139 mmol/L (ref 135–145)

## 2017-11-25 NOTE — Progress Notes (Signed)
  Speech Language Pathology Treatment: Dysphagia  Patient Details Name: Roberto Owen MRN: [id number] DOB: 01-28-1935 Today's Date: 11/25/2017 Time: 0930-1000 SLP Time Calculation (min) (ACUTE ONLY): 30 min  Assessment / Plan / Recommendation Clinical Impression  Orders received for repeat MBS. Per telephone consult with MD, pt and daughter are reporting difficulty swallowing. Per RN, lungs are stable, no temp elevations, and pt has not demonstrated overt difficulty swallowing. SLP provided peanut butter and graham crackers with water. No overt s/s aspiration noted with either consistency. When asked, pt reports he has no difficulty swallowing. He does indicate that due to multiple cups of orange juice being given, he is not hungry when his meal arrives. By the time he is hungry, the food is cold and he doesn't want to eat it.  At this time, repeat MBS is not warranted. Per MD request, SLP attempted to contact pt's daughter to discuss pt status. Unfortunately, there was no answer. SLP will continue efforts to contact her. MD notified of pt tolerance of po trials, attempt to contact daughter, and cancellation of MBS. ST will continue to monitor diet tolerance and provide education.     HPI HPI: Pt is an 82 year old male with a known peptic ulcer disease, history of GI bleed, diverticulosis who presents with draining around indwelling catheter secondary to diverticulitis with abscess who was brought to the ED with increasing weight loss, GI spasms, cough plus leaking around his diverticular catheter site. Pt now s/p ex lap with partial sigmoid colectomy on 11/25. Of note, pt was intubated from 11/25-11/28. Pt underwent MBS 11/19/17, and has been tolerating Dys 2 diet and thin liquids. New orders for MBS received 11/25/17.       SLP Plan  Continue with current plan of care       Recommendations  Diet recommendations: Dysphagia 2 (fine chop);Thin liquid Liquids provided via: No  straw;Cup Medication Administration: Whole meds with puree Supervision: Patient able to self feed;Intermittent supervision to cue for compensatory strategies Compensations: Small sips/bites;Minimize environmental distractions;Slow rate;Clear throat intermittently Postural Changes and/or Swallow Maneuvers: Seated upright 90 degrees;Upright 30-60 min after meal                Oral Care Recommendations: Oral care QID Follow up Recommendations: None SLP Visit Diagnosis: Dysphagia, oropharyngeal phase (R13.12) Plan: Continue with current plan of care       GO              Reginaldo Hazard [facility], CCC-SLP Speech Language Pathologist [phone]  Shonna Chock 11/25/2017, 10:04 AM

## 2017-11-25 NOTE — Clinical Social Work Note (Addendum)
CSW called patient's daughter and was able to leave a voicemail this time.  Dayton Scrape, CSW [phone]  8:34 am CSW received call back from patient's daughter. She is deciding between [facility] and will visit both today. Patient's daughter voiced concerns about poor PO intake, difficulty swallowing, and varying blood sugars. She states she addressed these with the MD yesterday. Patient's daughter will notify CSW once she has chosen a facility but stated based on scores, she will likely choose [facility].  Dayton Scrape, CSW [phone]  10:14 am CSW left voicemail for daughter. Will discuss that patient is stable for discharge to SNF today when she calls back.  Dayton Scrape, [place] [phone]  10:20 am CSW spoke with hospital liaison for [facility] to notify her that patient's daughter is leaning towards choosing their facility. She stated she will need to know decision as soon as possible because it will take about 4 hours for wound vac to be delivered once ordered. Will notify patient's daughter once she calls back.  Dayton Scrape, CSW [phone]  11:40 am CSW called patient's daughter again. Did not leave a second voicemail.  Dayton Scrape, CSW [phone]  12:50 pm CSW spoke with patient and his son at bedside. CSW made patient aware of two options his daughter is considering. Patient and his son stated that his daughter is preparing to cover her boss while she is out of the country for two weeks so that is why it is so difficult to get her on the phone. Patient's son will try texting her. CSW told patient's son to have daughter text CSW as well if that is easier. CSW notified patient and his son of importance in picking facility as soon as possible so that wound vac can be ordered.  Dayton Scrape, CSW [phone]  1:38 pm CSW called patient's daughter and left another HIPAA compliant voicemail asking if she had toured  either of the facilities we discussed this morning. CSW also noted on voicemail that patient is stable for discharge so a facility needs to be chosen as soon as possible so equipment can be ordered. According to hospital liaison for [facility], patient's daughter has not toured their facility yet.  Dayton Scrape, CSW [phone]  3:28 pm CSW received call from patient's daughter. She plans to visit one of the facilities on her way home from work today and then the other on her way into work tomorrow. She will text CSW her decision in the morning after touring the second facility with plan for discharge tomorrow.  Dayton Scrape, [place]

## 2017-11-25 NOTE — Progress Notes (Signed)
11 Days Post-Op   Subjective/Chief Complaint: NO COMPALINTS    Objective: Vital signs in last 24 hours: Temp:  [97.4 F (36.3 C)-98.2 F (36.8 C)] 98.2 F (36.8 C) (12/06 0102) Pulse Rate:  [61-76] 71 (12/06 0632) Resp:  [18-20] 18 (12/06 7253) BP: (122-132)/(54-63) 132/54 (12/06 6644) SpO2:  [96 %-100 %] 100 % (12/06 0347) Weight:  [102.6 kg (226 lb 3.1 oz)] 102.6 kg (226 lb 3.1 oz) (12/06 4259) Last BM Date: 11/25/17  Intake/Output from previous day: 12/05 0701 - 12/06 0700 In: 5638 [P.O.:1517] Out: 1250 [Urine:800; Stool:450] Intake/Output this shift: No intake/output data recorded.  Incision/Wound:vac in place  Soft ND NT   Lab Results:  No results for input(s): WBC, HGB, HCT, PLT in the last 72 hours. BMET Recent Labs    11/23/17 0510 11/23/17 1835  NA 139 137  K 4.3 4.1  CL 111 112*  CO2 20* 18*  GLUCOSE 82 168*  BUN 20 22*  CREATININE 2.46* 2.73*  CALCIUM 7.9* 7.8*   PT/INR No results for input(s): LABPROT, INR in the last 72 hours. ABG No results for input(s): PHART, HCO3 in the last 72 hours.  Invalid input(s): PCO2, PO2  Studies/Results: No results found.  Anti-infectives: Anti-infectives (From admission, onward)   Start     Dose/Rate Route Frequency Ordered Stop   11/18/17 1800  vancomycin (VANCOCIN) IVPB 1000 mg/200 mL premix     1,000 mg 200 mL/hr over 60 Minutes Intravenous Every 48 hours 11/17/17 1419 11/22/17 2252   11/15/17 0515  vancomycin (VANCOCIN) 1,250 mg in sodium chloride 0.9 % 250 mL IVPB  Status:  Discontinued     1,250 mg 166.7 mL/hr over 90 Minutes Intravenous Every 24 hours 11/15/17 0502 11/15/17 1746   11/14/17 2315  vancomycin (VANCOCIN) IVPB 750 mg/150 ml premix  Status:  Discontinued     750 mg 150 mL/hr over 60 Minutes Intravenous Every 12 hours 11/14/17 2306 11/14/17 2309   11/13/17 1600  vancomycin (VANCOCIN) IVPB 750 mg/150 ml premix  Status:  Discontinued     750 mg 150 mL/hr over 60 Minutes Intravenous Every 12  hours 11/13/17 1057 11/14/17 2306   11/11/17 1600  vancomycin (VANCOCIN) IVPB 1000 mg/200 mL premix  Status:  Discontinued     1,000 mg 200 mL/hr over 60 Minutes Intravenous Every 12 hours 11/11/17 0443 11/13/17 1057   11/09/17 1600  vancomycin (VANCOCIN) IVPB 750 mg/150 ml premix  Status:  Discontinued     750 mg 150 mL/hr over 60 Minutes Intravenous Every 12 hours 11/09/17 1500 11/11/17 0443   11/09/17 0800  vancomycin (VANCOCIN) IVPB 750 mg/150 ml premix  Status:  Discontinued     750 mg 150 mL/hr over 60 Minutes Intravenous Every 12 hours 11/08/17 2152 11/09/17 1500   11/09/17 0200  piperacillin-tazobactam (ZOSYN) IVPB 3.375 g  Status:  Discontinued     3.375 g 12.5 mL/hr over 240 Minutes Intravenous Every 8 hours 11/08/17 1821 11/22/17 2359   11/08/17 2200  vancomycin (VANCOCIN) 2,000 mg in sodium chloride 0.9 % 500 mL IVPB     2,000 mg 250 mL/hr over 120 Minutes Intravenous  Once 11/08/17 2152 11/09/17 0020   11/08/17 1830  piperacillin-tazobactam (ZOSYN) IVPB 3.375 g     3.375 g 100 mL/hr over 30 Minutes Intravenous  Once 11/08/17 1821 11/08/17 2134   11/08/17 1745  piperacillin-tazobactam (ZOSYN) IVPB 3.375 g  Status:  Discontinued     3.375 g 100 mL/hr over 30 Minutes Intravenous  Once 11/08/17 1733  11/08/17 1734      Assessment/Plan: s/p Procedure(s): EXPLORATORY LAPAROTOMY WITH HARTMANN PROCEDURE (N/A) STABLE FOR PLACEMENT  CONT WOUND VAC   LOS: 17 days    Marcello Moores A Attilio Zeitler 11/25/2017

## 2017-11-25 NOTE — Progress Notes (Signed)
Family Medicine Teaching Service Daily Progress Note Intern Pager: [phone]  Patient name: Roberto Owen Medical record number: [id number] Date of birth: 1935/06/20 Age: 82 y.o. Gender: male  Primary Care Provider: Patient, No Pcp Per Consultants: Surgery, IR, Cardiology, Oncology  Code Status: Full   Pt Overview and Major Events to Date:  11/19 - admitted for GI spasms, LOA 11/22 - allowed to eat liquid diet per Surgery 11/23 - Left chest tube placement by IR 11/25 - perforated diverticuli requiring emergent colectomy 11/26 - found to have T4N1 colorectal carcinoma  Assessment and Plan: Eamon Tantillo a 82 y.o.malepresenting with an in place abdominal drain due to previous episode of diverticulitis with abscess of the sigmoid colon, developed bowel perforation s/p ex-lap and coloectomy with colostomy placed and found to have colorectal carcinoma. PMH is significant forPeptic ulcer disease, history of GI bleedand previous episode of diverticulosis.  Perforated viscus s/p ex lap and colostomy (Hartmann's Procedure): He is POD#11. - Surgery following, surgically stable for placement per note on 12/5 - Per Surgery, full course of antibiotics have been completed as of yesterday. Vanc 1g for 3 doses every 48hrs started on 11/29. Zosyn 3.375g q8 for 14 days, started on 11/20 (day 14).  - tylenol PRN - zofran PRN - dysphagia diet - patient will go to SNF per PT/OT recs - will call daughter today (12/5) as she and patient have been discussing SNF placement  Colorectal Carcinoma Patient was found to have colorectal carcinoma after ex lap and colostomy. Hematology and GI oncology on board. Pathology showed T4N1 perforated adenocarcinoma. - oncology following, follow up appointment with Dr. Benay Spice on 12/10 @ 2pm  - Hematology also following  Loculated Left Pleural Effusion Chest tube removed per IR on 12/2. CXR 12/1 stable with no significant remaining pleural effusion -  Vanc/Zosyn for HCAP coverage overlaps with coverage from ex-lap and colectomy with colostomy. Abx course completed as of 12/4. - Continuous pulse ox - IR has signed off  Hypoglycemia Patient had two episodes on 12/5 (41 and 47) but continues to endorse he has no symptoms. He was given several cups of orange juice and we have encouraged him to eat more. Patient has had persistently low glucose readings and is likely due to poor appetite. No sugar lowering medications on board. - discontinue CBGs -assess glucose on BMP -low glucose protocol -per nutrition Enlive to supplement caloric needs  New Onset Afib stable on oral amiodarone. Regualr rhythm today, rate is well controlled. - Cardiology has signed off and will follow up outpatient.  - Stopped Heparin due to small subdural hematomas found; neuro recs appreciated - continue amio 200mg  daily - continue Metoprolol 12.5 BID - restarted Eliquis after consulting with Hematology  Anemia Hemoglobin stable at 9.3 on 11/20/17, BL ~ 9.0 - Continue to monitor with CBC - per onc recs, will give IV feraheme during hospitalization and d/c w/ oral iron  Hypoalbuminemia due to Severe protein-calorie malnutrition ([place]) - Per Nutrition 24ml Prostat TID.  - dysphagia diet  AKI/CKD IV Appears to be in Stage 4 CKD per labs. Scr of 2.65 down from 2.73 yesterday. Baseline ~0.8. GFR stable. Renal U/S showed bilateral non-obstructive nephrolithasis. Calculi measure on the order of 8 mm each, one seen in the lower the right kidney and the second in the upper pole of left kidney. Suspect his continual rise in Cr is due to poor oral hydration. - discont am BMP - I/Os: total of 267 in. Output improved to 88mL urine.  Hypokalemia  Stable. K 4.1 today - monitor and replete as necessary  Small Subdural Hematoma: stable CT Head on right convexity 3.9 mm subdural hematoma without change on 11/26. MRI also on 11/27 showed no acute changes, stable 3-44mm  right cerebral convexity subdural hematoma. D/w neurosurgery who felt hematomas were stable and it was okay to restart anticoagulation -Continue heparin ppx for now -Hematology recommended starting elliquis   FEN/GI:Protonix, dysphagia 2 diet Prophylaxis:Heparin  Disposition: SNF  Subjective:  Patient today states he feels good. He did endorse intermittent hiccups and his abdomen is still sore only when he coughs. Otherwise, he does not endorse any mechanical of functional difficulty swallowing. He states he ate and drank more yesterday and understands that he must continue to do so. He is up and walking some and working with PT and making good progress.  Objective: Temp:  [97.4 F (36.3 C)-98.2 F (36.8 C)] 98.2 F (36.8 C) (12/06 3299) Pulse Rate:  [61-76] 71 (12/06 0632) Resp:  [18-20] 18 (12/06 2426) BP: (122-132)/(54-63) 132/54 (12/06 8341) SpO2:  [96 %-100 %] 100 % (12/06 9622) Physical Exam: Gen: Alert and Oriented x 3, NAD HEENT: Normocephalic, atraumatic, PERRLA, EOMI CV: RRR, normal rate, no murmurs, normal S1, S2 split, +2 pulses dorsalis pedis bilaterally, no JVD, no carotid bruits Resp: CTAB, no wheezing, rales, or rhonchi, comfortable work of breathing Abd: non-distended, non-tender, soft, +bs in all four quadrants, midline incision clean no signs of infection, colostomy bag in place in LUQ with good output MSK: FROM in all four extremities Ext: no clubbing, cyanosis, +1 pitting edema bilateral LE Skin: warm, dry, intact, no rashes  Laboratory: Recent Labs  Lab 11/19/17 0506 11/20/17 0423 11/22/17 0556  WBC 10.1 8.8 9.6  HGB 8.7* 9.3* 8.5*  HCT 27.2* 29.2* 26.6*  PLT 328 298 355   Recent Labs  Lab 11/20/17 0423  11/23/17 0510 11/23/17 1835 11/25/17 0931  NA 144   < > 139 137 139  K 4.0   < > 4.3 4.1 4.4  CL 122*   < > 111 112* 115*  CO2 17*   < > 20* 18* 18*  BUN 22*   < > 20 22* 22*  CREATININE 2.04*   < > 2.46* 2.73* 2.65*  CALCIUM 7.7*   < >  7.9* 7.8* 8.0*  PROT 5.9*  --   --   --   --   BILITOT 0.4  --   --   --   --   ALKPHOS 51  --   --   --   --   ALT 12*  --   --   --   --   AST 17  --   --   --   --   GLUCOSE 79   < > 82 168* 86   < > = values in this interval not displayed.     Imaging/Diagnostic Tests: Ct Abdomen Pelvis Wo Contrast  Result Date: 11/14/2017 CLINICAL DATA:  81 year old male presenting with signs and symptoms suspicious for potential bowel obstruction. EXAM: CT ABDOMEN AND PELVIS WITHOUT CONTRAST TECHNIQUE: Multidetector CT imaging of the abdomen and pelvis was performed following the standard protocol without IV contrast. COMPARISON:  CT the abdomen and pelvis 11/08/2017. FINDINGS: Lower chest: Pigtail drainage catheter in the lower left hemithorax posteriorly. Small left-sided pleural effusion lying dependently. Trace right pleural effusion lying dependently. Areas of apparent subsegmental atelectasis in the lower lobes of lungs bilaterally. Hepatobiliary: Numerous tiny calcified granulomas. No other definite suspicious  appearing hepatic lesions noted throughout the liver on today's noncontrast CT examination. Amorphous intermediate to high attenuation material lying dependently in the gallbladder, presumably biliary sludge. Pancreas: No definite pancreatic mass noted on today's noncontrast CT examination. Small amount of peripancreatic stranding noted, but nonspecific given generalize soft tissues trending throughout the retroperitoneum, mesenteric and diffuse body wall edema. Spleen: Unremarkable. Adrenals/Urinary Tract: Several nonobstructive calculi are noted within the collecting systems of both kidneys, measuring up to 8 mm in the lower pole collecting system of the right kidney. No ureteral stones. No hydroureteronephrosis. Bilateral adrenal glands are normal in appearance. Large right-sided bladder diverticulum with some high attenuation material lying dependently in this diverticulum, new compared to the  prior examination from 11/08/2017, potentially residual contrast material or proteinaceous/hemorrhagic debris. Stomach/Bowel: Unenhanced appearance of the stomach is unremarkable. No pathologic dilatation of small bowel. Colon appears moderately distended with multiple air-fluid levels. Previously noted diverticular abscess with indwelling pigtail drainage catheter in the distal descending colon (axial image 57 of series 3) remains completely decompressed. Soft tissue stranding adjacent to the decompressed abscess is noted, as well as adjacent locules of extraluminal gas (axial image 58 of series 3) which are new compared to the prior study. The appendix is not confidently identified and may be surgically absent. Regardless, there are no inflammatory changes noted adjacent to the cecum to suggest the presence of an acute appendicitis at this time. Vascular/Lymphatic: Aortic atherosclerosis, without evidence of aneurysm in the abdominal or pelvic vasculature. No lymphadenopathy noted in the abdomen or pelvis on today's noncontrast CT examination. Reproductive: Prostate gland and seminal vesicles are incompletely imaged. Other: New pneumoperitoneum (moderate volume) noted. Trace volume of ascites. Diffuse mesenteric and retroperitoneal edema. Musculoskeletal: Diffuse body wall edema. There are no aggressive appearing lytic or blastic lesions noted in the visualized portions of the skeleton. IMPRESSION: 1. New pneumoperitoneum compared to the prior examination, compatible with perforated viscus. The exact source of this is uncertain, but this is suspected be related to gas traversing the chronic diverticular abscess wall at the site of the indwelling catheter. This was discussed by phone with Dr. Romana Juniper. 2. Diffuse body wall edema, mesenteric edema and retroperitoneal edema with bilateral pleural effusions, suspicious for a state of anasarca. 3. Multiple nonobstructive calculi in the collecting systems of both  kidneys measuring up to 8 mm in the lower pole collecting system of the right kidney. There is also some new high attenuation material lying dependently in the large right bladder diverticulum. This could represent multiple tiny calculi, but this is unlikely given the development compared to the recent prior examination. Rather, this is favored to represent some proteinaceous/hemorrhagic debris or residual contrast material from prior CT scan 11/08/2017. 4. Aortic atherosclerosis. 5. Additional findings, as above. Aortic Atherosclerosis (ICD10-I70.0). These results were called by telephone at the time of interpretation on 11/14/2017 at 3:35 pm to Dr. Kae Heller, who verbally acknowledged these results. Electronically Signed   By: Vinnie Langton M.D.   On: 11/14/2017 15:48   Dg Chest 2 View  Result Date: 11/08/2017 CLINICAL DATA:  Pleural effusion. EXAM: CHEST  2 VIEW COMPARISON:  Radiographs of March 25, 2012. FINDINGS: Stable cardiomediastinal silhouette. No pneumothorax is noted. Right lung is clear. Mild left basilar atelectasis or infiltrate is noted. Bony thorax is unremarkable. IMPRESSION: Mild left basilar subsegmental atelectasis or infiltrate is noted. Followup PA and lateral chest X-ray is recommended in 3-4 weeks following trial of antibiotic therapy to ensure resolution and exclude underlying malignancy. Electronically Signed  By: Marijo Conception, M.D.   On: 11/08/2017 16:46   Ct Head Wo Contrast  Result Date: 11/15/2017 CLINICAL DATA:  82 year old male post abdominal surgery yesterday. Altered level of consciousness. Not waking up. Insert sub EXAM: CT HEAD WITHOUT CONTRAST TECHNIQUE: Contiguous axial images were obtained from the base of the skull through the vertex without intravenous contrast. COMPARISON:  11/14/2017 CT. FINDINGS: Brain: Right convexity 3.9 mm subdural hematoma without change. No significant associated mass effect. No new intracranial hemorrhage seen separate from the above  described finding. No CT evidence of large acute infarct or diffuse anoxia. Atrophy. No intracranial mass lesion noted on this unenhanced exam. Vascular: Vascular calcifications Skull: No acute abnormality Sinuses/Orbits: No acute orbital abnormality. Minimal mucosal thickening ethmoid sinus air cells. Other: Mastoid air cells and middle ear cavities are clear. IMPRESSION: Right convexity 3.9 mm subdural hematoma without change. No significant associated mass effect. No new intracranial hemorrhage. No CT evidence of large acute infarct or diffuse anoxia. Atrophy. Electronically Signed   By: Genia Del M.D.   On: 11/15/2017 14:04   Ct Head Wo Contrast  Result Date: 11/14/2017 CLINICAL DATA:  82 y/o  M; altered mental status, rule out stroke. EXAM: CT HEAD WITHOUT CONTRAST TECHNIQUE: Contiguous axial images were obtained from the base of the skull through the vertex without intravenous contrast. COMPARISON:  None. FINDINGS: Brain: No evidence of acute infarction, hemorrhage, hydrocephalus or mass lesion/mass effect. Mild chronic microvascular ischemic changes and parenchymal volume loss of the brain for age. 4 mm trace subdural collection in the right cerebral convexity with intermittent attenuation, probably subdural hematoma (series 10, image 39). No significant mass effect. Vascular: Calcific atherosclerosis of carotid siphons. No hyperdense vessel. Skull: Normal. Negative for fracture or focal lesion. Sinuses/Orbits: Mild ethmoid sinus mucosal thickening. Otherwise negative. Other: None. IMPRESSION: 1. Thin subdural collection over right cerebral convexity, likely subdural hematoma. No significant mass effect. 2. No evidence for stroke, brain parenchymal hemorrhage, or mass effect. 3. Mild chronic microvascular ischemic changes and parenchymal volume loss of the brain. Critical Value/emergent results were called by telephone at the time of interpretation on 11/14/2017 at 3:34 pm to Dr. Kae Heller, who verbally  acknowledged these results. Electronically Signed   By: Kristine Garbe M.D.   On: 11/14/2017 15:31   Ct Chest W Contrast  Result Date: 11/10/2017 CLINICAL DATA:  Pleural effusions. EXAM: CT CHEST WITH CONTRAST TECHNIQUE: Multidetector CT imaging of the chest was performed during intravenous contrast administration. CONTRAST:  44mL ISOVUE-300 IOPAMIDOL (ISOVUE-300) INJECTION 61% COMPARISON:  Two-view chest x-ray 11/08/2017 FINDINGS: Cardiovascular: The heart is enlarged. Coronary artery calcifications are present. Pulmonary artery is enlarged. The main pulmonary outflow tract measures 3.9 cm. The aorta is normal in size. Mediastinum/Nodes: Hyperdense material is present within the left hilum. This is greater than expected for calcification appears be some sort of embolic material. Lungs/Pleura: A loculated left lower lobe fusion is present. There is a large portion posteriorly inferiorly. A second loculated portion along the left major fissure. Right-sided pleural fluid is less certain to be loculated. There is significant volume loss in the left greater than right lower lobes. The upper lung fields are clear. Upper Abdomen: Unremarkable. 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 Vertebral body heights are otherwise normal. Rightward curvature of the upper thoracic spine is associated with the butterfly vertebral body. No focal lytic or blastic lesions are present. IMPRESSION: 1. Loculated bilateral pleural effusions, left  greater than right. 2. Associated airspace disease likely reflects atelectasis. 3. Additional pleural irregularity on the left may represent infection associated with the fusion. 4. 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 Dextroconvex curvature is associated. Electronically Signed   By: San Morelle M.D.   On: 11/10/2017 15:19   Mr Brain Wo Contrast  Result Date:  11/16/2017 CLINICAL DATA:  82 y/o M; asymmetry limb movement abnormality of uncertain etiology. History of subdural hematoma. EXAM: MRI HEAD WITHOUT CONTRAST TECHNIQUE: Multiplanar, multiecho pulse sequences of the brain and surrounding structures were obtained without intravenous contrast. COMPARISON:  11/15/2017 CT head. FINDINGS: Brain: No reduced diffusion to suggest acute or early subacute infarction. Intermediate T1 and high T2 signal acute 3-4 mm subdural hematoma over right cerebral convexity is stable. No significant mass effect. No new intracranial hemorrhage. Mild chronic microvascular ischemic changes of white matter and parenchymal volume loss of the brain. 4 mm right lateral ventricle nodule along the under belly of corpus callosum with increased T2 and intermediate T1 signal (series 11, image 17 and series 10, image 17). Vascular: Normal flow voids. Skull and upper cervical spine: Normal marrow signal. Sinuses/Orbits: Negative. Other: None. IMPRESSION: 1. No new acute intracranial abnormality. 2. Stable 3-4 mm right cerebral convexity subdural hematoma. 3. Mild chronic microvascular ischemic changes and mild parenchymal volume loss of the brain. 4. 4 mm right lateral ventricle nodule on under belly of corpus callosum, likely benign. Comparison with prior MRI imaging where available is recommended. Otherwise if clinically indicated consider 6-12 month follow-up to evaluate for stability. Electronically Signed   By: Kristine Garbe M.D.   On: 11/16/2017 14:53   Ct Abdomen Pelvis W Contrast  Result Date: 11/08/2017 CLINICAL DATA:  82 year old male. Left lower quadrant abscess secondary to diverticular disease in September with percutaneous drain placement. Subsequent feculent drainage, with fluoro guided injection demonstrating fistula to the colon. Drain occlusion on 10/28/2017. Up size an exchange for a new 14 [place] drain at that time. 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  Recent decreased drainage volume from catheter. Loss of appetite for 2 days with vomiting. EXAM: CT ABDOMEN AND PELVIS WITH CONTRAST TECHNIQUE: Multidetector CT imaging of the abdomen and pelvis was performed using the standard protocol following bolus administration of intravenous contrast. CONTRAST:  154mL ISOVUE-300 IOPAMIDOL (ISOVUE-300) INJECTION 61% COMPARISON:  Drain replacement images [id number]. CT Abdomen and Pelvis 09/30/2017. FINDINGS: Lower chest: New loculated appearing small to moderate size left pleural effusion with simple fluid density (series 3, image 6). Associated left lung base compressive atelectasis. No definite lower lobe or lingula consolidation. No pericardial effusion.  Stable and negative right lung base. Hepatobiliary: Stable and negative, scattered punctate calcified granulomas. Pancreas: Negative. Spleen: Negative. Adrenals/Urinary Tract: Normal adrenal glands. Bilateral renal enhancement and contrast excretion is symmetric and within normal limits. No hydronephrosis or hydroureter. 5 cm posterior right urinary bladder diverticulum appears unchanged. Stable and negative urinary bladder otherwise. Stomach/Bowel: Stable and negative rectum. Redundant sigmoid colon with persistent circumferential wall thickening in the proximal sigmoid just distal to the left abdominal percutaneous pigtail drain site (series 3, image 56). Adjacent left gutter/mesenteric stranding or trace fluid has not resolved since October and may be mildly increased (series 3, image 58). No extraluminal gas identified. The distal sigmoid appears stable and negative. The descending colon demonstrates increased distention with low-density stool to the splenic flexure. The lumen of the descending colon becomes narrow at the level of the pigtail drain and sigmoid wall thickening.  See coronal images 57 52 through 58. Distal descending colon wall thickening. Redundant transverse colon. No retained stool at the splenic flexure.  Small volume of fluid in the right colon. Respiratory motion artifact in the right lower quadrant. The cecum is on a lax mesentery. The appendix is not identified today. Nondilated distal small bowel. No dilated small bowel loops. Decompressed stomach and duodenum. No other abdominal free fluid. Vascular/Lymphatic: Mild for age atherosclerosis. Major arterial structures are patent. Portal venous system appears patent. Stable lymph nodes. No lymphadenopathy. Reproductive: Negative. Other: No pelvic free fluid. Musculoskeletal: Stable.  No acute osseous abnormality identified. IMPRESSION: 1. The position of the left abdominal percutaneous pigtail catheter appears stable since October. No residual abscess but continued and perhaps progressive circumferential colonic wall thickening and mesenteric inflammation near the catheter. 2. Increased low-density stool distending the upstream left colon and splenic flexure raising the possibility of partial obstruction. 3. Consider Colonoscopy: The persistent colonic wall thickening in #1 may be inflammatory but colonic tumor is difficult to exclude. 4. New since October small to moderate size loculated left pleural effusion. Associated lung base atelectasis without definite pneumonia. Electronically Signed   By: Genevie Ann M.D.   On: 11/08/2017 15:30   Ir Catheter Tube Change  Result Date: 10/28/2017 INDICATION: 82 year old male with a history of left lower quadrant abscess secondary to diverticular disease. Drain placed 09/16/2017. The patient has had feculent drainage, with fluoro guided injection demonstrating fistula to the colon. Current drain has become occluded. EXAM: IR CATHETER TUBE CHANGE MEDICATIONS: None ANESTHESIA/SEDATION: None COMPLICATIONS: None PROCEDURE: Informed written consent was obtained from the patient after a thorough discussion of the procedural risks, benefits and alternatives. All questions were addressed. Maximal Sterile Barrier Technique was  utilized including caps, mask, sterile gowns, sterile gloves, sterile drape, hand hygiene and skin antiseptic. A timeout was performed prior to the initiation of the procedure. Patient positioned supine position on the fluoroscopy table. The left lower quadrant and drain were prepped and draped in the usual sterile fashion. 1% lidocaine was used for local anesthesia. Attempted drain flush proved blockage of the tube. Small amount contrast entered the cavity, confirming persisting fistula. Modified Seldinger technique was used to place a new 14 [place] drain. Drain was sutured in position. Drain attached to gravity drainage. Patient tolerated the procedure well and remained hemodynamically stable throughout. No complications were encountered and no significant blood loss. IMPRESSION: Status post upsized and exchange for a new 14 French drain into a abscess cavity of the left lower quadrant. Injection confirms persisting fistula to the colon. Signed, Dulcy Fanny. Earleen Newport, DO [facility] Electronically Signed   By: Corrie Mckusick D.O.   On: 10/28/2017 12:17   US Renal  Result Date: 11/15/2017 CLINICAL DATA:  Acute renal injury, postop exploratory laparotomy. EXAM: RENAL / URINARY TRACT ULTRASOUND COMPLETE COMPARISON:  CT 11/14/2017. FINDINGS: Right Kidney: Length: 12.6 cm. Echogenicity within normal limits. There is an 8 mm calculus in the lower pole. No mass or hydronephrosis visualized. Left Kidney: Length: 11.8 cm. Echogenicity within normal limits. Nonobstructing 8 mm calculus in the left upper pole. No mass or hydronephrosis visualized. Bladder: Decompressed by Foley catheter. Other: Small amount of perihepatic fluid is seen. IMPRESSION: 1. Bilateral nephrolithiasis without obstructive uropathy. Calculi measure on the order of 8 mm each, one seen in the lower the right kidney and the second in the upper pole of left kidney. 2. No loss of cortical-medullary  distinction of either kidney. 3.  Foley decompressed urinary bladder. 4. Small amount of perihepatic free fluid. Electronically Signed   By: Ashley Royalty M.D.   On: 11/15/2017 00:20   Dg Chest Port 1 View  Result Date: 11/20/2017 CLINICAL DATA:  Evaluation after placement of left basilar chest tube to water seal. Original placement of pigtail chest tube on 11/12/2017 to treat loculated basilar pleural effusion. EXAM: PORTABLE CHEST 1 VIEW COMPARISON:  11/17/2017 FINDINGS: Interval extubation and removal of nasogastric tube. Left basilar chest tube shows stable course and positioning. No significant residual pleural effusion. Mild bibasilar atelectasis. No pneumothorax. Stable heart size. IMPRESSION: No pneumothorax. No significant residual left-sided pleural effusion. Electronically Signed   By: Aletta Edouard M.D.   On: 11/20/2017 15:02   Dg Chest Port 1 View  Result Date: 11/17/2017 CLINICAL DATA:  Intubated EXAM: PORTABLE CHEST 1 VIEW COMPARISON:  11/14/2017 FINDINGS: Support devices are stable. Left basilar chest tube remains in place. No pneumothorax. Heart is borderline in size. Left base atelectasis, improving since prior study. IMPRESSION: Stable support devices.  No pneumothorax. Left base atelectasis, improving since prior study. Electronically Signed   By: Rolm Baptise M.D.   On: 11/17/2017 09:01   Portable Chest Xray  Result Date: 11/14/2017 CLINICAL DATA:  Post op bowel resections EXAM: PORTABLE CHEST 1 VIEW COMPARISON:  11/14/2017 FINDINGS: Endotracheal tube tip is about 8.2 cm superior to carina. Esophageal tube tip is below the diaphragm. Left lower chest drainage catheter as before. Small pleural effusions. Mild cardiomegaly. No pneumothorax. Mild atelectasis or focus of infiltrate in the left mid lung. Left lung base consolidation. Left-sided central venous catheter tip overlies the brachiocephalic confluence. No pneumothorax. IMPRESSION: 1. Endotracheal tube tip about 8.2 cm superior  to carina 2. Small left effusion with left lower lobe atelectasis or infiltrate, slight worsening. 3. Mild cardiomegaly Electronically Signed   By: Donavan Foil M.D.   On: 11/14/2017 23:03   Dg Chest Port 1 View  Result Date: 11/14/2017 CLINICAL DATA:  82 year old male with altered mental status. EXAM: PORTABLE CHEST 1 VIEW COMPARISON:  11/14/2017. FINDINGS: Cardiomediastinal silhouette enlarged but stable. There is mild pulmonary vascular congestion without frank edema. No focal parenchymal consolidation. Likely small left pleural effusion and associated atelectasis, superimposed consolidation not excluded. No pneumothorax identified. Pigtail catheter overlies the left upper quadrant. No acute osseous abnormalities. IMPRESSION: Grossly stable appearance of the chest with small left basilar effusion/atelectasis, superimposed consolidation not excluded. Electronically Signed   By: Kristopher Oppenheim M.D.   On: 11/14/2017 13:39   Dg Chest Port 1 View  Result Date: 11/14/2017 CLINICAL DATA:  Left pleural effusion. EXAM: PORTABLE CHEST 1 VIEW COMPARISON:  Radiographs of November 08, 2017. FINDINGS: Stable cardiomediastinal silhouette. No pneumothorax is noted. Right lung is clear. Mild left basilar atelectasis or infiltrate is noted with possible minimal left pleural effusion. Bony thorax is unremarkable. IMPRESSION: Mild left basilar atelectasis or infiltrate is noted with minimal left pleural effusion. Electronically Signed   By: Marijo Conception, M.D.   On: 11/14/2017 07:30   Dg Swallowing Func-speech Pathology  Result Date: 11/19/2017 Objective Swallowing Evaluation: Type of Study: Bedside Swallow Evaluation  Patient Details Name: Belvin Gauss MRN: [id number] Date of Birth: 04-12-1935 Today's Date: 11/19/2017 Time: SLP Start Time (ACUTE ONLY): 1158 -SLP Stop Time (ACUTE ONLY): 2297 SLP Time Calculation (min) (ACUTE ONLY): 16 min Past Medical History: Past Medical History: Diagnosis Date . Anemia  .  Bladder diverticulum 11/10/2017 . Esophageal ulcer   hx/notes 09/15/2017 . Gastric ulcer  hx/notes 09/15/2017 . GI bleed  . History of blood transfusion 07/2017 . NSAID induced gastritis  Past Surgical History: Past Surgical History: Procedure Laterality Date . COLONOSCOPY  ~ 05/2016 . ESOPHAGOGASTRODUODENOSCOPY (EGD) WITH PROPOFOL N/A 07/25/2017  Procedure: ESOPHAGOGASTRODUODENOSCOPY (EGD) WITH PROPOFOL;  Surgeon: Mauri Pole, MD;  Location: [facility];  Service: Endoscopy;  Laterality: N/A; . IR CATHETER TUBE CHANGE  10/28/2017 . IR RADIOLOGIST EVAL & MGMT  09/30/2017 . IR RADIOLOGIST EVAL & MGMT  10/19/2017 . LAPAROTOMY N/A 11/14/2017  Procedure: EXPLORATORY LAPAROTOMY WITH HARTMANN PROCEDURE;  Surgeon: Clovis Riley, MD;  Location: [facility];  Service: General;  Laterality: N/A; HPI: Pt is an 82 year old male with a known peptic ulcer disease, history of GI bleed, diverticulosis who presents with draining around indwelling catheter secondary to diverticulitis with abscess who was brought to the ED with increasing weight loss, GI spasms, cough plus leaking around his diverticular catheter site. Pt now s/p ex lap with partial sigmoid colectomy on 11/25. Of note, pt was intubated from 11/25-11/28.  Subjective: pt alert but not a great historian, provides inconsistent descriptions Assessment / Plan / Recommendation CHL IP CLINICAL IMPRESSIONS 11/19/2017 Clinical Impression -- SLP Visit Diagnosis -- Attention and concentration deficit following -- Frontal lobe and executive function deficit following -- Impact on safety and function (No Data)   CHL IP TREATMENT RECOMMENDATION 11/19/2017 Treatment Recommendations Therapy as outlined in treatment plan below   Prognosis 11/19/2017 Prognosis for Safe Diet Advancement Good Barriers to Reach Goals Cognitive deficits Barriers/Prognosis Comment -- CHL IP DIET RECOMMENDATION 11/19/2017 SLP Diet Recommendations Dysphagia 2 (Fine chop) solids;Thin liquid Liquid  Administration via Cup;No straw Medication Administration Whole meds with puree Compensations Small sips/bites;Minimize environmental distractions;Slow rate;Clear throat intermittently Postural Changes Seated upright at 90 degrees   CHL IP OTHER RECOMMENDATIONS 11/19/2017 Recommended Consults -- Oral Care Recommendations Oral care BID Other Recommendations --   CHL IP FOLLOW UP RECOMMENDATIONS 11/19/2017 Follow up Recommendations (No Data)   CHL IP FREQUENCY AND DURATION 11/19/2017 Speech Therapy Frequency (ACUTE ONLY) min 2x/week Treatment Duration 2 weeks      CHL IP ORAL PHASE 11/19/2017 Oral Phase Impaired Oral - Pudding Teaspoon -- Oral - Pudding Cup -- Oral - Honey Teaspoon -- Oral - Honey Cup -- Oral - Nectar Teaspoon -- Oral - Nectar Cup -- Oral - Nectar Straw -- Oral - Thin Teaspoon -- Oral - Thin Cup Delayed oral transit;Premature spillage Oral - Thin Straw Delayed oral transit Oral - Puree Delayed oral transit Oral - Mech Soft -- Oral - Regular Delayed oral transit Oral - Multi-Consistency -- Oral - Pill -- Oral Phase - Comment --  CHL IP PHARYNGEAL PHASE 11/19/2017 Pharyngeal Phase Impaired Pharyngeal- Pudding Teaspoon -- Pharyngeal -- Pharyngeal- Pudding Cup -- Pharyngeal -- Pharyngeal- Honey Teaspoon -- Pharyngeal -- Pharyngeal- Honey Cup -- Pharyngeal -- Pharyngeal- Nectar Teaspoon -- Pharyngeal -- Pharyngeal- Nectar Cup -- Pharyngeal -- Pharyngeal- Nectar Straw -- Pharyngeal -- Pharyngeal- Thin Teaspoon -- Pharyngeal -- Pharyngeal- Thin Cup Penetration/Aspiration during swallow;Reduced airway/laryngeal closure;Penetration/Aspiration before swallow;Pharyngeal residue - pyriform Pharyngeal Material enters airway, remains ABOVE vocal cords and not ejected out Pharyngeal- Thin Straw Penetration/Aspiration during swallow;Reduced airway/laryngeal closure Pharyngeal Material enters airway, remains ABOVE vocal cords and not ejected out Pharyngeal- Puree WFL Pharyngeal -- Pharyngeal- Mechanical Soft --  Pharyngeal -- Pharyngeal- Regular WFL Pharyngeal -- Pharyngeal- Multi-consistency -- Pharyngeal -- Pharyngeal- Pill -- Pharyngeal -- Pharyngeal Comment --  CHL IP CERVICAL ESOPHAGEAL PHASE 11/19/2017 Cervical Esophageal Phase (No Data) Pudding Teaspoon -- Pudding  Cup -- Honey Teaspoon -- Honey Cup -- Nectar Teaspoon -- Nectar Cup -- Nectar Straw -- Thin Teaspoon -- Thin Cup -- Thin Straw -- Puree -- Mechanical Soft -- Regular -- Multi-consistency -- Pill -- Cervical Esophageal Comment -- No flowsheet data found. Houston Siren 11/19/2017, 1:24 PM              Ct Perc Pleural Drain W/indwell Cath W/img Guide  Result Date: 11/12/2017 INDICATION: 82 year old with a loculated left pleural effusion. Plan for CT-guided chest tube placement. EXAM: CT-GUIDED PLACEMENT OF LEFT CHEST TUBE MEDICATIONS: None ANESTHESIA/SEDATION: Fentanyl 50 mcg IV; Versed 1.0 mg IV Moderate Sedation Time:  24 minutes The patient was continuously monitored during the procedure by the interventional radiology nurse under my direct supervision. COMPLICATIONS: None immediate. PROCEDURE: Informed written consent was obtained from the patient after a thorough discussion of the procedural risks, benefits and alternatives. All questions were addressed. A timeout was performed prior to the initiation of the procedure. Patient was placed prone. CT images through the chest were obtained. Posterior loculated left pleural effusion was targeted. The left side of the back was prepped and draped in sterile fashion. Skin and soft tissues were anesthetized with 1% lidocaine. 18 gauge trocar needle was directed into the pleural space with CT guidance. Clear yellow fluid was aspirated. Stiff Amplatz wire was placed. Tract was dilated to accommodate a 12 [place] multipurpose drain. Drain was advanced over the wire. Clear yellow fluid was aspirated and sent for culture. Catheter was sutured to the skin and attached to a pleural fluid evacuation device.  FINDINGS: Loculated effusion along the posterior left lower chest. IMPRESSION: CT-guided placement of a chest tube within the loculated left pleural effusion. Electronically Signed   By: Markus Daft M.D.   On: 11/12/2017 15:10     Nuala Alpha, DO 11/25/2017, 6:54 AM PGY-1, [facility] pager: [phone], text pages welcome

## 2017-11-25 NOTE — Plan of Care (Signed)
  Progressing SLP Dysphagia Goals Misc Dysphagia Goal 11/25/2017 1009 - Progressing by Colon Flattery B, CCC-SLP Note Pt prefers Dys 2 solids, due to poor/missing dentition.   Completed/Met SLP Dysphagia Goals Patient will utilize recommended strategies Description Patient will utilize recommended strategies during swallow to increase swallowing safety with 11/25/2017 1009 - Completed/Met by Colon Flattery B, CCC-SLP

## 2017-11-25 NOTE — Progress Notes (Signed)
FMTS Attending Daily Note:  S:  Patient sitting up in bed, feels well this AM.  Has half-full Ensure in bed beside him.  No complaints  Exam: Gen:  Alert, cooperative patient who appears stated age in no acute distress.  Vital signs reviewed. Heart:  RRR Lungs:  Clear anterior chest Abd:  Wound vac and colostomy in place.  Nontender.  Good BS Ext:  +2 pitting edema BL ankles  Imp/Plan: 1.  Colonic perforation: - doing very well. - Eating and drinking (though not well due to lack of appetite) and tolerating PO very well - wound vac and colostomy look great - no pain.  Stable from surgical standpoint  2.  Hypoglycemia: - Much better CBG control past 24 hours.  - Continue to encourage to eat - on dysphagia 2 diet  3.  AKI: - GFR has dropped slightly past several days.  - secondary to decreased PO intake.  S/p fluid bolus yesterday.  Better intake yesterday and improved CBGs  4.  Dispo:  - medically ready for placement   Other chronic issues -- he is doing well.  I will sign resident note when completed.    Alveda Reasons, MD 11/25/2017 9:37 AM

## 2017-11-25 NOTE — Progress Notes (Signed)
Physical Therapy Treatment Patient Details Name: Roberto Owen MRN: [id number] DOB: July 05, 1935 Today's Date: 11/25/2017    History of Present Illness Pt is an 82 year old male with a known peptic ulcer disease, history of GI bleed, diverticulosis who presents with draining around indwelling catheter secondary to diverticulitis with abscess who was brought to the ED with increasing weight loss, GI spasms, cough plus leaking around his diverticular catheter site. Pt now s/p ex lap with partial sigmoid colectomy on 11/25. Of note, pt was intubated from 11/25-11/28.    PT Comments    Pt reported feeling weaker and exhausted this afternoon. Min guard for safety during bed mobility with increased time and effort. Pt required min assist to scoot hips to EOB. Min guard for transfer from EOB. Required verbal and tactile cues for erect posture. Ambulated shorter distance today due to increased fatigued. Pt stated that he was becoming weaker due to not having anything to eat all day.  Informed nurse and was able to get pt something to eat at end of session. Current plan remains appropriate due to pt increase in fatigue with limited activity. Continue to work on gait next session.   Follow Up Recommendations  SNF;Supervision/Assistance - 24 hour     Equipment Recommendations  Other (comment)(TBD at next venue)    Recommendations for Other Services       Precautions / Restrictions Precautions Precautions: Fall Precaution Comments: L UQ colostomy, abdominal wound VAC Restrictions Weight Bearing Restrictions: No    Mobility  Bed Mobility Overal bed mobility: Needs Assistance Bed Mobility: Supine to Sit     Supine to sit: HOB elevated;Min guard;Min assist     General bed mobility comments: min guard for safety. Increased time and effort with use of rails to help elevate trunk. min assist to help scoot hips to EOB.  Transfers Overall transfer level: Needs assistance Equipment used: Rolling  walker (2 wheeled) Transfers: Sit to/from Stand Sit to Stand: Min guard;Min assist;Max assist         General transfer comment: min guard - min assist for safety. VC for proper hand placement, power up through BLE and erect posture. Pt quickly fatigued by end of session pt max A+2 to lift bottom out of chair to adjust in recliner  Ambulation/Gait Ambulation/Gait assistance: Min guard;+2 safety/equipment Ambulation Distance (Feet): 25 Feet Assistive device: Rolling walker (2 wheeled) Gait Pattern/deviations: Decreased stride length;Step-to pattern;Step-through pattern;Trunk flexed Gait velocity: decreased   General Gait Details: VC for erect posture, to lift head upright during ambulation and RW proximity.   Stairs            Wheelchair Mobility    Modified Rankin (Stroke Patients Only)       Balance Overall balance assessment: Needs assistance Sitting-balance support: Feet supported;Single extremity supported Sitting balance-Leahy Scale: Fair Sitting balance - Comments: Pt able to sit EOB. VC for erect posture and mod cueing to lift head.    Standing balance support: Bilateral upper extremity supported Standing balance-Leahy Scale: Fair Standing balance comment: pt reliant on BUE support from RW during mobility.                              Cognition Arousal/Alertness: Awake/alert Behavior During Therapy: WFL for tasks assessed/performed Overall Cognitive Status: Within Functional Limits for tasks assessed  Exercises Total Joint Exercises Towel Squeeze: AROM;Seated;10 reps General Exercises - Lower Extremity Ankle Circles/Pumps: AROM;20 reps;Supine;Both Long Arc Quad: AROM;Seated;10 reps;Both Hip Flexion/Marching: AROM;10 reps;Both;Seated Toe Raises: Both;AROM;Seated;20 reps Heel Raises: AROM;20 reps;Both;Seated    General Comments        Pertinent Vitals/Pain Pain Assessment: No/denies  pain    Home Living                      Prior Function            PT Goals (current goals can now be found in the care plan section) Acute Rehab PT Goals Patient Stated Goal: not discussed Progress towards PT goals: Progressing toward goals    Frequency    Min 3X/week      PT Plan Current plan remains appropriate    Co-evaluation              AM-PAC PT "6 Clicks" Daily Activity  Outcome Measure  Difficulty turning over in bed (including adjusting bedclothes, sheets and blankets)?: Unable Difficulty moving from lying on back to sitting on the side of the bed? : A Little Difficulty sitting down on and standing up from a chair with arms (e.g., wheelchair, bedside commode, etc,.)?: Unable Help needed moving to and from a bed to chair (including a wheelchair)?: A Lot Help needed walking in hospital room?: A Lot Help needed climbing 3-5 steps with a railing? : Total 6 Click Score: 10    End of Session Equipment Utilized During Treatment: Gait belt Activity Tolerance: Patient tolerated treatment well Patient left: in chair;with chair alarm set;with call bell/phone within reach Nurse Communication: Mobility status;Other (comment) PT Visit Diagnosis: Unsteadiness on feet (R26.81);Muscle weakness (generalized) (M62.81)     Time: 1740-8144 PT Time Calculation (min) (ACUTE ONLY): 35 min  Charges:  $Gait Training: 8-22 mins $Therapeutic Activity: 8-22 mins                    G Codes:  Functional Assessment Tool Used: AM-PAC 6 Clicks Basic Mobility    Fransisca Connors, SPTA    Fransisca Connors 11/25/2017, 4:30 PM

## 2017-11-26 DIAGNOSIS — Z85038 Personal history of other malignant neoplasm of large intestine: Secondary | ICD-10-CM | POA: Diagnosis not present

## 2017-11-26 DIAGNOSIS — K631 Perforation of intestine (nontraumatic): Secondary | ICD-10-CM | POA: Diagnosis not present

## 2017-11-26 DIAGNOSIS — K219 Gastro-esophageal reflux disease without esophagitis: Secondary | ICD-10-CM | POA: Diagnosis not present

## 2017-11-26 DIAGNOSIS — J90 Pleural effusion, not elsewhere classified: Secondary | ICD-10-CM | POA: Diagnosis not present

## 2017-11-26 DIAGNOSIS — Z23 Encounter for immunization: Secondary | ICD-10-CM | POA: Diagnosis not present

## 2017-11-26 DIAGNOSIS — K5701 Diverticulitis of small intestine with perforation and abscess with bleeding: Secondary | ICD-10-CM | POA: Diagnosis not present

## 2017-11-26 DIAGNOSIS — E8770 Fluid overload, unspecified: Secondary | ICD-10-CM | POA: Diagnosis not present

## 2017-11-26 DIAGNOSIS — S3092XA Unspecified superficial injury of abdominal wall, initial encounter: Secondary | ICD-10-CM | POA: Diagnosis not present

## 2017-11-26 DIAGNOSIS — Z8619 Personal history of other infectious and parasitic diseases: Secondary | ICD-10-CM | POA: Diagnosis not present

## 2017-11-26 DIAGNOSIS — I10 Essential (primary) hypertension: Secondary | ICD-10-CM | POA: Diagnosis not present

## 2017-11-26 DIAGNOSIS — R112 Nausea with vomiting, unspecified: Secondary | ICD-10-CM | POA: Diagnosis not present

## 2017-11-26 DIAGNOSIS — R42 Dizziness and giddiness: Secondary | ICD-10-CM | POA: Diagnosis not present

## 2017-11-26 DIAGNOSIS — L97429 Non-pressure chronic ulcer of left heel and midfoot with unspecified severity: Secondary | ICD-10-CM | POA: Diagnosis not present

## 2017-11-26 DIAGNOSIS — M6281 Muscle weakness (generalized): Secondary | ICD-10-CM | POA: Diagnosis not present

## 2017-11-26 DIAGNOSIS — R066 Hiccough: Secondary | ICD-10-CM | POA: Diagnosis not present

## 2017-11-26 DIAGNOSIS — I4891 Unspecified atrial fibrillation: Secondary | ICD-10-CM | POA: Diagnosis not present

## 2017-11-26 DIAGNOSIS — L98499 Non-pressure chronic ulcer of skin of other sites with unspecified severity: Secondary | ICD-10-CM | POA: Diagnosis not present

## 2017-11-26 DIAGNOSIS — R100 Acute abdomen: Secondary | ICD-10-CM | POA: Diagnosis not present

## 2017-11-26 DIAGNOSIS — K56609 Unspecified intestinal obstruction, unspecified as to partial versus complete obstruction: Secondary | ICD-10-CM | POA: Diagnosis not present

## 2017-11-26 DIAGNOSIS — R609 Edema, unspecified: Secondary | ICD-10-CM | POA: Diagnosis not present

## 2017-11-26 DIAGNOSIS — I480 Paroxysmal atrial fibrillation: Secondary | ICD-10-CM | POA: Diagnosis not present

## 2017-11-26 DIAGNOSIS — I509 Heart failure, unspecified: Secondary | ICD-10-CM | POA: Diagnosis not present

## 2017-11-26 DIAGNOSIS — R600 Localized edema: Secondary | ICD-10-CM | POA: Diagnosis not present

## 2017-11-26 DIAGNOSIS — R5382 Chronic fatigue, unspecified: Secondary | ICD-10-CM | POA: Diagnosis not present

## 2017-11-26 DIAGNOSIS — D011 Carcinoma in situ of rectosigmoid junction: Secondary | ICD-10-CM | POA: Diagnosis not present

## 2017-11-26 DIAGNOSIS — R1312 Dysphagia, oropharyngeal phase: Secondary | ICD-10-CM | POA: Diagnosis not present

## 2017-11-26 LAB — GLUCOSE, CAPILLARY
GLUCOSE-CAPILLARY: 85 mg/dL (ref 65–99)
GLUCOSE-CAPILLARY: 91 mg/dL (ref 65–99)
Glucose-Capillary: 90 mg/dL (ref 65–99)
Glucose-Capillary: 76 mg/dL (ref 65–99)

## 2017-11-26 MED ORDER — AMIODARONE HCL 200 MG PO TABS: 200.0000 mg | ORAL_TABLET | Freq: Every day | ORAL | 0 refills | Status: DC

## 2017-11-26 MED ORDER — METOPROLOL TARTRATE 25 MG PO TABS
12.5000 mg | ORAL_TABLET | Freq: Two times a day (BID) | ORAL | Status: DC
Start: 2017-11-26 — End: 2018-04-21

## 2017-11-26 MED ORDER — RAMELTEON 8 MG PO TABS: 8.0000 mg | ORAL_TABLET | Freq: Every day | ORAL | Status: DC

## 2017-11-26 MED ORDER — APIXABAN 2.5 MG PO TABS
2.5000 mg | ORAL_TABLET | Freq: Two times a day (BID) | ORAL | 0 refills | Status: DC
Start: 2017-11-26 — End: 2018-01-26

## 2017-11-26 NOTE — Progress Notes (Signed)
Family Medicine Teaching Service Daily Progress Note Intern Pager: [phone]  Patient name: Roberto Owen Medical record number: [id number] Date of birth: 04-01-1935 Age: 82 y.o. Gender: male  Primary Care Provider: Patient, No Pcp Per Consultants: Surgery, IR, Cardiology, Oncology  Code Status: Full   Pt Overview and Major Events to Date:  11/19 - admitted for GI spasms, LOA 11/22 - allowed to eat liquid diet per Surgery 11/23 - Left chest tube placement by IR 11/25 - perforated diverticuli requiring emergent colectomy 11/26 - found to have T4N1 colorectal carcinoma  Assessment and Plan: Roberto Owen a 82 y.o.malepresenting with an in place abdominal drain due to previous episode of diverticulitis with abscess of the sigmoid colon, developed bowel perforation s/p ex-lap and coloectomy with colostomy placed and found to have colorectal carcinoma. PMH is significant forPeptic ulcer disease, history of GI bleedand previous episode of diverticulosis.  Perforated viscus s/p ex lap and colostomy (Hartmann's Procedure):Stable He is POD#12. He was given Vanc and Zosyn for a total of 14 days and then it was discontinued. See previous notes for details. He is medically stable for discharge to SNF. - Surgery following, surgically stable for placement at SNF - tylenol PRN - zofran PRN - dysphagia diet - patient will go to SNF per PT/OT recs - will call daughter today (12/7) as she and patient have been discussing SNF placement  Colorectal Carcinoma: Further management by oncology outpatient Patient was found to have colorectal carcinoma after ex lap and colostomy. Hematology and GI oncology on board. Pathology showed T4N1 perforated adenocarcinoma. - oncology following, follow up appointment with Dr. Benay Owen on 12/10 @ 2pm  - Hematology also following  Loculated Left Pleural Effusion: Resolved Chest tube removed per IR on 12/2. CXR 12/1 stable with no significant remaining  pleural effusion - Vanc/Zosyn for HCAP coverage overlaps with coverage from ex-lap and colectomy with colostomy. Abx course completed as of 12/4. - Continuous pulse ox - IR has signed off  Hypoglycemia: Resolved Patient had two episodes on 12/5 (41 and 47) but continues to endorse he has no symptoms. He was given several cups of orange juice and we have encouraged him to eat more. Patient has had persistently low glucose readings and is likely due to poor appetite. No sugar lowering medications on board. - discontinued CBGs; encourage patient to eat - per nutrition Enlive to supplement caloric needs  New Onset Afib: Resolved. Patient has spontaneously converted back to normal rhythm, rate has been relatively well controlled averaging 65-70s. He did have one episode on tachycardia in past 24 hours. stable on oral amiodarone. Regualr rhythm today, rate is well controlled. - Cardiology has signed off and will follow up outpatient.  - continue amio 200mg  daily - continue Metoprolol 12.5 BID - restarted Eliquis after consulting with Hematology  Anemia Hemoglobin stable at 9.3 on 11/20/17, BL ~ 9.0 - per onc recs, will give IV feraheme during hospitalization and d/c w/ oral iron  Hypoalbuminemia due to Severe protein-calorie malnutrition (HCC) - Per Nutrition cont 41ml Prostat TID.  - cont dysphagia diet  AKI/CKD IV Appears to be in Stage 4 CKD per labs. Scr of 2.65 down from 2.73 yesterday and has stabilized. Baseline ~0.8. GFR stable. Renal U/S showed bilateral non-obstructive nephrolithasis. Calculi measure on the order of 8 mm each, one seen in the lower the right kidney and the second in the upper pole of left kidney. Suspect his continual rise in Cr is due to poor oral hydration. - I/Os: total of  480 in. Total Output 945 but urine outpt only recorded 235mL.  Hypokalemia: Resolved. Patient is eating and tolerating PO intake. No need for further management as patient is likely going to  SNF today or tomorrow. - consider BMP only if symptomatic.  Small Subdural Hematoma: stable CT Head on right convexity 3.9 mm subdural hematoma without change on 11/26. MRI also on 11/27 showed no acute changes, stable 3-41mm right cerebral convexity subdural hematoma. D/w neurosurgery who felt hematomas were stable and it was okay to restart anticoagulation -Continue heparin ppx for now -Hematology recommended starting elliquis   FEN/GI:Protonix, dysphagia 2 diet Prophylaxis:Heparin  Disposition: SNF  Subjective:  Patient today states he feels good and has no complaints. He states he still has some "stinging pain" in his left side of his lower abdomen only when he coughs. This has been present since surgery and per patient is not getting worse.   He denies chest pain, SOB, difficulty breathing, nausea, vomiting, or difficulty urinating  Objective: Temp:  [97.8 F (36.6 C)-98.5 F (36.9 C)] 98.2 F (36.8 C) (12/07 4431) Pulse Rate:  [65-139] 65 (12/07 0611) Resp:  [18] 18 (12/07 0611) BP: (131-138)/(55-63) 138/57 (12/07 0611) SpO2:  [96 %-100 %] 100 % (12/07 0611) Weight:  [226 lb 6.6 oz (102.7 kg)] 226 lb 6.6 oz (102.7 kg) (12/07 5400) Physical Exam: Gen: Alert and Oriented x 3, NAD HEENT: Normocephalic, atraumatic, PERRLA, EOMI CV: RRR, no murmurs, normal S1, S2 split, +2 pulses dorsalis pedis bilaterally Resp: CTAB, no wheezing, rales, or rhonchi, comfortable work of breathing Abd: non-distended, non-tender, soft, +bs in all four quadrants; colostomy bag in place, midline abdominal incision non-erythematous without drainage, wound vac in place suctioning well MSK: FROM in all four extremities Ext: no clubbing, cyanosis, +1 edema bilateral in LE Skin: warm, dry, intact, no rashes  Laboratory: Recent Labs  Lab 11/20/17 0423 11/22/17 0556  WBC 8.8 9.6  HGB 9.3* 8.5*  HCT 29.2* 26.6*  PLT 298 355   Recent Labs  Lab 11/20/17 0423  11/23/17 0510 11/23/17 1835  11/25/17 0931  NA 144   < > 139 137 139  K 4.0   < > 4.3 4.1 4.4  CL 122*   < > 111 112* 115*  CO2 17*   < > 20* 18* 18*  BUN 22*   < > 20 22* 22*  CREATININE 2.04*   < > 2.46* 2.73* 2.65*  CALCIUM 7.7*   < > 7.9* 7.8* 8.0*  PROT 5.9*  --   --   --   --   BILITOT 0.4  --   --   --   --   ALKPHOS 51  --   --   --   --   ALT 12*  --   --   --   --   AST 17  --   --   --   --   GLUCOSE 79   < > 82 168* 86   < > = values in this interval not displayed.     Imaging/Diagnostic Tests: Ct Abdomen Pelvis Wo Contrast  Result Date: 11/14/2017 CLINICAL DATA:  82 year old male presenting with signs and symptoms suspicious for potential bowel obstruction. EXAM: CT ABDOMEN AND PELVIS WITHOUT CONTRAST TECHNIQUE: Multidetector CT imaging of the abdomen and pelvis was performed following the standard protocol without IV contrast. COMPARISON:  CT the abdomen and pelvis 11/08/2017. FINDINGS: Lower chest: Pigtail drainage catheter in the lower left hemithorax posteriorly. Small left-sided pleural effusion  lying dependently. Trace right pleural effusion lying dependently. Areas of apparent subsegmental atelectasis in the lower lobes of lungs bilaterally. Hepatobiliary: Numerous tiny calcified granulomas. No other definite suspicious appearing hepatic lesions noted throughout the liver on today's noncontrast CT examination. Amorphous intermediate to high attenuation material lying dependently in the gallbladder, presumably biliary sludge. Pancreas: No definite pancreatic mass noted on today's noncontrast CT examination. Small amount of peripancreatic stranding noted, but nonspecific given generalize soft tissues trending throughout the retroperitoneum, mesenteric and diffuse body wall edema. Spleen: Unremarkable. Adrenals/Urinary Tract: Several nonobstructive calculi are noted within the collecting systems of both kidneys, measuring up to 8 mm in the lower pole collecting system of the right kidney. No ureteral  stones. No hydroureteronephrosis. Bilateral adrenal glands are normal in appearance. Large right-sided bladder diverticulum with some high attenuation material lying dependently in this diverticulum, new compared to the prior examination from 11/08/2017, potentially residual contrast material or proteinaceous/hemorrhagic debris. Stomach/Bowel: Unenhanced appearance of the stomach is unremarkable. No pathologic dilatation of small bowel. Colon appears moderately distended with multiple air-fluid levels. Previously noted diverticular abscess with indwelling pigtail drainage catheter in the distal descending colon (axial image 57 of series 3) remains completely decompressed. Soft tissue stranding adjacent to the decompressed abscess is noted, as well as adjacent locules of extraluminal gas (axial image 58 of series 3) which are new compared to the prior study. The appendix is not confidently identified and may be surgically absent. Regardless, there are no inflammatory changes noted adjacent to the cecum to suggest the presence of an acute appendicitis at this time. Vascular/Lymphatic: Aortic atherosclerosis, without evidence of aneurysm in the abdominal or pelvic vasculature. No lymphadenopathy noted in the abdomen or pelvis on today's noncontrast CT examination. Reproductive: Prostate gland and seminal vesicles are incompletely imaged. Other: New pneumoperitoneum (moderate volume) noted. Trace volume of ascites. Diffuse mesenteric and retroperitoneal edema. Musculoskeletal: Diffuse body wall edema. There are no aggressive appearing lytic or blastic lesions noted in the visualized portions of the skeleton. IMPRESSION: 1. New pneumoperitoneum compared to the prior examination, compatible with perforated viscus. The exact source of this is uncertain, but this is suspected be related to gas traversing the chronic diverticular abscess wall at the site of the indwelling catheter. This was discussed by phone with Dr.  Romana Juniper. 2. Diffuse body wall edema, mesenteric edema and retroperitoneal edema with bilateral pleural effusions, suspicious for a state of anasarca. 3. Multiple nonobstructive calculi in the collecting systems of both kidneys measuring up to 8 mm in the lower pole collecting system of the right kidney. There is also some new high attenuation material lying dependently in the large right bladder diverticulum. This could represent multiple tiny calculi, but this is unlikely given the development compared to the recent prior examination. Rather, this is favored to represent some proteinaceous/hemorrhagic debris or residual contrast material from prior CT scan 11/08/2017. 4. Aortic atherosclerosis. 5. Additional findings, as above. Aortic Atherosclerosis (ICD10-I70.0). These results were called by telephone at the time of interpretation on 11/14/2017 at 3:35 pm to Dr. Kae Heller, who verbally acknowledged these results. Electronically Signed   By: Vinnie Langton M.D.   On: 11/14/2017 15:48   Dg Chest 2 View  Result Date: 11/08/2017 CLINICAL DATA:  Pleural effusion. EXAM: CHEST  2 VIEW COMPARISON:  Radiographs of March 25, 2012. FINDINGS: Stable cardiomediastinal silhouette. No pneumothorax is noted. Right lung is clear. Mild left basilar atelectasis or infiltrate is noted. Bony thorax is unremarkable. IMPRESSION: Mild left basilar subsegmental atelectasis or  infiltrate is noted. Followup PA and lateral chest X-ray is recommended in 3-4 weeks following trial of antibiotic therapy to ensure resolution and exclude underlying malignancy. Electronically Signed   By: Marijo Conception, M.D.   On: 11/08/2017 16:46   Ct Head Wo Contrast  Result Date: 11/15/2017 CLINICAL DATA:  82 year old male post abdominal surgery yesterday. Altered level of consciousness. Not waking up. Insert sub EXAM: CT HEAD WITHOUT CONTRAST TECHNIQUE: Contiguous axial images were obtained from the base of the skull through the vertex without  intravenous contrast. COMPARISON:  11/14/2017 CT. FINDINGS: Brain: Right convexity 3.9 mm subdural hematoma without change. No significant associated mass effect. No new intracranial hemorrhage seen separate from the above described finding. No CT evidence of large acute infarct or diffuse anoxia. Atrophy. No intracranial mass lesion noted on this unenhanced exam. Vascular: Vascular calcifications Skull: No acute abnormality Sinuses/Orbits: No acute orbital abnormality. Minimal mucosal thickening ethmoid sinus air cells. Other: Mastoid air cells and middle ear cavities are clear. IMPRESSION: Right convexity 3.9 mm subdural hematoma without change. No significant associated mass effect. No new intracranial hemorrhage. No CT evidence of large acute infarct or diffuse anoxia. Atrophy. Electronically Signed   By: Genia Del M.D.   On: 11/15/2017 14:04   Ct Head Wo Contrast  Result Date: 11/14/2017 CLINICAL DATA:  82 y/o  M; altered mental status, rule out stroke. EXAM: CT HEAD WITHOUT CONTRAST TECHNIQUE: Contiguous axial images were obtained from the base of the skull through the vertex without intravenous contrast. COMPARISON:  None. FINDINGS: Brain: No evidence of acute infarction, hemorrhage, hydrocephalus or mass lesion/mass effect. Mild chronic microvascular ischemic changes and parenchymal volume loss of the brain for age. 4 mm trace subdural collection in the right cerebral convexity with intermittent attenuation, probably subdural hematoma (series 10, image 39). No significant mass effect. Vascular: Calcific atherosclerosis of carotid siphons. No hyperdense vessel. Skull: Normal. Negative for fracture or focal lesion. Sinuses/Orbits: Mild ethmoid sinus mucosal thickening. Otherwise negative. Other: None. IMPRESSION: 1. Thin subdural collection over right cerebral convexity, likely subdural hematoma. No significant mass effect. 2. No evidence for stroke, brain parenchymal hemorrhage, or mass effect. 3.  Mild chronic microvascular ischemic changes and parenchymal volume loss of the brain. Critical Value/emergent results were called by telephone at the time of interpretation on 11/14/2017 at 3:34 pm to Dr. Kae Heller, who verbally acknowledged these results. Electronically Signed   By: Kristine Garbe M.D.   On: 11/14/2017 15:31   Ct Chest W Contrast  Result Date: 11/10/2017 CLINICAL DATA:  Pleural effusions. EXAM: CT CHEST WITH CONTRAST TECHNIQUE: Multidetector CT imaging of the chest was performed during intravenous contrast administration. CONTRAST:  14mL ISOVUE-300 IOPAMIDOL (ISOVUE-300) INJECTION 61% COMPARISON:  Two-view chest x-ray 11/08/2017 FINDINGS: Cardiovascular: The heart is enlarged. Coronary artery calcifications are present. Pulmonary artery is enlarged. The main pulmonary outflow tract measures 3.9 cm. The aorta is normal in size. Mediastinum/Nodes: Hyperdense material is present within the left hilum. This is greater than expected for calcification appears be some sort of embolic material. Lungs/Pleura: A loculated left lower lobe fusion is present. There is a large portion posteriorly inferiorly. A second loculated portion along the left major fissure. Right-sided pleural fluid is less certain to be loculated. There is significant volume loss in the left greater than right lower lobes. The upper lung fields are clear. Upper Abdomen: Unremarkable. 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 Vertebral body heights are otherwise  normal. Rightward curvature of the upper thoracic spine is associated with the butterfly vertebral body. No focal lytic or blastic lesions are present. IMPRESSION: 1. Loculated bilateral pleural effusions, left greater than right. 2. Associated airspace disease likely reflects atelectasis. 3. Additional pleural irregularity on the left may represent infection associated with the fusion. 4.  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 Dextroconvex curvature is associated. Electronically Signed   By: San Morelle M.D.   On: 11/10/2017 15:19   Mr Brain Wo Contrast  Result Date: 11/16/2017 CLINICAL DATA:  82 y/o M; asymmetry limb movement abnormality of uncertain etiology. History of subdural hematoma. EXAM: MRI HEAD WITHOUT CONTRAST TECHNIQUE: Multiplanar, multiecho pulse sequences of the brain and surrounding structures were obtained without intravenous contrast. COMPARISON:  11/15/2017 CT head. FINDINGS: Brain: No reduced diffusion to suggest acute or early subacute infarction. Intermediate T1 and high T2 signal acute 3-4 mm subdural hematoma over right cerebral convexity is stable. No significant mass effect. No new intracranial hemorrhage. Mild chronic microvascular ischemic changes of white matter and parenchymal volume loss of the brain. 4 mm right lateral ventricle nodule along the under belly of corpus callosum with increased T2 and intermediate T1 signal (series 11, image 17 and series 10, image 17). Vascular: Normal flow voids. Skull and upper cervical spine: Normal marrow signal. Sinuses/Orbits: Negative. Other: None. IMPRESSION: 1. No new acute intracranial abnormality. 2. Stable 3-4 mm right cerebral convexity subdural hematoma. 3. Mild chronic microvascular ischemic changes and mild parenchymal volume loss of the brain. 4. 4 mm right lateral ventricle nodule on under belly of corpus callosum, likely benign. Comparison with prior MRI imaging where available is recommended. Otherwise if clinically indicated consider 6-12 month follow-up to evaluate for stability. Electronically Signed   By: Kristine Garbe M.D.   On: 11/16/2017 14:53   Ct Abdomen Pelvis W Contrast  Result Date: 11/08/2017 CLINICAL DATA:  82 year old male. Left lower quadrant abscess secondary to diverticular disease in September with percutaneous drain placement. Subsequent  feculent drainage, with fluoro guided injection demonstrating fistula to the colon. Drain occlusion on 10/28/2017. Up size an exchange for a new 14 [place] drain at that time. 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 Recent decreased drainage volume from catheter. Loss of appetite for 2 days with vomiting. EXAM: CT ABDOMEN AND PELVIS WITH CONTRAST TECHNIQUE: Multidetector CT imaging of the abdomen and pelvis was performed using the standard protocol following bolus administration of intravenous contrast. CONTRAST:  168mL ISOVUE-300 IOPAMIDOL (ISOVUE-300) INJECTION 61% COMPARISON:  Drain replacement images [id number]. CT Abdomen and Pelvis 09/30/2017. FINDINGS: Lower chest: New loculated appearing small to moderate size left pleural effusion with simple fluid density (series 3, image 6). Associated left lung base compressive atelectasis. No definite lower lobe or lingula consolidation. No pericardial effusion.  Stable and negative right lung base. Hepatobiliary: Stable and negative, scattered punctate calcified granulomas. Pancreas: Negative. Spleen: Negative. Adrenals/Urinary Tract: Normal adrenal glands. Bilateral renal enhancement and contrast excretion is symmetric and within normal limits. No hydronephrosis or hydroureter. 5 cm posterior right urinary bladder diverticulum appears unchanged. Stable and negative urinary bladder otherwise. Stomach/Bowel: Stable and negative rectum. Redundant sigmoid colon with persistent circumferential wall thickening in the proximal sigmoid just distal to the left abdominal percutaneous pigtail drain site (series 3, image 56). Adjacent left gutter/mesenteric stranding or trace fluid has not resolved since October and may be mildly increased (series 3, image 58). No extraluminal gas identified. The distal sigmoid appears stable and negative. The descending  colon demonstrates increased distention with low-density stool to the splenic flexure. The lumen of the descending colon  becomes narrow at the level of the pigtail drain and sigmoid wall thickening. See coronal images 57 52 through 58. Distal descending colon wall thickening. Redundant transverse colon. No retained stool at the splenic flexure. Small volume of fluid in the right colon. Respiratory motion artifact in the right lower quadrant. The cecum is on a lax mesentery. The appendix is not identified today. Nondilated distal small bowel. No dilated small bowel loops. Decompressed stomach and duodenum. No other abdominal free fluid. Vascular/Lymphatic: Mild for age atherosclerosis. Major arterial structures are patent. Portal venous system appears patent. Stable lymph nodes. No lymphadenopathy. Reproductive: Negative. Other: No pelvic free fluid. Musculoskeletal: Stable.  No acute osseous abnormality identified. IMPRESSION: 1. The position of the left abdominal percutaneous pigtail catheter appears stable since October. No residual abscess but continued and perhaps progressive circumferential colonic wall thickening and mesenteric inflammation near the catheter. 2. Increased low-density stool distending the upstream left colon and splenic flexure raising the possibility of partial obstruction. 3. Consider Colonoscopy: The persistent colonic wall thickening in #1 may be inflammatory but colonic tumor is difficult to exclude. 4. New since October small to moderate size loculated left pleural effusion. Associated lung base atelectasis without definite pneumonia. Electronically Signed   By: Genevie Ann M.D.   On: 11/08/2017 15:30   Ir Catheter Tube Change  Result Date: 10/28/2017 INDICATION: 82 year old male with a history of left lower quadrant abscess secondary to diverticular disease. Drain placed 09/16/2017. The patient has had feculent drainage, with fluoro guided injection demonstrating fistula to the colon. Current drain has become occluded. EXAM: IR CATHETER TUBE CHANGE MEDICATIONS: None ANESTHESIA/SEDATION: None COMPLICATIONS:  None PROCEDURE: Informed written consent was obtained from the patient after a thorough discussion of the procedural risks, benefits and alternatives. All questions were addressed. Maximal Sterile Barrier Technique was utilized including caps, mask, sterile gowns, sterile gloves, sterile drape, hand hygiene and skin antiseptic. A timeout was performed prior to the initiation of the procedure. Patient positioned supine position on the fluoroscopy table. The left lower quadrant and drain were prepped and draped in the usual sterile fashion. 1% lidocaine was used for local anesthesia. Attempted drain flush proved blockage of the tube. Small amount contrast entered the cavity, confirming persisting fistula. Modified Seldinger technique was used to place a new 14 [place] drain. Drain was sutured in position. Drain attached to gravity drainage. Patient tolerated the procedure well and remained hemodynamically stable throughout. No complications were encountered and no significant blood loss. IMPRESSION: Status post upsized and exchange for a new 14 French drain into a abscess cavity of the left lower quadrant. Injection confirms persisting fistula to the colon. Signed, Dulcy Fanny. Earleen Newport, DO [facility] Electronically Signed   By: Corrie Mckusick D.O.   On: 10/28/2017 12:17   US Renal  Result Date: 11/15/2017 CLINICAL DATA:  Acute renal injury, postop exploratory laparotomy. EXAM: RENAL / URINARY TRACT ULTRASOUND COMPLETE COMPARISON:  CT 11/14/2017. FINDINGS: Right Kidney: Length: 12.6 cm. Echogenicity within normal limits. There is an 8 mm calculus in the lower pole. No mass or hydronephrosis visualized. Left Kidney: Length: 11.8 cm. Echogenicity within normal limits. Nonobstructing 8 mm calculus in the left upper pole. No mass or hydronephrosis visualized. Bladder: Decompressed by Foley catheter. Other: Small amount of perihepatic fluid is seen. IMPRESSION: 1.  Bilateral nephrolithiasis without obstructive uropathy. Calculi measure on the order of 8  mm each, one seen in the lower the right kidney and the second in the upper pole of left kidney. 2. No loss of cortical-medullary distinction of either kidney. 3. Foley decompressed urinary bladder. 4. Small amount of perihepatic free fluid. Electronically Signed   By: Ashley Royalty M.D.   On: 11/15/2017 00:20   Dg Chest Port 1 View  Result Date: 11/20/2017 CLINICAL DATA:  Evaluation after placement of left basilar chest tube to water seal. Original placement of pigtail chest tube on 11/12/2017 to treat loculated basilar pleural effusion. EXAM: PORTABLE CHEST 1 VIEW COMPARISON:  11/17/2017 FINDINGS: Interval extubation and removal of nasogastric tube. Left basilar chest tube shows stable course and positioning. No significant residual pleural effusion. Mild bibasilar atelectasis. No pneumothorax. Stable heart size. IMPRESSION: No pneumothorax. No significant residual left-sided pleural effusion. Electronically Signed   By: Aletta Edouard M.D.   On: 11/20/2017 15:02   Dg Chest Port 1 View  Result Date: 11/17/2017 CLINICAL DATA:  Intubated EXAM: PORTABLE CHEST 1 VIEW COMPARISON:  11/14/2017 FINDINGS: Support devices are stable. Left basilar chest tube remains in place. No pneumothorax. Heart is borderline in size. Left base atelectasis, improving since prior study. IMPRESSION: Stable support devices.  No pneumothorax. Left base atelectasis, improving since prior study. Electronically Signed   By: Rolm Baptise M.D.   On: 11/17/2017 09:01   Portable Chest Xray  Result Date: 11/14/2017 CLINICAL DATA:  Post op bowel resections EXAM: PORTABLE CHEST 1 VIEW COMPARISON:  11/14/2017 FINDINGS: Endotracheal tube tip is about 8.2 cm superior to carina. Esophageal tube tip is below the diaphragm. Left lower chest drainage catheter as before. Small pleural effusions. Mild cardiomegaly. No pneumothorax. Mild atelectasis or focus of  infiltrate in the left mid lung. Left lung base consolidation. Left-sided central venous catheter tip overlies the brachiocephalic confluence. No pneumothorax. IMPRESSION: 1. Endotracheal tube tip about 8.2 cm superior to carina 2. Small left effusion with left lower lobe atelectasis or infiltrate, slight worsening. 3. Mild cardiomegaly Electronically Signed   By: Donavan Foil M.D.   On: 11/14/2017 23:03   Dg Chest Port 1 View  Result Date: 11/14/2017 CLINICAL DATA:  82 year old male with altered mental status. EXAM: PORTABLE CHEST 1 VIEW COMPARISON:  11/14/2017. FINDINGS: Cardiomediastinal silhouette enlarged but stable. There is mild pulmonary vascular congestion without frank edema. No focal parenchymal consolidation. Likely small left pleural effusion and associated atelectasis, superimposed consolidation not excluded. No pneumothorax identified. Pigtail catheter overlies the left upper quadrant. No acute osseous abnormalities. IMPRESSION: Grossly stable appearance of the chest with small left basilar effusion/atelectasis, superimposed consolidation not excluded. Electronically Signed   By: Kristopher Oppenheim M.D.   On: 11/14/2017 13:39   Dg Chest Port 1 View  Result Date: 11/14/2017 CLINICAL DATA:  Left pleural effusion. EXAM: PORTABLE CHEST 1 VIEW COMPARISON:  Radiographs of November 08, 2017. FINDINGS: Stable cardiomediastinal silhouette. No pneumothorax is noted. Right lung is clear. Mild left basilar atelectasis or infiltrate is noted with possible minimal left pleural effusion. Bony thorax is unremarkable. IMPRESSION: Mild left basilar atelectasis or infiltrate is noted with minimal left pleural effusion. Electronically Signed   By: Marijo Conception, M.D.   On: 11/14/2017 07:30   Dg Swallowing Func-speech Pathology  Result Date: 11/19/2017 Objective Swallowing Evaluation: Type of Study: Bedside Swallow Evaluation  Patient Details Name: Alphonso Gregson MRN: [id number] Date of Birth: June 07, 1935  Today's Date: 11/19/2017 Time: SLP Start Time (ACUTE ONLY): 2979 -SLP Stop Time (ACUTE ONLY): 8921 SLP Time Calculation (min) (ACUTE  ONLY): 16 min Past Medical History: Past Medical History: Diagnosis Date . Anemia  . Bladder diverticulum 11/10/2017 . Esophageal ulcer   hx/notes 09/15/2017 . Gastric ulcer   hx/notes 09/15/2017 . GI bleed  . History of blood transfusion 07/2017 . NSAID induced gastritis  Past Surgical History: Past Surgical History: Procedure Laterality Date . COLONOSCOPY  ~ 05/2016 . ESOPHAGOGASTRODUODENOSCOPY (EGD) WITH PROPOFOL N/A 07/25/2017  Procedure: ESOPHAGOGASTRODUODENOSCOPY (EGD) WITH PROPOFOL;  Surgeon: Mauri Pole, MD;  Location: [facility];  Service: Endoscopy;  Laterality: N/A; . IR CATHETER TUBE CHANGE  10/28/2017 . IR RADIOLOGIST EVAL & MGMT  09/30/2017 . IR RADIOLOGIST EVAL & MGMT  10/19/2017 . LAPAROTOMY N/A 11/14/2017  Procedure: EXPLORATORY LAPAROTOMY WITH HARTMANN PROCEDURE;  Surgeon: Clovis Riley, MD;  Location: [facility] OR;  Service: General;  Laterality: N/A; HPI: Pt is an 82 year old male with a known peptic ulcer disease, history of GI bleed, diverticulosis who presents with draining around indwelling catheter secondary to diverticulitis with abscess who was brought to the ED with increasing weight loss, GI spasms, cough plus leaking around his diverticular catheter site. Pt now s/p ex lap with partial sigmoid colectomy on 11/25. Of note, pt was intubated from 11/25-11/28.  Subjective: pt alert but not a great historian, provides inconsistent descriptions Assessment / Plan / Recommendation CHL IP CLINICAL IMPRESSIONS 11/19/2017 Clinical Impression -- SLP Visit Diagnosis -- Attention and concentration deficit following -- Frontal lobe and executive function deficit following -- Impact on safety and function (No Data)   CHL IP TREATMENT RECOMMENDATION 11/19/2017 Treatment Recommendations Therapy as outlined in treatment plan below   Prognosis 11/19/2017 Prognosis for Safe  Diet Advancement Good Barriers to Reach Goals Cognitive deficits Barriers/Prognosis Comment -- CHL IP DIET RECOMMENDATION 11/19/2017 SLP Diet Recommendations Dysphagia 2 (Fine chop) solids;Thin liquid Liquid Administration via Cup;No straw Medication Administration Whole meds with puree Compensations Small sips/bites;Minimize environmental distractions;Slow rate;Clear throat intermittently Postural Changes Seated upright at 90 degrees   CHL IP OTHER RECOMMENDATIONS 11/19/2017 Recommended Consults -- Oral Care Recommendations Oral care BID Other Recommendations --   CHL IP FOLLOW UP RECOMMENDATIONS 11/19/2017 Follow up Recommendations (No Data)   CHL IP FREQUENCY AND DURATION 11/19/2017 Speech Therapy Frequency (ACUTE ONLY) min 2x/week Treatment Duration 2 weeks      CHL IP ORAL PHASE 11/19/2017 Oral Phase Impaired Oral - Pudding Teaspoon -- Oral - Pudding Cup -- Oral - Honey Teaspoon -- Oral - Honey Cup -- Oral - Nectar Teaspoon -- Oral - Nectar Cup -- Oral - Nectar Straw -- Oral - Thin Teaspoon -- Oral - Thin Cup Delayed oral transit;Premature spillage Oral - Thin Straw Delayed oral transit Oral - Puree Delayed oral transit Oral - Mech Soft -- Oral - Regular Delayed oral transit Oral - Multi-Consistency -- Oral - Pill -- Oral Phase - Comment --  CHL IP PHARYNGEAL PHASE 11/19/2017 Pharyngeal Phase Impaired Pharyngeal- Pudding Teaspoon -- Pharyngeal -- Pharyngeal- Pudding Cup -- Pharyngeal -- Pharyngeal- Honey Teaspoon -- Pharyngeal -- Pharyngeal- Honey Cup -- Pharyngeal -- Pharyngeal- Nectar Teaspoon -- Pharyngeal -- Pharyngeal- Nectar Cup -- Pharyngeal -- Pharyngeal- Nectar Straw -- Pharyngeal -- Pharyngeal- Thin Teaspoon -- Pharyngeal -- Pharyngeal- Thin Cup Penetration/Aspiration during swallow;Reduced airway/laryngeal closure;Penetration/Aspiration before swallow;Pharyngeal residue - pyriform Pharyngeal Material enters airway, remains ABOVE vocal cords and not ejected out Pharyngeal- Thin Straw  Penetration/Aspiration during swallow;Reduced airway/laryngeal closure Pharyngeal Material enters airway, remains ABOVE vocal cords and not ejected out Pharyngeal- Puree WFL Pharyngeal -- Pharyngeal- Mechanical Soft -- Pharyngeal -- Pharyngeal- Regular WFL Pharyngeal --  Pharyngeal- Multi-consistency -- Pharyngeal -- Pharyngeal- Pill -- Pharyngeal -- Pharyngeal Comment --  CHL IP CERVICAL ESOPHAGEAL PHASE 11/19/2017 Cervical Esophageal Phase (No Data) Pudding Teaspoon -- Pudding Cup -- Honey Teaspoon -- Honey Cup -- Nectar Teaspoon -- Nectar Cup -- Nectar Straw -- Thin Teaspoon -- Thin Cup -- Thin Straw -- Puree -- Mechanical Soft -- Regular -- Multi-consistency -- Pill -- Cervical Esophageal Comment -- No flowsheet data found. Houston Siren 11/19/2017, 1:24 PM              Ct Perc Pleural Drain W/[place] Cath W/img Guide  Result Date: 11/12/2017 INDICATION: 82 year old with a loculated left pleural effusion. Plan for CT-guided chest tube placement. EXAM: CT-GUIDED PLACEMENT OF LEFT CHEST TUBE MEDICATIONS: None ANESTHESIA/SEDATION: Fentanyl 50 mcg IV; Versed 1.0 mg IV Moderate Sedation Time:  24 minutes The patient was continuously monitored during the procedure by the interventional radiology nurse under my direct supervision. COMPLICATIONS: None immediate. PROCEDURE: Informed written consent was obtained from the patient after a thorough discussion of the procedural risks, benefits and alternatives. All questions were addressed. A timeout was performed prior to the initiation of the procedure. Patient was placed prone. CT images through the chest were obtained. Posterior loculated left pleural effusion was targeted. The left side of the back was prepped and draped in sterile fashion. Skin and soft tissues were anesthetized with 1% lidocaine. 18 gauge trocar needle was directed into the pleural space with CT guidance. Clear yellow fluid was aspirated. Stiff Amplatz wire was placed. Tract was dilated to  accommodate a 12 [place] multipurpose drain. Drain was advanced over the wire. Clear yellow fluid was aspirated and sent for culture. Catheter was sutured to the skin and attached to a pleural fluid evacuation device. FINDINGS: Loculated effusion along the posterior left lower chest. IMPRESSION: CT-guided placement of a chest tube within the loculated left pleural effusion. Electronically Signed   By: Markus Daft M.D.   On: 11/12/2017 15:10     Nuala Alpha, DO 11/26/2017, 6:50 AM PGY-1, [place] Intern pager: [phone], text pages welcome

## 2017-11-26 NOTE — Progress Notes (Signed)
12 Days Post-Op   Subjective/Chief Complaint: No new issues    Objective: Vital signs in last 24 hours: Temp:  [97.8 F (36.6 C)-98.5 F (36.9 C)] 98.2 F (36.8 C) (12/07 0611) Pulse Rate:  [65-139] 65 (12/07 0611) Resp:  [18] 18 (12/07 0611) BP: (131-138)/(55-63) 138/57 (12/07 0611) SpO2:  [96 %-100 %] 100 % (12/07 0611) Weight:  [102.7 kg (226 lb 6.6 oz)] 102.7 kg (226 lb 6.6 oz) (12/07 0611) Last BM Date: 11/25/17  Intake/Output from previous day: 12/06 0701 - 12/07 0700 In: 480 [P.O.:480] Out: 945 [Urine:200; Drains:545; Stool:200] Intake/Output this shift: Total I/O In: 120 [P.O.:120] Out: -   Incision/Wound:vac in place  Ostomy functioning and viable   Lab Results:  No results for input(s): WBC, HGB, HCT, PLT in the last 72 hours. BMET Recent Labs    11/23/17 1835 11/25/17 0931  NA 137 139  K 4.1 4.4  CL 112* 115*  CO2 18* 18*  GLUCOSE 168* 86  BUN 22* 22*  CREATININE 2.73* 2.65*  CALCIUM 7.8* 8.0*   PT/INR No results for input(s): LABPROT, INR in the last 72 hours. ABG No results for input(s): PHART, HCO3 in the last 72 hours.  Invalid input(s): PCO2, PO2  Studies/Results: No results found.  Anti-infectives: Anti-infectives (From admission, onward)   Start     Dose/Rate Route Frequency Ordered Stop   11/18/17 1800  vancomycin (VANCOCIN) IVPB 1000 mg/200 mL premix     1,000 mg 200 mL/hr over 60 Minutes Intravenous Every 48 hours 11/17/17 1419 11/22/17 2252   11/15/17 0515  vancomycin (VANCOCIN) 1,250 mg in sodium chloride 0.9 % 250 mL IVPB  Status:  Discontinued     1,250 mg 166.7 mL/hr over 90 Minutes Intravenous Every 24 hours 11/15/17 0502 11/15/17 1746   11/14/17 2315  vancomycin (VANCOCIN) IVPB 750 mg/150 ml premix  Status:  Discontinued     750 mg 150 mL/hr over 60 Minutes Intravenous Every 12 hours 11/14/17 2306 11/14/17 2309   11/13/17 1600  vancomycin (VANCOCIN) IVPB 750 mg/150 ml premix  Status:  Discontinued     750 mg 150 mL/hr  over 60 Minutes Intravenous Every 12 hours 11/13/17 1057 11/14/17 2306   11/11/17 1600  vancomycin (VANCOCIN) IVPB 1000 mg/200 mL premix  Status:  Discontinued     1,000 mg 200 mL/hr over 60 Minutes Intravenous Every 12 hours 11/11/17 0443 11/13/17 1057   11/09/17 1600  vancomycin (VANCOCIN) IVPB 750 mg/150 ml premix  Status:  Discontinued     750 mg 150 mL/hr over 60 Minutes Intravenous Every 12 hours 11/09/17 1500 11/11/17 0443   11/09/17 0800  vancomycin (VANCOCIN) IVPB 750 mg/150 ml premix  Status:  Discontinued     750 mg 150 mL/hr over 60 Minutes Intravenous Every 12 hours 11/08/17 2152 11/09/17 1500   11/09/17 0200  piperacillin-tazobactam (ZOSYN) IVPB 3.375 g  Status:  Discontinued     3.375 g 12.5 mL/hr over 240 Minutes Intravenous Every 8 hours 11/08/17 1821 11/22/17 2359   11/08/17 2200  vancomycin (VANCOCIN) 2,000 mg in sodium chloride 0.9 % 500 mL IVPB     2,000 mg 250 mL/hr over 120 Minutes Intravenous  Once 11/08/17 2152 11/09/17 0020   11/08/17 1830  piperacillin-tazobactam (ZOSYN) IVPB 3.375 g     3.375 g 100 mL/hr over 30 Minutes Intravenous  Once 11/08/17 1821 11/08/17 2134   11/08/17 1745  piperacillin-tazobactam (ZOSYN) IVPB 3.375 g  Status:  Discontinued     3.375 g 100 mL/hr over  30 Minutes Intravenous  Once 11/08/17 1733 11/08/17 1734      Assessment/Plan: s/p Procedure(s): EXPLORATORY LAPAROTOMY WITH HARTMANN PROCEDURE (N/A) Surgically stable  Will sign off  Continue vac and ostomy care  He can ambulate  Follow up with Dr Donne Hazel in 2 - 3 weeks   LOS: 18 days    Joyice Faster Nichlas Pitera 11/26/2017

## 2017-11-26 NOTE — Clinical Social Work Note (Addendum)
CSW had a missed call from patient's daughter at 6:23 pm last night. CSW called back and left a voicemail.  Dayton Scrape, CSW [phone]  8:37 am CSW received call back from patient's daughter. She has chosen [facility]. CSW notified hospital liaison for that facility. She will pursue ordering wound vac.  Dayton Scrape, CSW [phone]  10:14 am Wound vac should be delivered sometime after lunch. CSW will contact hospital liaison for an update around 1:00 pm.  Dayton Scrape, [place] [phone]  11:40 am Received call from hospital liaison. She stated [facility] has an exchange program where patient will discharge to the facility with current wound vac and they will switch it out once he gets to the facility. Transport can be arranged for 4:30 so they can clean the room from previous resident. MD notified.  Dayton Scrape, [place]

## 2017-11-26 NOTE — Progress Notes (Signed)
Called report to [place] at  [facility]. [facility] has been arranged, pt waiting for transportation.

## 2017-11-26 NOTE — Clinical Social Work Placement (Signed)
   CLINICAL SOCIAL WORK PLACEMENT  NOTE  Date:  11/26/2017  Patient Details  Name: Roberto Owen MRN: [id number] Date of Birth: [birth year]  Clinical Social Work is seeking post-discharge placement for this patient at the [place] level of care (*CSW will initial, date and re-position this form in  chart as items are completed):  Yes   Patient/family provided with [facility]'s list of facilities offering this level of care within the geographic area requested by the patient (or if unable, by the patient's family).  Yes   Patient/family informed of their freedom to choose among providers that offer the needed level of care, that participate in Medicare, Medicaid or managed care program needed by the patient, have an available bed and are willing to accept the patient.  Yes   Patient/family informed of [facility]'s ownership interest in [facility] and [facility], as well as of the fact that they are under no obligation to receive care at these facilities.  PASRR submitted to EDS on 11/19/17     PASRR number received on 11/19/17     Existing PASRR number confirmed on       FL2 transmitted to all facilities in geographic area requested by pt/family on 11/21/17     FL2 transmitted to all facilities within larger geographic area on       Patient informed that his/her managed care company has contracts with or will negotiate with certain facilities, including the following:        Yes   Patient/family informed of bed offers received.  Patient chooses bed at [facility]     Physician recommends and patient chooses bed at      Patient to be transferred to [facility] on 11/26/17.  Patient to be transferred to facility by [facility]     Patient family notified on 11/26/17 of transfer.  Name of family member notified:  [name] Zeallous     PHYSICIAN Please prepare prescriptions     Additional Comment:     _______________________________________________ Candie Chroman, LCSW 11/26/2017, 11:50 AM

## 2017-11-26 NOTE — Clinical Social Work Note (Signed)
CSW facilitated patient discharge including contacting patient family and facility to confirm patient discharge plans. Clinical information faxed to facility and family agreeable with plan. CSW arranged ambulance transport via [facility] to [facility] at 4:30 so the facility has time to clean the room. RN to call report prior to discharge [phone]).  CSW will sign off for now as social work intervention is no longer needed. Please consult [place] again if new needs arise.  Dayton Scrape, [place]

## 2017-11-26 NOTE — Progress Notes (Signed)
Wound vac removed for transport to SNF. Wet to dry dressing applied, pt tolerated well.

## 2017-11-29 ENCOUNTER — Ambulatory Visit: Payer: Medicare Other | Admitting: Gastroenterology

## 2017-11-29 ENCOUNTER — Ambulatory Visit: Payer: Medicare Other | Admitting: Oncology

## 2017-12-02 DIAGNOSIS — L98499 Non-pressure chronic ulcer of skin of other sites with unspecified severity: Secondary | ICD-10-CM | POA: Diagnosis not present

## 2017-12-06 DIAGNOSIS — C186 Malignant neoplasm of descending colon: Secondary | ICD-10-CM | POA: Insufficient documentation

## 2017-12-07 ENCOUNTER — Ambulatory Visit: Payer: Medicare Other | Admitting: Hematology

## 2017-12-09 ENCOUNTER — Ambulatory Visit: Payer: Medicare Other | Admitting: Physician Assistant

## 2017-12-09 DIAGNOSIS — L98499 Non-pressure chronic ulcer of skin of other sites with unspecified severity: Secondary | ICD-10-CM | POA: Diagnosis not present

## 2017-12-10 DIAGNOSIS — M6281 Muscle weakness (generalized): Secondary | ICD-10-CM | POA: Diagnosis not present

## 2017-12-10 DIAGNOSIS — R609 Edema, unspecified: Secondary | ICD-10-CM | POA: Diagnosis not present

## 2017-12-10 DIAGNOSIS — R5382 Chronic fatigue, unspecified: Secondary | ICD-10-CM | POA: Diagnosis not present

## 2017-12-10 DIAGNOSIS — I10 Essential (primary) hypertension: Secondary | ICD-10-CM | POA: Diagnosis not present

## 2017-12-10 DIAGNOSIS — R42 Dizziness and giddiness: Secondary | ICD-10-CM | POA: Diagnosis not present

## 2017-12-15 ENCOUNTER — Ambulatory Visit: Payer: Medicare Other | Admitting: Gastroenterology

## 2017-12-16 DIAGNOSIS — R112 Nausea with vomiting, unspecified: Secondary | ICD-10-CM | POA: Diagnosis not present

## 2017-12-16 DIAGNOSIS — M6281 Muscle weakness (generalized): Secondary | ICD-10-CM | POA: Diagnosis not present

## 2017-12-16 DIAGNOSIS — L98499 Non-pressure chronic ulcer of skin of other sites with unspecified severity: Secondary | ICD-10-CM | POA: Diagnosis not present

## 2017-12-16 DIAGNOSIS — R5382 Chronic fatigue, unspecified: Secondary | ICD-10-CM | POA: Diagnosis not present

## 2017-12-23 DIAGNOSIS — L98499 Non-pressure chronic ulcer of skin of other sites with unspecified severity: Secondary | ICD-10-CM | POA: Diagnosis not present

## 2017-12-30 DIAGNOSIS — L98499 Non-pressure chronic ulcer of skin of other sites with unspecified severity: Secondary | ICD-10-CM | POA: Diagnosis not present

## 2017-12-30 DIAGNOSIS — L97429 Non-pressure chronic ulcer of left heel and midfoot with unspecified severity: Secondary | ICD-10-CM | POA: Diagnosis not present

## 2018-01-04 ENCOUNTER — Telehealth: Payer: Self-pay

## 2018-01-04 ENCOUNTER — Encounter: Payer: Self-pay | Admitting: Physician Assistant

## 2018-01-04 ENCOUNTER — Ambulatory Visit (INDEPENDENT_AMBULATORY_CARE_PROVIDER_SITE_OTHER): Payer: Medicare Other | Admitting: Physician Assistant

## 2018-01-04 VITALS — BP 118/78 | HR 70 | Ht 78.0 in | Wt 210.0 lb

## 2018-01-04 DIAGNOSIS — D500 Iron deficiency anemia secondary to blood loss (chronic): Secondary | ICD-10-CM

## 2018-01-04 DIAGNOSIS — Z85038 Personal history of other malignant neoplasm of large intestine: Secondary | ICD-10-CM

## 2018-01-04 DIAGNOSIS — Z8619 Personal history of other infectious and parasitic diseases: Secondary | ICD-10-CM | POA: Diagnosis not present

## 2018-01-04 DIAGNOSIS — K254 Chronic or unspecified gastric ulcer with hemorrhage: Secondary | ICD-10-CM

## 2018-01-04 NOTE — Progress Notes (Signed)
Chief Complaint: History of colorectal cancer and PUD  HPI:    Roberto Owen is a 83 year old African-American male who presents to clinic today for follow-up after his recent diagnosis of colorectal cancer for which he is status post hemicolectomy with colostomy in December.    Per review of chart patient was initially seen by [place] 08/12/17 for iron deficiency anemia and Hemoccult positive stool follow-up.  Inpatient EGD had revealed a nonbleeding ulcer and a history of taking BC powders.  He also had active gastritis with H. pylori per biopsy.  At that time patient had completed Pylera and it was recommended he have an H. pylori stool antigen check for eradication in October.    Patient was then admitted to the hospital 9/18 for abdominal pain and found to have diverticulitis with probable abscess.  IV antibiotics were started and general surgery was consulted.  The patient underwent IR guided drain placement on 09/16/17.  He was discharged home.    The patient then returned to the hospital 10/23/17 for pain around his drain and he was given abx and drain was exchanged.  He then returned to the hospital 11/08/17  and discharged 11/26/17.  Patient initially presented to the ED after his drain had stopped producing output and he had 4 days of loss of appetite, uncontrolled GI spasms and hiccups as well as nausea and vomiting of solid foods.  On admission the patient was found to have a new onset A. fib with RVR.  He was also found to have a left loculated effusion from presumed PNA infection and IR was consulted and a drain was placed.  While receiving oral antibiotics for presumed diverticulitis with abscess the patient became bradycardic and was altered and was found to have a pneumoperitoneum on KUB.  He was taken for emergent surgery and a Hartmann's procedure was performed.  He was given a colostomy and had a colon resection.  He was found to have a T4N1 colorectal carcinoma after pathology was done on the  section of removed bowel after surgery.  He completed a course of vancomycin and Zosyn for 14 days.  His chest tube was removed.  He remained in A. fib and stayed on oral amiodarone.  His chest tube was removed.  He passed a swallow study with speech pathology.  He was discharged to a rehab facility.    Today, the patient presents to clinic accompanied by his granddaughter and tells me "I do not really know why I am here".  Apparently the patient was discharged from the hospital and not given much information regarding follow-up with "anyone".  The patient has been doing very well at his rehab facility and is "almost back to normal".  The patient tells me he has been "breaking all the records at rehab".  The patient is alert and oriented and very "with it" per his words.  Patient tells me today that he is just trying to figure out what he needs to get done in the near future so that he can make a trip back to [place] to "check on my house".  He wants to make sure he follows with the right physicians.  He does have some questions regarding his colostomy and whether or not this is permanent.  He has not followed with surgery since time of discharge.    Per further review of chart it appears this appointment may have been made for follow-up prior to patient's events above.    Patient denies fever,  chills, blood in stool, melena, weight loss, anorexia, nausea, vomiting, heartburn, reflux or symptoms that awaken him at night.   Past Medical History:  Diagnosis Date  . Anemia   . Bladder diverticulum 11/10/2017  . Esophageal ulcer    hx/notes 09/15/2017  . Gastric ulcer    hx/notes 09/15/2017  . GI bleed   . History of blood transfusion 07/2017  . NSAID induced gastritis     Past Surgical History:  Procedure Laterality Date  . COLONOSCOPY  ~ 05/2016  . ESOPHAGOGASTRODUODENOSCOPY (EGD) WITH PROPOFOL N/A 07/25/2017   Procedure: ESOPHAGOGASTRODUODENOSCOPY (EGD) WITH PROPOFOL;  Surgeon: Mauri Pole,  MD;  Location: [facility];  Service: Endoscopy;  Laterality: N/A;  . IR CATHETER TUBE CHANGE  10/28/2017  . IR RADIOLOGIST EVAL & MGMT  09/30/2017  . IR RADIOLOGIST EVAL & MGMT  10/19/2017  . LAPAROTOMY N/A 11/14/2017   Procedure: EXPLORATORY LAPAROTOMY WITH HARTMANN PROCEDURE;  Surgeon: Clovis Riley, MD;  Location: [facility];  Service: General;  Laterality: N/A;    Current Outpatient Medications  Medication Sig Dispense Refill  . acetaminophen (TYLENOL) 500 MG tablet Take 1,000 mg by mouth every 6 (six) hours as needed for mild pain.    Marland Kitchen amiodarone (PACERONE) 200 MG tablet Take 1 tablet (200 mg total) by mouth daily. 30 tablet 0  . apixaban (ELIQUIS) 2.5 MG TABS tablet Take 1 tablet (2.5 mg total) by mouth 2 (two) times daily. 60 tablet 0  . ferrous sulfate 325 (65 FE) MG tablet Take 1 tablet (325 mg total) by mouth daily with breakfast. 30 tablet 5  . metoprolol tartrate (LOPRESSOR) 25 MG tablet Take 0.5 tablets (12.5 mg total) by mouth 2 (two) times daily. 30 tablet   . Multiple Vitamins-Minerals (ADULT ONE DAILY GUMMIES) CHEW Chew 1 tablet by mouth daily.     . pantoprazole (PROTONIX) 40 MG tablet TAKE 1 TABLET BY MOUTH TWICE DAILY 180 tablet 2  . ramelteon (ROZEREM) 8 MG tablet Take 1 tablet (8 mg total) by mouth at bedtime.    . Simethicone 180 MG CAPS Take 1 capsule every 4 (four) hours as needed by mouth (for gas, bloating).    . Sod Bicarb-Ginger-Fennel-Cham (GRIPE WATER) LIQD Take 30 mLs every 4 (four) hours as needed by mouth (for gas).      No current facility-administered medications for this visit.     Allergies as of 01/04/2018  . (No Known Allergies)    Family History  Problem Relation Age of Onset  . Arthritis Mother   . Arthritis Father   . Heart disease Neg Hx   . Kidney disease Neg Hx   . Diabetes Neg Hx     Social History   Socioeconomic History  . Marital status: Divorced    Spouse name: Not on file  . Number of children: Not on file  . Years of  education: Not on file  . Highest education level: Not on file  Social Needs  . Financial resource strain: Not on file  . Food insecurity - worry: Not on file  . Food insecurity - inability: Not on file  . Transportation needs - medical: Not on file  . Transportation needs - non-medical: Not on file  Occupational History  . Not on file  Tobacco Use  . Smoking status: Never Smoker  . Smokeless tobacco: Never Used  Substance and Sexual Activity  . Alcohol use: No  . Drug use: No  . Sexual activity: Not on file  Other  Topics Concern  . Not on file  Social History Narrative  . Not on file    Review of Systems:    Constitutional: No weight loss, fever or chills Skin: No rash  Cardiovascular: No chest pain Respiratory: No SOB  Gastrointestinal: See HPI and otherwise negative Genitourinary: No dysuria  Neurological: No headache, dizziness or syncope Musculoskeletal: No new muscle or joint pain Hematologic: No bleeding or bruising Psychiatric: No history of depression or anxiety   Physical Exam:  Vital signs: BP 118/78   Pulse 70   Ht 6\' 6"  (1.981 m)   Wt 210 lb (95.3 kg)   SpO2 98%   BMI 24.27 kg/m   Constitutional:   Pleasant AA male appears to be in NAD, Well developed, Well nourished, alert and cooperative Head:  Normocephalic and atraumatic. Eyes:   PEERL, EOMI. No icterus. Conjunctiva pink. Ears:  Normal auditory acuity. Neck:  Supple Throat: Oral cavity and pharynx without inflammation, swelling or lesion.  Respiratory: Respirations even and unlabored. Lungs clear to auscultation bilaterally.   No wheezes, crackles, or rhonchi.  Cardiovascular: Normal S1, S2. No MRG. Regular rate and rhythm. No peripheral edema, cyanosis or pallor.  Gastrointestinal:  Soft, nondistended, nontender. No rebound or guarding. Normal bowel sounds. No appreciable masses or hepatomegaly.  Colostomy present left lower quadrant, brown liquid stool, ostomy appears pink and without  irritation. Rectal:  Not performed.  Msk:  Symmetrical without gross deformities. Without edema, no deformity or joint abnormality.  Neurologic:  Alert and  oriented x4;  grossly normal neurologically.  Skin:   Dry and intact without significant lesions or rashes. Psychiatric:  Demonstrates good judgement and reason without abnormal affect or behaviors.  RELEVANT LABS AND IMAGING: CBC    Component Value Date/Time   WBC 9.6 11/22/2017 0556   RBC 3.43 (L) 11/22/2017 0556   HGB 8.5 (L) 11/22/2017 0556   HCT 26.6 (L) 11/22/2017 0556   PLT 355 11/22/2017 0556   MCV 77.6 (L) 11/22/2017 0556   MCH 24.8 (L) 11/22/2017 0556   MCHC 32.0 11/22/2017 0556   RDW 19.8 (H) 11/22/2017 0556   LYMPHSABS 1.4 10/23/2017 1628   MONOABS 0.8 10/23/2017 1628   EOSABS 0.2 10/23/2017 1628   BASOSABS 0.0 10/23/2017 1628    CMP     Component Value Date/Time   NA 139 11/25/2017 0931   K 4.4 11/25/2017 0931   CL 115 (H) 11/25/2017 0931   CO2 18 (L) 11/25/2017 0931   GLUCOSE 86 11/25/2017 0931   BUN 22 (H) 11/25/2017 0931   CREATININE 2.65 (H) 11/25/2017 0931   CALCIUM 8.0 (L) 11/25/2017 0931   PROT 5.9 (L) 11/20/2017 0423   ALBUMIN 1.2 (L) 11/20/2017 0423   AST 17 11/20/2017 0423   ALT 12 (L) 11/20/2017 0423   ALKPHOS 51 11/20/2017 0423   BILITOT 0.4 11/20/2017 0423   GFRNONAA 21 (L) 11/25/2017 0931   GFRAA 25 (L) 11/25/2017 0931    Assessment: 1.  History of recently diagnosed colorectal cancer: In November, status post partial colectomy with colostomy, see HPI 2. History of H.Pylori: diagnosed in August- needs recheck.  Plan: 1.  After much review of chart and notes, as patient did not know why he was coming to clinic today.  It appears that he needs an H. pylori stool antigen ordered.  We will contact him in regards to this. 2.  Called [facility] at time of patient's appointment, did set follow-up with them in their outpatient clinic to discuss colostomy and  plans for this in the future.  We are  happy to proceed with a colonoscopy if this is what they would like [place] to do.  Discussed with the patient that they can let us know. 3.  Patient to follow with [place] as needed in the future.  Ellouise Newer, PA-C [facility] 01/04/2018, 1:42 PM

## 2018-01-04 NOTE — Telephone Encounter (Signed)
-----   Message from Levin Erp, [place] sent at 01/04/2018  2:02 PM EST ----- Regarding: Doy Mince Please order hpylori fecal antigen. If on PPI this should be stopped two weeks prior. Thanks-JLL

## 2018-01-05 NOTE — Telephone Encounter (Signed)
Call placed to nursing facility and no answer or voice mail on the pt unit.

## 2018-01-05 NOTE — Progress Notes (Signed)
Reviewed and agree with documentation and assessment and plan. K. Veena Lamar Meter , MD   

## 2018-01-06 DIAGNOSIS — L97429 Non-pressure chronic ulcer of left heel and midfoot with unspecified severity: Secondary | ICD-10-CM | POA: Diagnosis not present

## 2018-01-07 NOTE — Telephone Encounter (Signed)
The nurse caring for the pt was given the order for H pylori fecal antigen and the order was faxed to [phone] Att [address] hall

## 2018-01-10 DIAGNOSIS — M6281 Muscle weakness (generalized): Secondary | ICD-10-CM | POA: Diagnosis not present

## 2018-01-10 DIAGNOSIS — I4891 Unspecified atrial fibrillation: Secondary | ICD-10-CM | POA: Diagnosis not present

## 2018-01-10 DIAGNOSIS — E8770 Fluid overload, unspecified: Secondary | ICD-10-CM | POA: Diagnosis not present

## 2018-01-10 DIAGNOSIS — R600 Localized edema: Secondary | ICD-10-CM | POA: Diagnosis not present

## 2018-01-12 DIAGNOSIS — K219 Gastro-esophageal reflux disease without esophagitis: Secondary | ICD-10-CM | POA: Diagnosis not present

## 2018-01-12 DIAGNOSIS — I509 Heart failure, unspecified: Secondary | ICD-10-CM | POA: Diagnosis not present

## 2018-01-12 DIAGNOSIS — K5701 Diverticulitis of small intestine with perforation and abscess with bleeding: Secondary | ICD-10-CM | POA: Diagnosis not present

## 2018-01-12 DIAGNOSIS — I4891 Unspecified atrial fibrillation: Secondary | ICD-10-CM | POA: Diagnosis not present

## 2018-01-12 DIAGNOSIS — I10 Essential (primary) hypertension: Secondary | ICD-10-CM | POA: Diagnosis not present

## 2018-01-12 DIAGNOSIS — D011 Carcinoma in situ of rectosigmoid junction: Secondary | ICD-10-CM | POA: Diagnosis not present

## 2018-01-12 DIAGNOSIS — M6281 Muscle weakness (generalized): Secondary | ICD-10-CM | POA: Diagnosis not present

## 2018-01-18 DIAGNOSIS — C19 Malignant neoplasm of rectosigmoid junction: Secondary | ICD-10-CM | POA: Diagnosis not present

## 2018-01-18 DIAGNOSIS — K5790 Diverticulosis of intestine, part unspecified, without perforation or abscess without bleeding: Secondary | ICD-10-CM | POA: Diagnosis not present

## 2018-01-18 DIAGNOSIS — L89622 Pressure ulcer of left heel, stage 2: Secondary | ICD-10-CM | POA: Diagnosis not present

## 2018-01-18 DIAGNOSIS — L89612 Pressure ulcer of right heel, stage 2: Secondary | ICD-10-CM | POA: Diagnosis not present

## 2018-01-18 DIAGNOSIS — Z433 Encounter for attention to colostomy: Secondary | ICD-10-CM | POA: Diagnosis not present

## 2018-01-18 DIAGNOSIS — I10 Essential (primary) hypertension: Secondary | ICD-10-CM | POA: Diagnosis not present

## 2018-01-19 ENCOUNTER — Telehealth: Payer: Self-pay

## 2018-01-19 NOTE — Telephone Encounter (Signed)
Received call from [place], [place] with [facility]. Patient was on our service while hospitalized and has an appt for hospital f/u and to establish care on 01/26/18 at 1:45 with Dr. Garlan Fillers.  RN would like to know if verbal orders can go ahead and be given for patient education and management of new colostomy and wound care to bilateral stage 2 pressure ulcers of heels. Nicki Reaper can be reached at [phone]. Danley Danker, RN [facility] RN)

## 2018-01-21 NOTE — Telephone Encounter (Signed)
Verbal orders given per Dr. Mingo Amber. Katharina Caper, Lauris Keepers D, [place]

## 2018-01-21 NOTE — Telephone Encounter (Signed)
Please give verbal orders for this.  Thanks!  JW

## 2018-01-26 ENCOUNTER — Other Ambulatory Visit: Payer: Self-pay

## 2018-01-26 ENCOUNTER — Ambulatory Visit (INDEPENDENT_AMBULATORY_CARE_PROVIDER_SITE_OTHER): Payer: Medicare Other | Admitting: Family Medicine

## 2018-01-26 ENCOUNTER — Encounter: Payer: Self-pay | Admitting: Family Medicine

## 2018-01-26 VITALS — BP 146/80 | HR 81 | Temp 97.6°F | Ht 78.0 in | Wt 222.6 lb

## 2018-01-26 DIAGNOSIS — R609 Edema, unspecified: Secondary | ICD-10-CM | POA: Insufficient documentation

## 2018-01-26 DIAGNOSIS — D500 Iron deficiency anemia secondary to blood loss (chronic): Secondary | ICD-10-CM

## 2018-01-26 DIAGNOSIS — L97409 Non-pressure chronic ulcer of unspecified heel and midfoot with unspecified severity: Secondary | ICD-10-CM | POA: Diagnosis not present

## 2018-01-26 DIAGNOSIS — I4891 Unspecified atrial fibrillation: Secondary | ICD-10-CM

## 2018-01-26 MED ORDER — APIXABAN 2.5 MG PO TABS: 2.5000 mg | ORAL_TABLET | Freq: Two times a day (BID) | ORAL | 0 refills | Status: DC

## 2018-01-26 NOTE — Assessment & Plan Note (Signed)
Continue Amiodarone 200mg  and refilled Eliquis with free 30 day card.   Will forward to Dr. Valentina Lucks to see if patient will qualify for [facility] support to help him afford Eliquis.  Will set up Cardiology follow up when he comes in for appointment in one week.

## 2018-01-26 NOTE — Patient Instructions (Addendum)
It was great to see you today! Thank you for letting me participate in your care!  Today, we discussed having proper follow up care with oncology and surgery for managing your colostomy and your finding of colorectal carcinoma following your hospital admission.  We will contact your oncologist and your surgeon to arrange follow up appointment.  I have ordered you some compression stockings, please wear them to help reduce your leg swelling. Be sure  We are getting labs for you to check electrolyte levels. I will follow up with you in one week.  Be well, Harolyn Rutherford, DO PGY-1, [facility]

## 2018-01-26 NOTE — Assessment & Plan Note (Signed)
Potential cause of edema and he does have history via GI blood loss. Unlikely cause as he is asymptomatic and his surgery was successful in closing the perforation.  CBC and will follow up.

## 2018-01-26 NOTE — Progress Notes (Signed)
Subjective: No chief complaint on file.  HPI: Roberto Owen is a 83 y.o. presenting to clinic today to discuss the following:  Hospital follow up for [facility] procedure with colostomy due to perforated viscus and new finding of colorectal carcinoma T4N1. He has a PMH of Atrial Fibrillation, Anemia, Gastric Ulcer with perforation, and diverticulitis.  Hospital Follow Up: Mr. Roberto Owen has been in SNF and states he has been doing well and "breaking all the records" in rehab. He can now dress, cook, feed, and bath himself without assistance. He is in good spirits today and is ambulatory with a walker. He has only been to see Gastroenterology but has not been seen by Oncology or Owen for follow up after his hospital stay.  Bilateral Leg Edema: Patient states he has been taking his Lasix 80mg  everyday but wasn't sure at the beginning of the visit. He states it has not been getting better but no pain but he does have difficulty putting on his shoes. No difficulty walking. The swelling is in his ankles and goes just below his knees.  Colostomy Bag: Patient states his colostomy bag leaks sometimes. He states the tape is coming loose from the skin and that the tape irritates his skin. He states otherwise, he is having no troubles with the bag. He believes the pressure gets increased in his bag at times and causes the tape to get loose causing the leakage. This occurs mostly at night. He has not been back to see Owen for follow up.  Eliquis: Patient states he cannot afford Eliquis b/c it costs "$500 dollars" and he is on a fixed income. Patient is currently out of Eliquis and has not taken since he left rehab which was approximately 1/2 week ago.  Bilateral Heel Ulcer: He has bilateral heel ulcer that wound care is seeing him for and he does have home health nurse that is going to his house and checking on him for this.  Review of Systems  Constitutional: Negative for chills, fever and  weight loss.  HENT: Negative for sinus pain.   Eyes: Negative for blurred vision and double vision.  Respiratory: Negative for cough, sputum production, shortness of breath and wheezing.   Cardiovascular: Positive for leg swelling. Negative for chest pain, palpitations, orthopnea and PND.  Gastrointestinal: Negative for abdominal pain, blood in stool, constipation, diarrhea, nausea and vomiting.  Genitourinary: Positive for frequency and urgency. Negative for dysuria.  Musculoskeletal: Negative for joint pain and myalgias.  Skin: Negative for itching and rash.  Neurological: Negative for dizziness, loss of consciousness, weakness and headaches.   Health Maintenance: none today     ROS noted in HPI.   Past Medical, Surgical, Social, and Family History Reviewed & Updated per EMR.   Pertinent Historical Findings include:   Social History   Tobacco Use  Smoking Status Never Smoker  Smokeless Tobacco Never Used      Objective: BP (!) 146/80   Pulse 81   Temp 97.6 F (36.4 C) (Oral)   Ht 6\' 6"  (1.981 m)   Wt 222 lb 9.6 oz (101 kg)   SpO2 99%   BMI 25.72 kg/m  Vitals and nursing notes reviewed  Physical Exam  Constitutional: He is oriented to person, place, and time and well-developed, well-nourished, and in no distress. No distress.  HENT:  Head: Normocephalic and atraumatic.  Eyes: EOM are normal. Pupils are equal, round, and reactive to light.  Cardiovascular: Normal rate, regular rhythm and intact distal  pulses.  Murmur heard. 2/6 systolic murmur  Pulmonary/Chest: Effort normal and breath sounds normal. No respiratory distress. He has no wheezes. He has no rales.  Abdominal: Soft. Bowel sounds are normal. He exhibits no distension. There is no tenderness. There is no rebound and no guarding.  Colostomy bag in ULQ, in place with greenish brown liquid stool  Musculoskeletal: Normal range of motion. He exhibits edema. He exhibits no tenderness or deformity.  +2 edema  from below knees to ankles  Neurological: He is alert and oriented to person, place, and time.  Skin: Skin is warm and dry. No rash noted. No erythema.  Psychiatric: Mood and affect normal.   No results found for this or any previous visit (from the past 72 hour(s)).  Assessment/Plan:  Edema Possible etiology includes oncotic pressure loss due to poor protein intake and malnutrition vs CHF vs Kidney failure vs Cirrohsis  Unlikely to CHF as he has no SOB, no difficulty breathing, and his activity has been increasing with rehab. Unlikely to be Cirrohsis as this is new and rapid onset. He has no history of liver failure no risk factors such as ETOH abuse or known fatty liver disease. Kidney failure is also unlikely as he was recently in the hospital and kidney function was within normal limits.  Likely due to poor nutrition. Getting cmp to check for protein and albumin.  Ordered compression stockings, continue Furosemide 80mg  BID, follow up in one week.    Atrial fibrillation (HCC) Continue Amiodarone 200mg  and refilled Eliquis with free 30 day card.   Will forward to Roberto Owen to see if patient will qualify for [facility] support to help him afford Eliquis.  Will set up Cardiology follow up when he comes in for appointment in one week.  Anemia due to GI blood loss Potential cause of edema and he does have history via GI blood loss. Unlikely cause as he is asymptomatic and his Owen was successful in closing the perforation.  CBC and will follow up.   Colorectal Carcinoma: He was seen by Roberto Owen while in the hospital. We will arrange to contact the [place] at [place] [phone]. Mr Roberto Owen needs to be seen by oncology to discuss management of his colorectal carcinoma.  Status Post Hartmann's Procedure with colostomy bag: He was seen by Roberto Owen while in the hospital and will need follow up to discuss options of reversal of colectomy vs  keeping his colostomy bag. Contact number is [phone].  PATIENT EDUCATION PROVIDED: See AVS    Diagnosis and plan along with any newly prescribed medication(s) were discussed in detail with this patient today. The patient verbalized understanding and agreed with the plan. Patient advised if symptoms worsen return to clinic or ER.   Health Maintainance:   Orders Placed This Encounter  Procedures  . Compression stockings  . CBC with Differential  . Comprehensive metabolic panel  . Iron and TIBC    Meds ordered this encounter  Medications  . apixaban (ELIQUIS) 2.5 MG TABS tablet    Sig: Take 1 tablet (2.5 mg total) by mouth 2 (two) times daily.    Dispense:  60 tablet    Refill:  0     Harolyn Rutherford, DO 01/26/2018, 1:43 PM PGY-1, [place]

## 2018-01-26 NOTE — Assessment & Plan Note (Signed)
Possible etiology includes oncotic pressure loss due to poor protein intake and malnutrition vs CHF vs Kidney failure vs Cirrohsis  Unlikely to CHF as he has no SOB, no difficulty breathing, and his activity has been increasing with rehab. Unlikely to be Cirrohsis as this is new and rapid onset. He has no history of liver failure no risk factors such as ETOH abuse or known fatty liver disease. Kidney failure is also unlikely as he was recently in the hospital and kidney function was within normal limits.  Likely due to poor nutrition. Getting cmp to check for protein and albumin.  Ordered compression stockings, continue Furosemide 80mg  BID, follow up in one week.

## 2018-01-27 LAB — COMPREHENSIVE METABOLIC PANEL
ALT: 26 IU/L (ref 0–44)
AST: 28 IU/L (ref 0–40)
Albumin/Globulin Ratio: 0.7 — ABNORMAL LOW (ref 1.2–2.2)
Albumin: 2.9 g/dL — ABNORMAL LOW (ref 3.5–4.7)
Alkaline Phosphatase: 84 IU/L (ref 39–117)
BUN/Creatinine Ratio: 15 (ref 10–24)
BUN: 27 mg/dL (ref 8–27)
Bilirubin Total: <0.2 mg/dL (ref 0.0–1.2)
CHLORIDE: 100 mmol/L (ref 96–106)
CO2: 28 mmol/L (ref 20–29)
Calcium: 8.8 mg/dL (ref 8.6–10.2)
Creatinine, Ser: 1.76 mg/dL — ABNORMAL HIGH (ref 0.76–1.27)
GFR, EST AFRICAN AMERICAN: 41 mL/min/{1.73_m2} — AB (ref >59)
GFR, EST NON AFRICAN AMERICAN: 36 mL/min/{1.73_m2} — AB (ref >59)
GLUCOSE: 94 mg/dL (ref 65–99)
Globulin, Total: 4.2 g/dL (ref 1.5–4.5)
Potassium: 4.4 mmol/L (ref 3.5–5.2)
Sodium: 140 mmol/L (ref 134–144)
TOTAL PROTEIN: 7.1 g/dL (ref 6.0–8.5)

## 2018-01-27 LAB — CBC WITH DIFFERENTIAL/PLATELET
BASOS ABS: 0 10*3/uL (ref 0.0–0.2)
Basos: 1 %
EOS (ABSOLUTE): 0.1 10*3/uL (ref 0.0–0.4)
Eos: 2 %
Hematocrit: 27.1 % — ABNORMAL LOW (ref 37.5–51.0)
Hemoglobin: 8.3 g/dL — ABNORMAL LOW (ref 13.0–17.7)
IMMATURE GRANS (ABS): 0 10*3/uL (ref 0.0–0.1)
Immature Granulocytes: 0 %
LYMPHS: 22 %
Lymphocytes Absolute: 1.4 10*3/uL (ref 0.7–3.1)
MCH: 26.4 pg — ABNORMAL LOW (ref 26.6–33.0)
MCHC: 30.6 g/dL — ABNORMAL LOW (ref 31.5–35.7)
MCV: 86 fL (ref 79–97)
Monocytes Absolute: 0.6 10*3/uL (ref 0.1–0.9)
Monocytes: 9 %
NEUTROS PCT: 66 %
Neutrophils Absolute: 4.4 10*3/uL (ref 1.4–7.0)
PLATELETS: 439 10*3/uL — AB (ref 150–379)
RBC: 3.14 x10E6/uL — AB (ref 4.14–5.80)
RDW: 21.2 % — ABNORMAL HIGH (ref 12.3–15.4)
WBC: 6.6 10*3/uL (ref 3.4–10.8)

## 2018-01-27 LAB — IRON AND TIBC
IRON SATURATION: 15 % (ref 15–55)
IRON: 25 ug/dL — AB (ref 38–169)
Total Iron Binding Capacity: 171 ug/dL — ABNORMAL LOW (ref 250–450)
UIBC: 146 ug/dL (ref 111–343)

## 2018-02-01 NOTE — Progress Notes (Signed)
   Subjective   Patient ID: Roberto Owen    DOB: [birth year], 83 y.o. male   MRN: [id number]  CC: "Lower leg swelling"  HPI: Roberto Owen is a 83 y.o. male who presents to clinic today for the following:  Leg edema: Patient was hospitalized for atrial fibrillation which was controlled with amiodarone use prior to discharge.  He followed up at [facility] but developed bilateral leg edema thought to be related to a HFpEF exacerbation.  Legs significantly improved with Lasix 80 mg twice daily.  He has not yet followed up with a cardiologist since discharge from the hospital.  Patient denies chest pain, palpitations, shortness of breath, syncope.  He has been adherent to his Eliquis and denies melena or hematochezia though he does have known colorectal cancer which is to be followed up in 1 week by an oncologist.  ROS: see HPI for pertinent.  [place]: Colon cancer, PUD, PAF, severe protein calorie malnutrition, thrombocytosis, microcytic anemia, hyponatremia.  Surgical history laparotomy, IR intervention.  Surgical history arthritis.  Smoking status reviewed. Medications reviewed.  Objective   BP 120/60   Pulse 81   Temp 97.8 F (36.6 C) (Oral)   Ht 6\' 6"  (1.981 m)   Wt 220 lb (99.8 kg)   SpO2 98%   BMI 25.42 kg/m  Vitals and nursing note reviewed.  General: frail elderly male with walker, NAD with non-toxic appearance HEENT: normocephalic, atraumatic, moist mucous membranes Neck: supple, non-tender without lymphadenopathy, no JVD Cardiovascular: regular rate and rhythm without murmurs, rubs, or gallops Lungs: clear to auscultation bilaterally with normal work of breathing Skin: warm, dry, no rashes or lesions, cap refill < 2 seconds Extremities: warm and well perfused, normal tone, minimal pitting edema on lower legs up to mid shin bilaterally  Assessment & Plan   Lower leg edema Chronic.  Improved.  Likely related to HFpEF exacerbation.  Now resolved with use of Lasix.  Patient is  asymptomatic. - Ambulatory referral to cardiology placed - Patient is to continue Lasix 80 mg twice daily along with amiodarone and Eliquis for A. Fib - Reviewed return precautions  No orders of the defined types were placed in this encounter.  No orders of the defined types were placed in this encounter.   Harriet Butte, [place], PGY-2 02/02/2018, 12:16 PM

## 2018-02-02 ENCOUNTER — Ambulatory Visit (INDEPENDENT_AMBULATORY_CARE_PROVIDER_SITE_OTHER): Payer: Medicare Other | Admitting: Family Medicine

## 2018-02-02 ENCOUNTER — Other Ambulatory Visit: Payer: Self-pay

## 2018-02-02 ENCOUNTER — Telehealth: Payer: Self-pay | Admitting: *Deleted

## 2018-02-02 ENCOUNTER — Encounter: Payer: Self-pay | Admitting: Family Medicine

## 2018-02-02 VITALS — BP 120/60 | HR 81 | Temp 97.8°F | Ht 78.0 in | Wt 220.0 lb

## 2018-02-02 DIAGNOSIS — R600 Localized edema: Secondary | ICD-10-CM | POA: Diagnosis present

## 2018-02-02 DIAGNOSIS — I480 Paroxysmal atrial fibrillation: Secondary | ICD-10-CM | POA: Diagnosis not present

## 2018-02-02 NOTE — Assessment & Plan Note (Signed)
Chronic.  Improved.  Likely related to HFpEF exacerbation.  Now resolved with use of Lasix.  Patient is asymptomatic. - Ambulatory referral to cardiology placed - Patient is to continue Lasix 80 mg twice daily along with amiodarone and Eliquis for A. Fib - Reviewed return precautions

## 2018-02-02 NOTE — Patient Instructions (Signed)
Thank you for coming in to see [place] today. Please see below to review our plan for today's visit.  1.  Continue working on getting coverage for Medicare part D.  Until then he may have to pay out-of-pocket for the Eliquis. 2.  I have placed a referral for Mr. Heap to see a cardiologist.  They will contact you to schedule the appointment sometime this week. 3.  Continue taking the Lasix 80 mg twice daily along with the other heart medications.  If you develop shortness of breath, chest pain, seek medical attention immediately.  You can take an additional dose of Lasix if you develop more fluid in the thighs.  Please call the clinic at [phone] if your symptoms worsen or you have any concerns. It was our pleasure to serve you.  Harriet Butte, [place], PGY-2

## 2018-02-02 NOTE — Telephone Encounter (Signed)
-----   Message from Nuala Alpha, DO sent at 01/26/2018  3:51 PM EST ----- Regarding: F/U Appointments Roberto Owen will need to follow up with the following providers that provided care for him in the hospital.  Colorectal Carcinoma: He was seen by Dr. Benay Spice while in the hospital. Please call him via the [facility] [place] [phone]. Roberto Owen needs to be seen by oncology to discuss management of his colorectal carcinoma.  Status Post Hartmann's Procedure with colostomy bag: He was seen by [facility] while in the hospital and will need follow up to discuss options of reversal of colectomy vs keeping his colostomy bag. Contact number is [phone].  Thanks so much for doing this for Roberto Owen! Please call him or his daughter when we have appointments and let me know if there are any issues or problems.  Roberto Owen: [phone] [name] (daughter): [phone] [name] (son-in-law): [phone]

## 2018-02-02 NOTE — Telephone Encounter (Signed)
Pt came in the office today with his daughter, all 3 appts were given to her. Kenyata Guess Kennon Holter, CMA

## 2018-02-10 ENCOUNTER — Inpatient Hospital Stay: Payer: Medicare Other | Attending: Hematology | Admitting: Medical

## 2018-02-10 VITALS — BP 134/61 | HR 74 | Temp 98.5°F | Resp 18 | Ht 78.0 in | Wt 225.5 lb

## 2018-02-10 DIAGNOSIS — R1032 Left lower quadrant pain: Secondary | ICD-10-CM | POA: Insufficient documentation

## 2018-02-10 DIAGNOSIS — D500 Iron deficiency anemia secondary to blood loss (chronic): Secondary | ICD-10-CM | POA: Diagnosis not present

## 2018-02-10 DIAGNOSIS — C186 Malignant neoplasm of descending colon: Secondary | ICD-10-CM | POA: Diagnosis not present

## 2018-02-10 DIAGNOSIS — Z7901 Long term (current) use of anticoagulants: Secondary | ICD-10-CM | POA: Diagnosis not present

## 2018-02-10 DIAGNOSIS — Z8711 Personal history of peptic ulcer disease: Secondary | ICD-10-CM

## 2018-02-10 DIAGNOSIS — N323 Diverticulum of bladder: Secondary | ICD-10-CM | POA: Diagnosis not present

## 2018-02-10 DIAGNOSIS — Z8719 Personal history of other diseases of the digestive system: Secondary | ICD-10-CM | POA: Insufficient documentation

## 2018-02-10 DIAGNOSIS — I4891 Unspecified atrial fibrillation: Secondary | ICD-10-CM | POA: Diagnosis not present

## 2018-02-10 NOTE — Progress Notes (Signed)
Called and left VM on pt's daughter's VM reminding them of appointment today at 2:30 PM. Left my contact information on VM for further questions.

## 2018-02-11 ENCOUNTER — Telehealth: Payer: Self-pay | Admitting: General Practice

## 2018-02-11 ENCOUNTER — Encounter: Payer: Self-pay | Admitting: Medical

## 2018-02-11 NOTE — Progress Notes (Addendum)
[place]    Telephone:([phone] Fax:([phone]) [place] Note   [name], DO as PCP - General (Family Medicine) 02/10/2018  CHIEF COMPLAINTS/PURPOSE OF CONSULTATION:  pT4a, pN1b,pMx moderately differentiated invasive adenocarcinoma of the descending and sigmoid colon with 2/5 lymph node showing metastatic carcinoma with extracapsular extension.  Positive microscopic extension of invasive tumor into pericolonic soft tissue with involvement of the inked serosa.  Negative lymphovascular invasion with positive perineural invasion noted.  Macroscopic tumor perforation Owen noted.  Missed match repair protein returned normal with MSI stable and percent tumor noted on H and E at 80%.  Roberto Owen diagnosis dates to 11/14/2017.  HISTORY OF PRESENTING ILLNESS:  Roberto Owen 83 y.o. male who presents to clinic today in follow-up of a consultation with Dr. Burr Medico from 11/19/2017.  Roberto Owen' recent history began in August 2018 when he traveled to [place] from [place] to visit family and to see his grandson play football.  He presented to the emergency room at [facility] on 07/23/2017 after having a syncopal episode.  His labs returned showing a hemoglobin of 6.2, hematocrit of 23.1, MCV of 60 and ferritin of 4.  Stools were guaiac positive.  He acknowledged that he had been taking BC and Goody's powders for an extended period.  He Owen transfused with packed red blood cells.  Additionally he Owen taken for an upper endoscopy on 07/25/2017  [phone]) which showed chronic active gastritis with H. pylori.  No metaplasia dysplasia or malignancy Owen noted.  He Owen given 510 mg of parenteral iron while hospitalized and Owen restarted on oral iron.  He reported a GI bleed in around 2012 while in [place].  He Owen admitted to the hospital and transfused with packed red blood cells at that time and started on oral iron.  Roberto Owen Owen  admitted to [facility] again on 09/16/2017 after presenting to the emergency room with a 4-5-day history of abdominal pain, nausea, and loss of appetite.  His abdominal pain Owen localized in the left lower quadrant.  He had not had a bowel movement for 3-4 days.  A CT scan of his abdomen and pelvis Owen completed and showed a large inflammatory pericolonic ill-defined heterogeneous collection arising from the distal descending colon and containing air, fluid, and possible stool.  Colonic wall thickening Owen noted just proximal and distal to this lesion.  Roberto Owen admitted for IV antibiotics and for a consultation with interventional radiology for placement of a percutaneous drain.  A percutaneous drain Owen placed under CT guidance on 09/16/2017 with 50 cc of material drained.  Cultures were collected and returned inconclusive.  A repeat CT scan completed on 09/20/2017 showed significant improvement.  The patient Owen discharged home on oral antibiotics with a percutaneous drain in place.  Roberto Owen Owen next seen in the emergency room on 10/23/2017 when he presented with 1 week of dark discharge from his percutaneous drain and around the drain insertion site.  Recommendations were for placement of a larger percutaneous drain.  Roberto Owen Owen evaluated and discharged home.  A new 14 French drain Owen placed on 10/28/2017.  Roberto Owen again presented to the emergency room on 11/08/2017 when his percutaneous drain stopped producing output and he noted a 4-day history of loss of appetite, GI spasms with pickups, nausea, and vomiting.  On this admission he Owen found to have new onset atrial fibrillation with RVR.  Cardiology Owen consulted.  He Owen begun on a diltiazem drip with poor control of his A. fib.  He Owen next begun on an amiodarone drip.  He Owen well controlled on amiodarone.  Diltiazem Owen discontinued and he Owen converted to oral amiodarone.  He Owen found to have a left loculated effusion  from a presumed pneumonia.  Interventional radiology Owen consulted with a drain placed.  Patient became bradycardic with altered mental status and Owen found to have a pneumoperitoneum on the KUB.  He Owen taken for emergent surgery at which time a Hartman's procedure Owen performed on 11/14/2017.  He Owen given a colostomy and had a colon resection.  His pathology ([id number]) returned showing a pT4a, pN1b colorectal carcinoma with 2 of 5 lymph nodes positive for metastatic disease.  He Owen treated with vancomycin for 10 days and Zosyn for 14 days postoperatively.  During his period of recovery he Owen found to have acute kidney injury due to poor oral hydration.  Roberto Owen Owen ultimately discharged to rehab on 11/26/2017.  During his last hospitalization he Owen seen by Dr. Burr Medico in consultation on 11/19/2017.  MEDICAL HISTORY:  Past Medical History:  Diagnosis Date  . Anemia   . Bladder diverticulum 11/10/2017  . Esophageal ulcer    hx/notes 09/15/2017  . Gastric ulcer    hx/notes 09/15/2017  . GI bleed   . History of blood transfusion 07/2017  . NSAID induced gastritis     SURGICAL HISTORY: Past Surgical History:  Procedure Laterality Date  . COLONOSCOPY  ~ 05/2016  . ESOPHAGOGASTRODUODENOSCOPY (EGD) WITH PROPOFOL N/A 07/25/2017   Procedure: ESOPHAGOGASTRODUODENOSCOPY (EGD) WITH PROPOFOL;  Surgeon: Mauri Pole, MD;  Location: [facility];  Service: Endoscopy;  Laterality: N/A;  . IR CATHETER TUBE CHANGE  10/28/2017  . IR RADIOLOGIST EVAL & MGMT  09/30/2017  . IR RADIOLOGIST EVAL & MGMT  10/19/2017  . LAPAROTOMY N/A 11/14/2017   Procedure: EXPLORATORY LAPAROTOMY WITH HARTMANN PROCEDURE;  Surgeon: Clovis Riley, MD;  Location: [facility];  Service: General;  Laterality: N/A;    SOCIAL HISTORY: Social History   Socioeconomic History  . Marital status: Divorced    Spouse name: Not on file  . Number of children: Not on file  . Years of education: Not on file  . Highest education  level: Not on file  Social Needs  . Financial resource strain: Not on file  . Food insecurity - worry: Not on file  . Food insecurity - inability: Not on file  . Transportation needs - medical: Not on file  . Transportation needs - non-medical: Not on file  Occupational History  . Not on file  Tobacco Use  . Smoking status: Never Smoker  . Smokeless tobacco: Never Used  Substance and Sexual Activity  . Alcohol use: No  . Drug use: No  . Sexual activity: Not on file  Other Topics Concern  . Not on file  Social History Narrative  . Not on file    FAMILY HISTORY: Family History  Problem Relation Age of Onset  . Arthritis Mother   . Arthritis Father   . Heart disease Neg Hx   . Kidney disease Neg Hx   . Diabetes Neg Hx     ALLERGIES:  has No Known Allergies.  MEDICATIONS:  Current Outpatient Medications  Medication Sig Dispense Refill  . acetaminophen (TYLENOL) 500 MG tablet Take 1,000 mg by mouth every 6 (six) hours as needed for mild pain.    Marland Kitchen  amiodarone (PACERONE) 200 MG tablet Take 1 tablet (200 mg total) by mouth daily. 30 tablet 0  . apixaban (ELIQUIS) 2.5 MG TABS tablet Take 1 tablet (2.5 mg total) by mouth 2 (two) times daily. 60 tablet 0  . ferrous sulfate 325 (65 FE) MG tablet Take 1 tablet (325 mg total) by mouth daily with breakfast. 30 tablet 5  . furosemide (LASIX) 80 MG tablet Take 80 mg by mouth 2 (two) times daily.  0  . metoprolol tartrate (LOPRESSOR) 25 MG tablet Take 0.5 tablets (12.5 mg total) by mouth 2 (two) times daily. 30 tablet   . Multiple Vitamins-Minerals (ADULT ONE DAILY GUMMIES) CHEW Chew 1 tablet by mouth daily.     . pantoprazole (PROTONIX) 40 MG tablet TAKE 1 TABLET BY MOUTH TWICE DAILY 180 tablet 2  . Simethicone 180 MG CAPS Take 1 capsule every 4 (four) hours as needed by mouth (for gas, bloating).     No current facility-administered medications for this visit.     REVIEW OF SYSTEMS:   Constitutional: Denies fevers, chills or  abnormal night sweats.  He reports that his strength is recovering and that he is exercising several times daily. Eyes: Denies blurriness of vision, double vision or watery eyes Ears, nose, mouth, throat, and face: Denies mucositis or sore throat Respiratory: Denies cough, dyspnea or wheezes Cardiovascular: Denies palpitation, chest discomfort or lower extremity swelling Gastrointestinal:  Denies nausea, vomiting or heartburn. He continues to have a colostomy which has leaked episodically.  His appetite is good.  He reports that he is eating well. Skin: Denies abnormal skin rashes Lymphatics: Denies new lymphadenopathy or easy bruising Neurological:Denies numbness, tingling or new weaknesses Behavioral/Psych: Mood is stable, no new changes  All other systems were reviewed with the patient and are negative.  PHYSICAL EXAMINATION: ECOG PERFORMANCE STATUS: 1  Vitals:   02/10/18 1437  BP: 134/61  Pulse: 74  Resp: 18  Temp: 98.5 F (36.9 C)  SpO2: 100%   Filed Weights   02/10/18 1437  Weight: 225 lb 8 oz (102.3 kg)    GENERAL: Roberto Owen is an elderly male who appears to be in no apparent distress.   SKIN: skin color, texture, turgor are normal, no rashes or significant lesions EYES: normal, conjunctiva are pink and non-injected, sclera clear OROPHARYNX:no exudate, no erythema and lips, buccal mucosa, and tongue normal. Multiple teeth are missing.  Dentition is in poor repair. NECK: supple, thyroid normal size, non-tender, without nodularity LYMPH:  no palpable lymphadenopathy in the cervical, axillary or inguinal LUNGS: clear to auscultation and percussion with normal breathing effort HEART: regular rate & rhythm and no murmurs and no lower extremity edema ABDOMEN:abdomen soft, non-tender and normal bowel sounds. There is a colostomy containing soft brown stool in place in the left lower quadrant.  There is a well-healed low midline surgical incision. Musculoskeletal:no cyanosis  of digits and no clubbing  PSYCH: alert & oriented x 3 with fluent speech NEURO: no focal motor/sensory deficits  LABORATORY DATA:  I have reviewed the data as listed CBC Latest Ref Rng & Units 01/26/2018 11/22/2017 11/20/2017  WBC 3.4 - 10.8 x10E3/uL 6.6 9.6 8.8  Hemoglobin 13.0 - 17.7 g/dL 8.3(L) 8.5(L) 9.3(L)  Hematocrit 37.5 - 51.0 % 27.1(L) 26.6(L) 29.2(L)  Platelets 150 - 379 x10E3/uL 439(H) 355 298    RADIOGRAPHIC STUDIES: I have personally reviewed the radiological images as listed and agreed with the findings in the report. No results found.  ASSESSMENT & PLAN:  pT4a, pN1b,pMx  moderately differentiated invasive adenocarcinoma of the descending and sigmoid colon: Dr. Morey Hummingbird has met with the patient and his daughter today.  She discussed with him at length that he is 3 months out from his diagnosis.  Roberto Owen and his daughter were told that he would not be a candidate for IV chemotherapy.  Dr. Truitt Merle discussed the use of oral Xeloda with the patient if he Owen interested in being treated.  She stated that she would prefer him for repeat CT scans now if he desired to begin Xeloda.  If not she would defer the scans for around 3 months.  Dr. Truitt Merle discussed with the patient that he had around a 50% chance of disease recurrence based on his pathology.  The patient and his daughter were given information on Xeloda and were given copies of his pathology report and have agreed to consider the options outlined by Dr. Truitt Merle.  They will call by early next week with their decision.  Orders Placed This Encounter  Procedures  . Ambulatory referral to Social Work    Referral Priority:   Routine    Referral Type:   Consultation    Referral Reason:   Specialty Services Required    Number of Visits Requested:   1    All questions were answered. The patient knows to call the clinic with any problems, questions or concerns. I spent 45 minutes counseling the patient face to face. The total time  spent in the appointment Owen 60 minutes and more than 50% Owen on counseling.     Harle Stanford, PA-C 02/11/2018 1:37 PM  This patient Owen seen with Dr. Burr Medico with my treatment plan reviewed with her. She expressed agreement with my medical management of this patient.  Addendum  I have seen the patient, examined him. I agree with the assessment and and plan and have edited the notes.   I met Roberto Owen in the hospital when he Owen initially diagnosed with colon cancer in November 2018. Due to the abscess and other complication related to the perforated colon cancer, he had prolonged hospital stay and rehab. He Owen finally discharged from nursing home to home about 1 months ago, and he has gradually recovered well.  He now lives with his daughter at home, and functions independently.  He has good appetite and energy level, and has been doing exercise at home.  We discussed his staging, the nature history of colon cancer, and the high risk of recurrence after surgery.  Due to the Stage IIIB ([id number]) disease and perforation before surgery, he is at very high risk for metastasis, especially peritoneal metastasis.  I encouraged him to consider adjuvant chemotherapy.  Due to his advanced age, and prolonged recovery, he is not a candidate for intensive FOLFOX or CAPOX, but he probably would be able to tolerate single agent Xeloda.  The problem is his surgery Owen close to 3 months ago, and we know no significant benefit of adjuvant chemo if it is given 3 months or more after surgery.  We discussed the above with patient and his daughter in great details.  I reviewed the potential side effects of Xeloda, and printed reading material Owen offered to him.  He will think about it and discuss with his family, and let me know next week for his decision on chemotherapy.  We also discussed the colon cancer surveillance, given his high risk disease, I would recommend abdominal CT every 3-6 months for a few  times, then  every 6-12 month for up to 5 years, along with close office follow up, he agrees.  If he decides not to take chemo, I plan to see him back in 2-3 months with a restaging CT abdomen and pelvis. If he does take chemo, I will recommend a total of 3-4 months given the low benefit of chemo due to the delayed start, and get a restaging CT in the next few weeks.   I spent a total of 40 minutes for his visit, more than 50% of face-to-face counseling.  Truitt Merle  02/11/2018

## 2018-02-11 NOTE — Telephone Encounter (Signed)
[facility] CSW Progress Notes  Referral received, CSW asked to schedule appointment to meet w patient.  Second attempt to reach patient to schedule.  No VM, unable to leave a message.  Edwyna Shell, LCSW Clinical Social Worker Phone:  [phone]

## 2018-02-14 ENCOUNTER — Telehealth: Payer: Self-pay | Admitting: Hematology

## 2018-02-14 NOTE — Telephone Encounter (Signed)
In basket message was sent to Social Work for then to scheduled appointment per 2/25 sched msg

## 2018-02-15 ENCOUNTER — Other Ambulatory Visit: Payer: Self-pay | Admitting: Nurse Practitioner

## 2018-02-15 ENCOUNTER — Encounter (HOSPITAL_COMMUNITY): Payer: Self-pay | Admitting: Emergency Medicine

## 2018-02-15 ENCOUNTER — Emergency Department (HOSPITAL_COMMUNITY)
Admission: EM | Admit: 2018-02-15 | Discharge: 2018-02-15 | Disposition: A | Discharge status: Home / Self Care | Payer: Medicare Other | Attending: Emergency Medicine | Admitting: Emergency Medicine

## 2018-02-15 DIAGNOSIS — K9400 Colostomy complication, unspecified: Secondary | ICD-10-CM | POA: Diagnosis not present

## 2018-02-15 DIAGNOSIS — C186 Malignant neoplasm of descending colon: Secondary | ICD-10-CM

## 2018-02-15 DIAGNOSIS — Z79899 Other long term (current) drug therapy: Secondary | ICD-10-CM | POA: Diagnosis not present

## 2018-02-15 DIAGNOSIS — Z85038 Personal history of other malignant neoplasm of large intestine: Secondary | ICD-10-CM | POA: Diagnosis not present

## 2018-02-15 DIAGNOSIS — K9403 Colostomy malfunction: Secondary | ICD-10-CM | POA: Insufficient documentation

## 2018-02-15 DIAGNOSIS — T83498A Other mechanical complication of other prosthetic devices, implants and grafts of genital tract, initial encounter: Secondary | ICD-10-CM | POA: Diagnosis not present

## 2018-02-15 LAB — BASIC METABOLIC PANEL
Anion gap: 8 (ref 5–15)
BUN: 30 mg/dL — AB (ref 6–20)
CHLORIDE: 105 mmol/L (ref 101–111)
CO2: 27 mmol/L (ref 22–32)
CREATININE: 1.53 mg/dL — AB (ref 0.61–1.24)
Calcium: 8.7 mg/dL — ABNORMAL LOW (ref 8.9–10.3)
GFR calc Af Amer: 48 mL/min — ABNORMAL LOW (ref >60)
GFR calc non Af Amer: 41 mL/min — ABNORMAL LOW (ref >60)
Glucose, Bld: 85 mg/dL (ref 65–99)
Potassium: 3.3 mmol/L — ABNORMAL LOW (ref 3.5–5.1)
Sodium: 140 mmol/L (ref 135–145)

## 2018-02-15 LAB — CBC
HEMATOCRIT: 28.7 % — AB (ref 39.0–52.0)
HEMOGLOBIN: 9.1 g/dL — AB (ref 13.0–17.0)
MCH: 28.8 pg (ref 26.0–34.0)
MCHC: 31.7 g/dL (ref 30.0–36.0)
MCV: 90.8 fL (ref 78.0–100.0)
Platelets: 427 10*3/uL — ABNORMAL HIGH (ref 150–400)
RBC: 3.16 MIL/uL — ABNORMAL LOW (ref 4.22–5.81)
RDW: 16.5 % — AB (ref 11.5–15.5)
WBC: 6.2 10*3/uL (ref 4.0–10.5)

## 2018-02-15 NOTE — Progress Notes (Signed)
  Oncology Nurse Navigator Documentation  Navigator Location: [facility] (02/15/18 1141) Referral date to RadOnc/MedOnc: 02/10/18 (02/15/18 1141) )Navigator Encounter Type: Telephone (02/15/18 1141) Telephone: Outgoing Call;Clinic/MDC Follow-up (02/15/18 1141)     Surgery Date: 11/14/17 (02/15/18 1141)           Treatment Initiated Date: 11/14/17 (02/15/18 1141)     Barriers/Navigation Needs: Coordination of Care (02/15/18 1141)  Attempted to call patient on three numbers listed on chart but no answer and no VM set up. I called daughter and left VM asking her to call me back to f/u on discussion patient had with Dr. Burr Medico about potentially starting Xeloda.  Awaiting return call.              Acuity: Level 2 (02/15/18 1141)   Acuity Level 2: Ongoing guidance and education throughout treatment as needed (02/15/18 1141)     Time Spent with Patient: 15 (02/15/18 1141)

## 2018-02-15 NOTE — ED Notes (Signed)
LUQ colostomy back area clean and new bags apply.

## 2018-02-15 NOTE — Care Management Note (Addendum)
Case Management Note  CM consulted after Roberto Bern, Roberto Owen noted that multiple people within [facility] have been trying to reach the pt but unable to.  CM contacted Roberto Shell, Roberto Owen and Roberto Snipe, Roberto Owen with oncology and left VMs advising them that pt was in the ED if they wanted to speak with him while he was here.    CM spoke with pt who states his phone number has changed to [phone] but that he doesn't really answer the phone, especially if it isn't near by.  He said people who need him "just come to the house and come on in."  Pt additionally states all of his family is out of town and someone else told him to call 911 since he couldn't get in touch with family.  Roberto Killings, Roberto Owen.  No further CM needs noted at this time.

## 2018-02-15 NOTE — ED Triage Notes (Addendum)
Per EMS. Pt from home. Pt reports his colostomy bag has not been working for the past 2 days. Stool has not been going into bag. Otherwise complains of skin irritation around colostomy. Also out of colostomy bags. Pt reports he had a surgical procedure a month ago to try to correct this issue, but now has reoccurrence of the same problem.

## 2018-02-15 NOTE — ED Notes (Signed)
Bed: WTR7 Expected date:  Expected time:  Means of arrival:  Comments: EMS-leaky colostomy bag-triage

## 2018-02-15 NOTE — Progress Notes (Signed)
  Oncology Nurse Navigator Documentation  Navigator Location: [facility] (02/15/18 1612)  Received phone call from CM at [facility] ED letting me know that Mr. Roberto Owen was in Triage Room 7. I met with patient and he has been having trouble with his ostomy appliance leaking and has run out of bags. I let Mr. Roberto Owen know that he can purchase his bags and wafers at [facility] on [address]. I will speak with Dr. Burr Medico and see if patient can be referred to [facility] for help with managing appliance. Patient stated that the [phone] phone number is his cell number and the best number to reach him. I also gave Mr. Roberto Owen my phone number if he needs additional assistance. )Navigator Encounter Type: Other (02/15/18 1612)  Telephone: [facility] ED) (02/15/18 1612)                       Barriers/Navigation Needs: Education (02/15/18 1612) Education: Other(Issues with Ostomy) (02/15/18 1612) Interventions: Education (02/15/18 1612)     Education Method: Verbal (02/15/18 1612)      Acuity: Level 2 (02/15/18 1612)         Time Spent with Patient: 30 (02/15/18 1612)

## 2018-02-15 NOTE — Discharge Instructions (Signed)
Call the [facility] and speak with Edwyna Shell or [facility] to schedule follow up.

## 2018-02-15 NOTE — Consult Note (Signed)
[facility] Nurse ostomy consult note: Called to Ed to replace ostomy pouch with is leaking from both the 3 and 9 o'clock positions.  Stoma type/location: LMQ colostomy Stomal assessment/size: oblong stoma at skin level and in a crease. 7/8 inch x 1 and 1/8 inch Peristomal assessment: shallow, open and moist Treatment options for stomal/peristomal skin: stoma powder dusted over denuded area(s), brush away excess. Skin barrier ring.  Flat pouch applied with pressure applied into creases Output: brown stool Ostomy pouching: 1pc.flat pouch with skin barrier ring Education provided: Patient and granddaughter instructed on flat pouch, use of skin barrier ring, also use of stoma powder.  3 pouches, 5 rings and a bottle of ostomy powder sent home with patient. Enrolled patient in [place] program: No  WOC nursing team will not follow, but will remain available to this patient, the nursing and medical teams.  Please re-consult if needed. Thanks, Maudie Flakes, MSN, RN, [place], Arther Abbott  Pager# [phone]

## 2018-02-15 NOTE — ED Provider Notes (Signed)
[facility] DEPT Provider Note   CSN: [id number] Arrival date & time: 02/15/18  1401     History   Chief Complaint Chief Complaint  Patient presents with  . colostomy leaking    HPI Roberto Owen is a 83 y.o. male who presents to the ED via EMS for leaking colostomy. The problem started 2 days ago and has gotten worse. Patient reports stool has not been going into the bag but coming out around it. Skin around colostomy irritated.  Patient states he is visiting his children and is from out of [place] and wants to get back home as soon as his problems are taken care of. Patient was expecting home care but in review of chart the social worker has been trying to get in touch with the patient to schedule home care and they have not been able to reach him. Patient reports that they have the wrong phone number.   HPI  Past Medical History:  Diagnosis Date  . Anemia   . Bladder diverticulum 11/10/2017  . Esophageal ulcer    hx/notes 09/15/2017  . Gastric ulcer    hx/notes 09/15/2017  . GI bleed   . History of blood transfusion 07/2017  . NSAID induced gastritis     Patient Active Problem List   Diagnosis Date Noted  . Lower leg edema 02/02/2018  . Edema 01/26/2018  . Ulcer of heel ([place]) 01/26/2018  . Cancer of left colon ([place]) 12/06/2017  . Severe protein-calorie malnutrition ([place]) 11/11/2017  . Dehydration   . Hypoalbuminemia due to protein-calorie malnutrition ([place]) 11/10/2017  . Bladder diverticulum 11/10/2017  . Colonic obstruction ([place]) 11/10/2017  . Thrombocytosis ([place]) 11/10/2017  . Euthyroid sick syndrome 11/10/2017  . Hematuria, microscopic 11/10/2017  . Hiccups 11/10/2017  . Atrial fibrillation ([place])   . Loculated pleural effusion on Left, Possible   . Diverticulitis 11/08/2017  . Colonic fistula, History of, by fistulogram 09/30/2017  . Hyponatremia 09/16/2017  . Diverticulitis of large intestine with abscess 09/16/2017  . PUD  (peptic ulcer disease) 09/16/2017  . AKI (acute kidney injury) ([place]) 09/16/2017  . Diverticulitis of intestine with abscess 09/16/2017  . Orthostasis 09/16/2017  . Diverticular disease of intestine with perforation and abscess   . Acute gastric ulcer with hemorrhage   . Multiple gastric ulcers   . Anemia due to GI blood loss   . GI bleed 07/23/2017  . Microcytic anemia 07/23/2017    Past Surgical History:  Procedure Laterality Date  . COLONOSCOPY  ~ 05/2016  . ESOPHAGOGASTRODUODENOSCOPY (EGD) WITH PROPOFOL N/A 07/25/2017   Procedure: ESOPHAGOGASTRODUODENOSCOPY (EGD) WITH PROPOFOL;  Surgeon: Mauri Pole, MD;  Location: [facility];  Service: Endoscopy;  Laterality: N/A;  . IR CATHETER TUBE CHANGE  10/28/2017  . IR RADIOLOGIST EVAL & MGMT  09/30/2017  . IR RADIOLOGIST EVAL & MGMT  10/19/2017  . LAPAROTOMY N/A 11/14/2017   Procedure: EXPLORATORY LAPAROTOMY WITH HARTMANN PROCEDURE;  Surgeon: Clovis Riley, MD;  Location: [facility];  Service: General;  Laterality: N/A;       Home Medications    Prior to Admission medications   Medication Sig Start Date End Date Taking? Authorizing Provider  acetaminophen (TYLENOL) 500 MG tablet Take 1,000 mg by mouth every 6 (six) hours as needed for mild pain.   Yes [provider]  amiodarone (PACERONE) 200 MG tablet Take 1 tablet (200 mg total) by mouth daily. 11/26/17  Yes Nuala Alpha, DO  apixaban (ELIQUIS) 2.5 MG TABS tablet Take  1 tablet (2.5 mg total) by mouth 2 (two) times daily. 01/26/18  Yes Nuala Alpha, DO  ferrous sulfate 325 (65 FE) MG tablet Take 1 tablet (325 mg total) by mouth daily with breakfast. 07/27/17  Yes Tawny Asal, MD  furosemide (LASIX) 80 MG tablet Take 80 mg by mouth 2 (two) times daily. 01/15/18  Yes [provider]  metoprolol tartrate (LOPRESSOR) 25 MG tablet Take 0.5 tablets (12.5 mg total) by mouth 2 (two) times daily. 11/26/17  Yes Lockamy, Timothy, DO  pantoprazole (PROTONIX) 40 MG  tablet TAKE 1 TABLET BY MOUTH TWICE DAILY 09/10/17  Yes Lorella Nimrod, MD  Simethicone 180 MG CAPS Take 1 capsule every 4 (four) hours as needed by mouth (for gas, bloating).   Yes [provider]    Family History Family History  Problem Relation Age of Onset  . Arthritis Mother   . Arthritis Father   . Heart disease Neg Hx   . Kidney disease Neg Hx   . Diabetes Neg Hx     Social History Social History   Tobacco Use  . Smoking status: Never Smoker  . Smokeless tobacco: Never Used  Substance Use Topics  . Alcohol use: No  . Drug use: No     Allergies   Patient has no known allergies.   Review of Systems Review of Systems  Skin: Positive for wound.  All other systems reviewed and are negative.    Physical Exam Updated Vital Signs BP 140/66 (BP Location: Left Arm)   Pulse 73   Temp 98.1 F (36.7 C) (Oral)   Resp 16   SpO2 100%   Physical Exam  Constitutional: He appears well-developed and well-nourished. No distress.  HENT:  Head: Normocephalic.  Eyes: EOM are normal.  Neck: Neck supple.  Cardiovascular: Normal rate.  Pulmonary/Chest: Effort normal.  Abdominal: There is no tenderness.  Ostomy bag leaking. The skin surrounding the ostomy is irritated and has small amount of blood oozing. No red streaking or signs of infection.   Musculoskeletal: Normal range of motion.  Neurological: He is alert.  Skin: Skin is warm and dry.  Nursing note and vitals reviewed.    ED Treatments / Results  Labs (all labs ordered are listed, but only abnormal results are displayed) Labs Reviewed  CBC - Abnormal; Notable for the following components:      Result Value   RBC 3.16 (*)    Hemoglobin 9.1 (*)    HCT 28.7 (*)    RDW 16.5 (*)    Platelets 427 (*)    All other components within normal limits  BASIC METABOLIC PANEL - Abnormal; Notable for the following components:   Potassium 3.3 (*)    BUN 30 (*)    Creatinine, Ser 1.53 (*)    Calcium 8.7 (*)     GFR calc non Af Amer 41 (*)    GFR calc Af Amer 48 (*)    All other components within normal limits   Case manager her to speak with the patient and try to contact the social worker that has been trying to reach the patient.  Radiology No results found.  Procedures Procedures (including critical care time)  Medications Ordered in ED Medications - No data to display   Initial Impression / Assessment and Plan / ED Course  I have reviewed the triage vital signs and the nursing notes. 83 y.o. male here for leaking colostomy bag. I have reviewed labs and patient has been evaluated by  DR. Ralene Bathe and the Ostomy RN. Stable for d/c with family member. Patient to call the social worker to arrange home care. He will return here as needed.  Final Clinical Impressions(s) / ED Diagnoses   Final diagnoses:  Colostomy dysfunction [facility])    ED Discharge Orders    None       Debroah Baller [place], [place] 02/15/18 2307    Quintella Reichert, MD 02/20/18 [phone]

## 2018-02-16 ENCOUNTER — Other Ambulatory Visit: Payer: Medicare Other

## 2018-02-16 ENCOUNTER — Encounter: Payer: Self-pay | Admitting: General Practice

## 2018-02-16 ENCOUNTER — Encounter: Payer: Self-pay | Admitting: Nutrition

## 2018-02-16 ENCOUNTER — Encounter: Payer: Medicare Other | Admitting: Nutrition

## 2018-02-16 NOTE — Progress Notes (Signed)
[facility] CSW Progress Note  CSW received referral from APP that patient requested social work assistance.  CSW has attempted contact and been unable to reach patient.  Noted new phone # in chart, called [phone] - no answer, VM not set up.  Unable to leave message.  Edwyna Shell, LCSW Clinical Social Worker Phone:  [phone]

## 2018-02-16 NOTE — Progress Notes (Signed)
Patient was scheduled for nutrition appointment after labs today. Patient did not show.

## 2018-02-17 DIAGNOSIS — Z7901 Long term (current) use of anticoagulants: Secondary | ICD-10-CM | POA: Diagnosis not present

## 2018-02-17 DIAGNOSIS — I482 Chronic atrial fibrillation: Secondary | ICD-10-CM | POA: Diagnosis not present

## 2018-02-17 DIAGNOSIS — C189 Malignant neoplasm of colon, unspecified: Secondary | ICD-10-CM | POA: Diagnosis not present

## 2018-02-17 DIAGNOSIS — I10 Essential (primary) hypertension: Secondary | ICD-10-CM | POA: Diagnosis not present

## 2018-02-17 DIAGNOSIS — Z433 Encounter for attention to colostomy: Secondary | ICD-10-CM | POA: Diagnosis not present

## 2018-02-17 NOTE — Progress Notes (Signed)
  Oncology Nurse Navigator Documentation  Navigator Location: [facility] (02/17/18 1339)   )Navigator Encounter Type: Telephone (02/17/18 1339) Telephone: Outgoing Call (02/17/18 1339)  Called pt after speaking with Dr. Burr Medico to relay information. I explained to patient that it is past the window to receive chemo-therapy and that Dr. Burr Medico recommends a repeat ct scan in the nex few months. Scheduling will call patient with time and date.                                      Acuity: Level 2 (02/17/18 1339)         Time Spent with Patient: 15 (02/17/18 1339)

## 2018-02-17 NOTE — Addendum Note (Signed)
Addended by: Truitt Merle on: 02/17/2018 12:54 PM   Modules accepted: Orders

## 2018-02-18 ENCOUNTER — Telehealth: Payer: Self-pay | Admitting: Hematology

## 2018-02-18 NOTE — Telephone Encounter (Signed)
Appointments scheduled Calendar printed and mailed to patient per 2/28 sch msg

## 2018-02-22 ENCOUNTER — Telehealth: Payer: Self-pay | Admitting: Family Medicine

## 2018-02-22 ENCOUNTER — Other Ambulatory Visit: Payer: Self-pay

## 2018-02-22 MED ORDER — APIXABAN 2.5 MG PO TABS: 2.5000 mg | ORAL_TABLET | Freq: Two times a day (BID) | ORAL | 0 refills | Status: DC

## 2018-02-22 MED ORDER — AMIODARONE HCL 200 MG PO TABS: 200.0000 mg | ORAL_TABLET | Freq: Every day | ORAL | 0 refills | Status: DC

## 2018-02-22 MED ORDER — FUROSEMIDE 80 MG PO TABS: 80.0000 mg | ORAL_TABLET | Freq: Two times a day (BID) | ORAL | 2 refills | Status: DC

## 2018-02-22 MED ORDER — PANTOPRAZOLE SODIUM 40 MG PO TBEC: 40.0000 mg | DELAYED_RELEASE_TABLET | Freq: Every day | ORAL | 2 refills | Status: DC

## 2018-02-22 NOTE — Telephone Encounter (Signed)
Patient needs authorizations and/or refills on the Amiodarone, Lasix, Eliquis, and Protonix.  Send to [facility] on [address].

## 2018-02-24 DIAGNOSIS — C189 Malignant neoplasm of colon, unspecified: Secondary | ICD-10-CM | POA: Diagnosis not present

## 2018-02-24 DIAGNOSIS — Z7901 Long term (current) use of anticoagulants: Secondary | ICD-10-CM | POA: Diagnosis not present

## 2018-02-24 DIAGNOSIS — I10 Essential (primary) hypertension: Secondary | ICD-10-CM | POA: Diagnosis not present

## 2018-02-24 DIAGNOSIS — Z433 Encounter for attention to colostomy: Secondary | ICD-10-CM | POA: Diagnosis not present

## 2018-02-24 DIAGNOSIS — I482 Chronic atrial fibrillation: Secondary | ICD-10-CM | POA: Diagnosis not present

## 2018-02-25 DIAGNOSIS — Z433 Encounter for attention to colostomy: Secondary | ICD-10-CM | POA: Diagnosis not present

## 2018-02-25 DIAGNOSIS — C189 Malignant neoplasm of colon, unspecified: Secondary | ICD-10-CM | POA: Diagnosis not present

## 2018-02-25 DIAGNOSIS — Z7901 Long term (current) use of anticoagulants: Secondary | ICD-10-CM | POA: Diagnosis not present

## 2018-02-25 DIAGNOSIS — I482 Chronic atrial fibrillation: Secondary | ICD-10-CM | POA: Diagnosis not present

## 2018-02-25 DIAGNOSIS — I10 Essential (primary) hypertension: Secondary | ICD-10-CM | POA: Diagnosis not present

## 2018-02-27 DIAGNOSIS — C189 Malignant neoplasm of colon, unspecified: Secondary | ICD-10-CM | POA: Diagnosis not present

## 2018-02-27 DIAGNOSIS — I10 Essential (primary) hypertension: Secondary | ICD-10-CM | POA: Diagnosis not present

## 2018-02-27 DIAGNOSIS — Z7901 Long term (current) use of anticoagulants: Secondary | ICD-10-CM | POA: Diagnosis not present

## 2018-02-27 DIAGNOSIS — Z433 Encounter for attention to colostomy: Secondary | ICD-10-CM | POA: Diagnosis not present

## 2018-02-27 DIAGNOSIS — I482 Chronic atrial fibrillation: Secondary | ICD-10-CM | POA: Diagnosis not present

## 2018-03-01 DIAGNOSIS — I482 Chronic atrial fibrillation: Secondary | ICD-10-CM | POA: Diagnosis not present

## 2018-03-01 DIAGNOSIS — C189 Malignant neoplasm of colon, unspecified: Secondary | ICD-10-CM | POA: Diagnosis not present

## 2018-03-01 DIAGNOSIS — Z7901 Long term (current) use of anticoagulants: Secondary | ICD-10-CM | POA: Diagnosis not present

## 2018-03-01 DIAGNOSIS — Z433 Encounter for attention to colostomy: Secondary | ICD-10-CM | POA: Diagnosis not present

## 2018-03-01 DIAGNOSIS — I10 Essential (primary) hypertension: Secondary | ICD-10-CM | POA: Diagnosis not present

## 2018-03-04 ENCOUNTER — Other Ambulatory Visit: Payer: Self-pay | Admitting: Family Medicine

## 2018-03-04 ENCOUNTER — Telehealth: Payer: Self-pay

## 2018-03-04 DIAGNOSIS — I10 Essential (primary) hypertension: Secondary | ICD-10-CM | POA: Diagnosis not present

## 2018-03-04 DIAGNOSIS — Z433 Encounter for attention to colostomy: Secondary | ICD-10-CM | POA: Diagnosis not present

## 2018-03-04 DIAGNOSIS — I482 Chronic atrial fibrillation: Secondary | ICD-10-CM | POA: Diagnosis not present

## 2018-03-04 DIAGNOSIS — L97409 Non-pressure chronic ulcer of unspecified heel and midfoot with unspecified severity: Secondary | ICD-10-CM

## 2018-03-04 DIAGNOSIS — Z7901 Long term (current) use of anticoagulants: Secondary | ICD-10-CM | POA: Diagnosis not present

## 2018-03-04 DIAGNOSIS — C189 Malignant neoplasm of colon, unspecified: Secondary | ICD-10-CM | POA: Diagnosis not present

## 2018-03-04 NOTE — Telephone Encounter (Signed)
Caryl Pina- RN with [facility] called regarding patient. Did not leave any further information. Attempted to call back. Left voicemail for her to call back and ok to leave a message. Wallace Cullens, RN

## 2018-03-04 NOTE — Telephone Encounter (Signed)
Caryl Pina RN with [facility] called back regarding patients. States during her [facility] visit she noted bilateral lower leg swelling. Pt states he had been out of lasix but did restart lasix on Monday. Also noted bilateral heel wounds- unable to stage. Requesting orders for wound care. Ashley's call back [phone] Wallace Cullens, RN

## 2018-03-04 NOTE — Progress Notes (Signed)
Received a message request by RN for home health orders for wound care due to bilateral heel ulcers (not staged).  Entering in orders so patient can get [facility] wound care service.

## 2018-03-07 DIAGNOSIS — I10 Essential (primary) hypertension: Secondary | ICD-10-CM | POA: Diagnosis not present

## 2018-03-07 DIAGNOSIS — Z433 Encounter for attention to colostomy: Secondary | ICD-10-CM | POA: Diagnosis not present

## 2018-03-07 DIAGNOSIS — Z7901 Long term (current) use of anticoagulants: Secondary | ICD-10-CM | POA: Diagnosis not present

## 2018-03-07 DIAGNOSIS — C189 Malignant neoplasm of colon, unspecified: Secondary | ICD-10-CM | POA: Diagnosis not present

## 2018-03-07 DIAGNOSIS — I482 Chronic atrial fibrillation: Secondary | ICD-10-CM | POA: Diagnosis not present

## 2018-03-09 DIAGNOSIS — I482 Chronic atrial fibrillation: Secondary | ICD-10-CM | POA: Diagnosis not present

## 2018-03-09 DIAGNOSIS — I10 Essential (primary) hypertension: Secondary | ICD-10-CM | POA: Diagnosis not present

## 2018-03-09 DIAGNOSIS — Z7901 Long term (current) use of anticoagulants: Secondary | ICD-10-CM | POA: Diagnosis not present

## 2018-03-09 DIAGNOSIS — C189 Malignant neoplasm of colon, unspecified: Secondary | ICD-10-CM | POA: Diagnosis not present

## 2018-03-09 DIAGNOSIS — Z433 Encounter for attention to colostomy: Secondary | ICD-10-CM | POA: Diagnosis not present

## 2018-03-10 ENCOUNTER — Other Ambulatory Visit: Payer: Self-pay

## 2018-03-10 ENCOUNTER — Emergency Department (HOSPITAL_COMMUNITY)
Admission: EM | Admit: 2018-03-10 | Discharge: 2018-03-11 | Disposition: A | Discharge status: Home / Self Care | Payer: Medicare Other | Attending: Emergency Medicine | Admitting: Emergency Medicine

## 2018-03-10 ENCOUNTER — Emergency Department (HOSPITAL_COMMUNITY): Payer: Medicare Other

## 2018-03-10 ENCOUNTER — Encounter (HOSPITAL_COMMUNITY): Payer: Self-pay

## 2018-03-10 DIAGNOSIS — C189 Malignant neoplasm of colon, unspecified: Secondary | ICD-10-CM | POA: Diagnosis not present

## 2018-03-10 DIAGNOSIS — R1032 Left lower quadrant pain: Secondary | ICD-10-CM | POA: Diagnosis not present

## 2018-03-10 DIAGNOSIS — Z79899 Other long term (current) drug therapy: Secondary | ICD-10-CM | POA: Diagnosis not present

## 2018-03-10 DIAGNOSIS — Z433 Encounter for attention to colostomy: Secondary | ICD-10-CM | POA: Insufficient documentation

## 2018-03-10 DIAGNOSIS — R109 Unspecified abdominal pain: Secondary | ICD-10-CM | POA: Diagnosis not present

## 2018-03-10 DIAGNOSIS — K9403 Colostomy malfunction: Secondary | ICD-10-CM | POA: Diagnosis not present

## 2018-03-10 DIAGNOSIS — Z7901 Long term (current) use of anticoagulants: Secondary | ICD-10-CM | POA: Diagnosis not present

## 2018-03-10 DIAGNOSIS — N390 Urinary tract infection, site not specified: Secondary | ICD-10-CM | POA: Diagnosis not present

## 2018-03-10 LAB — URINALYSIS, ROUTINE W REFLEX MICROSCOPIC
Bilirubin Urine: NEGATIVE
Glucose, UA: NEGATIVE mg/dL
Ketones, ur: NEGATIVE mg/dL
Nitrite: NEGATIVE
PROTEIN: NEGATIVE mg/dL
Specific Gravity, Urine: 1.015 (ref 1.005–1.030)
pH: 5 (ref 5.0–8.0)

## 2018-03-10 LAB — CBC WITH DIFFERENTIAL/PLATELET
BASOS ABS: 0 10*3/uL (ref 0.0–0.1)
Basophils Relative: 0 %
EOS PCT: 2 %
Eosinophils Absolute: 0.1 10*3/uL (ref 0.0–0.7)
HEMATOCRIT: 28.1 % — AB (ref 39.0–52.0)
Hemoglobin: 8.7 g/dL — ABNORMAL LOW (ref 13.0–17.0)
LYMPHS ABS: 1.3 10*3/uL (ref 0.7–4.0)
LYMPHS PCT: 21 %
MCH: 26.8 pg (ref 26.0–34.0)
MCHC: 31 g/dL (ref 30.0–36.0)
MCV: 86.5 fL (ref 78.0–100.0)
MONO ABS: 0.4 10*3/uL (ref 0.1–1.0)
Monocytes Relative: 7 %
NEUTROS ABS: 4.1 10*3/uL (ref 1.7–7.7)
Neutrophils Relative %: 70 %
Platelets: 336 10*3/uL (ref 150–400)
RBC: 3.25 MIL/uL — AB (ref 4.22–5.81)
RDW: 13.8 % (ref 11.5–15.5)
WBC: 6 10*3/uL (ref 4.0–10.5)

## 2018-03-10 LAB — COMPREHENSIVE METABOLIC PANEL
ALT: 8 U/L — AB (ref 17–63)
AST: 18 U/L (ref 15–41)
Albumin: 2.7 g/dL — ABNORMAL LOW (ref 3.5–5.0)
Alkaline Phosphatase: 74 U/L (ref 38–126)
Anion gap: 7 (ref 5–15)
BILIRUBIN TOTAL: 0.4 mg/dL (ref 0.3–1.2)
BUN: 31 mg/dL — AB (ref 6–20)
CALCIUM: 8.3 mg/dL — AB (ref 8.9–10.3)
CO2: 28 mmol/L (ref 22–32)
CREATININE: 1.72 mg/dL — AB (ref 0.61–1.24)
Chloride: 102 mmol/L (ref 101–111)
GFR calc non Af Amer: 36 mL/min — ABNORMAL LOW (ref >60)
GFR, EST AFRICAN AMERICAN: 41 mL/min — AB (ref >60)
Glucose, Bld: 114 mg/dL — ABNORMAL HIGH (ref 65–99)
Potassium: 3.6 mmol/L (ref 3.5–5.1)
Sodium: 137 mmol/L (ref 135–145)
TOTAL PROTEIN: 7.8 g/dL (ref 6.5–8.1)

## 2018-03-10 LAB — I-STAT CG4 LACTIC ACID, ED: LACTIC ACID, VENOUS: 1.17 mmol/L (ref 0.5–1.9)

## 2018-03-10 MED ORDER — SODIUM CHLORIDE 0.9 % IV SOLN
1.0000 g | Freq: Once | INTRAVENOUS | Status: AC
  Administered 2018-03-10: 1 g via INTRAVENOUS
  Filled 2018-03-10: qty 10

## 2018-03-10 MED ORDER — IOPAMIDOL (ISOVUE-300) INJECTION 61%
INTRAVENOUS | Status: AC
  Administered 2018-03-10: 100 mL
  Filled 2018-03-10: qty 100

## 2018-03-10 MED ORDER — CEPHALEXIN 500 MG PO CAPS: 500.0000 mg | ORAL_CAPSULE | Freq: Two times a day (BID) | ORAL | 0 refills | Status: DC

## 2018-03-10 NOTE — ED Notes (Signed)
Pt given [place] sandwich to eat with a coke while we wait for his UA results. Pt understanding of delay.

## 2018-03-10 NOTE — ED Notes (Signed)
UA is running at this time per main lab. Delay due to downtime

## 2018-03-10 NOTE — ED Provider Notes (Signed)
[facility] EMERGENCY DEPARTMENT Provider Note   CSN: [id number] Arrival date & time: 03/10/18  1541     History   Chief Complaint No chief complaint on file.   HPI Roberto Owen is a 83 y.o. male.  HPI  Patient with ostomy placed several months ago presents with leakage around the ostomy site as well as pain.  He states he has had chronic issues with the ostomy leaking and is been seen by the ostomy nurse and several different sizes have been tried without much help.  Yesterday he began to have a deeper abdominal pain associated with the ostomy site.  No change in stool output.  No blood in stool.  No vomiting.  No fever.  No surrounding redness at the ostomy site.  There are no other associated systemic symptoms, there are no other alleviating or modifying factors.   Past Medical History:  Diagnosis Date  . Anemia   . Bladder diverticulum 11/10/2017  . Esophageal ulcer    hx/notes 09/15/2017  . Gastric ulcer    hx/notes 09/15/2017  . GI bleed   . History of blood transfusion 07/2017  . NSAID induced gastritis     Patient Active Problem List   Diagnosis Date Noted  . Lower leg edema 02/02/2018  . Edema 01/26/2018  . Ulcer of heel ([place]) 01/26/2018  . Cancer of left colon ([place]) 12/06/2017  . Severe protein-calorie malnutrition ([place]) 11/11/2017  . Dehydration   . Hypoalbuminemia due to protein-calorie malnutrition ([place]) 11/10/2017  . Bladder diverticulum 11/10/2017  . Colonic obstruction ([place]) 11/10/2017  . Thrombocytosis ([place]) 11/10/2017  . Euthyroid sick syndrome 11/10/2017  . Hematuria, microscopic 11/10/2017  . Hiccups 11/10/2017  . Atrial fibrillation ([place])   . Loculated pleural effusion on Left, Possible   . Diverticulitis 11/08/2017  . Colonic fistula, History of, by fistulogram 09/30/2017  . Hyponatremia 09/16/2017  . Diverticulitis of large intestine with abscess 09/16/2017  . PUD (peptic ulcer disease) 09/16/2017  . AKI (acute kidney  injury) ([place]) 09/16/2017  . Diverticulitis of intestine with abscess 09/16/2017  . Orthostasis 09/16/2017  . Diverticular disease of intestine with perforation and abscess   . Acute gastric ulcer with hemorrhage   . Multiple gastric ulcers   . Anemia due to GI blood loss   . GI bleed 07/23/2017  . Microcytic anemia 07/23/2017    Past Surgical History:  Procedure Laterality Date  . COLONOSCOPY  ~ 05/2016  . ESOPHAGOGASTRODUODENOSCOPY (EGD) WITH PROPOFOL N/A 07/25/2017   Procedure: ESOPHAGOGASTRODUODENOSCOPY (EGD) WITH PROPOFOL;  Surgeon: Mauri Pole, MD;  Location: [facility];  Service: Endoscopy;  Laterality: N/A;  . IR CATHETER TUBE CHANGE  10/28/2017  . IR RADIOLOGIST EVAL & MGMT  09/30/2017  . IR RADIOLOGIST EVAL & MGMT  10/19/2017  . LAPAROTOMY N/A 11/14/2017   Procedure: EXPLORATORY LAPAROTOMY WITH HARTMANN PROCEDURE;  Surgeon: Clovis Riley, MD;  Location: [facility] OR;  Service: General;  Laterality: N/A;       Home Medications    Prior to Admission medications   Medication Sig Start Date End Date Taking? Authorizing Provider  acetaminophen (TYLENOL) 500 MG tablet Take 1,000 mg by mouth every 6 (six) hours as needed for mild pain.    [provider]  amiodarone (PACERONE) 200 MG tablet Take 1 tablet (200 mg total) by mouth daily. 02/22/18   Nuala Alpha, DO  apixaban (ELIQUIS) 2.5 MG TABS tablet Take 1 tablet (2.5 mg total) by mouth 2 (two) times daily. 02/22/18  Nuala Alpha, DO  ferrous sulfate 325 (65 FE) MG tablet Take 1 tablet (325 mg total) by mouth daily with breakfast. 07/27/17   Tawny Asal, MD  furosemide (LASIX) 80 MG tablet Take 1 tablet (80 mg total) by mouth 2 (two) times daily. 02/22/18   Nuala Alpha, DO  metoprolol tartrate (LOPRESSOR) 25 MG tablet Take 0.5 tablets (12.5 mg total) by mouth 2 (two) times daily. 11/26/17   Nuala Alpha, DO  pantoprazole (PROTONIX) 40 MG tablet Take 1 tablet (40 mg total) by mouth daily. 02/22/18   Nuala Alpha, DO  Simethicone 180 MG CAPS Take 1 capsule every 4 (four) hours as needed by mouth (for gas, bloating).    [provider]    Family History Family History  Problem Relation Age of Onset  . Arthritis Mother   . Arthritis Father   . Heart disease Neg Hx   . Kidney disease Neg Hx   . Diabetes Neg Hx     Social History Social History   Tobacco Use  . Smoking status: Never Smoker  . Smokeless tobacco: Never Used  Substance Use Topics  . Alcohol use: No  . Drug use: No     Allergies   Patient has no known allergies.   Review of Systems Review of Systems  ROS reviewed and all otherwise negative except for mentioned in HPI   Physical Exam Updated Vital Signs BP 117/70   Pulse 62   Temp 97.8 F (36.6 C) (Oral)   Resp 14   Wt 102.1 kg (225 lb)   SpO2 100%   BMI 26.00 kg/m  Vitals reviewed Physical Exam  Physical Examination: General appearance - alert, well appearing, and in no distress Mental status - alert, oriented to person, place, and time Eyes -no conjunctival injection, no scleral icterus Chest - clear to auscultation, no wheezes, rales or rhonchi, symmetric air entry Heart - normal rate, regular rhythm, normal S1, S2, no murmurs, rubs, clicks or gallops Abdomen - soft, ostomy in left lower abdomen, soft stool in bag, no surrounding erythema of skin, tenderness to deep palpation of left lower abdomen, no gaurding or rebound, nondistended, no masses or organomegaly Neurological - alert, oriented, normal speech Extremities - peripheral pulses normal, no pedal edema, no clubbing or cyanosis Skin - normal coloration and turgor, no rashes   ED Treatments / Results  Labs (all labs ordered are listed, but only abnormal results are displayed) Labs Reviewed  CBC WITH DIFFERENTIAL/PLATELET - Abnormal; Notable for the following components:      Result Value   RBC 3.25 (*)    Hemoglobin 8.7 (*)    HCT 28.1 (*)    All other components within  normal limits  COMPREHENSIVE METABOLIC PANEL - Abnormal; Notable for the following components:   Glucose, Bld 114 (*)    BUN 31 (*)    Creatinine, Ser 1.72 (*)    Calcium 8.3 (*)    Albumin 2.7 (*)    ALT 8 (*)    GFR calc non Af Amer 36 (*)    GFR calc Af Amer 41 (*)    All other components within normal limits  URINALYSIS, ROUTINE W REFLEX MICROSCOPIC  I-STAT CG4 LACTIC ACID, ED    EKG  EKG Interpretation None       Radiology Ct Abdomen Pelvis W Contrast  Result Date: 03/10/2018 CLINICAL DATA:  Acute abdominal pain. Patient reports leaking from colostomy bag. EXAM: CT ABDOMEN AND PELVIS WITH CONTRAST TECHNIQUE: Multidetector CT  imaging of the abdomen and pelvis was performed using the standard protocol following bolus administration of intravenous contrast. CONTRAST:  145mL ISOVUE-300 IOPAMIDOL (ISOVUE-300) INJECTION 61% COMPARISON:  CT 11/14/2017, additional priors FINDINGS: Lower chest: Streaky left lung base opacities likely scarring giving prior pigtail catheter. No pleural fluid or consolidation. Bilateral gynecomastia. Chronic pericardial calcification about the right atrium. Hepatobiliary: Scattered hepatic granuloma. Ill-defined 16 mm low-density within segment 7/8, not definitively seen on prior exam. There is a second questionable lesion slightly more centrally measuring 19 mm. Gallbladder partially distended. No biliary dilatation. Pancreas: No ductal dilatation or inflammation. Spleen: Punctate granuloma.  Normal in size. Adrenals/Urinary Tract: Normal adrenal glands. No hydronephrosis. Homogeneous renal enhancement. Mild symmetric perinephric edema is likely chronic. Absent renal excretion on delayed phase imaging. Urinary bladder is partially distended. Right posterior bladder diverticulum contains dependent hyperdensity likely a stone. Equivocal bladder wall thickening. Stomach/Bowel: Descending colostomy in the left lower abdomen without parastomal hernia. Small to moderate  stool in the more proximal colon. Stapled off sigmoid colon in the pelvis without acute finding. No bowel inflammation or obstruction. Stomach physiologically distended. Normal appendix. Vascular/Lymphatic: Aorta bi-iliac atherosclerosis. Small retroperitoneal nodes are likely reactive. Reproductive: Prostate is unremarkable. Other: Small ovoid hyperdense 2.3 x 1.4 cm structure in the dependent pelvis without surrounding inflammatory change. No ascites. No free air. No intra-abdominal fluid collection. Postsurgical change of the anterior abdominal wall. Mild body wall edema with improvement from prior CT. Musculoskeletal: Degenerative change throughout the lumbar spine. There are no acute or suspicious osseous abnormalities. IMPRESSION: 1. Mild urinary bladder wall thickening which may be chronic, or represent cystitis. Chronic posterior right bladder diverticulum containing hyperdense material, which may stone or complex material. 2. Two questionable low-density lesions in the right lobe of the liver, 16 and19 mm, not seen on prior exam. Borders are ill-defined, and this may simply represent focal fatty infiltration, however underlying hepatic lesion is also considered. Recommend further evaluation with MRI. Consider elective MRI after resolution of acute event, patient must be able to tolerate breath hold technique. 3. Well-defined small ovoid hyperdensity in the dependent pelvis, not seen on prior exams. This is likely sequela of prior surgery and chronic hematoma. No surrounding inflammation. 4. Absent renal excretion on delayed phase imaging consistent with diminished renal function. 5. Left lower quadrant colostomy without parastomal hernia. 6.  Aortic Atherosclerosis (ICD10-I70.0). Electronically Signed   By: Jeb Levering M.D.   On: 03/10/2018 21:33    Procedures Procedures (including critical care time)  Medications Ordered in ED Medications  iopamidol (ISOVUE-300) 61 % injection (100 mLs   Contrast Given 03/10/18 2051)     Initial Impression / Assessment and Plan / ED Course  I have reviewed the triage vital signs and the nursing notes.  Pertinent labs & imaging results that were available during my care of the patient were reviewed by me and considered in my medical decision making (see chart for details).     Patient presenting with leakage at ostomy site.  He also complains of development of deep abdominal pain yesterday.  Labs are reassuring.  Ostomy will be changed in the ED by nursing staff.  Abdominal CT shows no acute findings in the area of the patient's pain however does show some thickening of the bladder that may be cystitis so urinalysis will need to be checked.  Patient advised to follow-up with primary doctor and with surgery.  Patient will be stable for outpatient management after urinalysis checked and treated with antibiotics if indicated.  Patient signed out at end of shift.  Final Clinical Impressions(s) / ED Diagnoses   Final diagnoses:  Left lower quadrant pain  Colostomy dysfunction [facility])    ED Discharge Orders    None       Pixie Casino, MD 03/10/18 2146

## 2018-03-10 NOTE — ED Notes (Signed)
Colostomy cleaned with warm water and washcloths. Colostomy bag replaced and secured onto patient.

## 2018-03-10 NOTE — ED Provider Notes (Signed)
UA is consistent with UTI.  Will treat with rocephin in ED and DC with keflex.   Montine Circle, PA-C 03/10/18 2347    Pixie Casino, MD 03/11/18 [phone]

## 2018-03-10 NOTE — ED Provider Notes (Signed)
Patient placed in Quick Look pathway, seen and evaluated   Chief Complaint: leaking colostomy bag, abdominal pain and pressure  HPI:   Patient presents with ongoing problems with leaking colostomy bag. He has a home nurse changing the bag and can also change it himself, but it appears that the bag won't seal for very long and contents leak. He also reports deep abdominal pain below the colostomy and noticing some blood in the bag.  ROS: denies fever, chills, nausea, vomiting  Physical Exam:   Gen: No distress  Neuro: Awake and Alert  Skin: Warm    Focused Exam: tender to palpation LLQ surrounding the colostomy bag. Otherwise well-appearing, nontoxic afebrile and very alert and talkative.   Initiation of care has begun. The patient has been counseled on the process, plan, and necessity for staying for the completion/evaluation, and the remainder of the medical screening examination    Dossie Der 03/10/18 1652    Long, Wonda [place], MD 03/10/18 [phone]

## 2018-03-10 NOTE — ED Triage Notes (Signed)
Pt presents to the ed with complaints of his colostomy bag leaking and pain at his site. No redness or swelling at the site.

## 2018-03-10 NOTE — ED Notes (Signed)
Patient transported to CT 

## 2018-03-10 NOTE — Discharge Instructions (Addendum)
Return to the ED with any concerns including vomiting, fever/chills, worsening abdominal pain, blood in stool, decreased level of alertness/lethargy, or any other alarming symptoms.  Your CT showed lesions on your liver.  Please discuss this finding with your primary care doctor; further investigation may be needed.

## 2018-03-10 NOTE — ED Notes (Signed)
Pt reports abd pain to site of colostomy. Green colored stool noted to be leaking from bag, towel placed underneath until bag can be changed. No redness or gross swelling noted at the site.

## 2018-03-10 NOTE — ED Notes (Signed)
A11 sent to materials management for colostomy bag.

## 2018-03-11 DIAGNOSIS — C189 Malignant neoplasm of colon, unspecified: Secondary | ICD-10-CM | POA: Diagnosis not present

## 2018-03-11 DIAGNOSIS — I10 Essential (primary) hypertension: Secondary | ICD-10-CM | POA: Diagnosis not present

## 2018-03-11 DIAGNOSIS — I482 Chronic atrial fibrillation: Secondary | ICD-10-CM | POA: Diagnosis not present

## 2018-03-11 DIAGNOSIS — Z7901 Long term (current) use of anticoagulants: Secondary | ICD-10-CM | POA: Diagnosis not present

## 2018-03-11 DIAGNOSIS — Z433 Encounter for attention to colostomy: Secondary | ICD-10-CM | POA: Diagnosis not present

## 2018-03-14 ENCOUNTER — Encounter: Payer: Self-pay | Admitting: Family Medicine

## 2018-03-14 ENCOUNTER — Ambulatory Visit (INDEPENDENT_AMBULATORY_CARE_PROVIDER_SITE_OTHER): Payer: Medicare Other | Admitting: Family Medicine

## 2018-03-14 VITALS — BP 120/70 | HR 69 | Temp 98.5°F | Ht 78.0 in | Wt 230.8 lb

## 2018-03-14 DIAGNOSIS — Z7901 Long term (current) use of anticoagulants: Secondary | ICD-10-CM | POA: Diagnosis not present

## 2018-03-14 DIAGNOSIS — R600 Localized edema: Secondary | ICD-10-CM | POA: Diagnosis present

## 2018-03-14 DIAGNOSIS — C189 Malignant neoplasm of colon, unspecified: Secondary | ICD-10-CM | POA: Diagnosis not present

## 2018-03-14 DIAGNOSIS — I480 Paroxysmal atrial fibrillation: Secondary | ICD-10-CM

## 2018-03-14 DIAGNOSIS — I482 Chronic atrial fibrillation: Secondary | ICD-10-CM | POA: Diagnosis not present

## 2018-03-14 DIAGNOSIS — I10 Essential (primary) hypertension: Secondary | ICD-10-CM | POA: Diagnosis not present

## 2018-03-14 DIAGNOSIS — Z433 Encounter for attention to colostomy: Secondary | ICD-10-CM | POA: Diagnosis not present

## 2018-03-14 NOTE — Progress Notes (Signed)
Subjective: Chief Complaint  Patient presents with  . stoma bag leaking     HPI: Roberto Owen is a 83 y.o. presenting to clinic today to discuss the following:  Discuss Medications Roberto Owen is an 83y/o male with a PMH of diverticulitis, colon cancer, Paroxysmal Atrial Fibrillation who presents today for follow up in management of his medical care and to get help with making sure he is taking the correct medications. He states he is unsure which medications he is supposed to be taking but he brought them with him.  Today he had Amiodarone, Apixaban, Metoprolol, Protonix, and Furosemide. We reviewed that he should still be taking all these medications as prescribed on his mediations list. We discussed how many to take per day and he is in agreement and expressed understanding to take them as listed.  Coordinate appointments/Stoma Bag leaking He states he has an upcoming appointment with his surgeon Dr. Kae Owen about his colostomy bag as it is still leaking quite a bit during the day. He states it is annoying him but otherwise, he is feeling good. He has tried to get a an appointment with Cardiology but has been unsuccessful. I wanted him to get follow up for management of his Atrial Fibrillation medications.   Bilteral LE Edema Improved since last visit. His feet and lower legs are noticably improved but still edematous. However, he can now wear his regular shoes where he could not before. He is still receiving home wound care for his bilateral heel uclers  Denies headache, chest pain, palpitations, SOB, difficulty breathing, cough, congestion, diarrhea, constipation, nausea, vomiting, burning or pain on urination  Endorses some abdominal pain in LUQ around where his colostomy bag is located.  Health Maintenance: none today     ROS noted in HPI.   Past Medical, Surgical, Social, and Family History Reviewed & Updated per EMR.   Pertinent Historical Findings  include:   Social History   Tobacco Use  Smoking Status Never Smoker  Smokeless Tobacco Never Used    Objective: BP 120/70 (BP Location: Left Arm, Patient Position: Sitting, Cuff Size: Normal)   Pulse 69   Temp 98.5 F (36.9 C) (Oral)   Ht 6\' 6"  (1.981 m)   Wt 230 lb 12.8 oz (104.7 kg)   SpO2 99%   BMI 26.67 kg/m  Vitals and nursing notes reviewed  Physical Exam  Constitutional: He is oriented to person, place, and time and well-developed, well-nourished, and in no distress. No distress.  HENT:  Head: Normocephalic and atraumatic.  Eyes: Pupils are equal, round, and reactive to light. EOM are normal.  Neck: Normal range of motion.  Cardiovascular: Normal rate, regular rhythm, normal heart sounds and intact distal pulses.  No murmur heard. Pulmonary/Chest: Effort normal and breath sounds normal. No respiratory distress. He has no wheezes. He has no rales.  Abdominal: Soft. Bowel sounds are normal. He exhibits no distension. There is no tenderness. There is no rebound.  Musculoskeletal: Normal range of motion. He exhibits edema.  Bilateral LE edema +3 up to the tibial plateau  Neurological: He is alert and oriented to person, place, and time.  Skin: Skin is warm and dry.     No results found for this or any previous visit (from the past 72 hour(s)).  Assessment/Plan:  Lower leg edema Improved but still has LE edema. Patient is ambulating and will continue to elevate legs at night.   Cont 80mg  Furosemide BID  Atrial fibrillation [facility]) Obtained Cardiology  follow up with [facility] on Thursday, March 28th at 2:35pm.  Continue Amiodarone 200mg  dialy Cont Apixaban 2.5mg  BID Cont Metoprolol 12.5mg  BID   PATIENT EDUCATION PROVIDED: See AVS    Diagnosis and plan along with any newly prescribed medication(s) were discussed in detail with this patient today. The patient verbalized understanding and agreed with the plan. Patient advised if symptoms worsen return to  clinic or ER.   Health Maintainance:   No orders of the defined types were placed in this encounter.   No orders of the defined types were placed in this encounter.    Harolyn Rutherford, DO 03/21/2018, 11:18 PM PGY-1, [place]

## 2018-03-14 NOTE — Patient Instructions (Signed)
It was great to see you again today! Thank you for letting me participate in your care!  Today, we discussed getting your appointment with your Cardiologist set up. Our office will call them and schedule an appointment for you and then we will call you to let you know when and where your appointment will be. It is important to have a Cardiologist to manage your Atrial Fibrillation and the medications you take for Atrial Fibrillation.  Please keep you appointment with surgery on 3/28 at 2:30pm and keep [place] updated. They will manage the care of your colostomy bag.  I will continue to monitor your legs for improved swelling. They look better today, please continue to take Furosemide as prescribed and elevate your legs at night.  Be well, Harolyn Rutherford, DO PGY-1, [facility]

## 2018-03-16 DIAGNOSIS — I10 Essential (primary) hypertension: Secondary | ICD-10-CM | POA: Diagnosis not present

## 2018-03-16 DIAGNOSIS — I482 Chronic atrial fibrillation: Secondary | ICD-10-CM | POA: Diagnosis not present

## 2018-03-16 DIAGNOSIS — C189 Malignant neoplasm of colon, unspecified: Secondary | ICD-10-CM | POA: Diagnosis not present

## 2018-03-16 DIAGNOSIS — Z7901 Long term (current) use of anticoagulants: Secondary | ICD-10-CM | POA: Diagnosis not present

## 2018-03-16 DIAGNOSIS — Z433 Encounter for attention to colostomy: Secondary | ICD-10-CM | POA: Diagnosis not present

## 2018-03-18 ENCOUNTER — Ambulatory Visit: Payer: Medicare Other | Admitting: Cardiovascular Disease

## 2018-03-18 DIAGNOSIS — I482 Chronic atrial fibrillation: Secondary | ICD-10-CM | POA: Diagnosis not present

## 2018-03-18 DIAGNOSIS — Z7901 Long term (current) use of anticoagulants: Secondary | ICD-10-CM | POA: Diagnosis not present

## 2018-03-18 DIAGNOSIS — Z433 Encounter for attention to colostomy: Secondary | ICD-10-CM | POA: Diagnosis not present

## 2018-03-18 DIAGNOSIS — I10 Essential (primary) hypertension: Secondary | ICD-10-CM | POA: Diagnosis not present

## 2018-03-18 DIAGNOSIS — C189 Malignant neoplasm of colon, unspecified: Secondary | ICD-10-CM | POA: Diagnosis not present

## 2018-03-21 ENCOUNTER — Encounter: Payer: Self-pay | Admitting: Cardiovascular Disease

## 2018-03-21 DIAGNOSIS — C189 Malignant neoplasm of colon, unspecified: Secondary | ICD-10-CM | POA: Diagnosis not present

## 2018-03-21 DIAGNOSIS — Z7901 Long term (current) use of anticoagulants: Secondary | ICD-10-CM | POA: Diagnosis not present

## 2018-03-21 DIAGNOSIS — Z433 Encounter for attention to colostomy: Secondary | ICD-10-CM | POA: Diagnosis not present

## 2018-03-21 DIAGNOSIS — I10 Essential (primary) hypertension: Secondary | ICD-10-CM | POA: Diagnosis not present

## 2018-03-21 DIAGNOSIS — I482 Chronic atrial fibrillation: Secondary | ICD-10-CM | POA: Diagnosis not present

## 2018-03-21 NOTE — Assessment & Plan Note (Signed)
Obtained Cardiology follow up with [facility] on Thursday, March 28th at 2:35pm.  Continue Amiodarone 200mg  dialy Cont Apixaban 2.5mg  BID Cont Metoprolol 12.5mg  BID

## 2018-03-21 NOTE — Assessment & Plan Note (Signed)
Improved but still has LE edema. Patient is ambulating and will continue to elevate legs at night.   Cont 80mg  Furosemide BID

## 2018-03-23 DIAGNOSIS — Z433 Encounter for attention to colostomy: Secondary | ICD-10-CM | POA: Diagnosis not present

## 2018-03-23 DIAGNOSIS — I10 Essential (primary) hypertension: Secondary | ICD-10-CM | POA: Diagnosis not present

## 2018-03-23 DIAGNOSIS — C189 Malignant neoplasm of colon, unspecified: Secondary | ICD-10-CM | POA: Diagnosis not present

## 2018-03-23 DIAGNOSIS — I482 Chronic atrial fibrillation: Secondary | ICD-10-CM | POA: Diagnosis not present

## 2018-03-23 DIAGNOSIS — Z7901 Long term (current) use of anticoagulants: Secondary | ICD-10-CM | POA: Diagnosis not present

## 2018-03-28 DIAGNOSIS — I10 Essential (primary) hypertension: Secondary | ICD-10-CM | POA: Diagnosis not present

## 2018-03-28 DIAGNOSIS — Z433 Encounter for attention to colostomy: Secondary | ICD-10-CM | POA: Diagnosis not present

## 2018-03-28 DIAGNOSIS — C189 Malignant neoplasm of colon, unspecified: Secondary | ICD-10-CM | POA: Diagnosis not present

## 2018-03-28 DIAGNOSIS — I482 Chronic atrial fibrillation: Secondary | ICD-10-CM | POA: Diagnosis not present

## 2018-03-28 DIAGNOSIS — Z7901 Long term (current) use of anticoagulants: Secondary | ICD-10-CM | POA: Diagnosis not present

## 2018-03-30 DIAGNOSIS — Z433 Encounter for attention to colostomy: Secondary | ICD-10-CM | POA: Diagnosis not present

## 2018-03-30 DIAGNOSIS — Z7901 Long term (current) use of anticoagulants: Secondary | ICD-10-CM | POA: Diagnosis not present

## 2018-03-30 DIAGNOSIS — I482 Chronic atrial fibrillation: Secondary | ICD-10-CM | POA: Diagnosis not present

## 2018-03-30 DIAGNOSIS — I10 Essential (primary) hypertension: Secondary | ICD-10-CM | POA: Diagnosis not present

## 2018-03-30 DIAGNOSIS — C189 Malignant neoplasm of colon, unspecified: Secondary | ICD-10-CM | POA: Diagnosis not present

## 2018-04-06 DIAGNOSIS — Z433 Encounter for attention to colostomy: Secondary | ICD-10-CM | POA: Diagnosis not present

## 2018-04-06 DIAGNOSIS — I10 Essential (primary) hypertension: Secondary | ICD-10-CM | POA: Diagnosis not present

## 2018-04-06 DIAGNOSIS — Z7901 Long term (current) use of anticoagulants: Secondary | ICD-10-CM | POA: Diagnosis not present

## 2018-04-06 DIAGNOSIS — I482 Chronic atrial fibrillation: Secondary | ICD-10-CM | POA: Diagnosis not present

## 2018-04-06 DIAGNOSIS — C189 Malignant neoplasm of colon, unspecified: Secondary | ICD-10-CM | POA: Diagnosis not present

## 2018-04-08 DIAGNOSIS — I482 Chronic atrial fibrillation: Secondary | ICD-10-CM | POA: Diagnosis not present

## 2018-04-08 DIAGNOSIS — Z433 Encounter for attention to colostomy: Secondary | ICD-10-CM | POA: Diagnosis not present

## 2018-04-08 DIAGNOSIS — Z7901 Long term (current) use of anticoagulants: Secondary | ICD-10-CM | POA: Diagnosis not present

## 2018-04-08 DIAGNOSIS — C189 Malignant neoplasm of colon, unspecified: Secondary | ICD-10-CM | POA: Diagnosis not present

## 2018-04-08 DIAGNOSIS — I10 Essential (primary) hypertension: Secondary | ICD-10-CM | POA: Diagnosis not present

## 2018-04-11 ENCOUNTER — Telehealth: Payer: Self-pay

## 2018-04-11 ENCOUNTER — Ambulatory Visit (HOSPITAL_COMMUNITY): Payer: Medicare Other

## 2018-04-11 NOTE — Telephone Encounter (Signed)
Returned patient's call to let him know that he is scheduled for a lab draw and appointment with Dr. Burr Medico on 04/18/18 @ 10:45 AM. Patient voiced intent to be at appointment.

## 2018-04-12 ENCOUNTER — Telehealth: Payer: Self-pay

## 2018-04-12 DIAGNOSIS — Z7901 Long term (current) use of anticoagulants: Secondary | ICD-10-CM | POA: Diagnosis not present

## 2018-04-12 DIAGNOSIS — C189 Malignant neoplasm of colon, unspecified: Secondary | ICD-10-CM | POA: Diagnosis not present

## 2018-04-12 DIAGNOSIS — Z433 Encounter for attention to colostomy: Secondary | ICD-10-CM | POA: Diagnosis not present

## 2018-04-12 DIAGNOSIS — I482 Chronic atrial fibrillation, unspecified: Secondary | ICD-10-CM

## 2018-04-12 DIAGNOSIS — I10 Essential (primary) hypertension: Secondary | ICD-10-CM | POA: Diagnosis not present

## 2018-04-12 NOTE — Telephone Encounter (Signed)
Caryl Pina, RN with [facility], called to let PCP know patient's BP was elevated today at 162/70. Patient states compliant on meds except out of Amiodarone, needs refill.  Would also like to talk to PCP about wound care.  Call back is [phone].   Danley Danker, RN [facility] RN)

## 2018-04-14 DIAGNOSIS — C189 Malignant neoplasm of colon, unspecified: Secondary | ICD-10-CM | POA: Diagnosis not present

## 2018-04-14 DIAGNOSIS — I482 Chronic atrial fibrillation: Secondary | ICD-10-CM | POA: Diagnosis not present

## 2018-04-14 DIAGNOSIS — Z7901 Long term (current) use of anticoagulants: Secondary | ICD-10-CM | POA: Diagnosis not present

## 2018-04-14 DIAGNOSIS — I10 Essential (primary) hypertension: Secondary | ICD-10-CM | POA: Diagnosis not present

## 2018-04-14 DIAGNOSIS — Z433 Encounter for attention to colostomy: Secondary | ICD-10-CM | POA: Diagnosis not present

## 2018-04-16 NOTE — Progress Notes (Signed)
[place]  [phone] [phone]  Clinic Follow up Note   [name], DO as PCP - General (Family Medicine) 04/18/2018  CHIEF COMPLAINT: f/u colon cancer   SUMMARY OF ONCOLOGIC HISTORY:   Cancer of left colon ([place])   07/25/2017 Procedure    Upper Endoscopy by Dr. Silverio Decamp 07/25/17  IMPRESSION: - White nummular lesions in esophageal mucosa. Biopsied. - Non-bleeding esophageal ulcer. - Non-obstructing non-bleeding gastric ulcers with no stigmata of bleeding. NSAID induced etiology. There is no evidence of perforation. Biopsied. - Normal examined duodenum.       07/25/2017 Pathology Results    Diagnosis 07/25/17 1. Stomach, biopsy - CHRONIC ACTIVE GASTRITIS WITH HELICOBACTER PYLORI. - NO INTESTINAL METAPLASIA, DYSPLASIA, OR MALIGNANCY. 2. Esophagus, biopsy - REFLUX CHANGES. - PAS IS NEGATIVE FOR FUNGAL ORGANISMS. - NO INTESTINAL METAPLASIA, DYSPLASIA, OR MALIGNANCY. Microscopic Comment 1. A Warthin-Starry stain is performed to determine the possibility of the presence of Helicobacter pylori. Organisms of Helicobacter pylori are identified on the Warthin-Starry stain.        09/15/2017 Imaging    CT AP W Contrast 09/15/17  IMPRESSION: 1. Large inflammatory pericolonic ill-defined heterogeneous collection arising from the distal descending colon containing air, fluid and possible stool. Colonic wall thickening just proximal distal. Differential considerations include diverticulitis with abscess versus perforated colonic malignancy. No significant diverticular disease is seen elsewhere in the colon, which increases concern for malignancy. 2. Perinephric edema and diminished renal excretion on delayed phase imaging suggesting underlying renal disease. 3. Hepatic granuloma.  No focal hepatic mass. 4.  Aortic Atherosclerosis (ICD10-I70.0). These results were called by telephone at the time of interpretation on  09/15/2017 at 11:25 pm to Dr. Thomasene Lot , who verbally acknowledged these results.      11/08/2017 Imaging    CT AP W Contrast 11/08/17  IMPRESSION: 1. The position of the left abdominal percutaneous pigtail catheter appears stable since October. No residual abscess but continued and perhaps progressive circumferential colonic wall thickening and mesenteric inflammation near the catheter. 2. Increased low-density stool distending the upstream left colon and splenic flexure raising the possibility of partial obstruction.  3. Consider Colonoscopy: The persistent colonic wall thickening in #1 may be inflammatory but colonic tumor is difficult to exclude. 4. New since October small to moderate size loculated left pleural effusion. Associated lung base atelectasis without definite pneumonia.       11/14/2017 Imaging    CT AP WO Contrast 11/14/17  IMPRESSION: 1. New pneumoperitoneum compared to the prior examination, compatible with perforated viscus. The exact source of this is uncertain, but this is suspected be related to gas traversing the chronic diverticular abscess wall at the site of the indwelling catheter. This was discussed by phone with Dr. Romana Juniper. 2. Diffuse body wall edema, mesenteric edema and retroperitoneal edema with bilateral pleural effusions, suspicious for a state of anasarca. 3. Multiple nonobstructive calculi in the collecting systems of both kidneys measuring up to 8 mm in the lower pole collecting system of the right kidney. There is also some new high attenuation material lying dependently in the large right bladder diverticulum. This could represent multiple tiny calculi, but this is unlikely given the development compared to the recent prior examination. Rather, this is favored to represent some proteinaceous/hemorrhagic debris or residual contrast material from prior CT scan 11/08/2017. 4. Aortic atherosclerosis. 5. Additional findings, as  above. Aortic Atherosclerosis (ICD10-I70.0).      11/14/2017 Surgery    EXPLORATORY LAPAROTOMY WITH HARTMANN  PROCEDURE by Dr. Windle Guard and Dr. Harlow Asa 11/14/17       11/14/2017 Pathology Results    Diagnosis 11/14/17  Colon, segmental resection for tumor, Descending and Sigmoid - INVASIVE ADENOCARCINOMA, MODERATELY DIFFERENTIATED, SPANNING 4.3 CM. - ADENOCARCINOMA IS INVOLVED WITH TRANSMURAL DEFECT AND IS AT INKED SEROSAL EDGE. - METASTATIC CARCINOMA IN 2 OF 5 LYMPH NODES (2/5) WITH EXTRACAPSULAR EXTENSION. - MULTIPLE SOFT TISSUE DEPOSITS. - THE PROXIMAL AND DISTAL RESECTION MARGINS ARE NEGATIVE FOR ADENOCARCINOMA. - SEE ONCOLOGY TABLE BELOW.      12/06/2017 Initial Diagnosis    Cancer of left colon [facility])      HISTORY OF PRESENTING ILLNESS by Lucianne Lei 02/10/18:  Roberto Owen 83 y.o. male who presents to clinic today in follow-up of a consultation with Dr. Burr Medico from 11/19/2017.  Roberto Owen' recent history began in August 2018 when he traveled to [place] from [place] to visit family and to see his grandson play football.  He presented to the emergency room at [facility] on 07/23/2017 after having a syncopal episode.  His labs returned showing a hemoglobin of 6.2, hematocrit of 23.1, MCV of 60 and ferritin of 4.  Stools were guaiac positive.  He acknowledged that he had been taking BC and Goody's powders for an extended period.  He was transfused with packed red blood cells.  Additionally he was taken for an upper endoscopy on 07/25/2017  [phone]) which showed chronic active gastritis with H. pylori.  No metaplasia dysplasia or malignancy was noted.  He was given 510 mg of parenteral iron while hospitalized and was restarted on oral iron.  He reported a GI bleed in around 2012 while in [place].  He was admitted to the hospital and transfused with packed red blood cells at that time and started on oral iron.  Roberto Owen was admitted to [facility] again on 09/16/2017 after  presenting to the emergency room with a 4-5-day history of abdominal pain, nausea, and loss of appetite.  His abdominal pain was localized in the left lower quadrant.  He had not had a bowel movement for 3-4 days.  A CT scan of his abdomen and pelvis was completed and showed a large inflammatory pericolonic ill-defined heterogeneous collection arising from the distal descending colon and containing air, fluid, and possible stool.  Colonic wall thickening was noted just proximal and distal to this lesion.  Roberto Owen was admitted for IV antibiotics and for a consultation with interventional radiology for placement of a percutaneous drain.  A percutaneous drain was placed under CT guidance on 09/16/2017 with 50 cc of material drained.  Cultures were collected and returned inconclusive.  A repeat CT scan completed on 09/20/2017 showed significant improvement.  The patient was discharged home on oral antibiotics with a percutaneous drain in place.  Roberto Owen was next seen in the emergency room on 10/23/2017 when he presented with 1 week of dark discharge from his percutaneous drain and around the drain insertion site.  Recommendations were for placement of a larger percutaneous drain.  Roberto Owen was evaluated and discharged home.  A new 14 French drain was placed on 10/28/2017.  Roberto Owen again presented to the emergency room on 11/08/2017 when his percutaneous drain stopped producing output and he noted a 4-day history of loss of appetite, GI spasms with pickups, nausea, and vomiting.  On this admission he was found to have new onset atrial fibrillation with RVR.  Cardiology was consulted.  He was begun on a diltiazem drip with poor  control of his A. fib.  He was next begun on an amiodarone drip.  He was well controlled on amiodarone.  Diltiazem was discontinued and he was converted to oral amiodarone.  He was found to have a left loculated effusion from a presumed pneumonia.  Interventional radiology was  consulted with a drain placed.  Patient became bradycardic with altered mental status and was found to have a pneumoperitoneum on the KUB.  He was taken for emergent surgery at which time a Hartman's procedure was performed on 11/14/2017.  He was given a colostomy and had a colon resection.  His pathology ([id number]) returned showing a pT4a, pN1b colorectal carcinoma with 2 of 5 lymph nodes positive for metastatic disease.  He was treated with vancomycin for 10 days and Zosyn for 14 days postoperatively.  During his period of recovery he was found to have acute kidney injury due to poor oral hydration.  Mr. Stammer was ultimately discharged to rehab on 11/26/2017.  During his last hospitalization he was seen by Dr. Burr Medico in consultation on 11/19/2017.  CURRENT THERAPY: Observation   INTERVAL HISTORY: Roberto Owen is here for follow up. He presents to the clinic today by himself. He reports he is continuing to recover from surgery with his energy level increasing. He is also in good spirits. He also reports lower extremity swelling. He states he is planning on traveling to [place] on May 30 and returning some time in July.   On review of systems, pt denies abdominal pain, or any other complaints at this time. Pertinent positives are listed and detailed within the above HPI.   REVIEW OF SYSTEMS:   Constitutional: Denies fevers, chills or abnormal weight loss (+) good energy  Eyes: Denies blurriness of vision Ears, nose, mouth, throat, and face: Denies mucositis or sore throat Respiratory: Denies cough, dyspnea or wheezes Cardiovascular: Denies palpitation, chest discomfort (+) lower extremity swelling Gastrointestinal:  Denies nausea, heartburn or change in bowel habits Skin: Denies abnormal skin rashes Lymphatics: Denies new lymphadenopathy or easy bruising Neurological:Denies numbness, tingling or new weaknesses Behavioral/Psych: Mood is stable, no new changes  All other systems were reviewed  with the patient and are negative.  MEDICAL HISTORY:  Past Medical History:  Diagnosis Date  . Anemia   . Bladder diverticulum 11/10/2017  . Esophageal ulcer    hx/notes 09/15/2017  . Gastric ulcer    hx/notes 09/15/2017  . GI bleed   . History of blood transfusion 07/2017  . NSAID induced gastritis     SURGICAL HISTORY: Past Surgical History:  Procedure Laterality Date  . COLONOSCOPY  ~ 05/2016  . ESOPHAGOGASTRODUODENOSCOPY (EGD) WITH PROPOFOL N/A 07/25/2017   Procedure: ESOPHAGOGASTRODUODENOSCOPY (EGD) WITH PROPOFOL;  Surgeon: Mauri Pole, MD;  Location: [facility];  Service: Endoscopy;  Laterality: N/A;  . IR CATHETER TUBE CHANGE  10/28/2017  . IR RADIOLOGIST EVAL & MGMT  09/30/2017  . IR RADIOLOGIST EVAL & MGMT  10/19/2017  . LAPAROTOMY N/A 11/14/2017   Procedure: EXPLORATORY LAPAROTOMY WITH HARTMANN PROCEDURE;  Surgeon: Clovis Riley, MD;  Location: [facility];  Service: General;  Laterality: N/A;    I have reviewed the social history and family history with the patient and they are unchanged from previous note.  ALLERGIES:  has No Known Allergies.  MEDICATIONS:  Current Outpatient Medications  Medication Sig Dispense Refill  . acetaminophen (TYLENOL) 500 MG tablet Take 1,000 mg by mouth every 6 (six) hours as needed for mild pain.    Marland Kitchen amiodarone (PACERONE)  200 MG tablet Take 1 tablet (200 mg total) by mouth daily. 30 tablet 0  . apixaban (ELIQUIS) 2.5 MG TABS tablet Take 1 tablet (2.5 mg total) by mouth 2 (two) times daily. 60 tablet 0  . ferrous sulfate 325 (65 FE) MG tablet Take 1 tablet (325 mg total) by mouth daily with breakfast. 30 tablet 5  . furosemide (LASIX) 80 MG tablet Take 1 tablet (80 mg total) by mouth 2 (two) times daily. 60 tablet 2  . metoprolol tartrate (LOPRESSOR) 25 MG tablet Take 0.5 tablets (12.5 mg total) by mouth 2 (two) times daily. 30 tablet   . pantoprazole (PROTONIX) 40 MG tablet Take 1 tablet (40 mg total) by mouth daily. 180 tablet 2    . Simethicone 180 MG CAPS Take 1 capsule every 4 (four) hours as needed by mouth (for gas, bloating).     No current facility-administered medications for this visit.     PHYSICAL EXAMINATION: ECOG PERFORMANCE STATUS: 1-2  Vitals:   04/18/18 1107  BP: (!) 153/72  Pulse: 60  Resp: 19  Temp: 97.7 F (36.5 C)  SpO2: 99%   Filed Weights   04/18/18 1107  Weight: 224 lb 4.8 oz (101.7 kg)    GENERAL:alert, no distress and comfortable SKIN: skin color, texture, turgor are normal, no rashes or significant lesions EYES: normal, Conjunctiva are pink and non-injected, sclera clear OROPHARYNX:no exudate, no erythema and lips, buccal mucosa, and tongue normal  NECK: supple, thyroid normal size, non-tender, without nodularity LYMPH:  no palpable lymphadenopathy in the cervical, axillary or inguinal LUNGS: clear to auscultation and percussion with normal breathing effort HEART: regular rate & rhythm and no murmurs and no lower extremity edema ABDOMEN:abdomen soft, non-tender and normal bowel sounds Musculoskeletal:no cyanosis of digits and no clubbing  NEURO: alert & oriented x 3 with fluent speech, no focal motor/sensory deficits  LABORATORY DATA:  I have reviewed the data as listed CBC Latest Ref Rng & Units 04/18/2018 03/10/2018 02/15/2018  WBC 4.0 - 10.3 K/uL 6.6 6.0 6.2  Hemoglobin 13.0 - 17.1 g/dL 10.4(L) 8.7(L) 9.1(L)  Hematocrit 38.4 - 49.9 % 32.7(L) 28.1(L) 28.7(L)  Platelets 140 - 400 K/uL 297 336 427(H)     CMP Latest Ref Rng & Units 04/18/2018 03/10/2018 02/15/2018  Glucose 70 - 140 mg/dL 91 114(H) 85  BUN 7 - 26 mg/dL 35(H) 31(H) 30(H)  Creatinine 0.70 - 1.30 mg/dL 1.61(H) 1.72(H) 1.53(H)  Sodium 136 - 145 mmol/L 141 137 140  Potassium 3.5 - 5.1 mmol/L 3.7 3.6 3.3(L)  Chloride 98 - 109 mmol/L 104 102 105  CO2 22 - 29 mmol/L _0 Calcium 8.4 - 10.4 mg/dL 9.4 8.3(L) 8.7(L)  Total Protein 6.4 - 8.3 g/dL 9.2(H) 7.8 -  Total Bilirubin 0.2 - 1.2 mg/dL 0.3 0.4 -   Alkaline Phos 40 - 150 U/L 95 74 -  AST 5 - 34 U/L 30 18 -  ALT 0 - 55 U/L 28 8(L) -    RADIOGRAPHIC STUDIES: I have personally reviewed the radiological images as listed and agreed with the findings in the report.  CT AP W Contrast 03/10/18 IMPRESSION: 1. Mild urinary bladder wall thickening which may be chronic, or represent cystitis. Chronic posterior right bladder diverticulum containing hyperdense material, which may stone or complex material. 2. Two questionable low-density lesions in the right lobe of the liver, 16 and19 mm, not seen on prior exam. Borders are ill-defined, and this may simply represent focal fatty infiltration, however underlying hepatic  lesion is also considered. Recommend further evaluation with MRI. Consider elective MRI after resolution of acute event, patient must be able to tolerate breath hold technique. 3. Well-defined small ovoid hyperdensity in the dependent pelvis, not seen on prior exams. This is likely sequela of prior surgery and chronic hematoma. No surrounding inflammation. 4. Absent renal excretion on delayed phase imaging consistent with diminished renal function. 5. Left lower quadrant colostomy without parastomal hernia. 6.  Aortic Atherosclerosis (ICD10-I70.0).  No results found.   ASSESSMENT & PLAN: 83 y.o.  African-American male, with   1. Adenocarcinoma of sigmoid colon, with perforation, pT4aN1cMx, s/p ex lap and colostomy(Hartmann's Procedure): -I met Roberto Owen in the hospital when he was initially diagnosed with colon cancer in November 2018. Due to the abscess and other complication related to the perforated colon cancer, he had prolonged hospital stay and rehab.    -We previously discussed his staging, the nature history of colon cancer, and the high risk of recurrence after surgery.  Due to the Stage IIIB ([id number]) disease and perforation before surgery, he is at very high risk for metastasis, especially peritoneal  metastasis.  I encouraged him to consider adjuvant chemotherapy.   -Due to his advanced age, and prolonged recovery, he is not a candidate for intensive FOLFOX or CAPOX, but he probably would be able to tolerate single agent Xeloda.  The problem is his surgery was close to 3 months ago, and we know no significant benefit of adjuvant chemo if it is given 3 months or more after surgery. We previously discussed the above with patient and his daughter in great details.  Pt decided not to take Xeloda.  -He presented to the hospital on 03/10/18 with LLQ abdominal pain. He had a CT AP W Contrast that revealed 2 areas of concern in the liver.  This is concerning for liver metastasis.  I recommend a PET Scan for further evaluation. I discussed that if the area is positive he will need a biopsy to confirm metastatic disease.  He is agreeable with the plan. -He is clinically doing well, has recovered well from surgery, I encouraged him to continue eating well, and he remain to be physically active.  He has a very positive attitude. -F/u after PET Scan   2. AF, leg edema, CKD stage III F/u with PCP and continue meds   PLAN:  -PET Scan in1- 2 weeks  -F/u after scan   No problem-specific Assessment & Plan notes found for this encounter.   Orders Placed This Encounter  Procedures  . NM PET Image Initial (PI) Skull Base To Thigh    Liver lesions on recent CT scan, rule out metastasis    Standing Status:   Future    Standing Expiration Date:   04/18/2019    Order Specific Question:   If indicated for the ordered procedure, I authorize the administration of a radiopharmaceutical per Radiology protocol    Answer:   Yes    Order Specific Question:   Preferred imaging location?    Answer:   [facility]    Order Specific Question:   Radiology Contrast Protocol - do NOT remove file path    Answer:   [email or web address]   All questions were answered. The patient knows to call the  clinic with any problems, questions or concerns. No barriers to learning was detected. I spent 20 minutes counseling the patient face to face. The total time spent in the appointment was 25 minutes and more than 50%  was on counseling and review of test results   This document serves as a record of services personally performed by Truitt Merle, MD. It was created on her behalf by Theresia Bough, a trained medical scribe. The creation of this record is based on the scribe's personal observations and the provider's statements to them.   I have reviewed the above documentation for accuracy and completeness, and I agree with the above.    Truitt Merle, MD 04/18/18

## 2018-04-18 ENCOUNTER — Inpatient Hospital Stay: Payer: Medicare Other

## 2018-04-18 ENCOUNTER — Inpatient Hospital Stay: Payer: Medicare Other | Attending: Hematology | Admitting: Hematology

## 2018-04-18 ENCOUNTER — Encounter: Payer: Self-pay | Admitting: Hematology

## 2018-04-18 ENCOUNTER — Telehealth: Payer: Self-pay

## 2018-04-18 VITALS — BP 153/72 | HR 60 | Temp 97.7°F | Resp 19 | Ht 78.0 in | Wt 224.3 lb

## 2018-04-18 DIAGNOSIS — C186 Malignant neoplasm of descending colon: Secondary | ICD-10-CM

## 2018-04-18 DIAGNOSIS — L89610 Pressure ulcer of right heel, unstageable: Secondary | ICD-10-CM | POA: Diagnosis not present

## 2018-04-18 DIAGNOSIS — Z79899 Other long term (current) drug therapy: Secondary | ICD-10-CM | POA: Diagnosis not present

## 2018-04-18 DIAGNOSIS — Z8719 Personal history of other diseases of the digestive system: Secondary | ICD-10-CM | POA: Insufficient documentation

## 2018-04-18 DIAGNOSIS — I700 Atherosclerosis of aorta: Secondary | ICD-10-CM | POA: Insufficient documentation

## 2018-04-18 DIAGNOSIS — M7989 Other specified soft tissue disorders: Secondary | ICD-10-CM | POA: Insufficient documentation

## 2018-04-18 DIAGNOSIS — J90 Pleural effusion, not elsewhere classified: Secondary | ICD-10-CM | POA: Diagnosis not present

## 2018-04-18 DIAGNOSIS — Z933 Colostomy status: Secondary | ICD-10-CM | POA: Insufficient documentation

## 2018-04-18 DIAGNOSIS — N323 Diverticulum of bladder: Secondary | ICD-10-CM | POA: Diagnosis not present

## 2018-04-18 DIAGNOSIS — L89620 Pressure ulcer of left heel, unstageable: Secondary | ICD-10-CM | POA: Diagnosis not present

## 2018-04-18 DIAGNOSIS — I482 Chronic atrial fibrillation: Secondary | ICD-10-CM | POA: Diagnosis not present

## 2018-04-18 DIAGNOSIS — N183 Chronic kidney disease, stage 3 (moderate): Secondary | ICD-10-CM | POA: Diagnosis not present

## 2018-04-18 DIAGNOSIS — C189 Malignant neoplasm of colon, unspecified: Secondary | ICD-10-CM | POA: Diagnosis not present

## 2018-04-18 DIAGNOSIS — Z433 Encounter for attention to colostomy: Secondary | ICD-10-CM | POA: Diagnosis not present

## 2018-04-18 DIAGNOSIS — R600 Localized edema: Secondary | ICD-10-CM | POA: Diagnosis not present

## 2018-04-18 DIAGNOSIS — I10 Essential (primary) hypertension: Secondary | ICD-10-CM | POA: Diagnosis not present

## 2018-04-18 LAB — CBC WITH DIFFERENTIAL (CANCER CENTER ONLY)
Basophils Absolute: 0 10*3/uL (ref 0.0–0.1)
Basophils Relative: 1 %
Eosinophils Absolute: 0.3 10*3/uL (ref 0.0–0.5)
Eosinophils Relative: 4 %
HEMATOCRIT: 32.7 % — AB (ref 38.4–49.9)
Hemoglobin: 10.4 g/dL — ABNORMAL LOW (ref 13.0–17.1)
LYMPHS ABS: 1 10*3/uL (ref 0.9–3.3)
LYMPHS PCT: 15 %
MCH: 26.5 pg — AB (ref 27.2–33.4)
MCHC: 31.8 g/dL — ABNORMAL LOW (ref 32.0–36.0)
MCV: 83.4 fL (ref 79.3–98.0)
MONO ABS: 0.5 10*3/uL (ref 0.1–0.9)
Monocytes Relative: 7 %
NEUTROS ABS: 4.9 10*3/uL (ref 1.5–6.5)
Neutrophils Relative %: 73 %
Platelet Count: 297 10*3/uL (ref 140–400)
RBC: 3.92 MIL/uL — AB (ref 4.20–5.82)
RDW: 14.2 % (ref 11.0–14.6)
WBC Count: 6.6 10*3/uL (ref 4.0–10.3)

## 2018-04-18 LAB — CMP (CANCER CENTER ONLY)
ALBUMIN: 3.4 g/dL — AB (ref 3.5–5.0)
ALT: 28 U/L (ref 0–55)
ANION GAP: 9 (ref 3–11)
AST: 30 U/L (ref 5–34)
Alkaline Phosphatase: 95 U/L (ref 40–150)
BUN: 35 mg/dL — AB (ref 7–26)
CHLORIDE: 104 mmol/L (ref 98–109)
CO2: 28 mmol/L (ref 22–29)
Calcium: 9.4 mg/dL (ref 8.4–10.4)
Creatinine: 1.61 mg/dL — ABNORMAL HIGH (ref 0.70–1.30)
GFR, Est AFR Am: 45 mL/min — ABNORMAL LOW (ref >60)
GFR, Estimated: 39 mL/min — ABNORMAL LOW (ref >60)
GLUCOSE: 91 mg/dL (ref 70–140)
POTASSIUM: 3.7 mmol/L (ref 3.5–5.1)
SODIUM: 141 mmol/L (ref 136–145)
Total Bilirubin: 0.3 mg/dL (ref 0.2–1.2)
Total Protein: 9.2 g/dL — ABNORMAL HIGH (ref 6.4–8.3)

## 2018-04-18 LAB — IRON AND TIBC
Iron: 39 ug/dL — ABNORMAL LOW (ref 42–163)
SATURATION RATIOS: 16 % — AB (ref 42–163)
TIBC: 240 ug/dL (ref 202–409)
UIBC: 201 ug/dL

## 2018-04-18 LAB — FERRITIN: Ferritin: 56 ng/mL (ref 22–316)

## 2018-04-18 LAB — CEA (IN HOUSE-CHCC): CEA (CHCC-In House): 11.98 ng/mL — ABNORMAL HIGH (ref 0.00–5.00)

## 2018-04-18 NOTE — Telephone Encounter (Signed)
Printed avs and calender of upcoming appointment. Radiology will call patient to schedule PET scan. Per 4/29 los

## 2018-04-19 DIAGNOSIS — Z433 Encounter for attention to colostomy: Secondary | ICD-10-CM | POA: Diagnosis not present

## 2018-04-19 DIAGNOSIS — L89610 Pressure ulcer of right heel, unstageable: Secondary | ICD-10-CM | POA: Diagnosis not present

## 2018-04-19 DIAGNOSIS — I10 Essential (primary) hypertension: Secondary | ICD-10-CM | POA: Diagnosis not present

## 2018-04-19 DIAGNOSIS — C189 Malignant neoplasm of colon, unspecified: Secondary | ICD-10-CM | POA: Diagnosis not present

## 2018-04-19 DIAGNOSIS — I482 Chronic atrial fibrillation: Secondary | ICD-10-CM | POA: Diagnosis not present

## 2018-04-19 DIAGNOSIS — L89620 Pressure ulcer of left heel, unstageable: Secondary | ICD-10-CM | POA: Diagnosis not present

## 2018-04-19 MED ORDER — AMIODARONE HCL 200 MG PO TABS: 200.0000 mg | ORAL_TABLET | Freq: Every day | ORAL | 0 refills | Status: DC

## 2018-04-19 NOTE — Telephone Encounter (Signed)
As preceptor, I dealt with the following issues. 1. I gave the verbal orders for wound care as requested by [facility]. 2. I refilled his amiodorone for his atrial fibri, which he has been out of for a while. 3. Also Caryl Pina told me the patient was out of and could not afford Eliquis.  I did not send in a new Rx because she was sure he would not be able to pick up.  Did advise him to start taking an ASA 81 mg per day until this is sorted out.    Will forward to PCP.  Choices are switch to coumadin due to lower out of pocket costs or see if he qualifies for prescription via MAP.

## 2018-04-19 NOTE — Telephone Encounter (Signed)
Second page to PCP. Danley Danker, RN [facility] RN)

## 2018-04-19 NOTE — Telephone Encounter (Signed)
Caryl Pina with [facility] called again checking on the status of the pt's med refills. She also needs to talk to someone about wound care orders. Caryl Pina would like to be called back at [phone]

## 2018-04-19 NOTE — Telephone Encounter (Signed)
Home health nurse, Caryl Pina, has called back about this a second time. Text page in to PCP. Danley Danker, RN [facility] RN)

## 2018-04-19 NOTE — Telephone Encounter (Signed)
No response from PCP. Routed to preceptor.  Danley Danker, RN [facility] RN)

## 2018-04-21 ENCOUNTER — Other Ambulatory Visit: Payer: Self-pay

## 2018-04-21 DIAGNOSIS — L89620 Pressure ulcer of left heel, unstageable: Secondary | ICD-10-CM | POA: Diagnosis not present

## 2018-04-21 DIAGNOSIS — I10 Essential (primary) hypertension: Secondary | ICD-10-CM | POA: Diagnosis not present

## 2018-04-21 DIAGNOSIS — I482 Chronic atrial fibrillation: Secondary | ICD-10-CM | POA: Diagnosis not present

## 2018-04-21 DIAGNOSIS — C189 Malignant neoplasm of colon, unspecified: Secondary | ICD-10-CM | POA: Diagnosis not present

## 2018-04-21 DIAGNOSIS — Z433 Encounter for attention to colostomy: Secondary | ICD-10-CM | POA: Diagnosis not present

## 2018-04-21 DIAGNOSIS — L89610 Pressure ulcer of right heel, unstageable: Secondary | ICD-10-CM | POA: Diagnosis not present

## 2018-04-21 MED ORDER — METOPROLOL TARTRATE 25 MG PO TABS
12.5000 mg | ORAL_TABLET | Freq: Two times a day (BID) | ORAL | 1 refills | Status: DC
Start: 2018-04-21 — End: 2023-06-18

## 2018-04-21 NOTE — Telephone Encounter (Signed)
Roberto Owen, home health nurse, called for refill of Metoprolol. Call back is [phone] for any questions. Danley Danker, RN [facility] RN)

## 2018-04-25 ENCOUNTER — Telehealth: Payer: Self-pay

## 2018-04-25 DIAGNOSIS — L89620 Pressure ulcer of left heel, unstageable: Secondary | ICD-10-CM | POA: Diagnosis not present

## 2018-04-25 DIAGNOSIS — C189 Malignant neoplasm of colon, unspecified: Secondary | ICD-10-CM | POA: Diagnosis not present

## 2018-04-25 DIAGNOSIS — I10 Essential (primary) hypertension: Secondary | ICD-10-CM | POA: Diagnosis not present

## 2018-04-25 DIAGNOSIS — Z433 Encounter for attention to colostomy: Secondary | ICD-10-CM | POA: Diagnosis not present

## 2018-04-25 DIAGNOSIS — L89610 Pressure ulcer of right heel, unstageable: Secondary | ICD-10-CM | POA: Diagnosis not present

## 2018-04-25 DIAGNOSIS — I482 Chronic atrial fibrillation: Secondary | ICD-10-CM | POA: Diagnosis not present

## 2018-04-25 NOTE — Telephone Encounter (Signed)
Caryl Pina RN with [facility] Calling to report patients HR today was 48 on pulse ox, and 50 apical. Pt is asymptomatic, RN calling to inform MD. Her call back [phone] Wallace Cullens, RN

## 2018-04-27 ENCOUNTER — Telehealth: Payer: Self-pay

## 2018-04-27 NOTE — Telephone Encounter (Signed)
Called patient and he says that he is doing great. He states that he is out working in the yard as he was speaking to me. He states that all of his problems with his colostomy bag have been resolved and that he is planning to go back home to [place] at the end of May.  He was only here in [place] because he got sick during a visit. He says that he feels wonderful and has speaking engagements on Thursdays. He walks 2 miles a day and has no problems.  He says that he will have his home health nurse to call and give a full report of his current condition.  He feels that he does not need an appointment with cardiology unless they call him to set one up.  Ozella Almond, [place]

## 2018-04-29 DIAGNOSIS — Z433 Encounter for attention to colostomy: Secondary | ICD-10-CM | POA: Diagnosis not present

## 2018-04-29 DIAGNOSIS — I482 Chronic atrial fibrillation: Secondary | ICD-10-CM | POA: Diagnosis not present

## 2018-04-29 DIAGNOSIS — L89610 Pressure ulcer of right heel, unstageable: Secondary | ICD-10-CM | POA: Diagnosis not present

## 2018-04-29 DIAGNOSIS — C189 Malignant neoplasm of colon, unspecified: Secondary | ICD-10-CM | POA: Diagnosis not present

## 2018-04-29 DIAGNOSIS — L89620 Pressure ulcer of left heel, unstageable: Secondary | ICD-10-CM | POA: Diagnosis not present

## 2018-04-29 DIAGNOSIS — I10 Essential (primary) hypertension: Secondary | ICD-10-CM | POA: Diagnosis not present

## 2018-05-02 ENCOUNTER — Ambulatory Visit (HOSPITAL_COMMUNITY)
Admission: RE | Admit: 2018-05-02 | Discharge: 2018-05-02 | Disposition: A | Discharge status: Home / Self Care | Payer: Medicare Other | Source: Ambulatory Visit | Attending: Hematology | Admitting: Hematology

## 2018-05-02 DIAGNOSIS — Z433 Encounter for attention to colostomy: Secondary | ICD-10-CM | POA: Diagnosis not present

## 2018-05-02 DIAGNOSIS — R590 Localized enlarged lymph nodes: Secondary | ICD-10-CM | POA: Diagnosis not present

## 2018-05-02 DIAGNOSIS — L89610 Pressure ulcer of right heel, unstageable: Secondary | ICD-10-CM | POA: Diagnosis not present

## 2018-05-02 DIAGNOSIS — Z7901 Long term (current) use of anticoagulants: Secondary | ICD-10-CM | POA: Diagnosis not present

## 2018-05-02 DIAGNOSIS — C787 Secondary malignant neoplasm of liver and intrahepatic bile duct: Secondary | ICD-10-CM | POA: Diagnosis not present

## 2018-05-02 DIAGNOSIS — C186 Malignant neoplasm of descending colon: Secondary | ICD-10-CM | POA: Insufficient documentation

## 2018-05-02 DIAGNOSIS — R222 Localized swelling, mass and lump, trunk: Secondary | ICD-10-CM | POA: Insufficient documentation

## 2018-05-02 DIAGNOSIS — C189 Malignant neoplasm of colon, unspecified: Secondary | ICD-10-CM | POA: Diagnosis not present

## 2018-05-02 DIAGNOSIS — I10 Essential (primary) hypertension: Secondary | ICD-10-CM | POA: Diagnosis not present

## 2018-05-02 DIAGNOSIS — I482 Chronic atrial fibrillation: Secondary | ICD-10-CM | POA: Diagnosis not present

## 2018-05-02 DIAGNOSIS — L89620 Pressure ulcer of left heel, unstageable: Secondary | ICD-10-CM | POA: Diagnosis not present

## 2018-05-02 DIAGNOSIS — Z79899 Other long term (current) drug therapy: Secondary | ICD-10-CM | POA: Insufficient documentation

## 2018-05-02 LAB — GLUCOSE, CAPILLARY: GLUCOSE-CAPILLARY: 104 mg/dL — AB (ref 65–99)

## 2018-05-02 MED ORDER — FLUDEOXYGLUCOSE F - 18 (FDG) INJECTION
11.1500 | Freq: Once | INTRAVENOUS | Status: AC | PRN
  Administered 2018-05-02: 11.15 via INTRAVENOUS

## 2018-05-03 ENCOUNTER — Inpatient Hospital Stay: Payer: Medicare Other | Attending: Hematology | Admitting: Hematology

## 2018-05-03 DIAGNOSIS — N323 Diverticulum of bladder: Secondary | ICD-10-CM | POA: Insufficient documentation

## 2018-05-03 DIAGNOSIS — I4891 Unspecified atrial fibrillation: Secondary | ICD-10-CM | POA: Insufficient documentation

## 2018-05-03 DIAGNOSIS — I129 Hypertensive chronic kidney disease with stage 1 through stage 4 chronic kidney disease, or unspecified chronic kidney disease: Secondary | ICD-10-CM | POA: Insufficient documentation

## 2018-05-03 DIAGNOSIS — C787 Secondary malignant neoplasm of liver and intrahepatic bile duct: Secondary | ICD-10-CM | POA: Insufficient documentation

## 2018-05-03 DIAGNOSIS — Z933 Colostomy status: Secondary | ICD-10-CM | POA: Insufficient documentation

## 2018-05-03 DIAGNOSIS — C186 Malignant neoplasm of descending colon: Secondary | ICD-10-CM | POA: Insufficient documentation

## 2018-05-03 DIAGNOSIS — Z79899 Other long term (current) drug therapy: Secondary | ICD-10-CM | POA: Insufficient documentation

## 2018-05-03 DIAGNOSIS — N183 Chronic kidney disease, stage 3 (moderate): Secondary | ICD-10-CM | POA: Insufficient documentation

## 2018-05-03 DIAGNOSIS — I700 Atherosclerosis of aorta: Secondary | ICD-10-CM | POA: Insufficient documentation

## 2018-05-03 DIAGNOSIS — Z8711 Personal history of peptic ulcer disease: Secondary | ICD-10-CM | POA: Insufficient documentation

## 2018-05-03 DIAGNOSIS — D649 Anemia, unspecified: Secondary | ICD-10-CM | POA: Insufficient documentation

## 2018-05-03 DIAGNOSIS — C779 Secondary and unspecified malignant neoplasm of lymph node, unspecified: Secondary | ICD-10-CM | POA: Insufficient documentation

## 2018-05-03 DIAGNOSIS — J90 Pleural effusion, not elsewhere classified: Secondary | ICD-10-CM | POA: Insufficient documentation

## 2018-05-05 ENCOUNTER — Telehealth: Payer: Self-pay | Admitting: Hematology

## 2018-05-05 DIAGNOSIS — Z433 Encounter for attention to colostomy: Secondary | ICD-10-CM | POA: Diagnosis not present

## 2018-05-05 DIAGNOSIS — I482 Chronic atrial fibrillation: Secondary | ICD-10-CM | POA: Diagnosis not present

## 2018-05-05 DIAGNOSIS — L89620 Pressure ulcer of left heel, unstageable: Secondary | ICD-10-CM | POA: Diagnosis not present

## 2018-05-05 DIAGNOSIS — I10 Essential (primary) hypertension: Secondary | ICD-10-CM | POA: Diagnosis not present

## 2018-05-05 DIAGNOSIS — C189 Malignant neoplasm of colon, unspecified: Secondary | ICD-10-CM | POA: Diagnosis not present

## 2018-05-05 DIAGNOSIS — L89610 Pressure ulcer of right heel, unstageable: Secondary | ICD-10-CM | POA: Diagnosis not present

## 2018-05-05 NOTE — Telephone Encounter (Signed)
Appointments scheduled letter/calendar mailed to patient per 5/15 sch msg °

## 2018-05-09 DIAGNOSIS — Z433 Encounter for attention to colostomy: Secondary | ICD-10-CM | POA: Diagnosis not present

## 2018-05-09 DIAGNOSIS — L89620 Pressure ulcer of left heel, unstageable: Secondary | ICD-10-CM | POA: Diagnosis not present

## 2018-05-09 DIAGNOSIS — I482 Chronic atrial fibrillation: Secondary | ICD-10-CM | POA: Diagnosis not present

## 2018-05-09 DIAGNOSIS — C189 Malignant neoplasm of colon, unspecified: Secondary | ICD-10-CM | POA: Diagnosis not present

## 2018-05-09 DIAGNOSIS — L89610 Pressure ulcer of right heel, unstageable: Secondary | ICD-10-CM | POA: Diagnosis not present

## 2018-05-09 DIAGNOSIS — I10 Essential (primary) hypertension: Secondary | ICD-10-CM | POA: Diagnosis not present

## 2018-05-10 NOTE — Progress Notes (Addendum)
[place]  Telephone:([phone]) [phone] [phone]) [phone]  Clinic Follow up Note   [name], DO as PCP - General (Family Medicine)   Date of Service:  05/11/2018  CHIEF COMPLAINT: f/u colon cancer   SUMMARY OF ONCOLOGIC HISTORY:   Cancer of left colon ([place])   07/25/2017 Procedure    Upper Endoscopy by Dr. Silverio Decamp 07/25/17  IMPRESSION: - White nummular lesions in esophageal mucosa. Biopsied. - Non-bleeding esophageal ulcer. - Non-obstructing non-bleeding gastric ulcers with no stigmata of bleeding. NSAID induced etiology. There is no evidence of perforation. Biopsied. - Normal examined duodenum.       07/25/2017 Pathology Results    Diagnosis 07/25/17 1. Stomach, biopsy - CHRONIC ACTIVE GASTRITIS WITH HELICOBACTER PYLORI. - NO INTESTINAL METAPLASIA, DYSPLASIA, OR MALIGNANCY. 2. Esophagus, biopsy - REFLUX CHANGES. - PAS IS NEGATIVE FOR FUNGAL ORGANISMS. - NO INTESTINAL METAPLASIA, DYSPLASIA, OR MALIGNANCY. Microscopic Comment 1. A Warthin-Starry stain is performed to determine the possibility of the presence of Helicobacter pylori. Organisms of Helicobacter pylori are identified on the Warthin-Starry stain.        09/15/2017 Imaging    CT AP W Contrast 09/15/17  IMPRESSION: 1. Large inflammatory pericolonic ill-defined heterogeneous collection arising from the distal descending colon containing air, fluid and possible stool. Colonic wall thickening just proximal distal. Differential considerations include diverticulitis with abscess versus perforated colonic malignancy. No significant diverticular disease is seen elsewhere in the colon, which increases concern for malignancy. 2. Perinephric edema and diminished renal excretion on delayed phase imaging suggesting underlying renal disease. 3. Hepatic granuloma.  No focal hepatic mass. 4.  Aortic Atherosclerosis (ICD10-I70.0). These results were called by telephone at the time of  interpretation on 09/15/2017 at 11:25 pm to Dr. Thomasene Lot , who verbally acknowledged these results.      11/08/2017 Imaging    CT AP W Contrast 11/08/17  IMPRESSION: 1. The position of the left abdominal percutaneous pigtail catheter appears stable since October. No residual abscess but continued and perhaps progressive circumferential colonic wall thickening and mesenteric inflammation near the catheter. 2. Increased low-density stool distending the upstream left colon and splenic flexure raising the possibility of partial obstruction.  3. Consider Colonoscopy: The persistent colonic wall thickening in #1 may be inflammatory but colonic tumor is difficult to exclude. 4. New since October small to moderate size loculated left pleural effusion. Associated lung base atelectasis without definite pneumonia.       11/14/2017 Imaging    CT AP WO Contrast 11/14/17  IMPRESSION: 1. New pneumoperitoneum compared to the prior examination, compatible with perforated viscus. The exact source of this is uncertain, but this is suspected be related to gas traversing the chronic diverticular abscess wall at the site of the indwelling catheter. This was discussed by phone with Dr. Romana Juniper. 2. Diffuse body wall edema, mesenteric edema and retroperitoneal edema with bilateral pleural effusions, suspicious for a state of anasarca. 3. Multiple nonobstructive calculi in the collecting systems of both kidneys measuring up to 8 mm in the lower pole collecting system of the right kidney. There is also some new high attenuation material lying dependently in the large right bladder diverticulum. This could represent multiple tiny calculi, but this is unlikely given the development compared to the recent prior examination. Rather, this is favored to represent some proteinaceous/hemorrhagic debris or residual contrast material from prior CT scan 11/08/2017. 4. Aortic atherosclerosis. 5. Additional  findings, as above. Aortic Atherosclerosis (ICD10-I70.0).      11/14/2017 Surgery  EXPLORATORY LAPAROTOMY WITH HARTMANN PROCEDURE by Dr. Windle Guard and Dr. Harlow Asa 11/14/17       11/14/2017 Pathology Results    Diagnosis 11/14/17  Colon, segmental resection for tumor, Descending and Sigmoid - INVASIVE ADENOCARCINOMA, MODERATELY DIFFERENTIATED, SPANNING 4.3 CM. - ADENOCARCINOMA IS INVOLVED WITH TRANSMURAL DEFECT AND IS AT INKED SEROSAL EDGE. - METASTATIC CARCINOMA IN 2 OF 5 LYMPH NODES (2/5) WITH EXTRACAPSULAR EXTENSION. - MULTIPLE SOFT TISSUE DEPOSITS. - THE PROXIMAL AND DISTAL RESECTION MARGINS ARE NEGATIVE FOR ADENOCARCINOMA. - SEE ONCOLOGY TABLE BELOW.      12/06/2017 Initial Diagnosis    Cancer of left colon ([place])      05/02/2018 PET scan    IMPRESSION: Diffuse liver metastases.  Increased hypermetabolic left upper and lower quadrant lymphadenopathy, consistent with metastatic disease.  Increased concentric wall thickening of distal descending colon, which is hypermetabolic and suspicious for malignancy.  Mild decrease in size of hypermetabolic soft tissue nodule in the left paracolic gutter, suspicious for malignancy.  Mild increase in size of left lower quadrant subcutaneous soft tissue nodule near colostomy site, which shows mild hypermetabolism. This could be reactive in etiology, although a soft tissue metastasis cannot be excluded.  No evidence of metastatic disease within the chest or neck.      HISTORY OF PRESENTING ILLNESS by Lucianne Lei 02/10/18:  Roberto Owen 83 y.o. male who presents to clinic today in follow-up of a consultation with Dr. Burr Medico from 11/19/2017.  Mr. Cull' recent history began in August 2018 when he traveled to [place] from [place] to visit family and to see his grandson play football.  He presented to the emergency room at [facility] on 07/23/2017 after having a syncopal episode.  His labs returned showing a hemoglobin of 6.2,  hematocrit of 23.1, MCV of 60 and ferritin of 4.  Stools were guaiac positive.  He acknowledged that he had been taking BC and Goody's powders for an extended period.  He was transfused with packed red blood cells.  Additionally he was taken for an upper endoscopy on 07/25/2017  [phone]) which showed chronic active gastritis with H. pylori.  No metaplasia dysplasia or malignancy was noted.  He was given 510 mg of parenteral iron while hospitalized and was restarted on oral iron.  He reported a GI bleed in around 2012 while in [place].  He was admitted to the hospital and transfused with packed red blood cells at that time and started on oral iron.  Mr. Cafaro was admitted to [facility] again on 09/16/2017 after presenting to the emergency room with a 4-5-day history of abdominal pain, nausea, and loss of appetite.  His abdominal pain was localized in the left lower quadrant.  He had not had a bowel movement for 3-4 days.  A CT scan of his abdomen and pelvis was completed and showed a large inflammatory pericolonic ill-defined heterogeneous collection arising from the distal descending colon and containing air, fluid, and possible stool.  Colonic wall thickening was noted just proximal and distal to this lesion.  Mr. Mian was admitted for IV antibiotics and for a consultation with interventional radiology for placement of a percutaneous drain.  A percutaneous drain was placed under CT guidance on 09/16/2017 with 50 cc of material drained.  Cultures were collected and returned inconclusive.  A repeat CT scan completed on 09/20/2017 showed significant improvement.  The patient was discharged home on oral antibiotics with a percutaneous drain in place.  Mr. Mussell was next seen in the emergency room on 10/23/2017  when he presented with 1 week of dark discharge from his percutaneous drain and around the drain insertion site.  Recommendations were for placement of a larger percutaneous drain.  Mr.  Gallon was evaluated and discharged home.  A new 14 French drain was placed on 10/28/2017.  Mr. Calico again presented to the emergency room on 11/08/2017 when his percutaneous drain stopped producing output and he noted a 4-day history of loss of appetite, GI spasms with pickups, nausea, and vomiting.  On this admission he was found to have new onset atrial fibrillation with RVR.  Cardiology was consulted.  He was begun on a diltiazem drip with poor control of his A. fib.  He was next begun on an amiodarone drip.  He was well controlled on amiodarone.  Diltiazem was discontinued and he was converted to oral amiodarone.  He was found to have a left loculated effusion from a presumed pneumonia.  Interventional radiology was consulted with a drain placed.  Patient became bradycardic with altered mental status and was found to have a pneumoperitoneum on the KUB.  He was taken for emergent surgery at which time a Hartman's procedure was performed on 11/14/2017.  He was given a colostomy and had a colon resection.  His pathology ([id number]) returned showing a pT4a, pN1b colorectal carcinoma with 2 of 5 lymph nodes positive for metastatic disease.  He was treated with vancomycin for 10 days and Zosyn for 14 days postoperatively.  During his period of recovery he was found to have acute kidney injury due to poor oral hydration.  Mr. Wagley was ultimately discharged to rehab on 11/26/2017.  During his last hospitalization he was seen by Dr. Burr Medico in consultation on 11/19/2017.  CURRENT THERAPY: Observation   INTERVAL HISTORY:  Roberto Owen is here for follow up for her colon cancer. He presents to the clinic today accompanied with his granddaughter. He notes he is doing well and being active. 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 He notes he would have someone there to help him. He notes if he needs to take his treatment elsewhere, like in [place], he now has medical records from our  treatment. He opted to proceed with treatment with Oral Xeloda once he returns form his trip and then he will decide if he should also proceed with his biopsy. He wears belt so he can protect his colostomy bag.     REVIEW OF SYSTEMS:   Constitutional: Denies fevers, chills or abnormal weight loss, good energy  Eyes: Denies blurriness of vision Ears, nose, mouth, throat, and face: Denies mucositis or sore throat Respiratory: Denies cough, dyspnea or wheezes Cardiovascular: Denies palpitation, chest discomfort  Gastrointestinal:  Denies nausea, heartburn or change in bowel habits Skin: Denies abnormal skin rashes Lymphatics: Denies new lymphadenopathy or easy bruising Neurological:Denies numbness, tingling or new weaknesses Behavioral/Psych: Mood is stable, no new changes  All other systems were reviewed with the patient and are negative.  MEDICAL HISTORY:  Past Medical History:  Diagnosis Date  . Anemia   . Bladder diverticulum 11/10/2017  . Esophageal ulcer    hx/notes 09/15/2017  . Gastric ulcer    hx/notes 09/15/2017  . GI bleed   . History of blood transfusion 07/2017  . NSAID induced gastritis     SURGICAL HISTORY: Past Surgical History:  Procedure Laterality Date  . COLONOSCOPY  ~ 05/2016  . ESOPHAGOGASTRODUODENOSCOPY (EGD) WITH PROPOFOL N/A 07/25/2017   Procedure: ESOPHAGOGASTRODUODENOSCOPY (EGD) WITH PROPOFOL;  Surgeon:  Mauri Pole, MD;  Location: [facility];  Service: Endoscopy;  Laterality: N/A;  . IR CATHETER TUBE CHANGE  10/28/2017  . IR RADIOLOGIST EVAL & MGMT  09/30/2017  . IR RADIOLOGIST EVAL & MGMT  10/19/2017  . LAPAROTOMY N/A 11/14/2017   Procedure: EXPLORATORY LAPAROTOMY WITH HARTMANN PROCEDURE;  Surgeon: Clovis Riley, MD;  Location: [facility];  Service: General;  Laterality: N/A;    I have reviewed the social history and family history with the patient and they are unchanged from previous note.  ALLERGIES:  has No Known Allergies.  MEDICATIONS:    Current Outpatient Medications  Medication Sig Dispense Refill  . acetaminophen (TYLENOL) 500 MG tablet Take 1,000 mg by mouth every 6 (six) hours as needed for mild pain.    Marland Kitchen amiodarone (PACERONE) 200 MG tablet Take 1 tablet (200 mg total) by mouth daily. 90 tablet 0  . apixaban (ELIQUIS) 2.5 MG TABS tablet Take 1 tablet (2.5 mg total) by mouth 2 (two) times daily. 60 tablet 0  . ferrous sulfate 325 (65 FE) MG tablet Take 1 tablet (325 mg total) by mouth daily with breakfast. 30 tablet 5  . furosemide (LASIX) 80 MG tablet Take 1 tablet (80 mg total) by mouth 2 (two) times daily. 60 tablet 2  . metoprolol tartrate (LOPRESSOR) 25 MG tablet Take 0.5 tablets (12.5 mg total) by mouth 2 (two) times daily. 90 tablet 1  . pantoprazole (PROTONIX) 40 MG tablet Take 1 tablet (40 mg total) by mouth daily. 180 tablet 2  . Simethicone 180 MG CAPS Take 1 capsule every 4 (four) hours as needed by mouth (for gas, bloating).     No current facility-administered medications for this visit.     PHYSICAL EXAMINATION: ECOG PERFORMANCE STATUS: 1-2  Vitals:   05/11/18 1316  BP: (!) 143/67  Pulse: 80  Resp: 18  Temp: 97.9 F (36.6 C)  SpO2: 100%   Filed Weights   05/11/18 1316  Weight: 215 lb 11.2 oz (97.8 kg)    GENERAL:alert, no distress and comfortable SKIN: skin color, texture, turgor are normal, no rashes or significant lesions EYES: normal, Conjunctiva are pink and non-injected, sclera clear OROPHARYNX:no exudate, no erythema and lips, buccal mucosa, and tongue normal  NECK: supple, thyroid normal size, non-tender, without nodularity LYMPH:  no palpable lymphadenopathy in the cervical, axillary or inguinal LUNGS: clear to auscultation and percussion with normal breathing effort HEART: regular rate & rhythm and no murmurs and no lower extremity edema ABDOMEN:abdomen soft, non-tender and normal bowel sounds (+) surgical scar healed well with little scar tissue (+) colostomy bag placed, clean  with no discharge  Musculoskeletal:no cyanosis of digits and no clubbing  NEURO: alert & oriented x 3 with fluent speech, no focal motor/sensory deficits  LABORATORY DATA:  I have reviewed the data as listed CBC Latest Ref Rng & Units 05/11/2018 04/18/2018 03/10/2018  WBC 4.0 - 10.3 K/uL 6.2 6.6 6.0  Hemoglobin 13.0 - 17.1 g/dL 9.7(L) 10.4(L) 8.7(L)  Hematocrit 38.4 - 49.9 % 30.1(L) 32.7(L) 28.1(L)  Platelets 140 - 400 K/uL 285 297 336     CMP Latest Ref Rng & Units 05/11/2018 04/18/2018 03/10/2018  Glucose 70 - 140 mg/dL 82 91 114(H)  BUN 7 - 26 mg/dL 39(H) 35(H) 31(H)  Creatinine 0.70 - 1.30 mg/dL 2.08(H) 1.61(H) 1.72(H)  Sodium 136 - 145 mmol/L 141 141 137  Potassium 3.5 - 5.1 mmol/L 3.6 3.7 3.6  Chloride 98 - 109 mmol/L 106 104 102  CO2  22 - 29 mmol/L _0 Calcium 8.4 - 10.4 mg/dL 9.1 9.4 8.3(L)  Total Protein 6.4 - 8.3 g/dL 8.8(H) 9.2(H) 7.8  Total Bilirubin 0.2 - 1.2 mg/dL 0.3 0.3 0.4  Alkaline Phos 40 - 150 U/L 109 95 74  AST 5 - 34 U/L _1 ALT 0 - 55 U/L 32 28 8(L)    RADIOGRAPHIC STUDIES: I have personally reviewed the radiological images as listed and agreed with the findings in the report.  PET 05/02/18 IMPRESSION: Diffuse liver metastases. Increased hypermetabolic left upper and lower quadrant lymphadenopathy, consistent with metastatic disease. Increased concentric wall thickening of distal descending colon, which is hypermetabolic and suspicious for malignancy. Mild decrease in size of hypermetabolic soft tissue nodule in the left paracolic gutter, suspicious for malignancy. Mild increase in size of left lower quadrant subcutaneous soft tissue nodule near colostomy site, which shows mild hypermetabolism. This could be reactive in etiology, although a soft tissue metastasis cannot be excluded. No evidence of metastatic disease within the chest or neck.  CT AP W Contrast 03/10/18 IMPRESSION: 1. Mild urinary bladder wall thickening which may be chronic,  or represent cystitis. Chronic posterior right bladder diverticulum containing hyperdense material, which may stone or complex material. 2. Two questionable low-density lesions in the right lobe of the liver, 16 and19 mm, not seen on prior exam. Borders are ill-defined, and this may simply represent focal fatty infiltration, however underlying hepatic lesion is also considered. Recommend further evaluation with MRI. Consider elective MRI after resolution of acute event, patient must be able to tolerate breath hold technique. 3. Well-defined small ovoid hyperdensity in the dependent pelvis, not seen on prior exams. This is likely sequela of prior surgery and chronic hematoma. No surrounding inflammation. 4. Absent renal excretion on delayed phase imaging consistent with diminished renal function. 5. Left lower quadrant colostomy without parastomal hernia. 6.  Aortic Atherosclerosis (ICD10-I70.0).  No results found.   ASSESSMENT & PLAN: 83 y.o.  African-American male, with   1. Adenocarcinoma of sigmoid colon, with perforation, pT4aN1cMx, s/p ex lap and colostomy(Hartmann's Procedure), MSI-S, diffuse liver and node mets in 04/2018 -I met Mr. Reynold in the hospital when he was initially diagnosed with colon cancer in November 2018. Due to the abscess and other complication related to the perforated colon cancer, he had prolonged hospital stay and rehab.    -We previously discussed his staging, the nature history of colon cancer, and the high risk of recurrence after surgery.  Due to the Stage IIIB ([id number]) disease and perforation before surgery, he is at very high risk for metastasis, especially peritoneal metastasis.  I encouraged him to consider adjuvant chemotherapy.   -Due to his advanced age, and prolonged recovery, he is not a candidate for intensive FOLFOX or CAPOX, but he probably would be able to tolerate single agent Xeloda.  The problem is his surgery was close to 3 months ago,  and we know no significant benefit of adjuvant chemo if it is given 3 months or more after surgery. We previously discussed the above with patient and his daughter in great details. Pt decided not to take Xeloda.  -He presented to the hospital on 03/10/18 with LLQ abdominal pain. He had a CT AP W Contrast that revealed 2 areas of concern in the liver.  This is concerning for liver metastasis. I recommend a PET Scan for further evaluation.  -We discussed his PET/CT scan from 05/02/18 which shows diffuse hypermetabolic liver lesions and abdominal adenopathy,  most consistent with metastasis.  -His CEA last month was already elevated at 11.98 which further supports this is likely from colon cancer.  -Given his advanced age, and the typical image findings, I do not think he needs biopsy to confirm. -I discussed his disease is no longer curable but is treatable. The goal of care is now palliative to control his disease and prolong his life.  -He is currently asymptomatic and doing well overall. I believe he is a candidate for palliative chemotherapy.  However he does have chronic kidney disease, which is worse lately, his treatment option will be limited.  -Patient is interested in treatment, but also values his quality of life. -For treatment I do not recommend most aggressive treatment given his age. I recommend oral Xeloda or IV 5-FU.  However due to his stage III chronic kidney disease, he is currently not a candidate for Xeloda.   -Patient has a planned trip to [place], he is leaving next week, and will return after 1 July. He would like to be treated here in [place], since his family is here.  -We discussed potential cancer related symptoms and he knows to go to local hospital if he develops symptoms during his trip. -I will see him back in mid July, when he returns I will likely repeat his lab and scan, and finalize his treatment plan. -I will request his surgical sample to be tested for foundation  one, to see if he is a candidate for immunotherapy or targeted therapy.  2. AF, leg edema, CKD stage III F/u with PCP and continue meds  -Cr worse lately, I strongly encouraged him to drink water adequately, to avoid dehydration.  3. Goal of care discussion  -We again discussed the incurable nature of his cancer, and the overall poor prognosis, especially if he does not have good response to chemotherapy or progress on chemo -The patient understands the goal of care is palliative.   PLAN:  -Lab today  -Lab and f/u in 2 months when he returns from his trip to [place].  -I will call his daughter to update his condition and prognosis      No orders of the defined types were placed in this encounter.  All questions were answered. The patient knows to call the clinic with any problems, questions or concerns. No barriers to learning was detected. I spent 25 minutes counseling the patient face to face. The total time spent in the appointment was 30 minutes and more than 50% was on counseling and review of test results  This document serves as a record of services personally performed by Truitt Merle, MD. It was created on her behalf by Joslyn Devon, a trained medical scribe. The creation of this record is based on the scribe's personal observations and the provider's statements to them.   I have reviewed the above documentation for accuracy and completeness, and I agree with the above.     Truitt Merle, MD 05/11/18

## 2018-05-11 ENCOUNTER — Inpatient Hospital Stay (HOSPITAL_BASED_OUTPATIENT_CLINIC_OR_DEPARTMENT_OTHER): Payer: Medicare Other | Admitting: Hematology

## 2018-05-11 ENCOUNTER — Telehealth: Payer: Self-pay | Admitting: Hematology

## 2018-05-11 ENCOUNTER — Encounter: Payer: Self-pay | Admitting: Hematology

## 2018-05-11 ENCOUNTER — Inpatient Hospital Stay: Payer: Medicare Other

## 2018-05-11 VITALS — BP 143/67 | HR 80 | Temp 97.9°F | Resp 18 | Ht 78.0 in | Wt 215.7 lb

## 2018-05-11 DIAGNOSIS — Z8711 Personal history of peptic ulcer disease: Secondary | ICD-10-CM

## 2018-05-11 DIAGNOSIS — C779 Secondary and unspecified malignant neoplasm of lymph node, unspecified: Secondary | ICD-10-CM | POA: Diagnosis not present

## 2018-05-11 DIAGNOSIS — I700 Atherosclerosis of aorta: Secondary | ICD-10-CM | POA: Diagnosis not present

## 2018-05-11 DIAGNOSIS — C186 Malignant neoplasm of descending colon: Secondary | ICD-10-CM

## 2018-05-11 DIAGNOSIS — Z79899 Other long term (current) drug therapy: Secondary | ICD-10-CM | POA: Diagnosis not present

## 2018-05-11 DIAGNOSIS — I129 Hypertensive chronic kidney disease with stage 1 through stage 4 chronic kidney disease, or unspecified chronic kidney disease: Secondary | ICD-10-CM | POA: Diagnosis not present

## 2018-05-11 DIAGNOSIS — N323 Diverticulum of bladder: Secondary | ICD-10-CM | POA: Diagnosis not present

## 2018-05-11 DIAGNOSIS — Z933 Colostomy status: Secondary | ICD-10-CM | POA: Diagnosis not present

## 2018-05-11 DIAGNOSIS — N183 Chronic kidney disease, stage 3 (moderate): Secondary | ICD-10-CM | POA: Diagnosis not present

## 2018-05-11 DIAGNOSIS — J90 Pleural effusion, not elsewhere classified: Secondary | ICD-10-CM | POA: Diagnosis not present

## 2018-05-11 DIAGNOSIS — D649 Anemia, unspecified: Secondary | ICD-10-CM | POA: Diagnosis not present

## 2018-05-11 DIAGNOSIS — C787 Secondary malignant neoplasm of liver and intrahepatic bile duct: Secondary | ICD-10-CM | POA: Diagnosis not present

## 2018-05-11 DIAGNOSIS — I4891 Unspecified atrial fibrillation: Secondary | ICD-10-CM

## 2018-05-11 LAB — CBC WITH DIFFERENTIAL (CANCER CENTER ONLY)
BASOS ABS: 0.1 10*3/uL (ref 0.0–0.1)
Basophils Relative: 1 %
EOS ABS: 0.2 10*3/uL (ref 0.0–0.5)
EOS PCT: 4 %
HCT: 30.1 % — ABNORMAL LOW (ref 38.4–49.9)
Hemoglobin: 9.7 g/dL — ABNORMAL LOW (ref 13.0–17.1)
Lymphocytes Relative: 16 %
Lymphs Abs: 1 10*3/uL (ref 0.9–3.3)
MCH: 25.8 pg — ABNORMAL LOW (ref 27.2–33.4)
MCHC: 32.2 g/dL (ref 32.0–36.0)
MCV: 80.3 fL (ref 79.3–98.0)
MONO ABS: 0.5 10*3/uL (ref 0.1–0.9)
MONOS PCT: 9 %
Neutro Abs: 4.4 10*3/uL (ref 1.5–6.5)
Neutrophils Relative %: 70 %
PLATELETS: 285 10*3/uL (ref 140–400)
RBC: 3.75 MIL/uL — ABNORMAL LOW (ref 4.20–5.82)
RDW: 15.2 % — AB (ref 11.0–14.6)
WBC Count: 6.2 10*3/uL (ref 4.0–10.3)

## 2018-05-11 LAB — CEA (IN HOUSE-CHCC): CEA (CHCC-In House): 24.3 ng/mL — ABNORMAL HIGH (ref 0.00–5.00)

## 2018-05-11 LAB — CMP (CANCER CENTER ONLY)
ALT: 32 U/L (ref 0–55)
ANION GAP: 9 (ref 3–11)
AST: 27 U/L (ref 5–34)
Albumin: 3.4 g/dL — ABNORMAL LOW (ref 3.5–5.0)
Alkaline Phosphatase: 109 U/L (ref 40–150)
BILIRUBIN TOTAL: 0.3 mg/dL (ref 0.2–1.2)
BUN: 39 mg/dL — ABNORMAL HIGH (ref 7–26)
CO2: 26 mmol/L (ref 22–29)
Calcium: 9.1 mg/dL (ref 8.4–10.4)
Chloride: 106 mmol/L (ref 98–109)
Creatinine: 2.08 mg/dL — ABNORMAL HIGH (ref 0.70–1.30)
GFR, EST AFRICAN AMERICAN: 33 mL/min — AB (ref >60)
GFR, EST NON AFRICAN AMERICAN: 28 mL/min — AB (ref >60)
Glucose, Bld: 82 mg/dL (ref 70–140)
POTASSIUM: 3.6 mmol/L (ref 3.5–5.1)
Sodium: 141 mmol/L (ref 136–145)
TOTAL PROTEIN: 8.8 g/dL — AB (ref 6.4–8.3)

## 2018-05-11 LAB — IRON AND TIBC
IRON: 36 ug/dL — AB (ref 42–163)
SATURATION RATIOS: 15 % — AB (ref 42–163)
TIBC: 238 ug/dL (ref 202–409)
UIBC: 201 ug/dL

## 2018-05-11 LAB — FERRITIN: Ferritin: 60 ng/mL (ref 22–316)

## 2018-05-11 NOTE — Telephone Encounter (Signed)
Appointments scheduled AVS/Calender printed per 5/22 los

## 2018-05-12 DIAGNOSIS — L89610 Pressure ulcer of right heel, unstageable: Secondary | ICD-10-CM | POA: Diagnosis not present

## 2018-05-12 DIAGNOSIS — I10 Essential (primary) hypertension: Secondary | ICD-10-CM | POA: Diagnosis not present

## 2018-05-12 DIAGNOSIS — C189 Malignant neoplasm of colon, unspecified: Secondary | ICD-10-CM | POA: Diagnosis not present

## 2018-05-12 DIAGNOSIS — Z433 Encounter for attention to colostomy: Secondary | ICD-10-CM | POA: Diagnosis not present

## 2018-05-12 DIAGNOSIS — L89620 Pressure ulcer of left heel, unstageable: Secondary | ICD-10-CM | POA: Diagnosis not present

## 2018-05-12 DIAGNOSIS — I482 Chronic atrial fibrillation: Secondary | ICD-10-CM | POA: Diagnosis not present

## 2018-05-17 DIAGNOSIS — Z433 Encounter for attention to colostomy: Secondary | ICD-10-CM | POA: Diagnosis not present

## 2018-05-17 DIAGNOSIS — L89620 Pressure ulcer of left heel, unstageable: Secondary | ICD-10-CM | POA: Diagnosis not present

## 2018-05-17 DIAGNOSIS — I10 Essential (primary) hypertension: Secondary | ICD-10-CM | POA: Diagnosis not present

## 2018-05-17 DIAGNOSIS — C189 Malignant neoplasm of colon, unspecified: Secondary | ICD-10-CM | POA: Diagnosis not present

## 2018-05-17 DIAGNOSIS — I482 Chronic atrial fibrillation: Secondary | ICD-10-CM | POA: Diagnosis not present

## 2018-05-17 DIAGNOSIS — L89610 Pressure ulcer of right heel, unstageable: Secondary | ICD-10-CM | POA: Diagnosis not present

## 2018-05-19 DIAGNOSIS — Z433 Encounter for attention to colostomy: Secondary | ICD-10-CM | POA: Diagnosis not present

## 2018-05-19 DIAGNOSIS — L89610 Pressure ulcer of right heel, unstageable: Secondary | ICD-10-CM | POA: Diagnosis not present

## 2018-05-19 DIAGNOSIS — C189 Malignant neoplasm of colon, unspecified: Secondary | ICD-10-CM | POA: Diagnosis not present

## 2018-05-19 DIAGNOSIS — I10 Essential (primary) hypertension: Secondary | ICD-10-CM | POA: Diagnosis not present

## 2018-05-19 DIAGNOSIS — I482 Chronic atrial fibrillation: Secondary | ICD-10-CM | POA: Diagnosis not present

## 2018-05-19 DIAGNOSIS — L89620 Pressure ulcer of left heel, unstageable: Secondary | ICD-10-CM | POA: Diagnosis not present

## 2018-05-26 ENCOUNTER — Telehealth: Payer: Self-pay | Admitting: *Deleted

## 2018-05-26 NOTE — Telephone Encounter (Signed)
"  Patient is running low of ostomy supplies he received from [facility].  What is Dr. Arlana Pouch fax number to send order request?  Dr. Garlan Fillers is his PCP however patient is in [place] I believe temporarily.  Dr.Lockamy is with [facility], [address]  Provided Family Medicine phone number [phone]) to help with this request.

## 2018-05-27 ENCOUNTER — Telehealth: Payer: Self-pay

## 2018-05-27 NOTE — Telephone Encounter (Signed)
Called patient per Cira Rue NP patient currently taking one of ferrous sulfate daily, instructed to take it 2 times daily with orange juice, patient verbalized an understanding.

## 2018-05-27 NOTE — Telephone Encounter (Signed)
-----   Message from Alla Feeling, NP sent at 05/27/2018 10:46 AM EDT ----- Please call the patient and see if he is taking iron supplement. If not, I recommend ferrous sulfate 325 mg 1 tab twice daily. Encourage him to drink with orange juice/Vitamin C for better absorption.  Thanks, Regan Rakers NP

## 2018-05-30 ENCOUNTER — Telehealth: Payer: Self-pay | Admitting: Hematology

## 2018-05-30 ENCOUNTER — Telehealth: Payer: Self-pay | Admitting: *Deleted

## 2018-05-30 NOTE — Telephone Encounter (Signed)
Faxed ROI to [facility]; release [phone]

## 2018-05-30 NOTE — Telephone Encounter (Signed)
I tried to call patient's daughter Donella Stade to update his condition from his last visit in late May.  Unfortunately she did not answer the phone, and her mailbox was full so I was not able to leave a message.  Truitt Merle  05/30/2018

## 2018-06-07 ENCOUNTER — Telehealth: Payer: Self-pay

## 2018-06-07 NOTE — Telephone Encounter (Signed)
Verbal order given for ostomy supplies. ([email or web address])

## 2018-07-12 ENCOUNTER — Telehealth: Payer: Self-pay | Admitting: Hematology

## 2018-07-12 ENCOUNTER — Telehealth: Payer: Self-pay

## 2018-07-12 ENCOUNTER — Ambulatory Visit: Payer: Medicare Other | Admitting: Hematology

## 2018-07-12 ENCOUNTER — Other Ambulatory Visit: Payer: Medicare Other

## 2018-07-12 NOTE — Telephone Encounter (Signed)
Spoke with patient's daughter she states that the patient has moved back to [place]. Only plans to come to [place] for visits.  Emphasized the importance of him being followed medically.  She states she will contact him and ask that he call back.

## 2018-07-12 NOTE — Telephone Encounter (Signed)
Tried to reach patient his mailbox was not set up

## 2018-11-10 ENCOUNTER — Other Ambulatory Visit (HOSPITAL_COMMUNITY)
Admission: RE | Admit: 2018-11-10 | Discharge: 2018-11-10 | Disposition: A | Discharge status: Home / Self Care | Payer: Medicare Other | Source: Ambulatory Visit | Attending: Anatomic Pathology & Clinical Pathology | Admitting: Anatomic Pathology & Clinical Pathology

## 2018-11-10 DIAGNOSIS — C189 Malignant neoplasm of colon, unspecified: Secondary | ICD-10-CM | POA: Diagnosis present

## 2018-11-23 ENCOUNTER — Encounter (HOSPITAL_COMMUNITY): Payer: Self-pay

## 2018-12-01 ENCOUNTER — Encounter (HOSPITAL_COMMUNITY): Payer: Self-pay

## 2018-12-07 ENCOUNTER — Encounter (HOSPITAL_COMMUNITY): Payer: Self-pay

## 2019-01-04 ENCOUNTER — Telehealth: Payer: Self-pay | Admitting: *Deleted

## 2019-01-04 NOTE — Telephone Encounter (Signed)
Medical records faxed to [facility]; RID [id number]

## 2019-01-21 DEATH — deceased

## 2019-04-24 ENCOUNTER — Telehealth: Payer: Self-pay

## 2019-04-24 NOTE — Telephone Encounter (Signed)
Received fax from [facility] for ostomy supplies, faxed back with a note that this patient now lives in [place] and has not been seen by Dr. Burr Medico in a year.  Instructed to contact his local provider for approval.

## 2019-05-18 IMAGING — RF DG SINUS / FISTULA TRACT / ABSCESSOGRAM
3 series · 7 of 7 positions shown · non-contrast
Comparison: none

INDICATION: Diverticulitis with abscess at the level of the lower descending/
proximal sigmoid colon and status post percutaneous catheter
drainage of diverticular abscess on 09/16/2017.

[Series 1: one shot · 1 of 1 slices shown (1 of 2)]
[im 1/1]
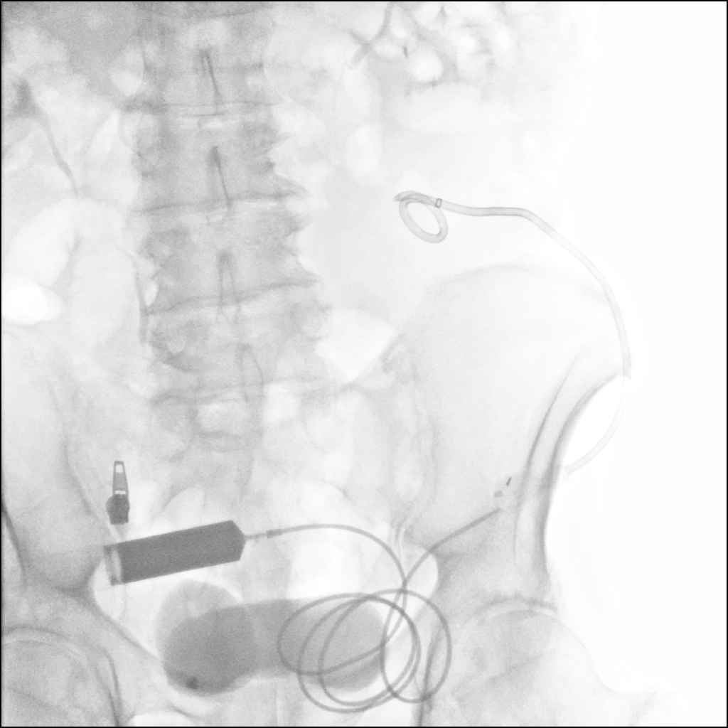

[Series 2: sequence · 4 of 53 frames shown]
[frame 8/53]
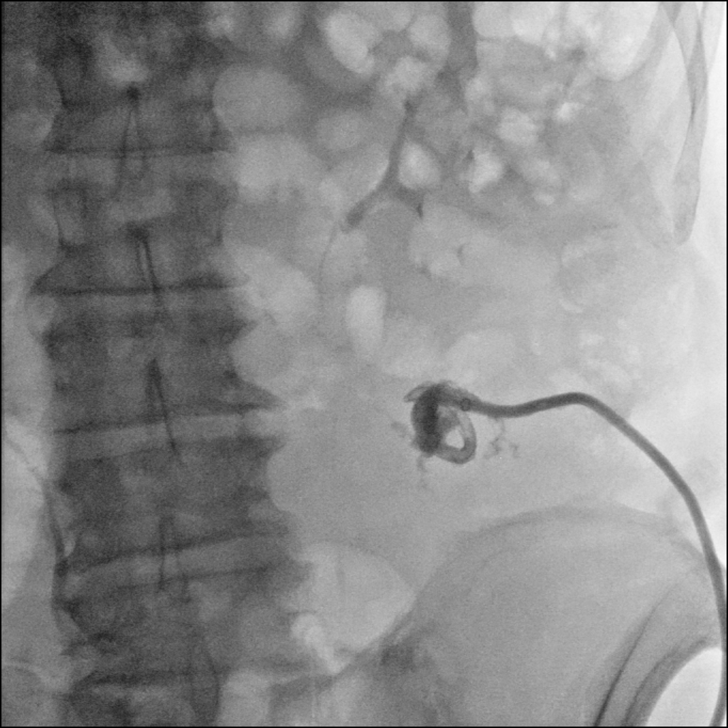
[frame 27/53]
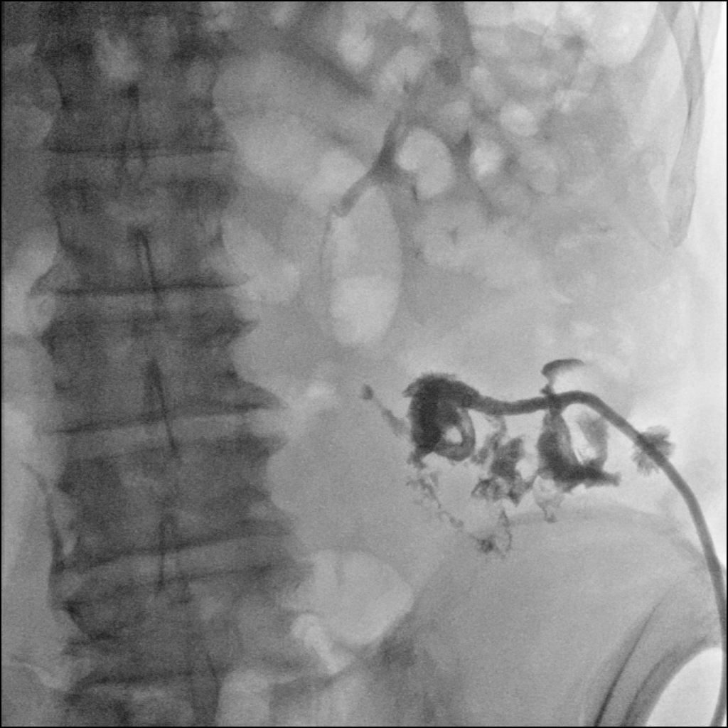
[frame 33/53]
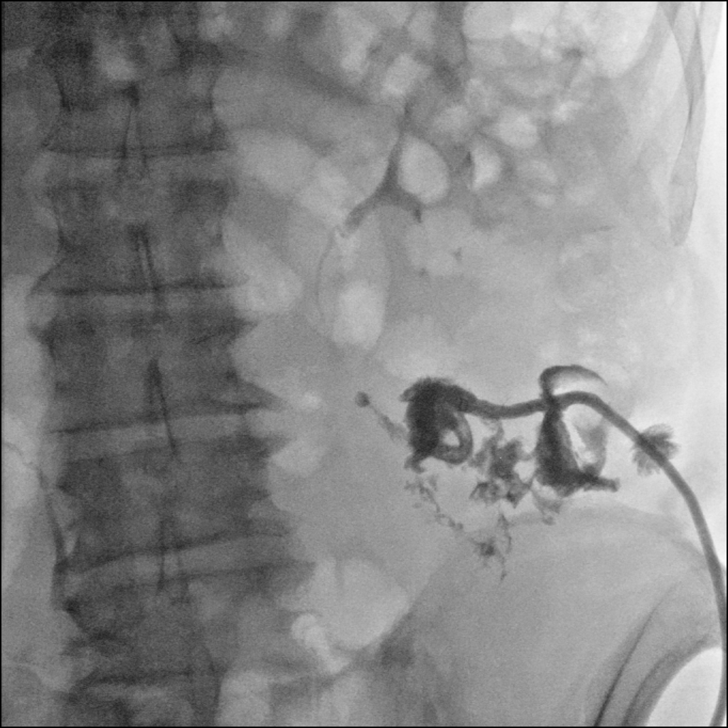
[frame 46/53]
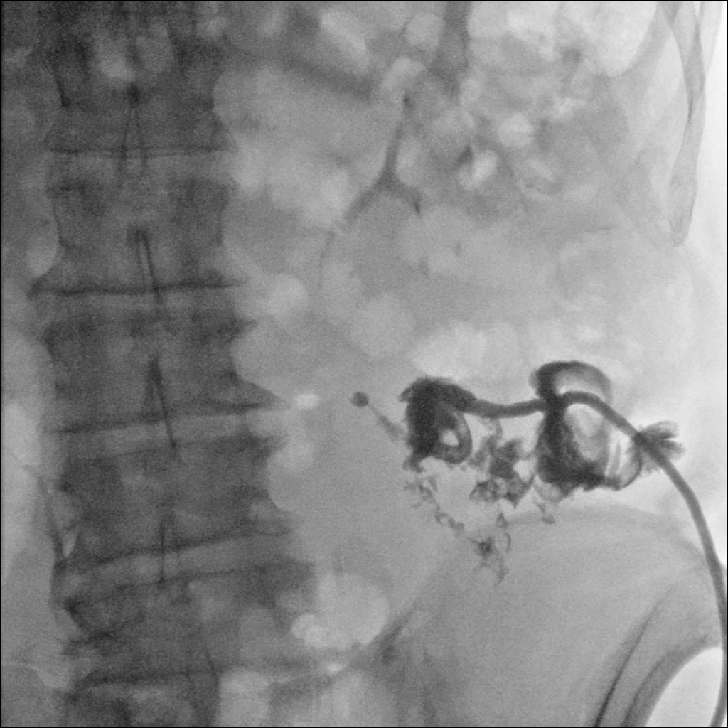

[Series 3: one shot · 2 of 2 slices shown (2 of 2)]
[im 1/2]
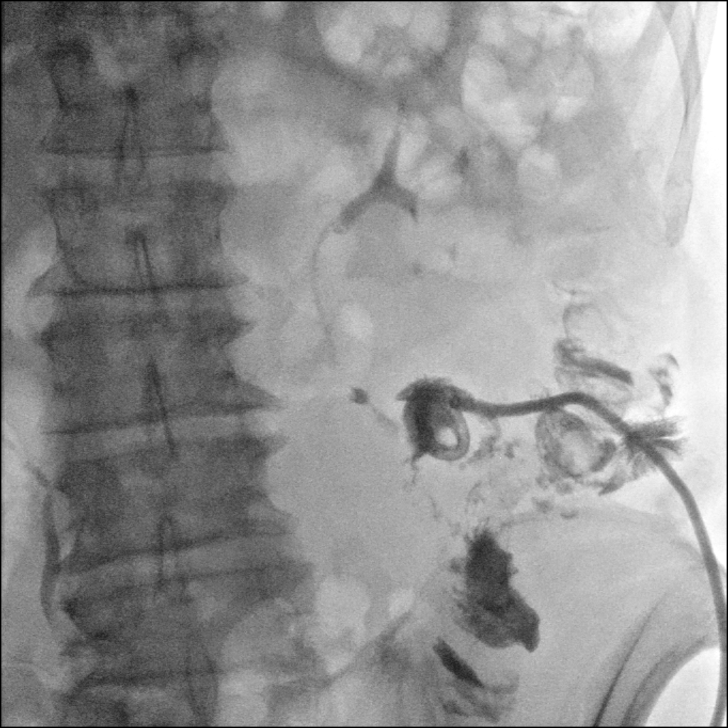
[im 2/2]
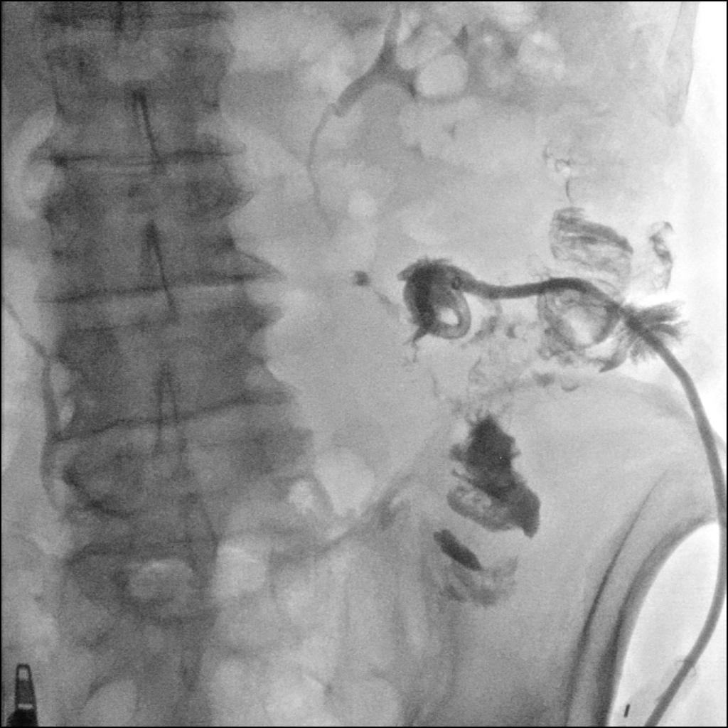

[7 of 7 positions shown; findings below may reference images not displayed]

EXAM:
INJECTION OF INDWELLING ABSCESS DRAINAGE CATHETER UNDER FLUOROSCOPY

MEDICATIONS:
No medications administered.

ANESTHESIA/SEDATION:
No sedation administered.

COMPLICATIONS:
None immediate.

CONTRAST:  15 mL Omnipaque 300

FLUOROSCOPY TIME:  18 seconds.  31 mGy.

PROCEDURE:
Initial fluoroscopy was performed of catheter position prior to
injection of contrast. Contrast injection was then performed via the
indwelling drainage catheter. Fluoroscopic cine loop and spot images
were saved. The catheter was then reconnected to a new gravity
drainage bag.
FINDINGS: With initial injection of the drain, there is filling of a
collapsed, small irregular abscess cavity. There then is filling of
both superior and inferior fistulas at the juncture of the lower
descending and proximal sigmoid colon which opacify the colonic
lumen. The more superior fistula appears larger. At the level of the
fistulas, there is visible narrowing of the colonic lumen. The
stricture is not obstructive, however.
IMPRESSION: Abscess catheter injection demonstrates communicating fistulas to
the adjacent colon at the level of the lower descending/sigmoid
junction. Two separate fistulas are defined with a larger superior
and smaller inferior fistula bracketing a segment of narrowing of
the colon. Although this may be reflective of diverticular disease,
perforated neoplasm of the colon is not excluded based on this
appearance. The drainage catheter will be left in place to gravity
drainage.

## 2019-06-15 IMAGING — XA IR CATHETER TUBE CHANGE
3 series · 7 of 7 positions shown · non-contrast
Comparison: none

INDICATION: 82-year-old male with a history of left lower quadrant abscess
secondary to diverticular disease. Drain placed 09/16/2017.

[Series 1: fl (-) angio · 1 of 1 slices shown (1 of 3)]
[im 1/1]
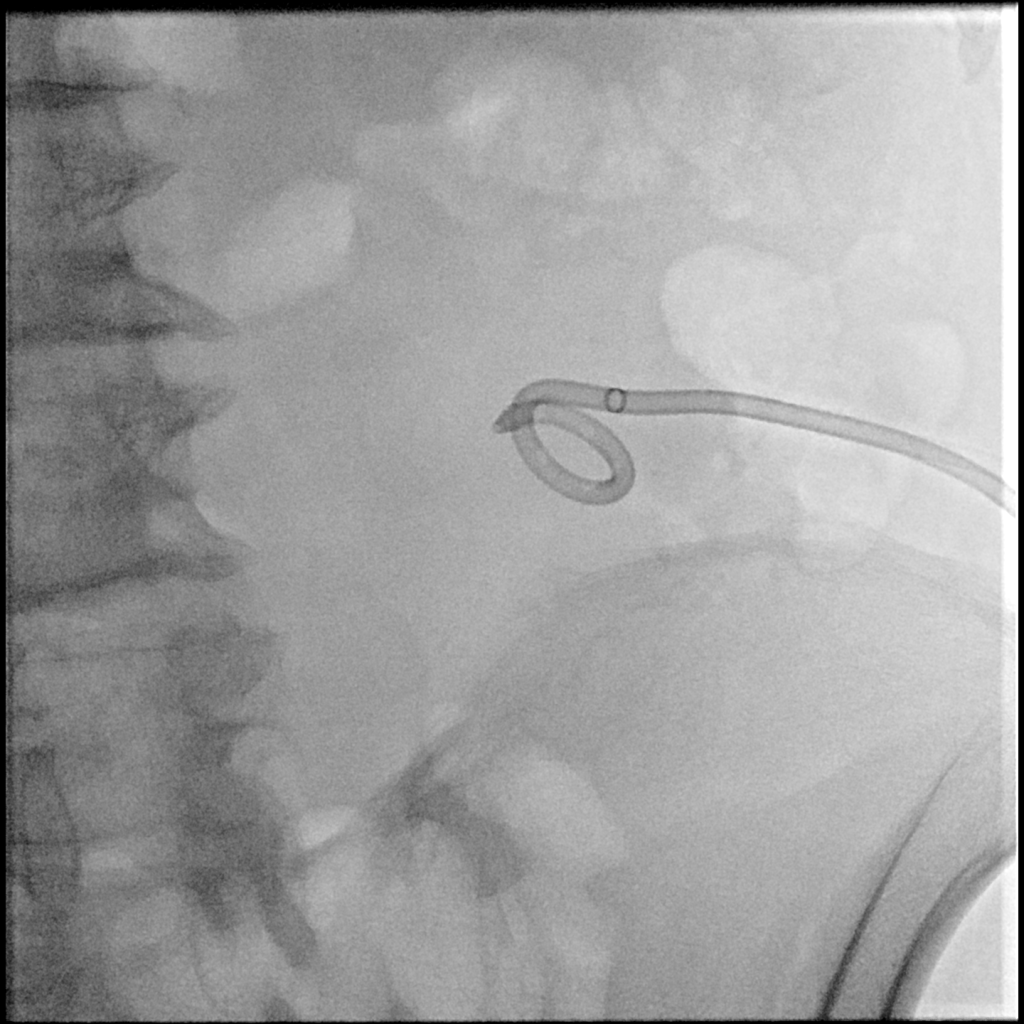

[Series 2: fl (-) angio · 2 acquisitions, 4 frames shown (2 of 3)]
[im 1/2]
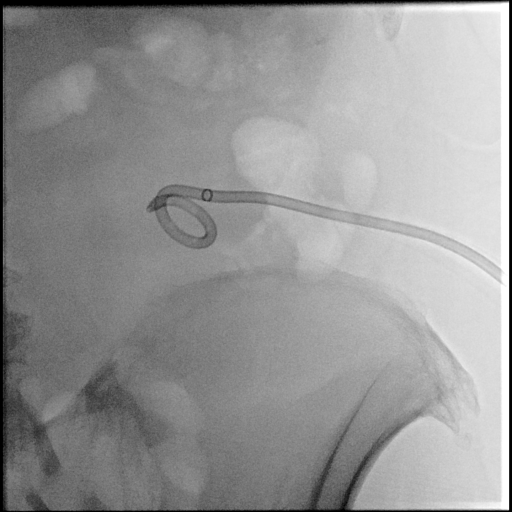
[im 1/2]
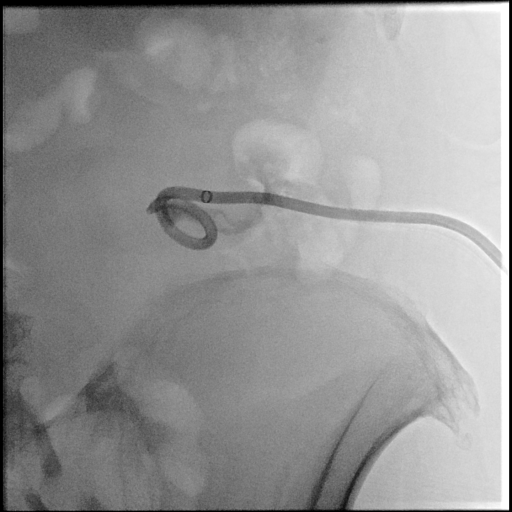
[im 1/2]
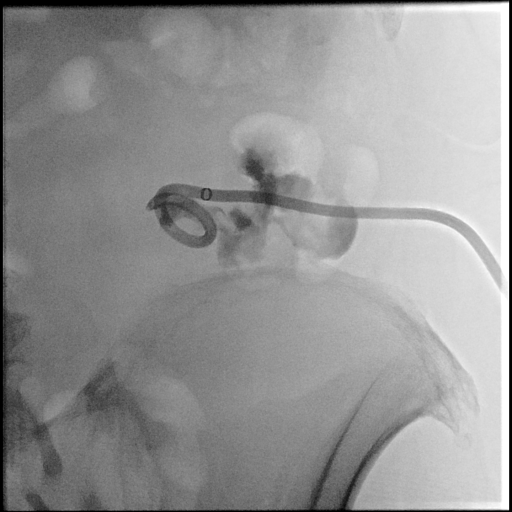
[im 2/2]
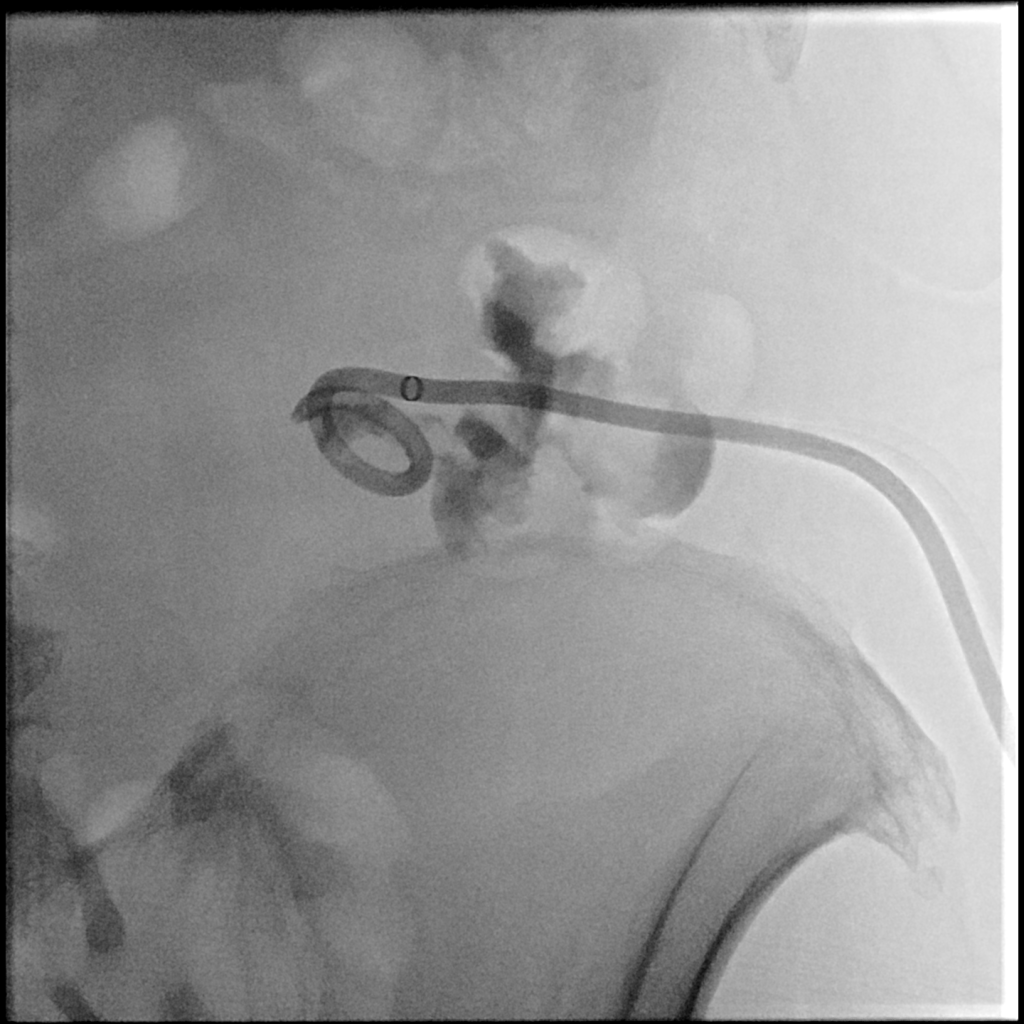

[Series 3: fl (-) angio · 2 of 2 slices shown (3 of 3)]
[im 1/2]
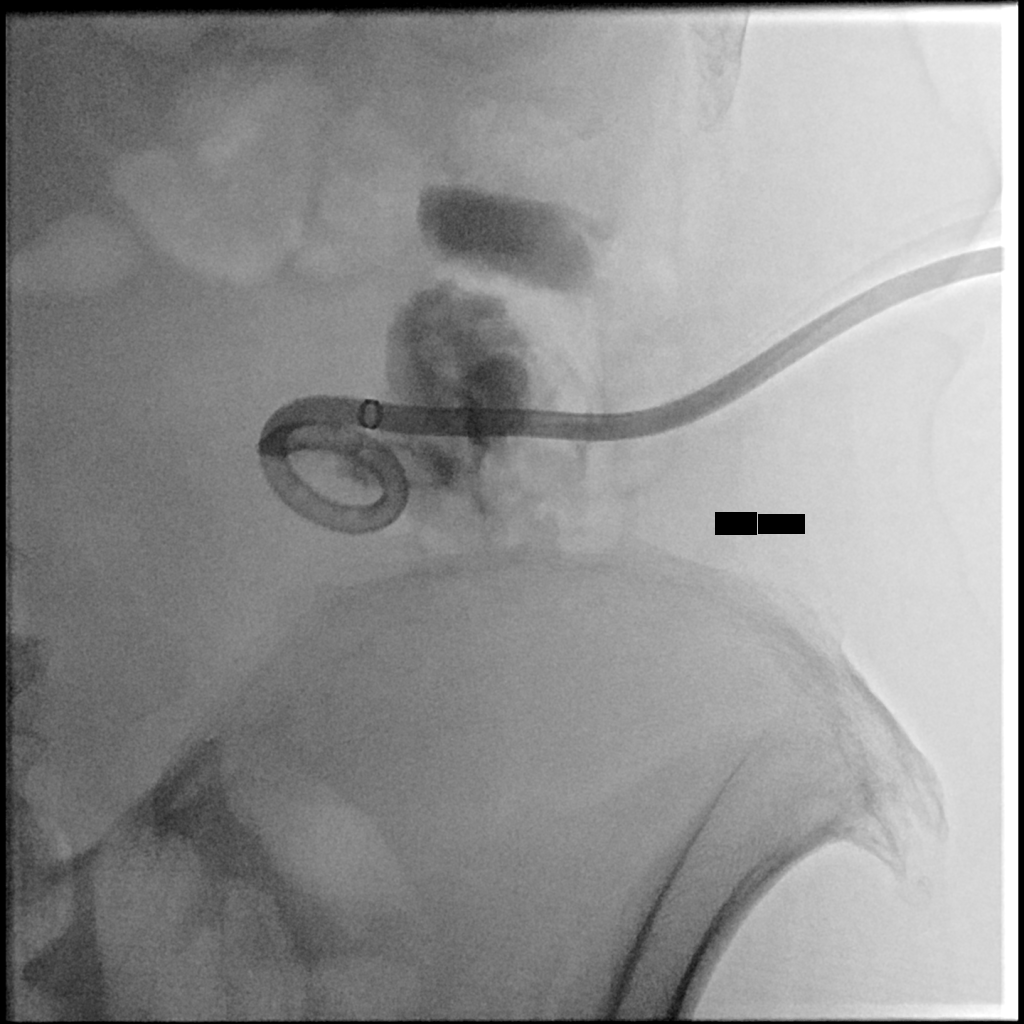
[im 2/2]
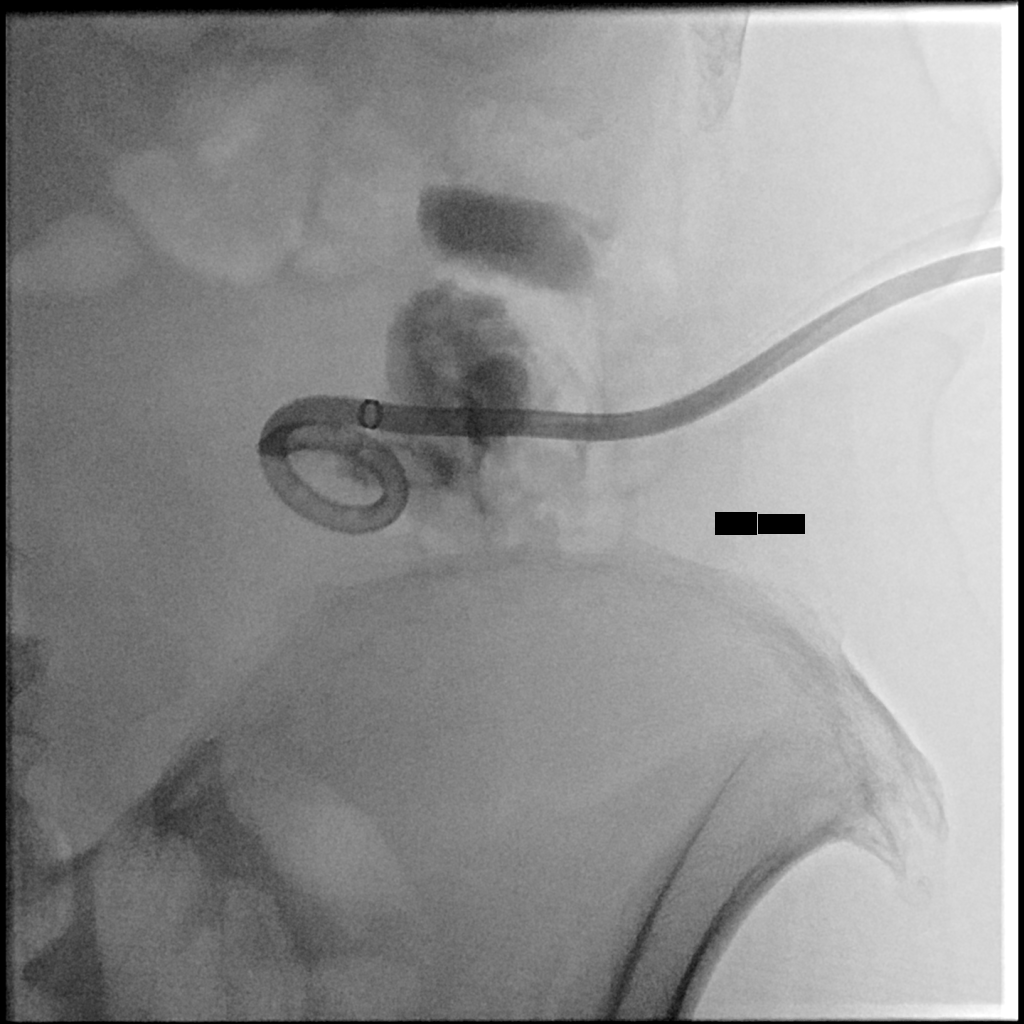

[7 of 7 positions shown; findings below may reference images not displayed]

The patient has had feculent drainage, with fluoro guided injection
demonstrating fistula to the colon.

Current drain has become occluded.

EXAM:
IR CATHETER TUBE CHANGE

MEDICATIONS:
None

ANESTHESIA/SEDATION:
None

COMPLICATIONS:
None

PROCEDURE:
Informed written consent was obtained from the patient after a
thorough discussion of the procedural risks, benefits and
alternatives. All questions were addressed. Maximal Sterile Barrier
Technique was utilized including caps, mask, sterile gowns, sterile
gloves, sterile drape, hand hygiene and skin antiseptic. A timeout
was performed prior to the initiation of the procedure.

Patient positioned supine position on the fluoroscopy table. The
left lower quadrant and drain were prepped and draped in the usual
sterile fashion.

1% lidocaine was used for local anesthesia.

Attempted drain flush proved blockage of the tube. Small amount
contrast entered the cavity, confirming persisting fistula.

Modified Seldinger technique was used to place a new 14 French
drain.

Drain was sutured in position.

Drain attached to gravity drainage.

Patient tolerated the procedure well and remained hemodynamically
stable throughout.

No complications were encountered and no significant blood loss.
IMPRESSION: Status post upsized and exchange for a new 14 French drain into a
abscess cavity of the left lower quadrant.

Injection confirms persisting fistula to the colon.
